# Patient Record
Sex: Female | Born: 1960
Health system: Southern US, Community
[De-identification: ages and names within clinical notes are randomized; demographics above are authoritative.]

## PROBLEM LIST (undated history)

## (undated) DIAGNOSIS — Z4542 Encounter for adjustment and management of neuropacemaker (brain) (peripheral nerve) (spinal cord): Secondary | ICD-10-CM

## (undated) DIAGNOSIS — T7840XA Allergy, unspecified, initial encounter: Secondary | ICD-10-CM

## (undated) DIAGNOSIS — R42 Dizziness and giddiness: Secondary | ICD-10-CM

## (undated) DIAGNOSIS — I10 Essential (primary) hypertension: Secondary | ICD-10-CM

## (undated) DIAGNOSIS — Z972 Presence of dental prosthetic device (complete) (partial): Secondary | ICD-10-CM

## (undated) DIAGNOSIS — K219 Gastro-esophageal reflux disease without esophagitis: Secondary | ICD-10-CM

## (undated) DIAGNOSIS — D649 Anemia, unspecified: Secondary | ICD-10-CM

## (undated) DIAGNOSIS — E119 Type 2 diabetes mellitus without complications: Secondary | ICD-10-CM

## (undated) DIAGNOSIS — F419 Anxiety disorder, unspecified: Secondary | ICD-10-CM

## (undated) DIAGNOSIS — G4730 Sleep apnea, unspecified: Secondary | ICD-10-CM

## (undated) DIAGNOSIS — I2699 Other pulmonary embolism without acute cor pulmonale: Secondary | ICD-10-CM

## (undated) DIAGNOSIS — Z9689 Presence of other specified functional implants: Secondary | ICD-10-CM

## (undated) DIAGNOSIS — F32A Depression, unspecified: Secondary | ICD-10-CM

## (undated) DIAGNOSIS — G4700 Insomnia, unspecified: Secondary | ICD-10-CM

## (undated) DIAGNOSIS — E785 Hyperlipidemia, unspecified: Secondary | ICD-10-CM

## (undated) DIAGNOSIS — I509 Heart failure, unspecified: Secondary | ICD-10-CM

## (undated) DIAGNOSIS — I219 Acute myocardial infarction, unspecified: Secondary | ICD-10-CM

## (undated) DIAGNOSIS — I639 Cerebral infarction, unspecified: Secondary | ICD-10-CM

## (undated) DIAGNOSIS — M1990 Unspecified osteoarthritis, unspecified site: Secondary | ICD-10-CM

## (undated) DIAGNOSIS — K279 Peptic ulcer, site unspecified, unspecified as acute or chronic, without hemorrhage or perforation: Secondary | ICD-10-CM

## (undated) DIAGNOSIS — J449 Chronic obstructive pulmonary disease, unspecified: Secondary | ICD-10-CM

## (undated) HISTORY — PX: REPLACEMENT TOTAL KNEE: SUR1224

## (undated) HISTORY — PX: ABDOMINAL HYSTERECTOMY: SHX81

## (undated) HISTORY — DX: Chronic obstructive pulmonary disease, unspecified: J44.9

## (undated) HISTORY — DX: Gastro-esophageal reflux disease without esophagitis: K21.9

## (undated) HISTORY — DX: Cerebral infarction, unspecified: I63.9

## (undated) HISTORY — DX: Sleep apnea, unspecified: G47.30

## (undated) HISTORY — DX: Acute myocardial infarction, unspecified: I21.9

## (undated) HISTORY — DX: Type 2 diabetes mellitus without complications: E11.9

## (undated) HISTORY — DX: Allergy, unspecified, initial encounter: T78.40XA

## (undated) HISTORY — PX: SPINAL CORD STIMULATOR INSERTION: SHX5378

## (undated) HISTORY — DX: Essential (primary) hypertension: I10

## (undated) HISTORY — PX: HERNIA REPAIR: SHX51

## (undated) HISTORY — PX: GALLBLADDER SURGERY: SHX652

## (undated) HISTORY — DX: Hyperlipidemia, unspecified: E78.5

## (undated) HISTORY — PX: ROTATOR CUFF REPAIR: SHX139

## (undated) HISTORY — PX: GASTRIC BYPASS: SHX52

## (undated) HISTORY — PX: OTHER SURGICAL HISTORY: SHX169

---

## 1993-11-27 HISTORY — PX: JOINT REPLACEMENT: SHX530

## 2000-11-27 DIAGNOSIS — I219 Acute myocardial infarction, unspecified: Secondary | ICD-10-CM

## 2000-11-27 HISTORY — DX: Acute myocardial infarction, unspecified: I21.9

## 2002-11-27 DIAGNOSIS — I639 Cerebral infarction, unspecified: Secondary | ICD-10-CM

## 2002-11-27 HISTORY — DX: Cerebral infarction, unspecified: I63.9

## 2004-11-21 ENCOUNTER — Emergency Department: Payer: Self-pay | Admitting: General Practice

## 2006-05-03 ENCOUNTER — Ambulatory Visit: Payer: Self-pay | Admitting: Pain Medicine

## 2006-05-14 ENCOUNTER — Ambulatory Visit: Payer: Self-pay | Admitting: Pain Medicine

## 2006-05-16 ENCOUNTER — Ambulatory Visit: Payer: Self-pay | Admitting: Pain Medicine

## 2006-06-06 ENCOUNTER — Ambulatory Visit: Payer: Self-pay | Admitting: Pain Medicine

## 2006-06-09 ENCOUNTER — Emergency Department: Payer: Self-pay | Admitting: Internal Medicine

## 2006-06-21 ENCOUNTER — Ambulatory Visit: Payer: Self-pay | Admitting: Pain Medicine

## 2006-06-27 ENCOUNTER — Ambulatory Visit: Payer: Self-pay | Admitting: Pain Medicine

## 2006-07-03 ENCOUNTER — Other Ambulatory Visit: Payer: Self-pay

## 2006-07-10 ENCOUNTER — Inpatient Hospital Stay: Payer: Self-pay | Admitting: Unknown Physician Specialty

## 2006-07-23 ENCOUNTER — Ambulatory Visit: Payer: Self-pay | Admitting: Unknown Physician Specialty

## 2006-07-24 ENCOUNTER — Other Ambulatory Visit: Payer: Self-pay

## 2006-07-25 ENCOUNTER — Inpatient Hospital Stay: Payer: Self-pay | Admitting: Unknown Physician Specialty

## 2006-07-30 ENCOUNTER — Encounter: Payer: Self-pay | Admitting: Unknown Physician Specialty

## 2006-08-07 ENCOUNTER — Inpatient Hospital Stay: Payer: Self-pay | Admitting: Unknown Physician Specialty

## 2006-08-17 ENCOUNTER — Other Ambulatory Visit: Payer: Self-pay

## 2006-08-17 ENCOUNTER — Emergency Department: Payer: Self-pay | Admitting: General Practice

## 2006-08-25 ENCOUNTER — Inpatient Hospital Stay: Payer: Self-pay | Admitting: Unknown Physician Specialty

## 2006-09-12 ENCOUNTER — Encounter: Payer: Self-pay | Admitting: Unknown Physician Specialty

## 2006-09-27 ENCOUNTER — Encounter: Payer: Self-pay | Admitting: Unknown Physician Specialty

## 2006-12-28 ENCOUNTER — Encounter: Payer: Self-pay | Admitting: Unknown Physician Specialty

## 2007-02-16 ENCOUNTER — Emergency Department: Payer: Self-pay | Admitting: Emergency Medicine

## 2007-05-16 ENCOUNTER — Emergency Department: Payer: Self-pay | Admitting: Emergency Medicine

## 2007-06-04 ENCOUNTER — Ambulatory Visit: Payer: Self-pay | Admitting: Unknown Physician Specialty

## 2007-06-04 ENCOUNTER — Other Ambulatory Visit: Payer: Self-pay

## 2007-06-17 ENCOUNTER — Inpatient Hospital Stay: Payer: Self-pay | Admitting: Unknown Physician Specialty

## 2007-08-13 ENCOUNTER — Emergency Department: Payer: Self-pay | Admitting: Emergency Medicine

## 2007-09-03 ENCOUNTER — Emergency Department: Payer: Self-pay | Admitting: Emergency Medicine

## 2007-10-11 ENCOUNTER — Ambulatory Visit: Payer: Self-pay

## 2007-10-26 ENCOUNTER — Emergency Department: Payer: Self-pay | Admitting: Emergency Medicine

## 2007-12-23 ENCOUNTER — Emergency Department: Payer: Self-pay | Admitting: Emergency Medicine

## 2008-03-04 ENCOUNTER — Ambulatory Visit: Payer: Self-pay | Admitting: Urology

## 2008-03-12 ENCOUNTER — Ambulatory Visit: Payer: Self-pay | Admitting: Urology

## 2008-04-06 ENCOUNTER — Other Ambulatory Visit: Payer: Self-pay

## 2008-04-06 ENCOUNTER — Ambulatory Visit: Payer: Self-pay

## 2008-07-08 ENCOUNTER — Other Ambulatory Visit: Payer: Self-pay

## 2008-07-08 ENCOUNTER — Ambulatory Visit: Payer: Self-pay | Admitting: Unknown Physician Specialty

## 2008-07-09 ENCOUNTER — Ambulatory Visit: Payer: Self-pay | Admitting: Unknown Physician Specialty

## 2008-08-16 ENCOUNTER — Emergency Department: Payer: Self-pay

## 2008-08-20 ENCOUNTER — Emergency Department: Payer: Self-pay | Admitting: Emergency Medicine

## 2008-08-21 ENCOUNTER — Emergency Department: Payer: Self-pay | Admitting: Internal Medicine

## 2008-10-09 ENCOUNTER — Ambulatory Visit: Payer: Self-pay | Admitting: Unknown Physician Specialty

## 2008-10-23 ENCOUNTER — Emergency Department: Payer: Self-pay | Admitting: Unknown Physician Specialty

## 2009-01-01 ENCOUNTER — Ambulatory Visit: Payer: Self-pay | Admitting: Unknown Physician Specialty

## 2009-01-15 ENCOUNTER — Ambulatory Visit: Payer: Self-pay | Admitting: Unknown Physician Specialty

## 2009-03-18 ENCOUNTER — Ambulatory Visit: Payer: Self-pay | Admitting: Pain Medicine

## 2009-03-24 ENCOUNTER — Ambulatory Visit: Payer: Self-pay | Admitting: Pain Medicine

## 2009-04-05 ENCOUNTER — Emergency Department: Payer: Self-pay | Admitting: Emergency Medicine

## 2009-04-07 ENCOUNTER — Emergency Department: Payer: Self-pay | Admitting: Emergency Medicine

## 2009-04-22 ENCOUNTER — Ambulatory Visit: Payer: Self-pay | Admitting: Pain Medicine

## 2009-04-27 ENCOUNTER — Emergency Department: Payer: Self-pay | Admitting: Emergency Medicine

## 2009-05-03 ENCOUNTER — Ambulatory Visit: Payer: Self-pay | Admitting: Pain Medicine

## 2009-05-11 ENCOUNTER — Ambulatory Visit: Payer: Self-pay | Admitting: Pain Medicine

## 2009-05-20 ENCOUNTER — Emergency Department: Payer: Self-pay | Admitting: Internal Medicine

## 2009-05-26 ENCOUNTER — Ambulatory Visit: Payer: Self-pay | Admitting: Pain Medicine

## 2009-06-07 ENCOUNTER — Ambulatory Visit: Payer: Self-pay | Admitting: Unknown Physician Specialty

## 2009-06-08 ENCOUNTER — Ambulatory Visit: Payer: Self-pay | Admitting: Pain Medicine

## 2009-06-10 ENCOUNTER — Ambulatory Visit: Payer: Self-pay | Admitting: Unknown Physician Specialty

## 2009-06-19 ENCOUNTER — Emergency Department: Payer: Self-pay | Admitting: Emergency Medicine

## 2009-07-12 ENCOUNTER — Ambulatory Visit: Payer: Self-pay | Admitting: Pain Medicine

## 2009-07-19 ENCOUNTER — Ambulatory Visit: Payer: Self-pay | Admitting: Pain Medicine

## 2009-08-04 ENCOUNTER — Ambulatory Visit: Payer: Self-pay | Admitting: Urology

## 2009-08-05 ENCOUNTER — Emergency Department: Payer: Self-pay | Admitting: Emergency Medicine

## 2009-08-09 ENCOUNTER — Emergency Department: Payer: Self-pay | Admitting: Emergency Medicine

## 2009-08-17 ENCOUNTER — Ambulatory Visit: Payer: Self-pay | Admitting: Pain Medicine

## 2009-08-23 ENCOUNTER — Ambulatory Visit: Payer: Self-pay | Admitting: Pain Medicine

## 2009-09-21 ENCOUNTER — Ambulatory Visit: Payer: Self-pay | Admitting: Pain Medicine

## 2009-09-27 ENCOUNTER — Ambulatory Visit: Payer: Self-pay | Admitting: Pain Medicine

## 2009-09-29 ENCOUNTER — Encounter: Payer: Self-pay | Admitting: Physician Assistant

## 2009-10-27 ENCOUNTER — Encounter: Payer: Self-pay | Admitting: Physician Assistant

## 2009-11-02 ENCOUNTER — Ambulatory Visit: Payer: Self-pay | Admitting: Pain Medicine

## 2009-11-08 ENCOUNTER — Ambulatory Visit: Payer: Self-pay | Admitting: Pain Medicine

## 2009-12-09 ENCOUNTER — Emergency Department: Payer: Self-pay | Admitting: Emergency Medicine

## 2009-12-14 ENCOUNTER — Ambulatory Visit: Payer: Self-pay | Admitting: Pain Medicine

## 2009-12-22 ENCOUNTER — Ambulatory Visit: Payer: Self-pay | Admitting: Geriatric Medicine

## 2009-12-22 ENCOUNTER — Ambulatory Visit: Payer: Self-pay | Admitting: Pain Medicine

## 2010-01-20 ENCOUNTER — Ambulatory Visit: Payer: Self-pay | Admitting: Pain Medicine

## 2010-01-26 ENCOUNTER — Ambulatory Visit: Payer: Self-pay | Admitting: Pain Medicine

## 2010-02-17 ENCOUNTER — Ambulatory Visit: Payer: Self-pay | Admitting: Pain Medicine

## 2010-02-23 ENCOUNTER — Ambulatory Visit: Payer: Self-pay | Admitting: Pain Medicine

## 2010-03-04 ENCOUNTER — Inpatient Hospital Stay: Payer: Self-pay | Admitting: Internal Medicine

## 2010-03-09 ENCOUNTER — Ambulatory Visit: Payer: Self-pay | Admitting: Cardiology

## 2010-03-15 ENCOUNTER — Ambulatory Visit: Payer: Self-pay | Admitting: Pain Medicine

## 2010-03-24 ENCOUNTER — Ambulatory Visit: Payer: Self-pay | Admitting: Gastroenterology

## 2010-04-06 ENCOUNTER — Ambulatory Visit: Payer: Self-pay | Admitting: Unknown Physician Specialty

## 2010-04-14 ENCOUNTER — Ambulatory Visit: Payer: Self-pay | Admitting: Unknown Physician Specialty

## 2010-04-19 ENCOUNTER — Ambulatory Visit: Payer: Self-pay | Admitting: Pain Medicine

## 2010-04-26 ENCOUNTER — Ambulatory Visit: Payer: Self-pay | Admitting: Unknown Physician Specialty

## 2010-05-17 ENCOUNTER — Ambulatory Visit: Payer: Self-pay | Admitting: Pain Medicine

## 2010-05-24 ENCOUNTER — Emergency Department: Payer: Self-pay | Admitting: Emergency Medicine

## 2011-01-06 ENCOUNTER — Ambulatory Visit: Payer: Self-pay | Admitting: Orthopedic Surgery

## 2011-02-27 ENCOUNTER — Emergency Department: Payer: Self-pay | Admitting: Internal Medicine

## 2011-06-11 ENCOUNTER — Emergency Department: Payer: Self-pay | Admitting: Emergency Medicine

## 2011-06-16 ENCOUNTER — Inpatient Hospital Stay: Payer: Self-pay | Admitting: Internal Medicine

## 2011-07-05 ENCOUNTER — Emergency Department: Payer: Self-pay | Admitting: Internal Medicine

## 2011-07-08 ENCOUNTER — Emergency Department: Payer: Self-pay | Admitting: Unknown Physician Specialty

## 2011-07-18 ENCOUNTER — Ambulatory Visit: Payer: Self-pay | Admitting: Unknown Physician Specialty

## 2011-07-25 ENCOUNTER — Inpatient Hospital Stay: Payer: Self-pay | Admitting: Internal Medicine

## 2011-09-16 ENCOUNTER — Emergency Department: Payer: Self-pay | Admitting: *Deleted

## 2011-09-19 ENCOUNTER — Inpatient Hospital Stay: Payer: Self-pay | Admitting: Internal Medicine

## 2011-10-09 ENCOUNTER — Ambulatory Visit: Payer: Self-pay | Admitting: Surgery

## 2011-10-23 ENCOUNTER — Inpatient Hospital Stay: Payer: Self-pay | Admitting: Internal Medicine

## 2011-12-02 ENCOUNTER — Observation Stay: Payer: Self-pay | Admitting: Surgery

## 2011-12-02 LAB — URINALYSIS, COMPLETE
Bilirubin,UR: NEGATIVE
Blood: NEGATIVE
Ketone: NEGATIVE
Leukocyte Esterase: NEGATIVE
Protein: NEGATIVE
RBC,UR: <1 /HPF (ref 0–5)
WBC UR: <1 /HPF (ref 0–5)

## 2011-12-02 LAB — COMPREHENSIVE METABOLIC PANEL
Albumin: 3.3 g/dL — ABNORMAL LOW (ref 3.4–5.0)
Alkaline Phosphatase: 96 U/L (ref 50–136)
Anion Gap: 8 (ref 7–16)
BUN: 15 mg/dL (ref 7–18)
Bilirubin,Total: 0.4 mg/dL (ref 0.2–1.0)
Calcium, Total: 9 mg/dL (ref 8.5–10.1)
Chloride: 109 mmol/L — ABNORMAL HIGH (ref 98–107)
EGFR (African American): >60
Sodium: 144 mmol/L (ref 136–145)
Total Protein: 7 g/dL (ref 6.4–8.2)

## 2011-12-02 LAB — CBC
MCHC: 32.5 g/dL (ref 32.0–36.0)
WBC: 9.2 10*3/uL (ref 3.6–11.0)

## 2011-12-07 LAB — CULTURE, BLOOD (SINGLE)

## 2012-02-16 ENCOUNTER — Emergency Department: Payer: Self-pay | Admitting: Emergency Medicine

## 2012-02-16 LAB — CBC
MCH: 27.9 pg (ref 26.0–34.0)
MCHC: 32.4 g/dL (ref 32.0–36.0)
MCV: 86 fL (ref 80–100)
Platelet: 351 10*3/uL (ref 150–440)
RDW: 16 % — ABNORMAL HIGH (ref 11.5–14.5)
WBC: 7.7 10*3/uL (ref 3.6–11.0)

## 2012-02-16 LAB — RAPID INFLUENZA A&B ANTIGENS

## 2012-02-16 LAB — BASIC METABOLIC PANEL
Calcium, Total: 8.7 mg/dL (ref 8.5–10.1)
Co2: 28 mmol/L (ref 21–32)
EGFR (African American): >60
EGFR (Non-African Amer.): >60
Glucose: 99 mg/dL (ref 65–99)
Potassium: 4.4 mmol/L (ref 3.5–5.1)
Sodium: 140 mmol/L (ref 136–145)

## 2012-02-26 ENCOUNTER — Emergency Department: Payer: Self-pay | Admitting: *Deleted

## 2012-02-26 LAB — CBC
HCT: 33.1 % — ABNORMAL LOW (ref 35.0–47.0)
MCH: 28.3 pg (ref 26.0–34.0)
MCHC: 32.6 g/dL (ref 32.0–36.0)
MCV: 87 fL (ref 80–100)
Platelet: 390 10*3/uL (ref 150–440)
RBC: 3.82 10*6/uL (ref 3.80–5.20)
WBC: 10.2 10*3/uL (ref 3.6–11.0)

## 2012-02-26 LAB — COMPREHENSIVE METABOLIC PANEL
Albumin: 3.1 g/dL — ABNORMAL LOW (ref 3.4–5.0)
Anion Gap: 10 (ref 7–16)
BUN: 15 mg/dL (ref 7–18)
Bilirubin,Total: 0.3 mg/dL (ref 0.2–1.0)
Calcium, Total: 8.6 mg/dL (ref 8.5–10.1)
Co2: 24 mmol/L (ref 21–32)
Creatinine: 0.95 mg/dL (ref 0.60–1.30)
EGFR (African American): >60
EGFR (Non-African Amer.): >60
Glucose: 111 mg/dL — ABNORMAL HIGH (ref 65–99)
Osmolality: 285 (ref 275–301)
SGOT(AST): 19 U/L (ref 15–37)
SGPT (ALT): 15 U/L
Sodium: 142 mmol/L (ref 136–145)
Total Protein: 7.1 g/dL (ref 6.4–8.2)

## 2012-02-26 LAB — URINALYSIS, COMPLETE
Bilirubin,UR: NEGATIVE
Blood: NEGATIVE
Glucose,UR: NEGATIVE mg/dL (ref 0–75)
Leukocyte Esterase: NEGATIVE
Ph: 8 (ref 4.5–8.0)
RBC,UR: 1 /HPF (ref 0–5)
Specific Gravity: 1.019 (ref 1.003–1.030)
Squamous Epithelial: 4
WBC UR: <1 /HPF (ref 0–5)

## 2012-02-26 LAB — CK TOTAL AND CKMB (NOT AT ARMC): CK-MB: <0.5 ng/mL — ABNORMAL LOW (ref 0.5–3.6)

## 2012-05-18 ENCOUNTER — Emergency Department: Payer: Self-pay | Admitting: *Deleted

## 2012-07-13 ENCOUNTER — Emergency Department: Payer: Self-pay | Admitting: Emergency Medicine

## 2012-07-13 LAB — CBC
HCT: 32.3 % — ABNORMAL LOW (ref 35.0–47.0)
HGB: 10.4 g/dL — ABNORMAL LOW (ref 12.0–16.0)
MCH: 27.8 pg (ref 26.0–34.0)
MCV: 87 fL (ref 80–100)
RBC: 3.73 10*6/uL — ABNORMAL LOW (ref 3.80–5.20)
RDW: 15.1 % — ABNORMAL HIGH (ref 11.5–14.5)
WBC: 8.2 10*3/uL (ref 3.6–11.0)

## 2012-07-13 LAB — BASIC METABOLIC PANEL
BUN: 13 mg/dL (ref 7–18)
EGFR (Non-African Amer.): >60
Glucose: 100 mg/dL — ABNORMAL HIGH (ref 65–99)
Osmolality: 287 (ref 275–301)
Potassium: 3.6 mmol/L (ref 3.5–5.1)
Sodium: 144 mmol/L (ref 136–145)

## 2012-07-15 ENCOUNTER — Emergency Department: Payer: Self-pay | Admitting: Emergency Medicine

## 2012-07-15 LAB — COMPREHENSIVE METABOLIC PANEL
Albumin: 3 g/dL — ABNORMAL LOW (ref 3.4–5.0)
Alkaline Phosphatase: 100 U/L (ref 50–136)
Anion Gap: 7 (ref 7–16)
Bilirubin,Total: 0.3 mg/dL (ref 0.2–1.0)
Calcium, Total: 8.8 mg/dL (ref 8.5–10.1)
Chloride: 111 mmol/L — ABNORMAL HIGH (ref 98–107)
Co2: 25 mmol/L (ref 21–32)
Creatinine: 1.06 mg/dL (ref 0.60–1.30)
EGFR (African American): >60
EGFR (Non-African Amer.): 60 — ABNORMAL LOW
Osmolality: 286 (ref 275–301)
SGOT(AST): 28 U/L (ref 15–37)

## 2012-07-15 LAB — LIPASE, BLOOD: Lipase: 88 U/L (ref 73–393)

## 2012-07-15 LAB — CBC
HGB: 10.4 g/dL — ABNORMAL LOW (ref 12.0–16.0)
MCH: 27.3 pg (ref 26.0–34.0)
MCHC: 32.4 g/dL (ref 32.0–36.0)
MCV: 84 fL (ref 80–100)
RBC: 3.8 10*6/uL (ref 3.80–5.20)
RDW: 15.1 % — ABNORMAL HIGH (ref 11.5–14.5)

## 2012-07-15 LAB — URINALYSIS, COMPLETE
Bacteria: NONE SEEN
Bilirubin,UR: NEGATIVE
Blood: NEGATIVE
Nitrite: NEGATIVE
Ph: 5 (ref 4.5–8.0)
RBC,UR: NONE SEEN /HPF (ref 0–5)
Specific Gravity: 1.029 (ref 1.003–1.030)
Squamous Epithelial: 3
WBC UR: 3 /HPF (ref 0–5)

## 2012-07-15 LAB — PREGNANCY, URINE: Pregnancy Test, Urine: NEGATIVE m[IU]/mL

## 2012-08-02 ENCOUNTER — Emergency Department: Payer: Self-pay | Admitting: Emergency Medicine

## 2012-08-02 LAB — DIFFERENTIAL
Basophil #: 0.1 10*3/uL (ref 0.0–0.1)
Basophil %: 1.2 %
Lymphocyte #: 2.7 10*3/uL (ref 1.0–3.6)
Lymphocyte %: 33.1 %
Monocyte %: 8.9 %
Neutrophil #: 4.4 10*3/uL (ref 1.4–6.5)

## 2012-08-02 LAB — URINALYSIS, COMPLETE
Bilirubin,UR: NEGATIVE
Blood: NEGATIVE
Hyaline Cast: 25
Leukocyte Esterase: NEGATIVE
Ph: 5 (ref 4.5–8.0)
Protein: NEGATIVE

## 2012-08-02 LAB — COMPREHENSIVE METABOLIC PANEL
Alkaline Phosphatase: 125 U/L (ref 50–136)
BUN: 19 mg/dL — ABNORMAL HIGH (ref 7–18)
Bilirubin,Total: 0.4 mg/dL (ref 0.2–1.0)
Co2: 28 mmol/L (ref 21–32)
Creatinine: 1.22 mg/dL (ref 0.60–1.30)
Glucose: 111 mg/dL — ABNORMAL HIGH (ref 65–99)
Osmolality: 277 (ref 275–301)
SGOT(AST): 16 U/L (ref 15–37)
SGPT (ALT): 30 U/L (ref 12–78)
Total Protein: 7.3 g/dL (ref 6.4–8.2)

## 2012-08-02 LAB — CBC
HCT: 36.2 % (ref 35.0–47.0)
HGB: 11.7 g/dL — ABNORMAL LOW (ref 12.0–16.0)
MCH: 27.3 pg (ref 26.0–34.0)
MCHC: 32.4 g/dL (ref 32.0–36.0)
RBC: 4.3 10*6/uL (ref 3.80–5.20)

## 2012-08-02 LAB — LIPASE, BLOOD: Lipase: 50 U/L — ABNORMAL LOW (ref 73–393)

## 2012-08-03 LAB — COMPREHENSIVE METABOLIC PANEL
Albumin: 3.3 g/dL — ABNORMAL LOW (ref 3.4–5.0)
Alkaline Phosphatase: 128 U/L (ref 50–136)
Anion Gap: 7 (ref 7–16)
Bilirubin,Total: 0.4 mg/dL (ref 0.2–1.0)
Calcium, Total: 8.9 mg/dL (ref 8.5–10.1)
Glucose: 116 mg/dL — ABNORMAL HIGH (ref 65–99)
SGOT(AST): 19 U/L (ref 15–37)
SGPT (ALT): 23 U/L (ref 12–78)
Sodium: 138 mmol/L (ref 136–145)
Total Protein: 8.1 g/dL (ref 6.4–8.2)

## 2012-08-03 LAB — CBC
HCT: 37.7 % (ref 35.0–47.0)
HGB: 12.3 g/dL (ref 12.0–16.0)
MCHC: 32.7 g/dL (ref 32.0–36.0)
RBC: 4.47 10*6/uL (ref 3.80–5.20)
WBC: 8 10*3/uL (ref 3.6–11.0)

## 2012-08-03 LAB — LIPASE, BLOOD: Lipase: 49 U/L — ABNORMAL LOW (ref 73–393)

## 2012-08-04 ENCOUNTER — Inpatient Hospital Stay: Payer: Self-pay | Admitting: Internal Medicine

## 2012-08-04 LAB — URINALYSIS, COMPLETE
Bilirubin,UR: NEGATIVE
Hyaline Cast: 35
Ketone: NEGATIVE
Leukocyte Esterase: NEGATIVE
Ph: 5 (ref 4.5–8.0)
Protein: NEGATIVE

## 2012-08-04 LAB — BASIC METABOLIC PANEL
Anion Gap: 12 (ref 7–16)
Calcium, Total: 8.1 mg/dL — ABNORMAL LOW (ref 8.5–10.1)
Chloride: 110 mmol/L — ABNORMAL HIGH (ref 98–107)
Co2: 20 mmol/L — ABNORMAL LOW (ref 21–32)
Creatinine: 1.52 mg/dL — ABNORMAL HIGH (ref 0.60–1.30)
Potassium: 4.1 mmol/L (ref 3.5–5.1)
Sodium: 142 mmol/L (ref 136–145)

## 2012-08-04 LAB — MAGNESIUM: Magnesium: 2 mg/dL

## 2012-08-05 LAB — CBC WITH DIFFERENTIAL/PLATELET
Basophil #: 0.1 10*3/uL (ref 0.0–0.1)
Basophil %: 0.8 %
Eosinophil #: 0.3 10*3/uL (ref 0.0–0.7)
Eosinophil %: 4.1 %
HCT: 31.6 % — ABNORMAL LOW (ref 35.0–47.0)
HGB: 10.1 g/dL — ABNORMAL LOW (ref 12.0–16.0)
Lymphocyte #: 2.8 10*3/uL (ref 1.0–3.6)
Lymphocyte %: 39.8 %
MCH: 27.3 pg (ref 26.0–34.0)
MCHC: 31.9 g/dL — ABNORMAL LOW (ref 32.0–36.0)
MCV: 86 fL (ref 80–100)
Monocyte #: 0.6 x10 3/mm (ref 0.2–0.9)
Monocyte %: 8.1 %
Neutrophil #: 3.3 10*3/uL (ref 1.4–6.5)
Neutrophil %: 47.2 %
Platelet: 327 10*3/uL (ref 150–440)
RBC: 3.69 10*6/uL — ABNORMAL LOW (ref 3.80–5.20)
RDW: 15.7 % — ABNORMAL HIGH (ref 11.5–14.5)
WBC: 6.9 10*3/uL (ref 3.6–11.0)

## 2012-08-05 LAB — BASIC METABOLIC PANEL
Anion Gap: 4 — ABNORMAL LOW (ref 7–16)
BUN: 17 mg/dL (ref 7–18)
Calcium, Total: 8.2 mg/dL — ABNORMAL LOW (ref 8.5–10.1)
Chloride: 108 mmol/L — ABNORMAL HIGH (ref 98–107)
Co2: 27 mmol/L (ref 21–32)
Creatinine: 1.17 mg/dL (ref 0.60–1.30)
EGFR (African American): >60
EGFR (Non-African Amer.): 53 — ABNORMAL LOW
Glucose: 104 mg/dL — ABNORMAL HIGH (ref 65–99)
Osmolality: 279 (ref 275–301)
Potassium: 4.1 mmol/L (ref 3.5–5.1)
Sodium: 139 mmol/L (ref 136–145)

## 2012-08-05 LAB — AMYLASE: Amylase: 45 U/L (ref 25–115)

## 2012-08-07 LAB — BASIC METABOLIC PANEL
Anion Gap: 8 (ref 7–16)
BUN: 4 mg/dL — ABNORMAL LOW (ref 7–18)
Calcium, Total: 8.3 mg/dL — ABNORMAL LOW (ref 8.5–10.1)
Chloride: 111 mmol/L — ABNORMAL HIGH (ref 98–107)
Co2: 24 mmol/L (ref 21–32)
Creatinine: 0.75 mg/dL (ref 0.60–1.30)
EGFR (African American): >60
EGFR (Non-African Amer.): >60
Glucose: 95 mg/dL (ref 65–99)
Osmolality: 282 (ref 275–301)
Potassium: 4 mmol/L (ref 3.5–5.1)
Sodium: 143 mmol/L (ref 136–145)

## 2012-08-07 LAB — CBC WITH DIFFERENTIAL/PLATELET
Basophil #: 0.1 10*3/uL (ref 0.0–0.1)
Basophil %: 1.1 %
Eosinophil #: 0.3 10*3/uL (ref 0.0–0.7)
Eosinophil %: 5.3 %
HCT: 29.4 % — ABNORMAL LOW (ref 35.0–47.0)
HGB: 9.6 g/dL — ABNORMAL LOW (ref 12.0–16.0)
Lymphocyte #: 2.7 10*3/uL (ref 1.0–3.6)
Lymphocyte %: 50.9 %
MCH: 27.4 pg (ref 26.0–34.0)
MCHC: 32.7 g/dL (ref 32.0–36.0)
MCV: 84 fL (ref 80–100)
Monocyte #: 0.6 x10 3/mm (ref 0.2–0.9)
Monocyte %: 10.6 %
Neutrophil #: 1.7 10*3/uL (ref 1.4–6.5)
Neutrophil %: 32.1 %
Platelet: 326 10*3/uL (ref 150–440)
RBC: 3.51 10*6/uL — ABNORMAL LOW (ref 3.80–5.20)
RDW: 15.2 % — ABNORMAL HIGH (ref 11.5–14.5)
WBC: 5.3 10*3/uL (ref 3.6–11.0)

## 2012-08-12 LAB — PATHOLOGY REPORT

## 2012-09-23 ENCOUNTER — Emergency Department: Payer: Self-pay | Admitting: Emergency Medicine

## 2012-09-23 LAB — URINALYSIS, COMPLETE
Bilirubin,UR: NEGATIVE
Blood: NEGATIVE
Glucose,UR: NEGATIVE mg/dL (ref 0–75)
Ketone: NEGATIVE
Leukocyte Esterase: NEGATIVE
Nitrite: NEGATIVE
Specific Gravity: 1.021 (ref 1.003–1.030)
Squamous Epithelial: 3
WBC UR: <1 /HPF (ref 0–5)

## 2012-09-23 LAB — COMPREHENSIVE METABOLIC PANEL
Albumin: 3.5 g/dL (ref 3.4–5.0)
Anion Gap: 7 (ref 7–16)
BUN: 20 mg/dL — ABNORMAL HIGH (ref 7–18)
Calcium, Total: 9.1 mg/dL (ref 8.5–10.1)
Chloride: 105 mmol/L (ref 98–107)
Creatinine: 0.98 mg/dL (ref 0.60–1.30)
EGFR (African American): >60
Glucose: 113 mg/dL — ABNORMAL HIGH (ref 65–99)
Osmolality: 281 (ref 275–301)
Potassium: 4.6 mmol/L (ref 3.5–5.1)
SGOT(AST): 27 U/L (ref 15–37)
Sodium: 139 mmol/L (ref 136–145)
Total Protein: 7.8 g/dL (ref 6.4–8.2)

## 2012-09-23 LAB — CBC
MCHC: 32.2 g/dL (ref 32.0–36.0)
MCV: 84 fL (ref 80–100)
Platelet: 412 10*3/uL (ref 150–440)
RDW: 16 % — ABNORMAL HIGH (ref 11.5–14.5)
WBC: 10 10*3/uL (ref 3.6–11.0)

## 2012-09-23 LAB — LIPASE, BLOOD: Lipase: 91 U/L (ref 73–393)

## 2012-12-21 ENCOUNTER — Emergency Department: Payer: Self-pay | Admitting: Emergency Medicine

## 2012-12-21 LAB — COMPREHENSIVE METABOLIC PANEL
Albumin: 3.3 g/dL — ABNORMAL LOW (ref 3.4–5.0)
Anion Gap: 9 (ref 7–16)
BUN: 19 mg/dL — ABNORMAL HIGH (ref 7–18)
Calcium, Total: 8.9 mg/dL (ref 8.5–10.1)
Chloride: 107 mmol/L (ref 98–107)
Co2: 25 mmol/L (ref 21–32)
Creatinine: 1.02 mg/dL (ref 0.60–1.30)
EGFR (Non-African Amer.): >60
Glucose: 106 mg/dL — ABNORMAL HIGH (ref 65–99)
Osmolality: 284 (ref 275–301)
SGOT(AST): 18 U/L (ref 15–37)
Sodium: 141 mmol/L (ref 136–145)

## 2012-12-21 LAB — URINALYSIS, COMPLETE
Bacteria: NONE SEEN
Bilirubin,UR: NEGATIVE
Blood: NEGATIVE
Glucose,UR: NEGATIVE mg/dL (ref 0–75)
Nitrite: NEGATIVE
Ph: 7 (ref 4.5–8.0)
Protein: NEGATIVE
RBC,UR: <1 /HPF (ref 0–5)
WBC UR: 1 /HPF (ref 0–5)

## 2012-12-21 LAB — CK TOTAL AND CKMB (NOT AT ARMC)
CK, Total: 124 U/L (ref 21–215)
CK-MB: 0.7 ng/mL (ref 0.5–3.6)

## 2012-12-21 LAB — CBC
HGB: 11.6 g/dL — ABNORMAL LOW (ref 12.0–16.0)
MCV: 85 fL (ref 80–100)
Platelet: 346 10*3/uL (ref 150–440)
RBC: 4.26 10*6/uL (ref 3.80–5.20)
RDW: 16.1 % — ABNORMAL HIGH (ref 11.5–14.5)

## 2012-12-21 LAB — MAGNESIUM: Magnesium: 1.7 mg/dL — ABNORMAL LOW

## 2012-12-21 LAB — PHOSPHORUS: Phosphorus: 2.7 mg/dL (ref 2.5–4.9)

## 2012-12-21 LAB — TROPONIN I: Troponin-I: <0.02 ng/mL

## 2013-01-15 ENCOUNTER — Ambulatory Visit: Payer: Self-pay | Admitting: Gastroenterology

## 2013-02-18 ENCOUNTER — Emergency Department: Payer: Self-pay | Admitting: Emergency Medicine

## 2013-02-18 LAB — URINALYSIS, COMPLETE
Bilirubin,UR: NEGATIVE
Blood: NEGATIVE
Glucose,UR: NEGATIVE mg/dL (ref 0–75)
Ketone: NEGATIVE
Nitrite: NEGATIVE
Ph: 6 (ref 4.5–8.0)
Protein: NEGATIVE
RBC,UR: 8 /HPF (ref 0–5)
Specific Gravity: 1.018 (ref 1.003–1.030)
Squamous Epithelial: 17
WBC UR: 35 /HPF (ref 0–5)

## 2013-02-18 LAB — COMPREHENSIVE METABOLIC PANEL
Alkaline Phosphatase: 88 U/L (ref 50–136)
Anion Gap: 5 — ABNORMAL LOW (ref 7–16)
BUN: 25 mg/dL — ABNORMAL HIGH (ref 7–18)
Calcium, Total: 8.7 mg/dL (ref 8.5–10.1)
Chloride: 107 mmol/L (ref 98–107)
Creatinine: 1.08 mg/dL (ref 0.60–1.30)
EGFR (African American): >60
EGFR (Non-African Amer.): 59 — ABNORMAL LOW
Potassium: 3.9 mmol/L (ref 3.5–5.1)
SGOT(AST): 15 U/L (ref 15–37)
SGPT (ALT): 14 U/L (ref 12–78)
Sodium: 139 mmol/L (ref 136–145)
Total Protein: 6.5 g/dL (ref 6.4–8.2)

## 2013-02-18 LAB — CBC
MCH: 27.1 pg (ref 26.0–34.0)
MCHC: 32.1 g/dL (ref 32.0–36.0)
MCV: 85 fL (ref 80–100)
Platelet: 505 10*3/uL — ABNORMAL HIGH (ref 150–440)
RDW: 15.3 % — ABNORMAL HIGH (ref 11.5–14.5)
WBC: 13 10*3/uL — ABNORMAL HIGH (ref 3.6–11.0)

## 2013-02-18 LAB — PROTIME-INR: Prothrombin Time: 13.3 secs (ref 11.5–14.7)

## 2013-02-18 LAB — MAGNESIUM: Magnesium: 1.5 mg/dL — ABNORMAL LOW

## 2013-03-18 DIAGNOSIS — R3915 Urgency of urination: Secondary | ICD-10-CM | POA: Insufficient documentation

## 2013-03-18 DIAGNOSIS — N3941 Urge incontinence: Secondary | ICD-10-CM | POA: Insufficient documentation

## 2013-03-18 DIAGNOSIS — R300 Dysuria: Secondary | ICD-10-CM | POA: Insufficient documentation

## 2013-04-01 DIAGNOSIS — N949 Unspecified condition associated with female genital organs and menstrual cycle: Secondary | ICD-10-CM | POA: Insufficient documentation

## 2013-04-18 DIAGNOSIS — T859XXA Unspecified complication of internal prosthetic device, implant and graft, initial encounter: Secondary | ICD-10-CM | POA: Insufficient documentation

## 2013-05-05 ENCOUNTER — Ambulatory Visit: Payer: Self-pay | Admitting: Cardiovascular Disease

## 2013-05-05 LAB — CBC WITH DIFFERENTIAL/PLATELET
Basophil %: 0.9 %
Eosinophil #: 0.3 10*3/uL (ref 0.0–0.7)
Eosinophil %: 4.4 %
HCT: 32.6 % — ABNORMAL LOW (ref 35.0–47.0)
Lymphocyte #: 3.5 10*3/uL (ref 1.0–3.6)
Lymphocyte %: 55.2 %
MCHC: 32.9 g/dL (ref 32.0–36.0)
MCV: 84 fL (ref 80–100)
Monocyte %: 5.8 %
Neutrophil #: 2.1 10*3/uL (ref 1.4–6.5)
Neutrophil %: 33.7 %
RBC: 3.9 10*6/uL (ref 3.80–5.20)
RDW: 16.2 % — ABNORMAL HIGH (ref 11.5–14.5)

## 2013-05-05 LAB — BASIC METABOLIC PANEL
Anion Gap: 6 — ABNORMAL LOW (ref 7–16)
BUN: 25 mg/dL — ABNORMAL HIGH (ref 7–18)
Calcium, Total: 9 mg/dL (ref 8.5–10.1)
Chloride: 109 mmol/L — ABNORMAL HIGH (ref 98–107)
Co2: 25 mmol/L (ref 21–32)
Creatinine: 1.4 mg/dL — ABNORMAL HIGH (ref 0.60–1.30)
Glucose: 98 mg/dL (ref 65–99)
Osmolality: 284 (ref 275–301)

## 2013-07-30 ENCOUNTER — Emergency Department: Payer: Self-pay | Admitting: Emergency Medicine

## 2013-07-30 LAB — DRUG SCREEN, URINE
Amphetamines, Ur Screen: NEGATIVE (ref ?–1000)
Benzodiazepine, Ur Scrn: POSITIVE (ref ?–200)
Cannabinoid 50 Ng, Ur ~~LOC~~: NEGATIVE (ref ?–50)
MDMA (Ecstasy)Ur Screen: NEGATIVE (ref ?–500)
Methadone, Ur Screen: NEGATIVE (ref ?–300)
Opiate, Ur Screen: NEGATIVE (ref ?–300)
Phencyclidine (PCP) Ur S: NEGATIVE (ref ?–25)

## 2013-07-30 LAB — URINALYSIS, COMPLETE
Bilirubin,UR: NEGATIVE
Blood: NEGATIVE
Ketone: NEGATIVE
Protein: NEGATIVE
Specific Gravity: 1.01 (ref 1.003–1.030)
Squamous Epithelial: 2
WBC UR: 5 /HPF (ref 0–5)

## 2013-09-07 ENCOUNTER — Emergency Department: Payer: Self-pay | Admitting: Emergency Medicine

## 2013-10-12 ENCOUNTER — Emergency Department: Payer: Self-pay | Admitting: Emergency Medicine

## 2013-11-28 ENCOUNTER — Emergency Department: Payer: Self-pay | Admitting: Emergency Medicine

## 2013-11-28 LAB — PROTIME-INR
INR: 1
PROTHROMBIN TIME: 13 s (ref 11.5–14.7)

## 2013-11-28 LAB — URINALYSIS, COMPLETE
BACTERIA: NONE SEEN
BLOOD: NEGATIVE
Bilirubin,UR: NEGATIVE
GLUCOSE, UR: NEGATIVE mg/dL (ref 0–75)
Hyaline Cast: 1
Ketone: NEGATIVE
LEUKOCYTE ESTERASE: NEGATIVE
NITRITE: NEGATIVE
PROTEIN: NEGATIVE
Ph: 5 (ref 4.5–8.0)
SPECIFIC GRAVITY: 1.019 (ref 1.003–1.030)
Squamous Epithelial: 1

## 2013-11-28 LAB — CBC WITH DIFFERENTIAL/PLATELET
BASOS ABS: 0.1 10*3/uL (ref 0.0–0.1)
Basophil %: 0.9 %
Eosinophil #: 0.2 10*3/uL (ref 0.0–0.7)
Eosinophil %: 3 %
HCT: 30 % — ABNORMAL LOW (ref 35.0–47.0)
HGB: 9.6 g/dL — ABNORMAL LOW (ref 12.0–16.0)
LYMPHS ABS: 2.4 10*3/uL (ref 1.0–3.6)
Lymphocyte %: 35.2 %
MCH: 26.9 pg (ref 26.0–34.0)
MCHC: 31.9 g/dL — ABNORMAL LOW (ref 32.0–36.0)
MCV: 84 fL (ref 80–100)
MONO ABS: 0.5 x10 3/mm (ref 0.2–0.9)
Monocyte %: 7.1 %
Neutrophil #: 3.6 10*3/uL (ref 1.4–6.5)
Neutrophil %: 53.8 %
Platelet: 414 10*3/uL (ref 150–440)
RBC: 3.55 10*6/uL — ABNORMAL LOW (ref 3.80–5.20)
RDW: 15.9 % — AB (ref 11.5–14.5)
WBC: 6.7 10*3/uL (ref 3.6–11.0)

## 2013-11-28 LAB — COMPREHENSIVE METABOLIC PANEL
ALK PHOS: 91 U/L
ANION GAP: 6 — AB (ref 7–16)
Albumin: 3.1 g/dL — ABNORMAL LOW (ref 3.4–5.0)
BUN: 30 mg/dL — AB (ref 7–18)
Bilirubin,Total: 0.2 mg/dL (ref 0.2–1.0)
CALCIUM: 8.3 mg/dL — AB (ref 8.5–10.1)
CO2: 25 mmol/L (ref 21–32)
Chloride: 110 mmol/L — ABNORMAL HIGH (ref 98–107)
Creatinine: 1.52 mg/dL — ABNORMAL HIGH (ref 0.60–1.30)
EGFR (African American): 45 — ABNORMAL LOW
EGFR (Non-African Amer.): 38 — ABNORMAL LOW
Glucose: 134 mg/dL — ABNORMAL HIGH (ref 65–99)
OSMOLALITY: 289 (ref 275–301)
POTASSIUM: 3.9 mmol/L (ref 3.5–5.1)
SGOT(AST): 15 U/L (ref 15–37)
SGPT (ALT): 25 U/L (ref 12–78)
Sodium: 141 mmol/L (ref 136–145)
Total Protein: 6.4 g/dL (ref 6.4–8.2)

## 2013-11-28 LAB — TROPONIN I: Troponin-I: <0.02 ng/mL

## 2013-11-28 LAB — CK TOTAL AND CKMB (NOT AT ARMC)
CK, Total: 127 U/L (ref 21–215)
CK-MB: 0.9 ng/mL (ref 0.5–3.6)

## 2013-12-18 ENCOUNTER — Ambulatory Visit: Payer: Self-pay | Admitting: Internal Medicine

## 2014-06-15 ENCOUNTER — Ambulatory Visit: Payer: Self-pay | Admitting: Gastroenterology

## 2014-06-17 LAB — PATHOLOGY REPORT

## 2014-06-29 ENCOUNTER — Inpatient Hospital Stay: Payer: Self-pay | Admitting: Internal Medicine

## 2014-06-29 LAB — COMPREHENSIVE METABOLIC PANEL
ALT: 27 U/L
AST: 27 U/L (ref 15–37)
Albumin: 3 g/dL — ABNORMAL LOW (ref 3.4–5.0)
Alkaline Phosphatase: 115 U/L
Anion Gap: 5 — ABNORMAL LOW (ref 7–16)
BILIRUBIN TOTAL: 0.2 mg/dL (ref 0.2–1.0)
BUN: 71 mg/dL — AB (ref 7–18)
CHLORIDE: 115 mmol/L — AB (ref 98–107)
Calcium, Total: 8.5 mg/dL (ref 8.5–10.1)
Co2: 21 mmol/L (ref 21–32)
Creatinine: 5.05 mg/dL — ABNORMAL HIGH (ref 0.60–1.30)
EGFR (African American): 10 — ABNORMAL LOW
EGFR (Non-African Amer.): 9 — ABNORMAL LOW
GLUCOSE: 113 mg/dL — AB (ref 65–99)
Osmolality: 303 (ref 275–301)
POTASSIUM: 4.2 mmol/L (ref 3.5–5.1)
Sodium: 141 mmol/L (ref 136–145)
Total Protein: 7.5 g/dL (ref 6.4–8.2)

## 2014-06-29 LAB — URINALYSIS, COMPLETE
RBC,UR: 1 /HPF (ref 0–5)
Specific Gravity: 1.025 (ref 1.003–1.030)

## 2014-06-29 LAB — CBC
HCT: 28.1 % — AB (ref 35.0–47.0)
HGB: 8.9 g/dL — ABNORMAL LOW (ref 12.0–16.0)
MCH: 26 pg (ref 26.0–34.0)
MCHC: 31.7 g/dL — ABNORMAL LOW (ref 32.0–36.0)
MCV: 82 fL (ref 80–100)
PLATELETS: 369 10*3/uL (ref 150–440)
RBC: 3.42 10*6/uL — ABNORMAL LOW (ref 3.80–5.20)
RDW: 20.1 % — ABNORMAL HIGH (ref 11.5–14.5)
WBC: 9.2 10*3/uL (ref 3.6–11.0)

## 2014-06-29 LAB — CK TOTAL AND CKMB (NOT AT ARMC)
CK, Total: 342 U/L — ABNORMAL HIGH
CK, Total: 296 U/L — ABNORMAL HIGH
CK-MB: 5.7 ng/mL — ABNORMAL HIGH (ref 0.5–3.6)
CK-MB: 4.3 ng/mL — ABNORMAL HIGH (ref 0.5–3.6)

## 2014-06-29 LAB — DRUG SCREEN, URINE

## 2014-06-29 LAB — TROPONIN I: Troponin-I: <0.02 ng/mL

## 2014-06-29 LAB — SODIUM, URINE, RANDOM: Sodium, Urine Random: 86 mmol/L (ref 20–110)

## 2014-06-29 LAB — CREATININE, URINE, RANDOM: Creatinine, Urine Random: 104.1 mg/dL (ref 30.0–125.0)

## 2014-06-30 LAB — CBC WITH DIFFERENTIAL/PLATELET
Basophil #: 0.1 10*3/uL (ref 0.0–0.1)
Basophil %: 0.6 %
Eosinophil #: 0.2 10*3/uL (ref 0.0–0.7)
Eosinophil %: 1.9 %
HCT: 30.4 % — ABNORMAL LOW (ref 35.0–47.0)
HGB: 9.5 g/dL — ABNORMAL LOW (ref 12.0–16.0)
Lymphocyte #: 2.5 10*3/uL (ref 1.0–3.6)
Lymphocyte %: 28.4 %
MCH: 25.7 pg — ABNORMAL LOW (ref 26.0–34.0)
MCHC: 31.3 g/dL — ABNORMAL LOW (ref 32.0–36.0)
MCV: 82 fL (ref 80–100)
Monocyte #: 0.8 x10 3/mm (ref 0.2–0.9)
Monocyte %: 8.9 %
Neutrophil #: 5.3 10*3/uL (ref 1.4–6.5)
Neutrophil %: 60.2 %
Platelet: 412 10*3/uL (ref 150–440)
RBC: 3.7 10*6/uL — ABNORMAL LOW (ref 3.80–5.20)
RDW: 19.8 % — ABNORMAL HIGH (ref 11.5–14.5)
WBC: 8.8 10*3/uL (ref 3.6–11.0)

## 2014-06-30 LAB — COMPREHENSIVE METABOLIC PANEL
Albumin: 2.8 g/dL — ABNORMAL LOW (ref 3.4–5.0)
Alkaline Phosphatase: 114 U/L
Anion Gap: 9 (ref 7–16)
BUN: 36 mg/dL — ABNORMAL HIGH (ref 7–18)
Bilirubin,Total: 0.3 mg/dL (ref 0.2–1.0)
Calcium, Total: 8.8 mg/dL (ref 8.5–10.1)
Chloride: 114 mmol/L — ABNORMAL HIGH (ref 98–107)
Co2: 23 mmol/L (ref 21–32)
Creatinine: 1.6 mg/dL — ABNORMAL HIGH (ref 0.60–1.30)
EGFR (African American): 42 — ABNORMAL LOW
EGFR (Non-African Amer.): 36 — ABNORMAL LOW
Glucose: 111 mg/dL — ABNORMAL HIGH (ref 65–99)
Osmolality: 300 (ref 275–301)
Potassium: 3.8 mmol/L (ref 3.5–5.1)
SGOT(AST): 18 U/L (ref 15–37)
SGPT (ALT): 25 U/L
Sodium: 146 mmol/L — ABNORMAL HIGH (ref 136–145)
Total Protein: 7.2 g/dL (ref 6.4–8.2)

## 2014-07-01 LAB — COMPREHENSIVE METABOLIC PANEL
ALT: 22 U/L
Albumin: 2.5 g/dL — ABNORMAL LOW (ref 3.4–5.0)
Alkaline Phosphatase: 107 U/L
Anion Gap: 10 (ref 7–16)
BILIRUBIN TOTAL: 0.7 mg/dL (ref 0.2–1.0)
BUN: 18 mg/dL (ref 7–18)
CO2: 16 mmol/L — AB (ref 21–32)
Calcium, Total: 8.7 mg/dL (ref 8.5–10.1)
Chloride: 119 mmol/L — ABNORMAL HIGH (ref 98–107)
Creatinine: 1.22 mg/dL (ref 0.60–1.30)
EGFR (African American): 58 — ABNORMAL LOW
EGFR (Non-African Amer.): 50 — ABNORMAL LOW
Glucose: 126 mg/dL — ABNORMAL HIGH (ref 65–99)
OSMOLALITY: 292 (ref 275–301)
Potassium: 5.1 mmol/L (ref 3.5–5.1)
SGOT(AST): 48 U/L — ABNORMAL HIGH (ref 15–37)
SODIUM: 145 mmol/L (ref 136–145)
Total Protein: 7 g/dL (ref 6.4–8.2)

## 2014-07-01 LAB — PROTEIN / CREATININE RATIO, URINE
Creatinine, Urine: 81.2 mg/dL (ref 30.0–125.0)
PROTEIN, RANDOM URINE: 51 mg/dL — AB (ref 0–12)
Protein/Creat. Ratio: 628 mg/gCREAT — ABNORMAL HIGH (ref 0–200)

## 2014-07-01 LAB — PROTEIN ELECTROPHORESIS(ARMC)

## 2014-07-04 LAB — URINE CULTURE

## 2014-07-04 LAB — UR PROT ELECTROPHORESIS, URINE RANDOM

## 2014-08-04 ENCOUNTER — Emergency Department: Payer: Self-pay | Admitting: Emergency Medicine

## 2014-10-13 DIAGNOSIS — E881 Lipodystrophy, not elsewhere classified: Secondary | ICD-10-CM | POA: Insufficient documentation

## 2015-03-16 NOTE — Consult Note (Signed)
Chief Complaint:   Subjective/Chief Complaint EGD showed scattered jejunal ulcers. Biospies obtained.  Recommmendations: IBD panel. Carafate. Will advance diet and follow.   Electronic Signatures: Jill Side (MD)  (Signed 11-Sep-13 11:39)  Authored: Chief Complaint   Last Updated: 11-Sep-13 11:39 by Jill Side (MD)

## 2015-03-16 NOTE — Discharge Summary (Signed)
PATIENT NAME:  Lindsey [name], Lindsey [name] MR#:  [id number] DATE OF BIRTH:  1961-04-05  DATE OF ADMISSION:  08/04/2012 DATE OF DISCHARGE:  08/10/2012  DISCHARGE DIAGNOSES:  1. Gastroenteritis.  2. Jejunal ulcers.   SECONDARY DIAGNOSES: 1. Morbid obesity. 2. Chronic pain.   PROCEDURE PERFORMED: Upper GI endoscopy.   CONSULTATION: Gastroenterology, Dr. Jill Side.   DISCHARGE MEDICATIONS: Patient to resume her medications except for Percocet.   ADDITIONAL MEDICATION PRESCRIBED:  1. Dilaudid 2 mg p.r.n. every four hours. 2. Carafate 1 gram 4 times a day. 3. Bentyl 20 mg t.i.d. p.r.n.   HOSPITAL COURSE: This lady was admitted through the Emergency Room with complaint of nausea, vomiting, diarrhea times several weeks and upper abdominal pain. Please refer to the history and physical for full details. Patient underwent a CT scan in the ED which revealed focal enteritis and was admitted for further management. Patient was unable to tolerate any p.o. intake initially. She was started on Carafate and her analgesics were adjusted to Dilaudid with some symptomatic improvement. She subsequently underwent upper GI endoscopy by Dr. Jill Side, which revealed jejunal ulceration suspicious for possible inflammatory bowel disease. Biopsies were taken and are pending at the time of this dictation. Her diet was gradually advanced which she tolerated quite well. Bentyl was added to her regimen with almost complete resolution of her pain. Patient will be discharged to home in satisfactory condition.   DIET: Low sodium, normal consistency.   ACTIVITY: No restrictions.   FOLLOW UP: Follow up with Dr. Dionne Milo in two weeks, with Dr. Elijio Miles in 1 to 2 weeks.   DISCHARGE PROCESS TIME SPENT: 32 minutes.  ____________________________ Venetia Maxon Elijio Miles, MD sat:cms D: 08/10/2012 12:16:31 ET T: 08/10/2012 14:39:17 ET JOB#: [id number]  cc: Alfredia Ferguson A. Elijio Miles, MD, <Dictator> Veverly Fells  MD ELECTRONICALLY SIGNED 08/16/2012 9:26

## 2015-03-16 NOTE — Consult Note (Signed)
Chief Complaint:   Subjective/Chief Complaint Feels better. Tolerating regular diet. No vomiting. One soft BM today.   VITAL SIGNS/ANCILLARY NOTES: **Vital Signs.:   14-Sep-13 09:15   Pulse Pulse [id number]   Systolic BP Systolic BP [id number]   Diastolic BP (mmHg) Diastolic BP (mmHg) 90   Mean BP 104   Brief Assessment:   Additional Physical Exam Abdomen is soft and benign.   Assessment/Plan:  Assessment/Plan:   Assessment Chronic pain syndrome. Nausea and vomiting. Resolved. No vomiting or diarrhea for 4 days. Tolerating regular diet. Jejunal ulcers. Biopsies and IND panel pending.    Plan May go home when OK with primary team on PPI, Carafate and Bentyl 20 mg TID PRN. Follow up with me in 2 weeks (order written). Will sign off. Please call me if needed.   Electronic Signatures: Jill Side (MD)  (Signed 14-Sep-13 10:26)  Authored: Chief Complaint, VITAL SIGNS/ANCILLARY NOTES, Brief Assessment, Assessment/Plan   Last Updated: 14-Sep-13 10:26 by Jill Side (MD)

## 2015-03-16 NOTE — Consult Note (Signed)
PATIENT NAME:  Lindsey [name], Lindsey [name] MR#:  [id number] DATE OF BIRTH:  June 25, 1961  DATE OF CONSULTATION:  08/07/2012  REFERRING PHYSICIAN:  Cletis Athens, MD  CONSULTING PHYSICIAN:  Jill Side, MD  REASON FOR CONSULTATION: Abdominal pain, nausea, vomiting, and diarrhea.   HISTORY OF PRESENT ILLNESS: The patient is a 54 year old African American female with history of diabetes, hypertension, coronary artery disease, gastric bypass surgery. The patient was admitted three days ago with six days' history of nausea, vomiting, and diarrhea. CT scan of the abdomen and pelvis was quite unremarkable except for questionable enteritis versus collapsed small intestinal loop. White cell count was normal. She has been afebrile. She was evaluated yesterday on the request of Dr. Elijio Miles. On further questioning, it appears that she has not had any bowel movement for the last three days since she was in the hospital. She has not had any vomiting either. The patient is mainly complaining of dyspeptic symptoms with some burning in the retrosternal area as well as some vague upper abdominal discomfort after meals. As mentioned above, there is no further vomiting or diarrhea for the last 72 hours.   PAST MEDICAL HISTORY:  1. History of cerebrovascular accident.  2. Coronary artery disease. 3. Hypertension. 4. Diabetes. 5. Depression. 6. Chronic pain syndrome. 7. Morbid obesity.  8. Obstructive sleep apnea.  9. History of gastric bypass surgery. 10. History of transient ischemic attacks in the past.   PAST SURGICAL HISTORY: As above.  1. History of cholecystectomy.  2. Left knee replacement.  3. Carpal tunnel release. 4. Spinal stimulator placement. 5. Multiple back surgeries.   SOCIAL HISTORY: She does not smoke or drink.   ALLERGIES: Lodine, iodine and contrast dye.   FAMILY HISTORY: Positive for diabetes and cardiovascular disease.   REVIEW OF SYSTEMS: Unremarkable except for what is mentioned in the  History of Present Illness.   HOME MEDICATIONS: Zofran, Zocor, Percocet, Oxycodone, oxybutynin, Nexium, morphine, Lopressor,  hydroxyzine, folic acid, cyclobenzaprine, calcium, Benicar, Xanax and Advair.   PHYSICAL EXAMINATION:  GENERAL: Morbidly obese female.   VITAL SIGNS: Afebrile. Vitals are stable. She does not appear to be in any acute distress.   SKIN: Unremarkable.   HEENT: Examination is unremarkable as well. No jaundice was noted.   NECK: Veins are flat.   LUNGS: Grossly clear to auscultation bilaterally.   CARDIOVASCULAR: Regular rate and rhythm. No gallops or murmur.   ABDOMEN: She significantly obese. Abdominal examination showed mild epigastric tenderness. No other significant abnormalities such as hepatosplenomegaly or ascites.   NEUROLOGIC: Examination appears to be unremarkable.   LABORATORY, DIAGNOSTIC AND RADIOLOGICAL DATA: White cell count has been normal since admission. Hemoglobin was 11.7 on admission and has dropped down to 9.6 as of today. Liver enzymes are normal. Electrolytes, BUN, creatinine, and lipase are all normal. CT scan as above. Urinalysis is unremarkable.   ASSESSMENT AND PLAN: The patient is with history of nausea, vomiting, and diarrhea, although all of that has resolved and most likely is consistent with viral gastroenteritis. CT findings are most likely secondary to collapse of small bowel rather than true small bowel pathology. The patient is asymptomatic except for retrosternal burning, some odynophagia, and mild dyspeptic symptoms which appear to be chronic. The possibility of Candida esophagitis, reflux esophagitis, gastritis or anastomotic ulcers is there. The patient also appears to be anemic. Her hemoglobin is about 9.6. The patient had a colonoscopy about a year ago which was unremarkable. An upper GI endoscopy about a year ago showed prior gastric bypass  surgery but was otherwise unremarkable. There are no signs of active gastrointestinal  blood loss, and her anemia is most likely secondary to poor iron absorption due to gastric bypass surgery.  RECOMMENDATIONS: We will proceed with an upper GI endoscopy today. Further recommendations to follow.   Thank you so much Dr. Lavera Guise and Dr. Elijio Miles for involving me in the care of [name].   ____________________________ Jill Side, MD si:cbb D: 08/07/2012 09:52:33 ET T: 08/07/2012 10:13:55 ET JOB#: [id number]  cc: Jill Side, MD, <Dictator> Cletis Athens, MD Alfredia Ferguson A. Elijio Miles, MD Jill Side MD ELECTRONICALLY SIGNED 08/07/2012 11:03

## 2015-03-16 NOTE — Consult Note (Signed)
Chief Complaint:   Subjective/Chief Complaint Still c/o abdominal pain. Appears comfortable. No vomiting or diarrhea.   VITAL SIGNS/ANCILLARY NOTES: **Vital Signs.:   13-Sep-13 13:26   Vital Signs Type Routine   Temperature Temperature (F) 97.4   Celsius 36.3   Temperature Source Oral   Pulse Pulse 82   Respirations Respirations 18   Systolic BP Systolic BP 99991111   Diastolic BP (mmHg) Diastolic BP (mmHg) 78   Mean BP 90   Pulse Ox % Pulse Ox % 99   Pulse Ox Activity Level  At rest   Oxygen Delivery Room Air/ 21 %   Brief Assessment:   Additional Physical Exam Abdomen without rebound or guarding.   Assessment/Plan:  Assessment/Plan:   Assessment Chronic abdominal pain some of which is probably functional and related to chronic pain syndrome. No alarm symptoms such as fever, diarrhea or vomiting. IBD panel and biopsies pending    Plan Will add Bentyl. Contiue Protonix and Carafate. Will follow.   Electronic Signatures: Jill Side (MD)  (Signed 13-Sep-13 14:45)  Authored: Chief Complaint, VITAL SIGNS/ANCILLARY NOTES, Brief Assessment, Assessment/Plan   Last Updated: 13-Sep-13 14:45 by Jill Side (MD)

## 2015-03-16 NOTE — Consult Note (Signed)
Brief Consult Note: Diagnosis: Nausea, vomiting and diarrhea.   Patient was seen by consultant.   Comments: Patient with one week h/o nausea, vomiting, diarrhea, abdominal pain and odynophagia. Consult was written on 09/08 but apparently on call GI was not called.  C. diff toxin was ordered on 09/07 but could not be done as she has not had any bowel movements since admission. No vomiting in last three days as well.  CT with ? enteritis.   Will proceed with EGD in am. Further recommendations to follow. Thanks.  Electronic Signatures: Jill Side (MD)  (Signed 10-Sep-13 18:51)  Authored: Brief Consult Note   Last Updated: 10-Sep-13 18:51 by Jill Side (MD)

## 2015-03-16 NOTE — H&P (Signed)
PATIENT NAME:  Lindsey [name], Lindsey [name] MR#:  [id number] DATE OF BIRTH:  1961-06-25  DATE OF ADMISSION:  08/03/2012  CHIEF COMPLAINT: Abdominal pain, nausea, vomiting, diarrhea for 6 days.   HISTORY OF PRESENT ILLNESS: A 54 year old African American female with a history hypertension, diabetes, cerebrovascular accident, CAD presented to the ED with nausea, vomiting, diarrhea, and abdominal pain for 6 days. The patient is alert, awake, oriented, in no acute distress. The patient said that she started to have abdominal pain about 6 days ago which is in the epigastric area, crampy, intermittent, 10 out of 10 maximum associated with nausea, vomiting, and diarrhea. She vomits whenever she eat. In addition, patient has multiple times diarrhea for the past 6 days. She also complains of fever, chills. She was sent home from the ED yesterday, but came back again today. CAT scan of the abdomen and pelvis show focal enteritis.   PAST MEDICAL HISTORY: Cerebrovascular accident, coronary artery disease, hypertension, diabetes, depression, chronic pain syndrome, morbid obesity, obstructive sleep apnea, on oxygen at bedtime, asthma, history of transient ischemic attack in the past.   SOCIAL HISTORY: Denies any smoking, drinking, or any illicit drugs   PAST SURGICAL HISTORY: Status post cholecystectomy, history of abdominal hernia repair, status post left total knee replacement, status post carpal tunnel release, gastric bypass, spinal stimulator placement, right wrist surgery, back surgery, bilateral rotator cuff surgery, distal clavicle resection.    ALLERGIES: Lodine, iodine, and contrast dye.   FAMILY HISTORY: Diabetes, heart disease.   REVIEW OF SYSTEMS. CONSTITUTIONAL: The patient has a fever, chills. No headache but has mild dizziness and weakness. EYES: No double vision, blurred vision. ENT: No postnasal drip, epistaxis, slurred speech, or dysphagia. RESPIRATORY: No cough, sputum, shortness of breath, or  hematemesis. CARDIOVASCULAR: No chest pain, palpitation, orthopnea, or nocturnal dyspnea. No leg edema. GASTROINTESTINAL: Positive for abdominal pain, nausea, vomiting, and diarrhea. No melena or bloody stool. GENITOURINARY: No dysuria, hematuria, or incontinence. ENDOCRINE: No polyuria, polydipsia, heat or cold intolerance. HEMATOLOGY: No easy bruising, bleeding. NEUROLOGY: No syncope, loss of consciousness, or seizure. SKIN: No rash or jaundice.   HOME MEDICATIONS: 1. Zolpidem 10 mg p.o. daily. 2. Zofran ODT 4 mg p.o. every 8 hours p.r.n. for nausea, vomiting.  3. Ventolin HFA 90 mcg inhalation 2 puffs every 6 hours.  4. Zocor 40 mg p.o. at bedtime.  5. Percocet 5/325 four times a day.  6. Oxycodone 10 mg p.o. once a day.  7. Oxybutynin 5 mg p.o. b.i.d.  8. Nexium 40 mg p.o. at bedtime.  9. Morphine 30 mg 1 tablet t.i.d.  10. Lopressor 100 mg 1/2 tablet p.o. daily.  11. Hydroxyzine 1 tablet t.i.d.   12. Folic acid 1 mg p.o. once a day.  13. Fluticasone 50 mcg inhalation 2 sprays nasal twice a day.  14. Cyclobenzaprine 10 mg p.o. t.i.d.  15. Calcium lactate 650 mg p.o. b.i.d.  16. Benicar/hydrochlorothiazide 40 mg/25 mg 1 tablet p.o. in the morning.  17. Alprazolam 0.5 mg p.o. 2 tablets once a day.  18. Advair Diskus 25 mcg 1 puff inhalation twice a day.   PHYSICAL EXAMINATION:  VITALS: Temperature 99.5, blood pressure 121/79, pulse 94, respirations 18, O2 saturation 100% in room air.   GENERAL: This patient is alert, awake, oriented, in no acute distress.   HEENT: Pupils round, equal, reactive to light, accommodation. Moist oral mucosa. Clear pharynx.   NECK: Supple. No JVD or carotid bruit. No lymphadenopathy. No thyromegaly.   CARDIOVASCULAR: S1, S2 regular rate and rhythm.  No murmurs or gallops.   PULMONARY: Bilateral air entry. No wheezing or rales. No use of accessory muscles to breathe.   ABDOMEN: Obese. Bowel sounds present. Soft. No distention. There is tenderness in  epigastric area. No rigidity, no rebound, no obvious organomegaly.   EXTREMITIES: No edema, clubbing, or cyanosis. No calf tenderness.   SKIN: No rash or jaundice.   NEUROLOGIC: Alert and oriented x3. No focal deficit. Power 5/5. Sensation intact.   LABORATORY DATA: Glucose 116, BUN 16, creatinine 1.06. Electrolytes normal. WBC 8, hemoglobin 12.3, platelets 413,000. Lipase 49.   CAT scan of the abdomen and pelvis showed suspicious for regional focal enteritis versus  distention of loop of small bowel, postsurgical changes consistent with patient's history of previous gastric bypass.   IMPRESSION:  1. Acute gastroenteritis.  2. Hypertension, controlled.  3. Diabetes.  4. History of coronary artery disease, cerebrovascular accident, asthma, obstructive sleep apnea, chronic pain syndrome, morbid obesity.   PLAN OF TREATMENT:  1. Patient will be placed for observation to medical floor. We will give clear liquid and give IV fluid support, Zofran, and pain control.  2. Continue hypertension medication except hydrochlorothiazide .  3. Continue aspirin, Zocor for coronary artery disease and history of cerebrovascular accident. 4. Continue nebulizer Advair, fluticasone.  5. Gastrointestinal and deep vein thrombosis prophylaxis.  6. I discussed the patient's situation and plan of treatment with the patient.   TIME SPENT: About 65 minutes.    ____________________________ Demetrios Loll, MD qc:vtd D: 08/03/2012 17:36:37 ET  T: 08/04/2012 07:01:02 ET  JOB#: [id number] cc: Demetrios Loll, MD, <Dictator> Demetrios Loll MD ELECTRONICALLY SIGNED 08/07/2012 17:10

## 2015-03-18 ENCOUNTER — Emergency Department
Admit: 2015-03-18 | Disposition: A | Discharge status: Home / Self Care | Payer: Self-pay | Admitting: Emergency Medicine

## 2015-03-20 NOTE — H&P (Signed)
PATIENT NAME:  Lindsey [name], Lindsey [name] MR#:  [id number] DATE OF BIRTH:  [birth year]  DATE OF ADMISSION:  06/29/2014  REFERRING PHYSICIAN: Dr. Dahlia Client.  PRIMARY CARE DOCTOR: Dr. Elijio Miles.  ADMITTING DIAGNOSES: Overdose, acute kidney injury, altered mental status.   HISTORY OF PRESENT ILLNESS: This is a 54 year old African American female who was brought to the Emergency Department for reported overdose. She told her caregiver and EMS that she had taken only 20 mg of her Percocet. It is unclear if she means that is the dose of her medicine or if that was the number of pills. Earlier in the day she complained of some nausea and took her fentanyl patch off due to feeling unwell. It is unknown exactly what else she may have taken to alleviate her symptoms. In the Emergency Department the patient was given some Narcan. She had some incontinence of bowel and some possible seizure-like behavior, which were considered for all symptoms. This prompted the initiation of naloxone drip.   REVIEW OF SYSTEMS: The patient cannot contribute to symptoms as she is in and out of responsiveness.   PAST MEDICAL HISTORY: Gleaned from previous chart significant for chronic pain syndrome. History of cerebrovascular accident, coronary artery disease, hypertension, diabetes type II, depression, jejunal ulcers, obstructive sleep apnea and asthma.   SURGICAL HISTORY: Cholecystectomy, abdominal hernia repair, spinal stimulator placement,  left total knee replacement,  carpal tunnel release, gastric bypass surgery, rotator cuff repair and some form of back surgery.   SOCIAL HISTORY: It is unclear with whom the patient actually lives  but her grandson is reportedly the caregiver, who had been around this afternoon. Her previous encounter, she does not smoke, drink or do any drugs other than those prescribed by her doctors.   PHYSICAL EXAMINATION:  VITAL SIGNS: Temperature 97.5, pulse 51, respirations 13, blood pressure 96/62, pulse  oximetry 100% on room air while sleeping.  GENERAL: The patient is somnolent. It is unclear if she is oriented at all. She is not currently in any distress.  HEENT: Normocephalic and atraumatic. PERRLA, EOMI, moist mucous membranes, total dental extraction.  NECK: Trachea is midline. No adenopathy.  CHEST: Symmetric and atraumatic.  CARDIOVASCULAR: Bradycardic, normal S1, S2 no rubs, clicks, or murmurs.  LUNGS: Clear to auscultation bilaterally normal effort and excursion.  ABDOMEN: Positive bowel sounds, soft, nontender, nondistended. No hepatosplenomegaly. GENITOURINARY: Normal external female genitalia. Foley in place.  MUSCULOSKELETAL: The patient moves all four extremities equally. She will not cooperate with strength  exam.  SKIN: No rashes or lesions I do not see any track marks aside from attempts at peripheral IV placement by ED nursing staff.  EXTREMITIES: No clubbing, cyanosis, or edema.  NEUROLOGIC: Cranial nerves II through XII grossly intact, although noted that patient does not fully cooperate with neurologic exam.  PSYCHIATRIC: Difficult to assess mood and affect due to patient somnolence.   PERTINENT LABORATORY AND RADIOLOGY RESULTS:  BUN 71, creatinine 5.05,  troponin negative. Urine drug screen positive for benzodiazepines. Hematocrit 28.1. A chest x-ray showed low lung volumes, but no acute cardiopulmonary process. Urinalysis is incomplete due to chromogen in the urine (orange in color). CT of head without contrast shows no acute intracranial abnormalities.   ASSESSMENT AND PLAN: This is a 54 year old female with likely a overdose.  1. Overdose. We presume she has a possible ingestion of unknown quantity and dose of Percocet. However, her urine toxicology is positive for benzos. With her degree of renal failure it is surprising that she cleared the quantity of Versed  given by the Emergency Department for seizure like activity. At this time we will continue a naloxone drip until  it is clear that the patient is no longer in acute withdrawal from the Narcan push and is more alert. We will admit the patient to the Critical Care Unit to monitor her heart rate and blood pressure. She is protecting her airway at this time and is hemodynamically stable.  2. Acute kidney injury.  Estimated GFR is 10. We will start the patient on IV fluid and avoid nephrotoxic agents. I have ordered urine sodium and creatinine to obtain fractional excretion of sodium. We will also consult nephrology. She does not have any other significant electrolyte imbalances at this time.  3. Altered mental status. We will  continue to monitor. The patient will occasionally awaken to speak briefly or grunt affirmatively or negatively to answer questions. She is difficult to arouse.  4. Coronary artery disease. First set of troponins are negative. There are no acute EKG abnormalities. Continue to trend cardiac enzymes.  5. Hypertension. Currently controlled, in fact, her blood pressure is slightly on the low side as to be expected following Narcan. Continue to monitor. We will restart her home medications once the medication reconciliation is complete if they are appropriate.  6. Diabetes type II. Sliding scale insulin while in the hospital.  7. Obstructive sleep apnea. Currently, the patient does not show symptoms of airway obstruction. She does use nocturnal oxygen at home. If she desaturates through the night we will obviously supplement oxygen as needed and assess for oxygen as needed. She is not a candidate for CPAP at this time due to her altered mental status.  8. Obesity. BMI is 48.3.  9. Deep vein thrombosis prophylaxis SCDs.  10. Gastrointestinal prophylaxis. Start an IV proton pump inhibitor.   The patient is a full code.   TIME SPENT ON ADMISSION ORDERS AND PATIENT CARE: 35 minutes.   ____________________________ Norva Riffle. Marcille Blanco, MD msd:sg D: 06/29/2014 04:52:34 ET T: 06/29/2014 06:14:13  ET JOB#: [id number]  cc: Norva Riffle. Marcille Blanco, MD, <Dictator> Norva Riffle Timtohy Broski MD ELECTRONICALLY SIGNED 06/29/2014 23:45

## 2015-03-20 NOTE — Discharge Summary (Signed)
PATIENT NAME:  Lindsey [name], Lindsey [name] MR#:  [id number] DATE OF BIRTH:  1961-08-29  DATE OF ADMISSION:  06/29/2014 DATE OF DISCHARGE:  07/01/2014  ADMITTING PHYSICIAN: Rosilyn Mings, MD  DISCHARGE PHYSICIAN: Venetia Maxon. Tejan-Sie, MD  ADMITTING DIAGNOSIS: Opiate overdose with kidney injury, altered mental status.  DISCHARGE DIAGNOSES: Opiate overdose unintentional, acute renal failure, hypertension.   CONSULTATIONS: Nephrology, Tama High, MD.  IMAGING: Ultrasound of the kidneys 06/29/2014 normal. Head CT without contrast 06/29/2014 no acute intracranial abnormalities.   PROCEDURES: None.   HOSPITAL COURSE: This lady presented to the Emergency Room with altered mental status with a question of possible overdose of Percocet. Please refer to history and physical for full details. The patient was admitted to the medical floor. She was administered intravenous fluids to address her renal failure, which improved in about 24 to 48 hours. The patient became fully awake and alert and stated that she has become more drowsy since the dose of her fentanyl patch had been recently increased. The patient'Boy Delamater hospital stay here was otherwise uncomplicated. She was discharged on a low dose fentanyl patch and told to inform me if she had any complications from that dose reduction.   DIET: Low fat, low calorie diet.   ACTIVITY: As tolerated.   FOLLOWUP: Dr. Elijio Miles in 1 to 2 weeks.   DISCHARGE MEDICATIONS: Please refer to medical reconciliation reviewed by me.   DISCHARGE TIME SPENT: Thirty-five minutes.    ____________________________ Venetia Maxon Elijio Miles, MD sat:TT D: 07/21/2014 14:01:23 ET T: 07/21/2014 21:05:23 ET JOB#: [id number]  cc: Sheikh A. Elijio Miles, MD, <Dictator> Veverly Fells MD ELECTRONICALLY SIGNED 07/27/2014 14:10

## 2015-03-21 NOTE — Discharge Summary (Signed)
PATIENT NAME:  Lindsey [name], Lindsey [name] MR#:  [id number] DATE OF BIRTH:  [birth year]  DATE OF ADMISSION:  12/02/2011 DATE OF DISCHARGE:  12/06/2011  ADMITTING PHYSICIAN: Rochel Brome, MD   PRIMARY CARE PHYSICIAN AND CONSULTING PHYSICIAN: Dr. Elijio Miles   ADMISSION DIAGNOSIS: Abdominal pain and odorous drainage from wound.  SECONDARY DIAGNOSES:  1. Dysphagia. 2. Chronic pain. 3. Diabetes mellitus, type II. 4. Recent cerebrovascular accident.  5. Hyperlipidemia.  6. Asthma. 7. History of coronary artery disease.   DISCHARGE DIAGNOSES: 1. Dysphagia. 2. Chronic pain. 3. Diabetes mellitus, type II. 4. Recent cerebrovascular accident.  5. Hyperlipidemia.  6. Asthma. 7. History of coronary artery disease.  HOSPITAL COURSE: This is a 54 year old female who had umbilical hernia repair over a month ago. She presented to the Emergency Room with abdominal pain and concerns over odorous drainage from the umbilicus. She was started on ceftriaxone and metronidazole which she continued through the hospital stay. Medical service was contacted for management of her other medical issues. The patient was afebrile and white blood cell count was normal. There was no further drainage or appearance of cellulitis after a couple of days. She does have a very deep navel and had some difficulty cleaning it. Wound care instructions were given. Additional antibiotics were not felt to be necessary. On day of discharge, the wound was clean. She was having difficulty swallowing but able to take liquids and some soft food. She had required esophageal dilation by Dr. Dionne Milo in the past, and follow up was arranged with him.  Discharge plan was discussed with the patient.   MEDICATIONS:  1. Advair Diskus 250/50 mcg 1 puff 2 times a day. 2. Cyclobenzaprine 10 mg t.i.d.  3. Lorazepam 1 mg t.i.d.  4. Calcium lactate 650 mg tablet b.i.d.  5. Zolpidem 10 mg at bedtime.  6. Oxybutynin 5 mg b.i.d.  7. Nexium 40 mg at bedtime.   8. Folic acid 1 mg daily.  9. Hydroxyzine 25 mg t.i.d.  10. Flonase 0.05 mg two sprays b.i.d.  11. Aspirin 325 mg daily.  12. Simvastatin 40 mg at bedtime.  13. Metoprolol 100 mg ER tablet one-half tab daily. 14. Ventolin HFA 4 times a day as needed. 15. Percocet 1 tab every six hours as needed. 16. Estrace vaginal cream 3 times a week. 17. Morphine ER 30 mg b.i.d.   DISCHARGE INSTRUCTIONS:  1. Use cotton swab daily to clean navel. 2. Mechanical soft diet.  3. No driving while taking morphine, Percocet, or lorazepam. 4. Follow-up with Dr. Tamala Julian in two weeks.  5. Follow-up with Dr. Dionne Milo in two weeks regarding dysphagia.  6. Seek medical treatment if you develop any increasing abdominal pain, fever, or other concerns.  ____________________________ Celene Squibb. Theda Sers, [place] amc:drc D: 12/25/2011 13:11:54 ET T: 12/25/2011 13:50:38 ET JOB#: [id number]  cc: Celene Squibb. Theda Sers, [place], <Dictator> Jamesetta Greenhalgh M Tajae Maiolo PA ELECTRONICALLY SIGNED 12/25/2011 14:21

## 2015-03-21 NOTE — Consult Note (Signed)
PATIENT NAME:  Lindsey [name], Lindsey [name] MR#:  [id number] DATE OF BIRTH:  1961/11/20  DATE OF CONSULTATION:  12/02/2011  REFERRING PHYSICIAN:  Dr. Vella Kohler   CONSULTING PHYSICIAN:  Addaleigh Nicholls H. Posey Pronto, MD  REASON FOR CONSULTATION: Medical opinion regarding patient's hypertension, diabetes, sleep apnea, asthma, recent cerebrovascular accident, history of coronary artery disease.   HISTORY OF PRESENT ILLNESS: Patient is a 54 year old African American female who was hospitalized with a recent cerebrovascular accident from 11/26 to 10/27/2011 which affected her right side. Patient reports that she still has some weakness on the right upper extremity, right lower extremity, when she walks she drags her feet but has done well. Prior to the stroke patient was hospitalized here on 11/12, had a ventral hernia that was repaired. Patient was doing okay until last Wednesday when she noticed that there was some erythema around the incision site and then subsequently started having drainage and foul smell therefore came to the ED. Now she is being admitted for infection of the mesh of the ventral hernia that was repaired. Patient otherwise states that she has been having headaches for the last few days. Has not had any fevers or chills. Denied any chest pain or shortness of breath. She states that she stays cold. She has had diarrhea which she reports has been going on for a few months now. Also, has been nauseated but has not thrown up. She otherwise complains of abdominal pain. No difficulty with urination, burning, hesitancy, or urgency.   PAST MEDICAL HISTORY:  1. Recent cerebrovascular accident.  2. Coronary artery disease, process NSTEMI in the past, according to her no intervention was done.  3. Diabetes, which she reports she was diabetic multiple years ago prior to losing 340 pounds after gastric bypass surgery, currently it is diet controlled.  4. Hypertension.  5. Depression.  6. Chronic pain syndrome.   7. Morbid obesity.  8. Obstructive sleep apnea, uses oxygen 2 liters at bedtime.  9. Asthma.  10. History of transient ischemic attack in the past.    PAST SURGICAL HISTORY:  1. Status post cholecystectomy.  2. History of abdominal hernia repair.  3. Status post left total knee replacement. 4. Status post carpal tunnel release.  5. Gastric bypass.  6. Spinal stimulator placement.  7. Right wrist surgery.  8. Status post back surgery.  9. Status post bilateral rotator cuff surgery.  10. Distal clavicle resection in the past.   ALLERGIES: Lodine, iodine and contrast dye.   CURRENT MEDICATIONS:  1. Advair 250/50, 1 puff b.i.d.  2. Cyclobenzaprine 10 mg 3 times per day.  3. Lorazepam 1 mg t.i.d.  4. Calcium lactate 650, 1 tab p.o. b.i.d.  5. Ambien 10, 1 tab p.o. at bedtime.  6. Oxybutynin 5 mg 1 tab p.o. b.i.d.  7. Nexium 40, 1 tab p.o. daily.  8. Folic acid 1 mg daily.  9. Hydroxyzine 25, 1 tab p.o. t.i.d.  10. OxyContin 20 mg 1 tab p.o. b.i.d.  11. Flonase two sprays to each nostril b.i.d.  12. Aspirin 325 mg daily.  13. Simvastatin 40 daily.  14. Metoprolol succinate 50 daily.  15. Ventolin 4 times per day. 16. Acetaminophen oxycodone 300/10, 1 tab p.o. q.6 p.r.n. pain. 17. Estrace vaginal 0.1 mg vaginal cream 3 times a week.   SOCIAL HISTORY: Patient lives with her 56 year old grandson. Her son lives close by. No history of smoking, alcohol or drug use.   FAMILY HISTORY: Mother had diabetes. Dad had heart disease.   REVIEW OF  SYSTEMS: CONSTITUTIONAL: Denies any fevers. Complains of fatigue, weakness. Has abdominal pain. No weight loss. No weight gain. EYES: No blurred or double vision. No pain. No redness. No inflammation. No glaucoma. No cataracts. ENT: No tinnitus. No ear pain. No hearing loss. Does have seasonal allergies. No epistaxis. No nasal discharge. Complains of difficulty swallowing. RESPIRATORY: No cough. No wheezing. No hemoptysis. No dyspnea. No painful  respirations. Does have asthma. No tuberculosis. No pneumonia. CARDIOVASCULAR: No chest pain. No orthopnea. No edema. No arrhythmia. No palpitations. No syncope. GASTROINTESTINAL: Has nausea. Complains of chronic diarrhea. Also complains of abdominal pain. No hematemesis. No melena. No rectal bleeding. GENITOURINARY: Denies any dysuria, hematuria, renal calculus or frequency. ENDO: Denies any polydipsia, nocturia, or thyroid problems. Denies any increased sweating, heat or cold intolerance. HEME/LYMPH: Denies any anemia, easy bruisability, or bleeding. SKIN: Denies any acne, rash, changes in mole or hair. MUSCULOSKELETAL: Has chronic pain in multiple joints of her body. NEUROLOGIC: No numbness. Has right-sided weakness from her recent cerebrovascular accident. Has a history of transient ischemic attack. No seizures. No memory loss. PSYCHIATRIC: No anxiety. No insomnia. No ADD.   PHYSICAL EXAMINATION:  VITAL SIGNS: Temperature 96.7, pulse 90, respiratory rate 18, blood pressure 108/62.   GENERAL: Patient is an obese 54 year old African American female currently not in any acute distress.   HEENT: Head atraumatic, normocephalic. Pupils equally round, reactive to light and accommodation. Extraocular movements intact. Oropharynx is very dry. She does not have any teeth currently. Nasal exam shows no drainage. No ulceration. Mouth is dry. Ear exam shows no drainage, ulceration.   NECK: No thyromegaly. No carotid bruits.   CARDIOVASCULAR: Regular rate and rhythm. No murmurs, rubs, clicks, or gallops. PMI is not displaced.   LUNGS: Clear to auscultation bilaterally without any rales, rhonchi, wheezing.   ABDOMEN: Soft. She has a dressing in place from Dr. Sherilyn Banker recent evaluation. There are positive bowel sounds. There is no guarding. No rebound.   EXTREMITIES: No clubbing, cyanosis, edema.   NEUROLOGICAL: Awake, alert, oriented x3. Right-sided weakness, 4/5. Reflexes 2+. Otherwise cranial nerves II  through XII grossly intact.   VASCULAR: Good DP, PT pulses.   LYMPHATICS: No lymph nodes palpable.   MUSCULOSKELETAL: There is no swelling or erythema.   PSYCHIATRIC: Not anxious or depressed.   LABORATORY, DIAGNOSTIC, AND RADIOLOGICAL DATA: Glucose 87, BUN 15, creatinine 1.10, sodium 144, potassium 3.9, chloride 109, CO2 27, calcium 9.0, total protein 7.0, albumin 3.3, bilirubin total 0.4, alkaline phosphatase 96, AST 18, ALT 20, WBC 9.2, hemoglobin 11.3, platelet count 336.   ASSESSMENT AND PLAN: Patient is a 54 year old African American female status post abdominal ventral hernia surgery about a month ago, presents with drainage, has abdominal wound infection related to the mesh. Being admitted by surgery for antibiotics, possible removal of the mesh.  1. Diabetes type 2 which is diet controlled. Will place her on sliding scale. Follow her blood sugars.  2. Recent cerebrovascular accident. Continue aspirin if okay with surgery. In light of possible surgery I will hold that for time being.  3. Hyperlipidemia. Continue simvastatin.  4. Asthma. Continue MDI and inhalers.  5. Coronary artery disease. Aspirin if okay with surgery. Will continue metoprolol as taking at home.  6. MISCELLANEOUS: Recommend Lovenox for deep vein thrombosis prophylaxis if okay with surgery. Will place her on IV Protonix for GI prophylaxis.    TIME SPENT: 40 minutes.  ____________________________ Lafonda Mosses Posey Pronto, MD shp:cms D: 12/02/2011 16:55:23 ET T: 12/03/2011 13:08:29 ET  JOB#: [id number] cc:  Clee Pandit H. Posey Pronto, MD, <Dictator> Alric Seton MD ELECTRONICALLY SIGNED 12/12/2011 14:24

## 2015-03-21 NOTE — Consult Note (Signed)
Chief Complaint:   Subjective/Chief Complaint pain is getting better   VITAL SIGNS/ANCILLARY NOTES: **Vital Signs.:   06-Jan-13 01:22   Vital Signs Type Q 4hr   Temperature Temperature (F) 98   Celsius 36.6   Temperature Source oral   Pulse Pulse 94   Pulse source per Dinamap   Respirations Respirations 18   Systolic BP Systolic BP 88   Diastolic BP (mmHg) Diastolic BP (mmHg) 56   Mean BP 66   BP Source Dinamap   Pulse Ox % Pulse Ox % 95   Pulse Ox Activity Level  At rest   Oxygen Delivery Room Air/ 21 %    02:54   Systolic BP Systolic BP 94   Diastolic BP (mmHg) Diastolic BP (mmHg) 58   Mean BP 70   BP Source manual    05:36   Vital Signs Type Q 4hr   Temperature Temperature (F) 98.2   Celsius 36.7   Temperature Source oral   Pulse Pulse 109   Pulse source per Dinamap   Respirations Respirations 18   Systolic BP Systolic BP 270   Diastolic BP (mmHg) Diastolic BP (mmHg) 75   Mean BP 86   BP Source Dinamap   Pulse Ox % Pulse Ox % 97   Pulse Ox Activity Level  At rest   Oxygen Delivery Room Air/ 21 %    07:04   Vital Signs Type POCT   Nurse Fingerstick (mg/dL) FSBS (fasting range 65-99 mg/dL) 143    12:03   Vital Signs Type POCT   Nurse Fingerstick (mg/dL) FSBS (fasting range 65-99 mg/dL) 131   Comments/Interventions  Nurse Notified    14:15   Vital Signs Type Routine   Temperature Temperature (F) 98.4   Celsius 36.8   Temperature Source oral   Pulse Pulse 101   Pulse source per Dinamap   Respirations Respirations 19   Systolic BP Systolic BP 623   Diastolic BP (mmHg) Diastolic BP (mmHg) 78   Mean BP 88   BP Source Dinamap   Pulse Ox % Pulse Ox % 97   Pulse Ox Activity Level  At rest   Oxygen Delivery Room Air/ 21 %  *Intake and Output.:   06-Jan-13 02:15   Grand Totals Intake:   Output:  300    Net:  -300 24 Hr.:  -300   Urine ml     Out:  300   Urinary Method  Up to BR    05:38   Grand Totals Intake:   Output:  300    Net:  -300 24 Hr.:   -600   Urine ml     Out:  300   Urinary Method  Up to BR    05:39   Grand Totals Intake:   Output:  200    Net:  -200 24 Hr.:  -800   Urine ml     Out:  200   Urinary Method  Up to BR    Shift 07:00   Grand Totals Intake:   Output:  800    Net:  -800 24 Hr.:  -800   Urine ml     Out:  800   Length of Stay Totals Intake:   Output:  800    Net:  -800    Daily 07:00   Grand Totals Intake:   Output:  800    Net:  -800 24 Hr.:  -800   Urine ml     Out:  800  Length of Stay Totals Intake:   Output:  800    Net:  -800    08:25   Grand Totals Intake:   Output:      Net:   78 Hr.:     Unmeasured Intake  Sips   Percentage of Meal Eaten  100    12:20   Grand Totals Intake:  240 Output:      Net:  240 24 Hr.:  240   Oral Intake      In:  240   Percentage of Meal Eaten  100    Shift 15:00   Grand Totals Intake:  240 Output:      Net:  240 24 Hr.:  240   Oral Intake      In:  240   Length of Stay Totals Intake:  240 Output:  800    Net:  -560   Brief Assessment:   Respiratory normal resp effort    Gastrointestinal Normal    Gastrointestinal details normal Soft  No masses palpable  Bowel sounds normal  No rebound tenderness  No gaurding    Additional Physical Exam the celulitis is much better than before and smell is getting better also   Routine Chem:  05-Jan-13 11:46    Glucose, Serum 87   BUN 15   Creatinine (comp) 1.10   Sodium, Serum 144   Potassium, Serum 3.9   Chloride, Serum 109   CO2, Serum 27   Calcium (Total), Serum 9.0  Hepatic:  05-Jan-13 11:46    Bilirubin, Total 0.4   Alkaline Phosphatase 96   SGPT (ALT) 20   SGOT (AST) 18   Total Protein, Serum 7.0   Albumin, Serum 3.3  Routine Chem:  05-Jan-13 11:46    Osmolality (calc) 287   eGFR (African American) >60   eGFR (Non-African American) 55   Anion Gap 8  Routine Hem:  05-Jan-13 11:46    WBC (CBC) 9.2   RBC (CBC) 3.94   Hemoglobin (CBC) 11.3   Hematocrit (CBC) 34.6    Platelet Count (CBC) 336   MCV 88   MCH 28.5   MCHC 32.5   RDW 15.8  Routine UA:  05-Jan-13 14:53    Color (UA) Straw   Clarity (UA) Clear   Glucose (UA) Negative   Bilirubin (UA) Negative   Ketones (UA) Negative   Specific Gravity (UA) 1.009   Blood (UA) Negative   pH (UA) 7.0   Protein (UA) Negative   Nitrite (UA) Negative   Leukocyte Esterase (UA) Negative   RBC (UA) <1 /HPF   WBC (UA) <1 /HPF   Mucous (UA) PRESENT  Routine Micro:  05-Jan-13 14:53    Specimen Source CC   Radiology Results: CT:    05-Jan-13 16:45, CT Abdomen and Pelvis Without Contrast   CT Abdomen and Pelvis Without Contrast    REASON FOR EXAM:    (1) infected abdominal wound .history of ventral   hernia with mesh; (2) ventral h  COMMENTS:       PROCEDURE: CT  - CT ABDOMEN AND PELVIS W0  - Dec 02 2011  4:45PM     RESULT: Noncontrast CT of the abdomen and pelvis is reconstructed at 3.0   mm slice thickness in the axial plane and compared to previous exam dated   27 June 2011 and images of 16 June 2011.    Between images 9 he and 106 there is evidence of previous ventral hernia   repair.  There is increased density including a small amount of air in the   subcutaneous fatty tissues. It is difficult to tell if there is any bowel   herniated into this area or this is all postoperative change. There is a   tubular appearance which could represent fistulous communication or loop     of bowel containing air. This extends toward the umbilicus. Followup   study with oral and IV contrast may be beneficial to better evaluate   these regions. No drainable abscess is evident. Slightly distended loops   of small bowel are seenin the left mid abdomen. The kidneys show no   obstruction. Noncontrast images of the liver, spleen, pancreas and   abdominal aorta are unremarkable. The urinary bladder appears within   normal limits. No intraperitoneal abnormal free fluid or fluid collection   is seen. Postoperative  changes from previous lumbar surgery are noted.   The lung bases appear to be within normal limits. The pancreas is   unremarkable. Cholecystectomy clips are present.    IMPRESSION:  Possible postoperative inflammatory change without a defined   abscess. Small loop of bowel herniated through the area of surgery is not   completely excluded. A fistulous communication with the skin could give a   similar appearance. Repeat scan with oral and IV contrast may be     beneficial to better define the process in the subcutaneous tissues.          Verified By: Sundra Aland, M.D., MD   Assessment/Plan:  Assessment/Plan:   Assessment pt has celulitis with infection althought improving .    Plan will notify Dr Tamala Julian tomorrow to take over the case   Electronic Signatures: Vella Kohler (MD)  (Signed 06-Jan-13 16:15)  Authored: Chief Complaint, VITAL SIGNS/ANCILLARY NOTES, Brief Assessment, Lab Results, Radiology Results, Assessment/Plan   Last Updated: 06-Jan-13 16:15 by Vella Kohler (MD)

## 2015-03-21 NOTE — H&P (Signed)
Subjective/Chief Complaint smell from the abdominal wall after surgery one month    History of Present Illness this patient who had surgery done by dr Katrinka Blazing for ventral hernia now coes to emergency room with abdominal pain and foul smelling discharge from the abdoinal wall.pt says she had repair of ventral hernia with mesh and for the last four days the wound is draining puss and fowl smelling discharge.pt is nauseated and have abdominal pain mostly in the incision area    Past History cardiac disease,sleep aponeahiatal hernia,    Past Medical Health Coronary Artery Disease, Diabetes Mellitus, Stroke   Past Med/Surgical Hx:  Diabetes (Diet Controlled):   Sleep Apnea/ O2 @ 2L HS:   Gastric Reflux:   Back Pain, Chronic:   Hiatal Hernia:   Gastroenteritis:   Anemia:   Stroke:   Depression:   MI:   hypertension:   Left total knee replacement:   Spinal Cord Stimulator:   hysterectomy:   Cholecystectomy:   Hernia Repair:   Gastric Bypass:   TLIF L5-S1 360 degrees:   carpal tunnel both wrists:   ALLERGIES:  Lodine: Itching  Contrast - Iodinated Radiocontrast Dye: Rash  Anti-inflammatories: Unknown  HOME MEDICATIONS:  Advair Diskus 250 mcg-50 mcg inhalation powder: 1 puff(s) inhaled 2 times a day, Active  cyclobenzaprine 10 mg oral tablet: 1 tab(s) orally 3 times a day, Active  lorazepam 1 mg oral tablet: 1 tab(s) orally 3 times a day, Active  calcium lactate 650 mg oral tablet: 1 tab(s) orally 2 times a day, Active  zolpidem 10 mg oral tablet: 1 tab(s) orally once a day (at bedtime), Active  oxybutynin 5 mg oral tablet: 1 tab(s) orally 2 times a day, Active  Nexium 40 mg oral delayed release capsule: 1 cap(s) orally once a day (at bedtime), Active  folic acid 1 mg oral tablet: 1 tab(s) orally once a day, Active  hydrOXYzine hydrochloride 25 mg oral tablet: 1 tab(s) orally 3 times a day, Active  OxyContin 20 mg oral tablet, extended release: 1 tab(s) orally 2 times a day,  Active  Flonase 0.05 mg/inh spray: 2 spray(s) in each nostril 2 times a day, Active  one touch test strips:  Check blood sugar 3 times a day, Active  one touch lancets: use as directed., Active  aspirin 325 mg oral tablet: 1 tab(s) orally once a day, Active  simvastatin 40 mg oral tablet: 1 tab(s) orally once a day (at bedtime), Active  metoprolol succinate 100 mg oral tablet, extended release: 1/2 tab(s) orally , Active  Ventolin HFA:  orally 4 times a day, Active  acetaminophen-oxycodone 300 mg-10 mg oral tablet: 1 tab(s) orally every 6 hours, As Needed, Active  Estrace Vaginal 0.1 mg/g vaginal cream:  vaginal 3 times a week, Active  Family and Social History:   Family History Non-Contributory    Social History positive  tobacco    + Tobacco Current (within 1 year)    Place of Living Home   Review of Systems:   Fever/Chills Yes    Sputum No    Abdominal Pain Yes    Diarrhea No    Constipation No    Nausea/Vomiting Yes    SOB/DOE Yes    Chest Pain No    Dysuria No    Medications/Allergies Reviewed Medications/Allergies reviewed   Physical Exam:   GEN WN    HEENT normal    NECK supple  No masses    RESP normal resp effort  no  use of accessory muscles    ABD positive tenderness  denies Flank Tenderness  no liver/spleen enlargement  no hernia  soft  normal BS  pt has fowl smelling drainage from the wound    SKIN cellulitis abdominal wall    NEURO cranial nerves intact    Additional Comments pt seems to have cellulitis of the skin around the umbilical area   Routine Chem:  05-Jan-13 11:46    Glucose, Serum 87   BUN 15   Creatinine (comp) 1.10   Sodium, Serum 144   Potassium, Serum 3.9   Chloride, Serum 109   CO2, Serum 27   Calcium (Total), Serum 9.0  Hepatic:  05-Jan-13 11:46    Bilirubin, Total 0.4   Alkaline Phosphatase 96   SGPT (ALT) 20   SGOT (AST) 18   Total Protein, Serum 7.0   Albumin, Serum 3.3  Routine Chem:  05-Jan-13 11:46     Osmolality (calc) 287   eGFR (African American) >60   eGFR (Non-African American) 55   Anion Gap 8  Routine Hem:  05-Jan-13 11:46    WBC (CBC) 9.2   RBC (CBC) 3.94   Hemoglobin (CBC) 11.3   Hematocrit (CBC) 34.6   Platelet Count (CBC) 336   MCV 88   MCH 28.5   MCHC 32.5   RDW 15.8  Routine UA:  05-Jan-13 14:53    Color (UA) Straw   Clarity (UA) Clear   Glucose (UA) Negative   Bilirubin (UA) Negative   Ketones (UA) Negative   Specific Gravity (UA) 1.009   Blood (UA) Negative   pH (UA) 7.0   Protein (UA) Negative   Nitrite (UA) Negative   Leukocyte Esterase (UA) Negative   RBC (UA) <1 /HPF   WBC (UA) <1 /HPF   Mucous (UA) PRESENT     Assessment/Admission Diagnosis pt had mesh put in so we will treat her with antibiotics and saline soaks to wound    Plan hospital admission ,iv antibiotics   Electronic Signatures: Vella Kohler (MD)  (Signed 05-Jan-13 16:06)  Authored: CHIEF COMPLAINT and HISTORY, PAST MEDICAL/SURGIAL HISTORY, ALLERGIES, HOME MEDICATIONS, FAMILY AND SOCIAL HISTORY, REVIEW OF SYSTEMS, PHYSICAL EXAM, LABS, ASSESSMENT AND PLAN   Last Updated: 05-Jan-13 16:06 by Vella Kohler (MD)

## 2015-04-07 ENCOUNTER — Ambulatory Visit: Payer: Medicare Other | Admitting: Podiatry

## 2015-04-12 ENCOUNTER — Encounter: Payer: Self-pay | Admitting: Podiatry

## 2015-04-12 ENCOUNTER — Encounter: Payer: Self-pay | Admitting: *Deleted

## 2015-04-12 ENCOUNTER — Ambulatory Visit (INDEPENDENT_AMBULATORY_CARE_PROVIDER_SITE_OTHER): Payer: Medicare Other | Admitting: Podiatry

## 2015-04-12 ENCOUNTER — Ambulatory Visit (INDEPENDENT_AMBULATORY_CARE_PROVIDER_SITE_OTHER): Payer: Medicare Other

## 2015-04-12 ENCOUNTER — Other Ambulatory Visit: Payer: Self-pay | Admitting: *Deleted

## 2015-04-12 VITALS — BP 107/64 | HR 60 | Resp 16 | Ht 67.0 in | Wt 274.0 lb

## 2015-04-12 DIAGNOSIS — M7750 Other enthesopathy of unspecified foot: Secondary | ICD-10-CM

## 2015-04-12 DIAGNOSIS — M779 Enthesopathy, unspecified: Secondary | ICD-10-CM

## 2015-04-12 DIAGNOSIS — M778 Other enthesopathies, not elsewhere classified: Secondary | ICD-10-CM

## 2015-04-12 DIAGNOSIS — E0842 Diabetes mellitus due to underlying condition with diabetic polyneuropathy: Secondary | ICD-10-CM | POA: Diagnosis not present

## 2015-04-12 DIAGNOSIS — E119 Type 2 diabetes mellitus without complications: Secondary | ICD-10-CM

## 2015-04-12 NOTE — Progress Notes (Signed)
   Subjective:    Patient ID: Lindsey [name], female    DOB: July 20, 1961, 54 y.o.   MRN: [id number]  HPI  About a month ago had my left great toenail ran into by my grandaughter and the nail lifted and i had it removed it is healing but it is still very tender.  My toes on the left foot is numb and hurts on the bottom. Left #2,3,4 toes. Numb. My blood sugar runs good. Had gastric by pass surgery and lost 420 pounds   Review of Systems     Objective:   Physical Exam: I have reviewed her past medical history medications allergies surgery social history and review of systems. Pulses are strongly palpable. However she does have decreased sensorium to toes #23 and 4 of the left foot. The remainder of each foot demonstrates relatively normal sensation. Deep tendon reflexes are intact bilateral and muscle strength equal bilateral. Orthopedic evaluation does do some straight mild hammertoe deformities bilateral mild hallux valgus deformity is bilateral. She has tenderness on range of motion of the second third and fourth metatarsophalangeal joints bilateral. Radiographs confirmed no major osseous abnormalities. Cutaneous evaluation demonstrates supple well-hydrated cutis no erythema edema saline as drainage or odor.      Assessment & Plan:  Assessment: Past history of diabetes mellitus with diabetic peripheral neuropathy left foot. Capsulitis toes #2 3 #4 of the left foot.  Plan: Injected the metatarsophalangeal joints #2 #3 and #4 of the left foot today. Call interferes regarding the hallux nail plate left. I will follow up with her in approximately 1 month. We discussed appropriate shoe gear stretching exercises ice therapy issue modifications.

## 2015-05-10 ENCOUNTER — Ambulatory Visit: Payer: Medicare Other | Admitting: Podiatry

## 2015-05-17 ENCOUNTER — Ambulatory Visit: Payer: Medicare Other | Admitting: Podiatry

## 2015-08-27 ENCOUNTER — Other Ambulatory Visit: Payer: Self-pay | Admitting: Internal Medicine

## 2015-08-27 DIAGNOSIS — Z1231 Encounter for screening mammogram for malignant neoplasm of breast: Secondary | ICD-10-CM

## 2015-08-31 ENCOUNTER — Observation Stay: Payer: Medicare Other

## 2015-08-31 ENCOUNTER — Observation Stay
Admission: EM | Admit: 2015-08-31 | Discharge: 2015-09-02 | Disposition: A | Discharge status: Home / Self Care | Payer: Medicare Other | Attending: Internal Medicine | Admitting: Internal Medicine

## 2015-08-31 ENCOUNTER — Observation Stay
Admit: 2015-08-31 | Discharge: 2015-08-31 | Disposition: A | Discharge status: Home / Self Care | Payer: Medicare Other | Attending: Internal Medicine | Admitting: Internal Medicine

## 2015-08-31 ENCOUNTER — Emergency Department: Payer: Medicare Other

## 2015-08-31 DIAGNOSIS — I6523 Occlusion and stenosis of bilateral carotid arteries: Secondary | ICD-10-CM | POA: Insufficient documentation

## 2015-08-31 DIAGNOSIS — I2510 Atherosclerotic heart disease of native coronary artery without angina pectoris: Secondary | ICD-10-CM | POA: Diagnosis not present

## 2015-08-31 DIAGNOSIS — R200 Anesthesia of skin: Secondary | ICD-10-CM | POA: Insufficient documentation

## 2015-08-31 DIAGNOSIS — Z886 Allergy status to analgesic agent status: Secondary | ICD-10-CM | POA: Insufficient documentation

## 2015-08-31 DIAGNOSIS — J018 Other acute sinusitis: Secondary | ICD-10-CM | POA: Diagnosis present

## 2015-08-31 DIAGNOSIS — Z885 Allergy status to narcotic agent status: Secondary | ICD-10-CM | POA: Insufficient documentation

## 2015-08-31 DIAGNOSIS — I1310 Hypertensive heart and chronic kidney disease without heart failure, with stage 1 through stage 4 chronic kidney disease, or unspecified chronic kidney disease: Secondary | ICD-10-CM | POA: Insufficient documentation

## 2015-08-31 DIAGNOSIS — N183 Chronic kidney disease, stage 3 (moderate): Secondary | ICD-10-CM | POA: Insufficient documentation

## 2015-08-31 DIAGNOSIS — I639 Cerebral infarction, unspecified: Secondary | ICD-10-CM

## 2015-08-31 DIAGNOSIS — J329 Chronic sinusitis, unspecified: Secondary | ICD-10-CM | POA: Diagnosis not present

## 2015-08-31 DIAGNOSIS — G4730 Sleep apnea, unspecified: Secondary | ICD-10-CM | POA: Insufficient documentation

## 2015-08-31 DIAGNOSIS — K219 Gastro-esophageal reflux disease without esophagitis: Secondary | ICD-10-CM | POA: Insufficient documentation

## 2015-08-31 DIAGNOSIS — R569 Unspecified convulsions: Principal | ICD-10-CM

## 2015-08-31 DIAGNOSIS — R4781 Slurred speech: Secondary | ICD-10-CM | POA: Insufficient documentation

## 2015-08-31 DIAGNOSIS — Z91013 Allergy to seafood: Secondary | ICD-10-CM | POA: Diagnosis not present

## 2015-08-31 DIAGNOSIS — E785 Hyperlipidemia, unspecified: Secondary | ICD-10-CM | POA: Diagnosis not present

## 2015-08-31 DIAGNOSIS — Z9884 Bariatric surgery status: Secondary | ICD-10-CM | POA: Insufficient documentation

## 2015-08-31 DIAGNOSIS — I252 Old myocardial infarction: Secondary | ICD-10-CM | POA: Insufficient documentation

## 2015-08-31 DIAGNOSIS — M542 Cervicalgia: Secondary | ICD-10-CM | POA: Diagnosis not present

## 2015-08-31 DIAGNOSIS — J019 Acute sinusitis, unspecified: Secondary | ICD-10-CM | POA: Diagnosis not present

## 2015-08-31 DIAGNOSIS — Z91041 Radiographic dye allergy status: Secondary | ICD-10-CM | POA: Insufficient documentation

## 2015-08-31 DIAGNOSIS — R471 Dysarthria and anarthria: Secondary | ICD-10-CM | POA: Insufficient documentation

## 2015-08-31 DIAGNOSIS — G44209 Tension-type headache, unspecified, not intractable: Secondary | ICD-10-CM | POA: Diagnosis not present

## 2015-08-31 DIAGNOSIS — Z8673 Personal history of transient ischemic attack (TIA), and cerebral infarction without residual deficits: Secondary | ICD-10-CM | POA: Diagnosis not present

## 2015-08-31 DIAGNOSIS — G459 Transient cerebral ischemic attack, unspecified: Secondary | ICD-10-CM | POA: Diagnosis present

## 2015-08-31 DIAGNOSIS — R531 Weakness: Secondary | ICD-10-CM | POA: Insufficient documentation

## 2015-08-31 DIAGNOSIS — J449 Chronic obstructive pulmonary disease, unspecified: Secondary | ICD-10-CM | POA: Diagnosis not present

## 2015-08-31 DIAGNOSIS — E1122 Type 2 diabetes mellitus with diabetic chronic kidney disease: Secondary | ICD-10-CM | POA: Insufficient documentation

## 2015-08-31 LAB — DIFFERENTIAL
Basophils Absolute: 0.1 10*3/uL (ref 0–0.1)
Basophils Relative: 1 %
Eosinophils Absolute: 0.3 10*3/uL (ref 0–0.7)
Eosinophils Relative: 4 %
Lymphocytes Relative: 36 %
Lymphs Abs: 3 10*3/uL (ref 1.0–3.6)
Monocytes Absolute: 0.6 10*3/uL (ref 0.2–0.9)
Monocytes Relative: 8 %
Neutro Abs: 4.3 10*3/uL (ref 1.4–6.5)
Neutrophils Relative %: 51 %

## 2015-08-31 LAB — COMPREHENSIVE METABOLIC PANEL WITH GFR
ALT: 34 U/L (ref 14–54)
AST: 37 U/L (ref 15–41)
Albumin: 3.7 g/dL (ref 3.5–5.0)
Alkaline Phosphatase: 113 U/L (ref 38–126)
Anion gap: 8 (ref 5–15)
BUN: 32 mg/dL — ABNORMAL HIGH (ref 6–20)
CO2: 24 mmol/L (ref 22–32)
Calcium: 9.2 mg/dL (ref 8.9–10.3)
Chloride: 109 mmol/L (ref 101–111)
Creatinine, Ser: 1.54 mg/dL — ABNORMAL HIGH (ref 0.44–1.00)
GFR calc Af Amer: 42 mL/min — ABNORMAL LOW
GFR calc non Af Amer: 37 mL/min — ABNORMAL LOW
Glucose, Bld: 120 mg/dL — ABNORMAL HIGH (ref 65–99)
Potassium: 4.2 mmol/L (ref 3.5–5.1)
Sodium: 141 mmol/L (ref 135–145)
Total Bilirubin: 0.4 mg/dL (ref 0.3–1.2)
Total Protein: 7.4 g/dL (ref 6.5–8.1)

## 2015-08-31 LAB — CBC
HCT: 30.9 % — ABNORMAL LOW (ref 35.0–47.0)
Hemoglobin: 9.9 g/dL — ABNORMAL LOW (ref 12.0–16.0)
MCH: 24.3 pg — ABNORMAL LOW (ref 26.0–34.0)
MCHC: 31.9 g/dL — ABNORMAL LOW (ref 32.0–36.0)
MCV: 76 fL — ABNORMAL LOW (ref 80.0–100.0)
Platelets: 424 10*3/uL (ref 150–440)
RBC: 4.07 MIL/uL (ref 3.80–5.20)
RDW: 21.5 % — ABNORMAL HIGH (ref 11.5–14.5)
WBC: 8.4 10*3/uL (ref 3.6–11.0)

## 2015-08-31 LAB — PROTIME-INR
INR: 0.92
Prothrombin Time: 12.6 s (ref 11.4–15.0)

## 2015-08-31 LAB — APTT: aPTT: 30 seconds (ref 24–36)

## 2015-08-31 LAB — GLUCOSE, CAPILLARY: GLUCOSE-CAPILLARY: 134 mg/dL — AB (ref 65–99)

## 2015-08-31 LAB — TROPONIN I

## 2015-08-31 MED ORDER — CLOPIDOGREL BISULFATE 75 MG PO TABS
75.0000 mg | ORAL_TABLET | Freq: Every day | ORAL | Status: DC
  Administered 2015-08-31 – 2015-09-02 (×3): 75 mg via ORAL
  Filled 2015-08-31 (×3): qty 1

## 2015-08-31 MED ORDER — POTASSIUM CHLORIDE ER 10 MEQ PO TBCR
10.0000 meq | EXTENDED_RELEASE_TABLET | Freq: Every day | ORAL | Status: DC
  Administered 2015-09-01 – 2015-09-02 (×2): 10 meq via ORAL
  Filled 2015-08-31 (×5): qty 1

## 2015-08-31 MED ORDER — SIMVASTATIN 40 MG PO TABS
40.0000 mg | ORAL_TABLET | Freq: Every day | ORAL | Status: DC
  Administered 2015-08-31: 23:00:00 40 mg via ORAL
  Filled 2015-08-31: qty 1

## 2015-08-31 MED ORDER — INSULIN ASPART 100 UNIT/ML ~~LOC~~ SOLN: 0.0000 [IU] | Freq: Every day | SUBCUTANEOUS | Status: DC

## 2015-08-31 MED ORDER — LORATADINE 10 MG PO TABS
10.0000 mg | ORAL_TABLET | Freq: Every day | ORAL | Status: DC
  Administered 2015-09-01 – 2015-09-02 (×2): 10 mg via ORAL
  Filled 2015-08-31 (×2): qty 1

## 2015-08-31 MED ORDER — L-METHYLFOLATE-B6-B12 3-35-2 MG PO TABS: 1.0000 | ORAL_TABLET | Freq: Two times a day (BID) | ORAL | Status: DC

## 2015-08-31 MED ORDER — DOCUSATE SODIUM 100 MG PO CAPS
100.0000 mg | ORAL_CAPSULE | Freq: Two times a day (BID) | ORAL | Status: DC
  Administered 2015-08-31 – 2015-09-02 (×4): 100 mg via ORAL
  Filled 2015-08-31 (×4): qty 1

## 2015-08-31 MED ORDER — FOLIC ACID 1 MG PO TABS
1.0000 mg | ORAL_TABLET | Freq: Every day | ORAL | Status: DC
  Administered 2015-09-01 – 2015-09-02 (×2): 1 mg via ORAL
  Filled 2015-08-31 (×2): qty 1

## 2015-08-31 MED ORDER — ONDANSETRON HCL 4 MG/2ML IJ SOLN: 4.0000 mg | Freq: Four times a day (QID) | INTRAMUSCULAR | Status: DC | PRN

## 2015-08-31 MED ORDER — ZOLPIDEM TARTRATE 5 MG PO TABS: 10.0000 mg | ORAL_TABLET | Freq: Every evening | ORAL | Status: DC | PRN

## 2015-08-31 MED ORDER — INSULIN ASPART 100 UNIT/ML ~~LOC~~ SOLN
0.0000 [IU] | Freq: Three times a day (TID) | SUBCUTANEOUS | Status: DC
  Administered 2015-09-01 – 2015-09-02 (×4): 1 [IU] via SUBCUTANEOUS
  Filled 2015-08-31 (×4): qty 1

## 2015-08-31 MED ORDER — CLONIDINE HCL 0.1 MG PO TABS
0.1000 mg | ORAL_TABLET | Freq: Every day | ORAL | Status: DC
  Administered 2015-09-01: 21:00:00 0.1 mg via ORAL
  Filled 2015-08-31 (×2): qty 1

## 2015-08-31 MED ORDER — OXYCODONE HCL 5 MG PO TABS
5.0000 mg | ORAL_TABLET | Freq: Four times a day (QID) | ORAL | Status: DC | PRN
  Administered 2015-08-31 – 2015-09-02 (×7): 5 mg via ORAL
  Filled 2015-08-31 (×7): qty 1

## 2015-08-31 MED ORDER — ALPRAZOLAM 0.5 MG PO TABS
1.0000 mg | ORAL_TABLET | Freq: Two times a day (BID) | ORAL | Status: DC
  Administered 2015-08-31: 23:00:00 1.5 mg via ORAL
  Administered 2015-09-01: 1 mg via ORAL
  Administered 2015-09-01 – 2015-09-02 (×2): 1.5 mg via ORAL
  Filled 2015-08-31: qty 2
  Filled 2015-08-31 (×3): qty 3

## 2015-08-31 MED ORDER — METOPROLOL SUCCINATE ER 100 MG PO TB24
100.0000 mg | ORAL_TABLET | Freq: Every day | ORAL | Status: DC
  Administered 2015-09-01 – 2015-09-02 (×2): 100 mg via ORAL
  Filled 2015-08-31 (×2): qty 1

## 2015-08-31 MED ORDER — ALBUTEROL SULFATE (2.5 MG/3ML) 0.083% IN NEBU: 2.5000 mg | INHALATION_SOLUTION | RESPIRATORY_TRACT | Status: DC | PRN

## 2015-08-31 MED ORDER — MIRTAZAPINE 45 MG PO TABS
45.0000 mg | ORAL_TABLET | Freq: Every day | ORAL | Status: DC
  Administered 2015-08-31 – 2015-09-01 (×2): 45 mg via ORAL
  Filled 2015-08-31 (×4): qty 1

## 2015-08-31 MED ORDER — ONDANSETRON HCL 4 MG PO TABS: 4.0000 mg | ORAL_TABLET | Freq: Four times a day (QID) | ORAL | Status: DC | PRN

## 2015-08-31 MED ORDER — VENLAFAXINE HCL ER 37.5 MG PO CP24
37.5000 mg | ORAL_CAPSULE | Freq: Every day | ORAL | Status: DC
  Administered 2015-09-01 – 2015-09-02 (×2): 37.5 mg via ORAL
  Filled 2015-08-31 (×3): qty 1

## 2015-08-31 MED ORDER — HYDROCHLOROTHIAZIDE 12.5 MG PO CAPS
12.5000 mg | ORAL_CAPSULE | Freq: Every day | ORAL | Status: DC
  Administered 2015-09-01 – 2015-09-02 (×2): 12.5 mg via ORAL
  Filled 2015-08-31 (×2): qty 1

## 2015-08-31 MED ORDER — SODIUM CHLORIDE 0.9 % IJ SOLN
3.0000 mL | Freq: Two times a day (BID) | INTRAMUSCULAR | Status: DC
  Administered 2015-08-31 – 2015-09-02 (×4): 3 mL via INTRAVENOUS

## 2015-08-31 MED ORDER — ENOXAPARIN SODIUM 40 MG/0.4ML ~~LOC~~ SOLN
40.0000 mg | Freq: Two times a day (BID) | SUBCUTANEOUS | Status: DC
  Administered 2015-08-31 – 2015-09-02 (×4): 40 mg via SUBCUTANEOUS
  Filled 2015-08-31 (×4): qty 0.4

## 2015-08-31 MED ORDER — HYDROXYZINE HCL 25 MG PO TABS
25.0000 mg | ORAL_TABLET | Freq: Three times a day (TID) | ORAL | Status: DC
  Administered 2015-08-31 – 2015-09-02 (×6): 25 mg via ORAL
  Filled 2015-08-31 (×6): qty 1

## 2015-08-31 MED ORDER — BACLOFEN 10 MG PO TABS
10.0000 mg | ORAL_TABLET | Freq: Three times a day (TID) | ORAL | Status: DC
  Administered 2015-08-31 – 2015-09-01 (×2): 10 mg via ORAL
  Filled 2015-08-31 (×2): qty 1

## 2015-08-31 MED ORDER — IRBESARTAN 75 MG PO TABS
300.0000 mg | ORAL_TABLET | Freq: Every day | ORAL | Status: DC
  Administered 2015-09-01 – 2015-09-02 (×2): 300 mg via ORAL
  Filled 2015-08-31 (×2): qty 4

## 2015-08-31 MED ORDER — PHENDIMETRAZINE TARTRATE 35 MG PO TABS: 35.0000 mg | ORAL_TABLET | Freq: Three times a day (TID) | ORAL | Status: DC

## 2015-08-31 MED ORDER — FLUTICASONE PROPIONATE 50 MCG/ACT NA SUSP
1.0000 | Freq: Two times a day (BID) | NASAL | Status: DC
  Administered 2015-08-31 – 2015-09-01 (×3): 1 via NASAL
  Filled 2015-08-31 (×2): qty 16

## 2015-08-31 MED ORDER — CALCIUM CARBONATE ANTACID 500 MG PO CHEW
500.0000 mg | CHEWABLE_TABLET | Freq: Two times a day (BID) | ORAL | Status: DC
  Administered 2015-08-31 – 2015-09-02 (×4): 500 mg via ORAL
  Filled 2015-08-31 (×4): qty 1

## 2015-08-31 MED ORDER — FENTANYL 50 MCG/HR TD PT72
50.0000 ug | MEDICATED_PATCH | TRANSDERMAL | Status: DC
  Administered 2015-08-31: 21:00:00 50 ug via TRANSDERMAL
  Filled 2015-08-31: qty 1

## 2015-08-31 MED ORDER — ACETAMINOPHEN 325 MG PO TABS
650.0000 mg | ORAL_TABLET | Freq: Four times a day (QID) | ORAL | Status: DC | PRN
  Filled 2015-08-31: qty 2

## 2015-08-31 MED ORDER — BENZONATATE 100 MG PO CAPS: 100.0000 mg | ORAL_CAPSULE | Freq: Three times a day (TID) | ORAL | Status: DC | PRN

## 2015-08-31 MED ORDER — ACETAMINOPHEN 650 MG RE SUPP: 650.0000 mg | Freq: Four times a day (QID) | RECTAL | Status: DC | PRN

## 2015-08-31 MED ORDER — ALBUTEROL SULFATE (2.5 MG/3ML) 0.083% IN NEBU: 2.5000 mg | INHALATION_SOLUTION | Freq: Four times a day (QID) | RESPIRATORY_TRACT | Status: DC | PRN

## 2015-08-31 MED ORDER — OLMESARTAN MEDOXOMIL-HCTZ 40-12.5 MG PO TABS: 1.0000 | ORAL_TABLET | Freq: Every day | ORAL | Status: DC

## 2015-08-31 MED ORDER — OXYBUTYNIN CHLORIDE 5 MG PO TABS
5.0000 mg | ORAL_TABLET | Freq: Two times a day (BID) | ORAL | Status: DC
  Administered 2015-08-31 – 2015-09-02 (×4): 5 mg via ORAL
  Filled 2015-08-31 (×4): qty 1

## 2015-08-31 NOTE — Progress Notes (Signed)
*  PRELIMINARY RESULTS* Echocardiogram 2D Echocardiogram has been performed.  Lindsey [name] 08/31/2015, 7:48 PM

## 2015-08-31 NOTE — ED Notes (Signed)
Patient transported to CT 

## 2015-08-31 NOTE — Progress Notes (Signed)
ANTICOAGULATION CONSULT NOTE - Initial Consult  Pharmacy Consult for Lovenox  Indication: VTE prophylaxis  Allergies  Allergen Reactions  . Morphine Hives  . Iodinated Diagnostic Agents Hives  . Etodolac Hives  . Ibuprofen Hives  . Shellfish Allergy Hives    Patient Measurements: Height: 5\' 7"  (170.2 cm) Weight: 296 lb 9 oz (134.52 kg) IBW/kg (Calculated) : 61.6 Heparin Dosing Weight:   Vital Signs: Temp: 97.6 F (36.4 C) (10/04 1747) Temp Source: Oral (10/04 1747) BP: 121/74 mmHg (10/04 1747) Pulse Rate: 77 (10/04 1747)  Labs:  Recent Labs  08/31/15 1435  HGB 9.9*  HCT 30.9*  PLT 424  APTT 30  LABPROT 12.6  INR 0.92  CREATININE 1.54*  TROPONINI <0.03    Estimated Creatinine Clearance: 58.5 mL/min (by C-G formula based on Cr of 1.54).   Medical History: Past Medical History  Diagnosis Date  . Diabetes mellitus without complication ([place])   . Stroke ([place])   . Hypertension   . Allergy   . COPD (chronic obstructive pulmonary disease) ([place])   . Sleep apnea   . Hyperlipidemia   . Heart attack ([place])   . GERD (gastroesophageal reflux disease)     Medications:  Prescriptions prior to admission  Medication Sig Dispense Refill Last Dose  . albuterol (PROVENTIL HFA;VENTOLIN HFA) 108 (90 BASE) MCG/ACT inhaler Inhale 2 puffs into the lungs every 4 (four) hours as needed for wheezing or shortness of breath.   08/31/2015 at Unknown time  . albuterol (PROVENTIL) (2.5 MG/3ML) 0.083% nebulizer solution Take 2.5 mg by nebulization every 6 (six) hours as needed for wheezing or shortness of breath.   PRN at PRN  . ALPRAZolam (XANAX) 1 MG tablet Take 1-1.5 mg by mouth 2 (two) times daily. Pt takes one tablet in the morning and one and one-half tablet at night.   08/31/2015 at Unknown time  . baclofen (LIORESAL) 10 MG tablet Take 10 mg by mouth 3 (three) times daily.   08/31/2015 at Unknown time  . benzonatate (TESSALON) 100 MG capsule Take 100 mg by mouth 3 (three) times daily  as needed for cough.   08/31/2015 at Unknown time  . Calcium Lactate 648 MG TABS Take 648 mg by mouth 2 (two) times daily.   08/31/2015 at Unknown time  . cloNIDine (CATAPRES) 0.1 MG tablet Take 0.1 mg by mouth at bedtime.   08/30/2015 at Unknown time  . desvenlafaxine (PRISTIQ) 100 MG 24 hr tablet Take 100 mg by mouth daily.   08/31/2015 at Unknown time  . esomeprazole (NEXIUM) 40 MG capsule Take 40 mg by mouth 2 (two) times daily.   08/31/2015 at Unknown time  . fentaNYL (DURAGESIC - DOSED MCG/HR) 50 MCG/HR Place 50 mcg onto the skin every 3 (three) days.   08/28/2015 at unknown  . fexofenadine (ALLEGRA) 60 MG tablet Take 60 mg by mouth 2 (two) times daily.   08/31/2015 at Unknown time  . fluticasone (FLONASE) 50 MCG/ACT nasal spray Place 1 spray into both nostrils 2 (two) times daily.   08/31/2015 at Unknown time  . folic acid (FOLVITE) 1 MG tablet Take 1 mg by mouth daily.   08/31/2015 at Unknown time  . furosemide (LASIX) 20 MG tablet Take 20 mg by mouth daily.    08/31/2015 at Unknown time  . hydrOXYzine (ATARAX/VISTARIL) 25 MG tablet Take 25 mg by mouth 3 (three) times daily.   08/31/2015 at Unknown time  . l-methylfolate-B6-B12 (METANX) 3-35-2 MG TABS tablet Take 1 tablet by mouth  2 (two) times daily.   08/31/2015 at Unknown time  . metoprolol succinate (TOPROL-XL) 100 MG 24 hr tablet Take 100 mg by mouth daily.   08/31/2015 at 0800  . mirtazapine (REMERON) 45 MG tablet Take 45 mg by mouth at bedtime.   08/30/2015 at Unknown time  . olmesartan-hydrochlorothiazide (BENICAR HCT) 40-12.5 MG tablet Take 1 tablet by mouth daily.   08/31/2015 at Unknown time  . oxybutynin (DITROPAN) 5 MG tablet Take 5 mg by mouth 2 (two) times daily.   08/31/2015 at Unknown time  . oxyCODONE (OXY IR/ROXICODONE) 5 MG immediate release tablet Take 5 mg by mouth every 6 (six) hours as needed for severe pain.   08/31/2015 at 1200  . Phendimetrazine Tartrate 35 MG TABS Take 35 mg by mouth 3 (three) times daily.   08/31/2015 at Unknown  time  . potassium chloride (K-DUR) 10 MEQ tablet Take 10 mEq by mouth daily.   08/31/2015 at Unknown time  . simvastatin (ZOCOR) 40 MG tablet Take 40 mg by mouth at bedtime.   08/30/2015 at Unknown time  . zolpidem (AMBIEN) 10 MG tablet Take 10 mg by mouth at bedtime as needed for sleep.   08/30/2015 at Unknown time    Assessment: BMI = 44.9 CrCl = 58.5 ml/min  Goal of Therapy:  DVT prophylaxis   Plan:  Lovenox 40 mg SQ Q24H originally ordered.  Will adjust dose to lovenox 40 mg SQ Q12H based on BMI > 40.   Michiel Sivley D 08/31/2015,6:47 PM

## 2015-08-31 NOTE — H&P (Signed)
[facility] NAME: Lindsey [name]    MR#:  [id number]  DATE OF BIRTH:  1961/05/26  DATE OF ADMISSION:  08/31/2015  PRIMARY CARE PHYSICIAN: Volanda Napoleon, MD   REQUESTING/REFERRING PHYSICIAN: Dr.Malinda  CHIEF COMPLAINT:  Left-sided weakness with dysarthria  HISTORY OF PRESENT ILLNESS:  Lindsey [name]  is a 54 y.o. female with a known history of coronary artery disease, old history of CVA with no residual deficits, chronic COPD, hypertension and diabetes mellitus is presenting to the ED with a chief complaint of a 3 day history of a severe headache and left-sided weakness associated with dysarthria from 6 AM today.. Had difficulty with her speech was temporarily resolved but it recurred back. Patient came into the ED, speech is clearing up, CT head is normal but still feeling weak on the left side. Headache is significantly improved. Denies any dizziness or loss of consciousness. Denies any blurry vision. Denies any difficulty with swallowing.  PAST MEDICAL HISTORY:   Past Medical History  Diagnosis Date  . Diabetes mellitus without complication ([place])   . Stroke ([place])   . Hypertension   . Allergy   . COPD (chronic obstructive pulmonary disease) ([place])   . Sleep apnea   . Hyperlipidemia   . Heart attack ([place])   . GERD (gastroesophageal reflux disease)     PAST SURGICAL HISTOIRY:   Past Surgical History  Procedure Laterality Date  . Gastric bypass    . Replacement total knee    . Rotator cuff repair    . Gallbladder surgery    . Hernia repair    . Back surgery    . Broken wrist    . Abdominal hysterectomy      SOCIAL HISTORY:   Social History  Substance Use Topics  . Smoking status: Never Smoker   . Smokeless tobacco: Never Used  . Alcohol Use: No    FAMILY HISTORY:  No family history on file.  DRUG ALLERGIES:   Allergies  Allergen Reactions  . Morphine Hives  . Iodinated Diagnostic  Agents Hives  . Etodolac Hives  . Ibuprofen Hives  . Shellfish Allergy Hives    REVIEW OF SYSTEMS:  CONSTITUTIONAL: No fever, fatigue or weakness.  EYES: No blurred or double vision. Reporting headache EARS, NOSE, AND THROAT: No tinnitus or ear pain.  RESPIRATORY: No cough, shortness of breath, wheezing or hemoptysis.  CARDIOVASCULAR: No chest pain, orthopnea, edema.  GASTROINTESTINAL: No nausea, vomiting, diarrhea or abdominal pain.  GENITOURINARY: No dysuria, hematuria.  ENDOCRINE: No polyuria, nocturia,  HEMATOLOGY: No anemia, easy bruising or bleeding SKIN: No rash or lesion. MUSCULOSKELETAL: No joint pain or arthritis.   NEUROLOGIC: No tingling, numbness. Reporting left-sided weakness and stuttering speech PSYCHIATRY: No anxiety or depression.   MEDICATIONS AT HOME:   Prior to Admission medications   Medication Sig Start Date End Date Taking? Authorizing Provider  albuterol (PROVENTIL HFA;VENTOLIN HFA) 108 (90 BASE) MCG/ACT inhaler Inhale 2 puffs into the lungs every 4 (four) hours as needed for wheezing or shortness of breath.   Yes Historical Provider, MD  albuterol (PROVENTIL) (2.5 MG/3ML) 0.083% nebulizer solution Take 2.5 mg by nebulization every 6 (six) hours as needed for wheezing or shortness of breath.   Yes Historical Provider, MD  ALPRAZolam Duanne Moron) 1 MG tablet Take 1-1.5 mg by mouth 2 (two) times daily. Pt takes one tablet in the morning and one and one-half tablet at night.   Yes Historical Provider, MD  baclofen (LIORESAL) 10 MG tablet Take 10 mg by mouth 3 (three) times daily.   Yes Historical Provider, MD  benzonatate (TESSALON) 100 MG capsule Take 100 mg by mouth 3 (three) times daily as needed for cough.   Yes Historical Provider, MD  Calcium Lactate 648 MG TABS Take 648 mg by mouth 2 (two) times daily.   Yes Historical Provider, MD  cloNIDine (CATAPRES) 0.1 MG tablet Take 0.1 mg by mouth at bedtime.   Yes Historical Provider, MD  desvenlafaxine (PRISTIQ) 100 MG  24 hr tablet Take 100 mg by mouth daily.   Yes Historical Provider, MD  esomeprazole (NEXIUM) 40 MG capsule Take 40 mg by mouth 2 (two) times daily.   Yes Historical Provider, MD  fentaNYL (DURAGESIC - DOSED MCG/HR) 50 MCG/HR Place 50 mcg onto the skin every 3 (three) days.   Yes Historical Provider, MD  fexofenadine (ALLEGRA) 60 MG tablet Take 60 mg by mouth 2 (two) times daily.   Yes Historical Provider, MD  fluticasone (FLONASE) 50 MCG/ACT nasal spray Place 1 spray into both nostrils 2 (two) times daily.   Yes Historical Provider, MD  folic acid (FOLVITE) 1 MG tablet Take 1 mg by mouth daily.   Yes Historical Provider, MD  furosemide (LASIX) 20 MG tablet Take 20 mg by mouth daily.    Yes Historical Provider, MD  hydrOXYzine (ATARAX/VISTARIL) 25 MG tablet Take 25 mg by mouth 3 (three) times daily.   Yes Historical Provider, MD  l-methylfolate-B6-B12 (METANX) 3-35-2 MG TABS tablet Take 1 tablet by mouth 2 (two) times daily.   Yes Historical Provider, MD  metoprolol succinate (TOPROL-XL) 100 MG 24 hr tablet Take 100 mg by mouth daily.   Yes Historical Provider, MD  mirtazapine (REMERON) 45 MG tablet Take 45 mg by mouth at bedtime.   Yes Historical Provider, MD  olmesartan-hydrochlorothiazide (BENICAR HCT) 40-12.5 MG tablet Take 1 tablet by mouth daily.   Yes Historical Provider, MD  oxybutynin (DITROPAN) 5 MG tablet Take 5 mg by mouth 2 (two) times daily.   Yes Historical Provider, MD  oxyCODONE (OXY IR/ROXICODONE) 5 MG immediate release tablet Take 5 mg by mouth every 6 (six) hours as needed for severe pain.   Yes Historical Provider, MD  Phendimetrazine Tartrate 35 MG TABS Take 35 mg by mouth 3 (three) times daily.   Yes Historical Provider, MD  potassium chloride (K-DUR) 10 MEQ tablet Take 10 mEq by mouth daily.   Yes Historical Provider, MD  simvastatin (ZOCOR) 40 MG tablet Take 40 mg by mouth at bedtime.   Yes Historical Provider, MD  zolpidem (AMBIEN) 10 MG tablet Take 10 mg by mouth at  bedtime as needed for sleep.   Yes Historical Provider, MD      VITAL SIGNS:  Blood pressure 112/67, pulse 61, temperature 98.4 F (36.9 C), temperature source Oral, resp. rate 13, height 5\' 7"  (1.702 m), weight 129.729 kg (286 lb), SpO2 100 %.  PHYSICAL EXAMINATION:  GENERAL:  54 y.o.-year-old patient lying in the bed with no acute distress. Obese EYES: Pupils equal, round, reactive to light and accommodation. No scleral icterus. Extraocular muscles intact.  HEENT: Head atraumatic, normocephalic. Oropharynx and nasopharynx clear.  NECK:  Supple, no jugular venous distention. No thyroid enlargement, no tenderness.  LUNGS: Normal breath sounds bilaterally, no wheezing, rales,rhonchi or crepitation. No use of accessory muscles of respiration.  CARDIOVASCULAR: S1, S2 normal. No murmurs, rubs, or gallops.  ABDOMEN: Soft, nontender, nondistended. Bowel sounds present. No organomegaly or mass.  EXTREMITIES: No pedal edema, cyanosis, or clubbing.  NEUROLOGIC: Cranial nerves II through XII are intact. Muscle strength 5/5 in right extremitie.strength is 4 out of 5 on left upper extent and lower extremity.   Sensation intact. Gait not checked.  no deviation of the angle of the mouth. Reflexes are 2+ PSYCHIATRIC: The patient is alert and oriented x 3.  SKIN: No obvious rash, lesion, or ulcer.   LABORATORY PANEL:   CBC  Recent Labs Lab 08/31/15 1435  WBC 8.4  HGB 9.9*  HCT 30.9*  PLT 424   ------------------------------------------------------------------------------------------------------------------  Chemistries   Recent Labs Lab 08/31/15 1435  NA 141  K 4.2  CL 109  CO2 24  GLUCOSE 120*  BUN 32*  CREATININE 1.54*  CALCIUM 9.2  AST 37  ALT 34  ALKPHOS 113  BILITOT 0.4   ------------------------------------------------------------------------------------------------------------------  Cardiac Enzymes  Recent Labs Lab 08/31/15 1435  TROPONINI <0.03    ------------------------------------------------------------------------------------------------------------------  RADIOLOGY:  Ct Head Wo Contrast  08/31/2015   CLINICAL DATA:  Headache with left-sided numbness and weakness and slurred speech this morning.  EXAM: CT HEAD WITHOUT CONTRAST  TECHNIQUE: Contiguous axial images were obtained from the base of the skull through the vertex without intravenous contrast.  COMPARISON:  CT scan dated 06/29/2014  FINDINGS: No mass lesion. No midline shift. No acute hemorrhage or hematoma. No extra-axial fluid collections. No evidence of acute infarction. Brain parenchyma is normal. Slight mucosal thickening in the ethmoid and right maxillary sinuses. No air-fluid levels.  IMPRESSION: 1. The brain appears normal. 2. Slight mucosal thickening in the paranasal sinuses, most likely not acute.   Electronically Signed   By: Lorriane Shire M.D.   On: 08/31/2015 14:52    EKG:   Orders placed or performed during the hospital encounter of 08/31/15  . ED EKG  . ED EKG  . EKG 12-Lead  . EKG 12-Lead    IMPRESSION AND PLAN:   Lindsey [name]  is a 54 y.o. female with a known history of coronary artery disease, old history of CVA with no residual deficits, chronic COPD, hypertension and diabetes mellitus is presenting to the ED with a chief complaint of a 3 day history of a severe headache and left-sided weakness associated with dysarthria from 6 AM today.. Had difficulty with her speech was temporarily resolved but it recurred back. Patient came into the ED, speech is clearing up, CT head is normal but still feeling weak on the left side.  1. Left-sided weakness with dysarthria secondary to TIA with past medical history of stroke with no residual deficits Admit patient to off unit telemetry under observation status CT head is negative Stroke workup with carotid Dopplers, 2-D echocardiogram and MRI of the brain is ordered Neuro checks every 2 hours for the next 12  hours Patient is allergic to aspirin gives her hives We'll start her on Plavix and continue statin Check fasting lipid panel in a.m. PT consult is placed Will get bedside swallow evaluation   2. Acute kidney injury- Hold Lasix Avoid nephrotoxins and continue close monitoring of the renal function.  3. Chronic history of diabetes mellitus status post gastric bypass and lost 350 pounds Currently not on any diabetic medications We will get Accu-Cheks and place her on sliding scale insulin  4. Chronic history of hypertension Blood pressure is stable. Continue Lopressor and clonidine  5 history of coronary artery disease status post heart attack Patient is Dr. Darrow Bussing as an outpatient, currently asymptomatic Patient is allergic to  aspirin Will continue statin and beta blocker    All the records are reviewed and case discussed with ED provider. Management plans discussed with the patient, and she is  in agreement. More than 50% time was spent on face-to-face counseling and coordination of care  CODE STATUS: Full code, son is the healthcare power of attorney  TOTAL TIME TAKING CARE OF THIS PATIENT: 45 minutes.    Nicholes Mango M.D on 08/31/2015 at 4:11 PM  Between 7am to 6pm - Pager - [phone]  After 6pm go to [email or web address] - password [email or web address] [facility]  Office  [phone]  CC: Primary care physician; Volanda Napoleon, MD

## 2015-08-31 NOTE — ED Notes (Signed)
Pt was sent from PCP for possible stroke.the patient states she was fine other then a HA for the past 2-3 days.Marland Kitchenthen when she woke this morning she c/o HA with left sided numbness and weakness, states she had slurred speech.the patient speech is normal in triage, no facial droop noted.

## 2015-08-31 NOTE — ED Provider Notes (Signed)
[facility] Emergency Department Provider Note  ____________________________________________  Time seen: Approximately 3:08 PM  I have reviewed the triage vital signs and the nursing notes.   HISTORY  Chief Complaint Cerebrovascular Accident    HPI Lindsey [name] is a 54 y.o. female patient reports she's had a bad headache for 3 days, achy throbbing right side. Patient also reports a lot of green nasal discharge. No fever. She reports she woke up this morning and had left-sided weakness and slurred speech. This appeared to have gotten better just prior to going to CT and is getting worse again. She does have some slurry speech and does seem to be somewhat weak in the left arm. Although she does not have any drift on that as tested. Patient reports a history of prior strokes in reviewing the old record these appear to have been TIAs. Patient has diabetes and hypertension   Past Medical History  Diagnosis Date  . Diabetes mellitus without complication ([place])   . Stroke ([place])   . Hypertension   . Allergy   . COPD (chronic obstructive pulmonary disease) ([place])   . Sleep apnea   . Hyperlipidemia   . Heart attack ([place])   . GERD (gastroesophageal reflux disease)     Patient Active Problem List   Diagnosis Date Noted  . 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 Disease of female genital organs 04/01/2013  . Difficult or painful urination 03/18/2013  . Urge incontinence 03/18/2013  . Urgency of micturation 03/18/2013    Past Surgical History  Procedure Laterality Date  . Gastric bypass    . Replacement total knee    . Rotator cuff repair    . Gallbladder surgery    . Hernia repair    . Back surgery    . Broken wrist    . Abdominal hysterectomy      Current Outpatient Rx  Name  Route  Sig  Dispense  Refill  . ALPRAZolam (XANAX) 1 MG tablet   Oral   Take 1-1.5 mg by mouth 2 (two) times daily. Pt takes one tablet  in the morning and one and one-half tablet at night.         . baclofen (LIORESAL) 10 MG tablet   Oral   Take 10 mg by mouth 3 (three) times daily.         . benzonatate (TESSALON) 100 MG capsule   Oral   Take 100 mg by mouth 3 (three) times daily as needed for cough.         . Calcium Lactate 648 MG TABS   Oral   Take 648 mg by mouth 2 (two) times daily.         . cloNIDine (CATAPRES) 0.1 MG tablet   Oral   Take 0.1 mg by mouth at bedtime.         Marland Kitchen desvenlafaxine (PRISTIQ) 100 MG 24 hr tablet   Oral   Take 100 mg by mouth daily.         . fentaNYL (DURAGESIC - DOSED MCG/HR) 50 MCG/HR   Transdermal   Place 50 mcg onto the skin every 3 (three) days.         . fexofenadine (ALLEGRA) 60 MG tablet   Oral   Take 60 mg by mouth 2 (two) times daily.         . fluticasone (FLONASE) 50 MCG/ACT nasal spray   Each Nare   Place 1 spray  into both nostrils 2 (two) times daily.         . furosemide (LASIX) 20 MG tablet   Oral   Take 20 mg by mouth daily.          . mirtazapine (REMERON) 45 MG tablet   Oral   Take 45 mg by mouth at bedtime.         Marland Kitchen oxybutynin (DITROPAN) 5 MG tablet   Oral   Take 5 mg by mouth 2 (two) times daily.         Marland Kitchen oxyCODONE (OXY IR/ROXICODONE) 5 MG immediate release tablet   Oral   Take 5 mg by mouth every 6 (six) hours as needed for severe pain.         Marland Kitchen Phendimetrazine Tartrate 35 MG TABS   Oral   Take 35 mg by mouth 3 (three) times daily.         . simvastatin (ZOCOR) 40 MG tablet   Oral   Take 40 mg by mouth at bedtime.         Marland Kitchen zolpidem (AMBIEN) 10 MG tablet   Oral   Take 10 mg by mouth at bedtime as needed for sleep.         Marland Kitchen Desvenlafaxine ER 100 MG TB24               . esomeprazole (NEXIUM) 40 MG capsule   Oral   Take 40 mg by mouth 2 (two) times daily.      0   . folic acid (FOLVITE) 1 MG tablet            0   . hydrOXYzine (ATARAX/VISTARIL) 25 MG tablet            0   .  l-methylfolate-B6-B12 (METANX) 3-35-2 MG TABS            0   . metoprolol succinate (TOPROL-XL) 100 MG 24 hr tablet            0   . potassium chloride (K-DUR) 10 MEQ tablet   Oral   Take 10 mEq by mouth every morning.      0   . PROAIR HFA 108 (90 BASE) MCG/ACT inhaler            0     Dispense as written.   Marland Kitchen tiZANidine (ZANAFLEX) 2 MG tablet            0     Allergies Morphine; Iodinated diagnostic agents; Etodolac; Ibuprofen; and Shellfish allergy  No family history on file.  Social History Social History  Substance Use Topics  . Smoking status: Never Smoker   . Smokeless tobacco: Never Used  . Alcohol Use: No    Review of Systems Constitutional: No fever/chills Eyes: No visual changes. ENT: No sore throat. Cardiovascular: Denies chest pain. Respiratory: Denies shortness of breath. Gastrointestinal: No abdominal pain.  No nausea, no vomiting.  No diarrhea.  No constipation. Genitourinary: Negative for dysuria. Musculoskeletal: Negative for back pain. Skin: Negative for rash. Neurological: Negative for headaches, focal weakness or numbness.  10-point ROS otherwise negative.  ____________________________________________   PHYSICAL EXAM:  VITAL SIGNS: ED Triage Vitals  Enc Vitals Group     BP 08/31/15 1417 104/58 mmHg     Pulse Rate 08/31/15 1417 72     Resp 08/31/15 1417 18     Temp 08/31/15 1417 97.7 F (36.5 C)     Temp Source 08/31/15 1417 Oral  SpO2 08/31/15 1417 96 %     Weight 08/31/15 1417 286 lb (129.729 kg)     Height 08/31/15 1417 5\' 7"  (1.702 m)     Head Cir --      Peak Flow --      Pain Score 08/31/15 1418 10     Pain Loc --      Pain Edu? --      Excl. in [place]? --     Constitutional: Alert and oriented. Well appearing and in no acute distress. Eyes: Conjunctivae are normal. PERRL. EOMI. Head: Atraumatic. Nose: No congestion/rhinnorhea. Mouth/Throat: Mucous membranes are moist.  Oropharynx  non-erythematous. Neck: No stridor. Cardiovascular: Normal rate, regular rhythm. Grossly normal heart sounds.  Good peripheral circulation. Respiratory: Normal respiratory effort.  No retractions. Lungs CTAB. Gastrointestinal: Soft and nontender. No distention. No abdominal bruits. No CVA tenderness. Musculoskeletal: No lower extremity tenderness nor edema.  No joint effusions. Neurologic: Speech does seem somewhat slurry. There is slight suggestion of a left-sided facial immobility. Left arm also feels somewhat weak. Although there is again no drift. Left leg appears normal in strength is slight slowing of the rapid alternating movements in the left arm as well. Skin:  Skin is warm, dry and intact. No rash noted. Psychiatric: Mood and affect are normal.   ____________________________________________   LABS (all labs ordered are listed, but only abnormal results are displayed)  Labs Reviewed  CBC - Abnormal; Notable for the following:    Hemoglobin 9.9 (*)    HCT 30.9 (*)    MCV 76.0 (*)    MCH 24.3 (*)    MCHC 31.9 (*)    RDW 21.5 (*)    All other components within normal limits  COMPREHENSIVE METABOLIC PANEL - Abnormal; Notable for the following:    Glucose, Bld 120 (*)    BUN 32 (*)    Creatinine, Ser 1.54 (*)    GFR calc non Af Amer 37 (*)    GFR calc Af Amer 42 (*)    All other components within normal limits  PROTIME-INR  APTT  DIFFERENTIAL  TROPONIN I   ____________________________________________  EKG  EKG read and interpreted by me shows normal sinus rhythm at a rate of 72 normal axis appear to be any new changes. ____________________________________________  RADIOLOGY  CT of the head is read only as sinusitis. ____________________________________________   PROCEDURES    ____________________________________________   INITIAL IMPRESSION / ASSESSMENT AND PLAN / ED COURSE  Pertinent labs & imaging results that were available during my care of the  patient were reviewed by me and considered in my medical decision making (see chart for details).   ____________________________________________   FINAL CLINICAL IMPRESSION(S) / ED DIAGNOSES  Final diagnoses:  Cerebral infarction due to unspecified mechanism  Other acute sinusitis      Nena Polio, MD 08/31/15 [phone]

## 2015-09-01 ENCOUNTER — Observation Stay: Payer: Medicare Other

## 2015-09-01 LAB — LIPID PANEL
Cholesterol: 148 mg/dL (ref 0–200)
HDL: 80 mg/dL (ref >40)
LDL CALC: 59 mg/dL (ref 0–99)
Total CHOL/HDL Ratio: 1.9 RATIO
Triglycerides: 47 mg/dL (ref <150)
VLDL: 9 mg/dL (ref 0–40)

## 2015-09-01 LAB — COMPREHENSIVE METABOLIC PANEL
ALT: 29 U/L (ref 14–54)
ANION GAP: 5 (ref 5–15)
AST: 29 U/L (ref 15–41)
Albumin: 3.1 g/dL — ABNORMAL LOW (ref 3.5–5.0)
Alkaline Phosphatase: 101 U/L (ref 38–126)
BUN: 34 mg/dL — ABNORMAL HIGH (ref 6–20)
CHLORIDE: 115 mmol/L — AB (ref 101–111)
CO2: 24 mmol/L (ref 22–32)
Calcium: 8.4 mg/dL — ABNORMAL LOW (ref 8.9–10.3)
Creatinine, Ser: 1.56 mg/dL — ABNORMAL HIGH (ref 0.44–1.00)
GFR calc non Af Amer: 36 mL/min — ABNORMAL LOW (ref >60)
GFR, EST AFRICAN AMERICAN: 42 mL/min — AB (ref >60)
Glucose, Bld: 125 mg/dL — ABNORMAL HIGH (ref 65–99)
Potassium: 4.2 mmol/L (ref 3.5–5.1)
SODIUM: 144 mmol/L (ref 135–145)
Total Bilirubin: <0.1 mg/dL — ABNORMAL LOW (ref 0.3–1.2)
Total Protein: 6.5 g/dL (ref 6.5–8.1)

## 2015-09-01 LAB — VITAMIN B12: Vitamin B-12: 2272 pg/mL — ABNORMAL HIGH (ref 180–914)

## 2015-09-01 LAB — CBC
HCT: 28.8 % — ABNORMAL LOW (ref 35.0–47.0)
HEMOGLOBIN: 8.9 g/dL — AB (ref 12.0–16.0)
MCH: 23.5 pg — AB (ref 26.0–34.0)
MCHC: 30.8 g/dL — ABNORMAL LOW (ref 32.0–36.0)
MCV: 76.4 fL — AB (ref 80.0–100.0)
Platelets: 363 10*3/uL (ref 150–440)
RBC: 3.77 MIL/uL — AB (ref 3.80–5.20)
RDW: 21.3 % — ABNORMAL HIGH (ref 11.5–14.5)
WBC: 7.5 10*3/uL (ref 3.6–11.0)

## 2015-09-01 LAB — TSH
TSH: 1.46 u[IU]/mL (ref 0.350–4.500)
TSH: 1.544 u[IU]/mL (ref 0.350–4.500)

## 2015-09-01 LAB — GLUCOSE, CAPILLARY
GLUCOSE-CAPILLARY: 127 mg/dL — AB (ref 65–99)
Glucose-Capillary: 121 mg/dL — ABNORMAL HIGH (ref 65–99)
Glucose-Capillary: 114 mg/dL — ABNORMAL HIGH (ref 65–99)
Glucose-Capillary: 162 mg/dL — ABNORMAL HIGH (ref 65–99)

## 2015-09-01 LAB — FOLATE

## 2015-09-01 LAB — HEMOGLOBIN A1C: Hgb A1c MFr Bld: 6.7 % — ABNORMAL HIGH (ref 4.0–6.0)

## 2015-09-01 MED ORDER — ATORVASTATIN CALCIUM 20 MG PO TABS
40.0000 mg | ORAL_TABLET | Freq: Every day | ORAL | Status: DC
  Administered 2015-09-01: 40 mg via ORAL
  Filled 2015-09-01: qty 2

## 2015-09-01 MED ORDER — BUTALBITAL-APAP-CAFFEINE 50-325-40 MG PO TABS
1.0000 | ORAL_TABLET | ORAL | Status: DC | PRN
  Administered 2015-09-01 – 2015-09-02 (×3): 1 via ORAL
  Filled 2015-09-01 (×3): qty 1

## 2015-09-01 MED ORDER — ATORVASTATIN CALCIUM 40 MG PO TABS: 40.0000 mg | ORAL_TABLET | Freq: Every day | ORAL | Status: DC

## 2015-09-01 MED ORDER — CLOPIDOGREL BISULFATE 75 MG PO TABS: 75.0000 mg | ORAL_TABLET | Freq: Every day | ORAL | Status: DC

## 2015-09-01 MED ORDER — ORPHENADRINE CITRATE 30 MG/ML IJ SOLN
60.0000 mg | Freq: Two times a day (BID) | INTRAMUSCULAR | Status: DC
  Administered 2015-09-01 – 2015-09-02 (×3): 60 mg via INTRAMUSCULAR
  Filled 2015-09-01 (×4): qty 2

## 2015-09-01 NOTE — Evaluation (Signed)
Physical Therapy Evaluation Patient Details Name: Lindsey [name] MRN: [id number] DOB: 1961-10-15 Today's Date: 09/01/2015   History of Present Illness  Pt is a 54 y.o. female presenting to hospital with L sided weakness with dysarthria and HA.  CT of head negative and unable to get MRI d/t stimulator in back from chronic back pain issues.  PMH includes CVA (without residual deficits), chronic COPD, htn, diabetes.  Clinical Impression  Currently pt demonstrates impairments with strength, pain, and limitations with functional mobility.  Prior to admission, pt was modified independent with functional mobility (using rollator within the home and [facility] in community).  Pt lives with her 2 grandchildren in 1 level home with 2 steps to enter (with R railing); pt reports talking with her church to put in ramp.  Pt also reports her grandson is leaving for the [place] next week and he was helping her with bathing, cleaning, laundry, cooking, etc (pt had aide a couple months ago but does not have one anymore).  Currently pt is SBA to CGA with transfers and ambulation 100 feet with RW.  Pt demonstrating L UE and L LE weakness with testing but no noticeable strength deficits noted with functional mobility.  Pt would benefit from skilled PT to address above noted impairments and functional limitations.  Recommend pt discharge to home with HHPT when medically appropriate.     Follow Up Recommendations Home health PT    Equipment Recommendations   (pt requesting wide BSC)    Recommendations for Other Services       Precautions / Restrictions Precautions Precautions: Fall Restrictions Weight Bearing Restrictions: No      Mobility  Bed Mobility Overal bed mobility: Modified Independent             General bed mobility comments: HOB elevated  Transfers Overall transfer level: Modified independent Equipment used: Rolling walker (2 wheeled)             General transfer comment: Sit to/from  stand from bed and toilet with RW; toilet transfer with RW  Ambulation/Gait Ambulation/Gait assistance: Supervision;Min guard Ambulation Distance (Feet): 100 Feet Assistive device: Rolling walker (2 wheeled)   Gait velocity: decreased   General Gait Details: mild decreased B step length/foot clearance/heelstrike; no loss of balance  Stairs            Wheelchair Mobility    Modified Rankin (Stroke Patients Only)       Balance Overall balance assessment: Needs assistance Sitting-balance support: No upper extremity supported;Feet supported Sitting balance-Leahy Scale: Good     Standing balance support: During functional activity (standing while performing hygiene post toileting) Standing balance-Leahy Scale: Good                               Pertinent Vitals/Pain Pain Assessment: 0-10 Pain Score: 8  Pain Location: HA Pain Descriptors / Indicators: Aching;Headache Pain Intervention(s): Limited activity within patient's tolerance;Monitored during session (pt reporting nursing aware and pt was to call for pain meds at a certain time)  Vitals (HR & O2) stable and WFL throughout treatment session.     Home Living Family/patient expects to be discharged to:: Private residence Living Arrangements: Other (Comment) (2 grandchildren but one is leaving for [place] next week) Available Help at Discharge: Family Type of Home: House Home Access: Stairs to enter Entrance Stairs-Rails: Right Entrance Stairs-Number of Steps: 2 Home Layout: One [place]: [place] - single point;Walker - 4 wheels  Additional Comments: Pt reports needing a side BSC for home.  Pt also reports contacting a church who is working on putting in ramp for home.    Prior Function Level of Independence: Needs assistance;Independent with assistive device(s)   Gait / Transfers Assistance Needed: Ambulated with rollator in home and [facility] in community  ADL's / Homemaking Assistance Needed:  Pt had an aide about 2-3 months ago but no longer has aide; grandson (who is leaving next week for the mar[place]) has been providing assist for meals, cleaning, bathing, laundry, etc.  Comments: Does not drive.  Raising 54 y.o. and 66 y.o. grandchildren.     Hand Dominance   Dominant Hand: Right    Extremity/Trunk Assessment   Upper Extremity Assessment: Defer to OT evaluation       LUE Deficits / Details: LUE able to flexion shoulder to 45 degrees, reports numbness in the left hand, opposition of thumb to index and middle finger only, partial fisting.   Unable to assess elbow due to IV.  RUE WFLs, she is right hand dominant.  Stereognosis 3/3 objects correctly.     Lower Extremity Assessment: RLE deficits/detail;LLE deficits/detail RLE Deficits / Details: R LE WFL ROM, strength, coordination, and proprioception LLE Deficits / Details: L hip flexion 3+/5; L knee flexion/extension at least 3+/5; DF at least 3+/5; mild decreased coordination L LE; proprioception intact; decreased sensation 2,3 & 4th toes; decreased sensation overall L>R LE     Communication   Communication: No difficulties  Cognition Arousal/Alertness: Awake/alert Behavior During Therapy: WFL for tasks assessed/performed Overall Cognitive Status: Within Functional Limits for tasks assessed                      General Comments   Nursing cleared pt for participation in physical therapy.  Pt agreeable to PT session.    Exercises        Assessment/Plan    PT Assessment Patient needs continued PT services  PT Diagnosis Difficulty walking   PT Problem List Decreased strength;Decreased activity tolerance;Decreased balance;Decreased mobility;Pain  PT Treatment Interventions DME instruction;Gait training;Stair training;Functional mobility training;Therapeutic activities;Therapeutic exercise;Balance training;Patient/family education   PT Goals (Current goals can be found in the Care Plan section) Acute Rehab PT  Goals Patient Stated Goal: To go home PT Goal Formulation: With patient Time For Goal Achievement: 09/15/15 Potential to Achieve Goals: Good    Frequency 7X/week   Barriers to discharge        Co-evaluation               End of Session Equipment Utilized During Treatment: Gait belt Activity Tolerance: No increased pain Patient left: in bed;with call bell/phone within reach;with bed alarm set      Functional Assessment Tool Used: AM-PAC without stairs Functional Limitation: Mobility: Walking and moving around Mobility: Walking and Moving Around Current Status [id number]): At least 40 percent but less than 60 percent impaired, limited or restricted Mobility: Walking and Moving Around Goal Status [phone]): At least 1 percent but less than 20 percent impaired, limited or restricted    Time: 1440-1500 PT Time Calculation (min) (ACUTE ONLY): 20 min   Charges:   PT Evaluation $Initial PT Evaluation Tier I: 1 Procedure     PT G Codes:   PT G-Codes **NOT FOR INPATIENT CLASS** Functional Assessment Tool Used: AM-PAC without stairs Functional Limitation: Mobility: Walking and moving around Mobility: Walking and Moving Around Current Status [id number]): At least 40 percent but less than 60 percent impaired,  limited or restricted Mobility: Walking and Moving Around Goal Status [phone]): At least 1 percent but less than 20 percent impaired, limited or restricted    Leitha Bleak 09/01/2015, 3:36 PM Leitha Bleak, [place]

## 2015-09-01 NOTE — Plan of Care (Signed)
Problem: Discharge/Transitional Outcomes Goal: Other Discharge Outcomes/Goals Outcome: Progressing Plan of Care Progress to Goal:   Pt is alert and report pain multiple times during shift. Pt report that she takes oxycodone schedule q6hr. Pt has leg cramps during shift and report that taking salt helps. No other signs of distress noted. Will continue to monitor.

## 2015-09-01 NOTE — Progress Notes (Signed)
[facility] [place] NAME: Lindsey [name]    MR#:  [id number]  DATE OF BIRTH:  [birth year]  SUBJECTIVE:  Patient continues to have left-sided weakness and also states that her speech is slow. She was unable to obtain MRI due to the fact that she has a stimulator for her chronic back pain from her previous back surgery.  REVIEW OF SYSTEMS:    Review of Systems  Constitutional: Negative for fever, chills and malaise/fatigue.  HENT: Negative for sore throat.   Eyes: Negative for blurred vision.  Respiratory: Negative for cough, hemoptysis, shortness of breath and wheezing.   Cardiovascular: Negative for chest pain, palpitations and leg swelling.  Gastrointestinal: Negative for nausea, vomiting, abdominal pain, diarrhea and blood in stool.  Genitourinary: Negative for dysuria.  Musculoskeletal: Negative for back pain.  Neurological: Positive for sensory change, speech change, focal weakness and headaches. Negative for dizziness and tremors.  Endo/Heme/Allergies: Does not bruise/bleed easily.    Tolerating Diet: Yes      DRUG ALLERGIES:   Allergies  Allergen Reactions  . Morphine Hives  . Iodinated Diagnostic Agents Hives  . Etodolac Hives  . Ibuprofen Hives  . Shellfish Allergy Hives    VITALS:  Blood pressure 115/73, pulse 98, temperature 98.6 F (37 C), temperature source Oral, resp. rate 18, height 5\' 7"  (1.702 m), weight 134.52 kg (296 lb 9 oz), SpO2 100 %.  PHYSICAL EXAMINATION:   Physical Exam    LABORATORY PANEL:   CBC  Recent Labs Lab 09/01/15 0422  WBC 7.5  HGB 8.9*  HCT 28.8*  PLT 363   ------------------------------------------------------------------------------------------------------------------  Chemistries   Recent Labs Lab 09/01/15 0422  NA 144  K 4.2  CL 115*  CO2 24  GLUCOSE 125*  BUN 34*  CREATININE 1.56*  CALCIUM 8.4*  AST 29  ALT 29  ALKPHOS 101  BILITOT <0.1*    ------------------------------------------------------------------------------------------------------------------  Cardiac Enzymes  Recent Labs Lab 08/31/15 1435  TROPONINI <0.03   ------------------------------------------------------------------------------------------------------------------  RADIOLOGY:  Ct Head Wo Contrast  08/31/2015   CLINICAL DATA:  Headache with left-sided numbness and weakness and slurred speech this morning.  EXAM: CT HEAD WITHOUT CONTRAST  TECHNIQUE: Contiguous axial images were obtained from the base of the skull through the vertex without intravenous contrast.  COMPARISON:  CT scan dated 06/29/2014  FINDINGS: No mass lesion. No midline shift. No acute hemorrhage or hematoma. No extra-axial fluid collections. No evidence of acute infarction. Brain parenchyma is normal. Slight mucosal thickening in the ethmoid and right maxillary sinuses. No air-fluid levels.  IMPRESSION: 1. The brain appears normal. 2. Slight mucosal thickening in the paranasal sinuses, most likely not acute.   Electronically Signed   By: Lorriane Shire M.D.   On: 08/31/2015 14:52     ASSESSMENT AND PLAN:   54 year old female with a history of CAD, CVA with no residual deficits and COPD who presented with  left-sided weakness and dysarthria.  1. Left-sided weakness: Patient is being evaluated for CVA. Patient was unable to obtain MRI. She has a spinal cord stimulator I ordered another CT scan. Patient has carotid Dopplers and 2-D echocardiogram which are pending. Patient also has a neurology consultation. She is allergic to aspirin and therefore Plavix was started. Continue statin. Physical therapy consultation has been placed. I added occupational therapy and speech consultation as well.   2. Chronic kidney disease stage III: Creatinine remained stable  3. Essential hypertension: Patient is currently on clonidine, metoprolol,  Avapro and HCTZ. 4. Hyperlipidemia: LDL is 59 which is at goal  Continue simvastatin. 5. COPD: Does not appear to be an exacerbation .   Management plans discussed with the patient and she is in agreement.  CODE STATUS: FULL  TOTAL TIME TAKING CARE OF THIS PATIENT: 35 minutes.     POSSIBLE D/C 1-2 days, DEPENDING ON CLINICAL CONDITION.   Jermaine Tholl M.D on 09/01/2015 at 10:51 AM  Between 7am to 6pm - Pager - [phone] After 6pm go to [email or web address] - password [email or web address] [facility]  Office  [phone]  CC: Primary care physician; Volanda Napoleon, MD  Note: This dictation was prepared with Dragon dictation along with smaller phrase technology. Any transcriptional errors that result from this process are unintentional.

## 2015-09-01 NOTE — Consult Note (Signed)
Reason for Consult: stroke Referring Physician: Dr. Lestine Box is an 54 y.o. female.  HPI: seen at request of Dr. Benjie Karvonen for stroke;  54 yo RHD F presents to [facility] secondary to headache and some L sided weakness for the past 3 days.  Pt denies previous history of headaches.  Pt states that head hurts mainly in the back and to the front of head.  There is no photophobia or phonophobia or N/V.  NSAIDs make headache better only but nothing else.  Headache is 6/10 now.  She denies loss of consciousness.  She states that she has some numbness on the L side as well as weakness.  She does report occasional floaters too.  Past Medical History  Diagnosis Date  . Diabetes mellitus without complication ([place])   . Stroke ([place])   . Hypertension   . Allergy   . COPD (chronic obstructive pulmonary disease) ([place])   . Sleep apnea   . Hyperlipidemia   . Heart attack ([place])   . GERD (gastroesophageal reflux disease)     Past Surgical History  Procedure Laterality Date  . Gastric bypass    . Replacement total knee    . Rotator cuff repair    . Gallbladder surgery    . Hernia repair    . Back surgery    . Broken wrist    . Abdominal hysterectomy      No family history on file.  Social History:  reports that she has never smoked. She has never used smokeless tobacco. She reports that she does not drink alcohol or use illicit drugs.  Allergies:  Allergies  Allergen Reactions  . Morphine Hives  . Iodinated Diagnostic Agents Hives  . Etodolac Hives  . Ibuprofen Hives  . Shellfish Allergy Hives    Medications: personally reviewed by me as per chart  Results for orders placed or performed during the hospital encounter of 08/31/15 (from the past 48 hour(s))  Protime-INR     Status: None   Collection Time: 08/31/15  2:35 PM  Result Value Ref Range   Prothrombin Time 12.6 11.4 - 15.0 seconds   INR 0.92   APTT     Status: None   Collection Time: 08/31/15  2:35 PM  Result Value Ref  Range   aPTT 30 24 - 36 seconds  CBC     Status: Abnormal   Collection Time: 08/31/15  2:35 PM  Result Value Ref Range   WBC 8.4 3.6 - 11.0 K/uL   RBC 4.07 3.80 - 5.20 MIL/uL   Hemoglobin 9.9 (L) 12.0 - 16.0 g/dL   HCT 30.9 (L) 35.0 - 47.0 %   MCV 76.0 (L) 80.0 - 100.0 fL   MCH 24.3 (L) 26.0 - 34.0 pg   MCHC 31.9 (L) 32.0 - 36.0 g/dL   RDW 21.5 (H) 11.5 - 14.5 %   Platelets 424 150 - 440 K/uL  Differential     Status: None   Collection Time: 08/31/15  2:35 PM  Result Value Ref Range   Neutrophils Relative % 51 %   Neutro Abs 4.3 1.4 - 6.5 K/uL   Lymphocytes Relative 36 %   Lymphs Abs 3.0 1.0 - 3.6 K/uL   Monocytes Relative 8 %   Monocytes Absolute 0.6 0.2 - 0.9 K/uL   Eosinophils Relative 4 %   Eosinophils Absolute 0.3 0 - 0.7 K/uL   Basophils Relative 1 %   Basophils Absolute 0.1 0 - 0.1 K/uL  Comprehensive metabolic  panel     Status: Abnormal   Collection Time: 08/31/15  2:35 PM  Result Value Ref Range   Sodium 141 135 - 145 mmol/L   Potassium 4.2 3.5 - 5.1 mmol/L   Chloride 109 101 - 111 mmol/L   CO2 24 22 - 32 mmol/L   Glucose, Bld 120 (H) 65 - 99 mg/dL   BUN 32 (H) 6 - 20 mg/dL   Creatinine, Ser 6.46 (H) 0.44 - 1.00 mg/dL   Calcium 9.2 8.9 - 80.3 mg/dL   Total Protein 7.4 6.5 - 8.1 g/dL   Albumin 3.7 3.5 - 5.0 g/dL   AST 37 15 - 41 U/L   ALT 34 14 - 54 U/L   Alkaline Phosphatase 113 38 - 126 U/L   Total Bilirubin 0.4 0.3 - 1.2 mg/dL   GFR calc non Af Amer 37 (L) >60 mL/min   GFR calc Af Amer 42 (L) >60 mL/min    Comment: (NOTE) The eGFR has been calculated using the CKD EPI equation. This calculation has not been validated in all clinical situations. eGFR's persistently <60 mL/min signify possible Chronic Kidney Disease.    Anion gap 8 5 - 15  Troponin I     Status: None   Collection Time: 08/31/15  2:35 PM  Result Value Ref Range   Troponin I <0.03 <0.031 ng/mL    Comment:        NO INDICATION OF MYOCARDIAL INJURY.   Glucose, capillary     Status:  Abnormal   Collection Time: 08/31/15 10:04 PM  Result Value Ref Range   Glucose-Capillary 134 (H) 65 - 99 mg/dL   Comment 1 Notify RN   Comprehensive metabolic panel     Status: Abnormal   Collection Time: 09/01/15  4:22 AM  Result Value Ref Range   Sodium 144 135 - 145 mmol/L   Potassium 4.2 3.5 - 5.1 mmol/L   Chloride 115 (H) 101 - 111 mmol/L   CO2 24 22 - 32 mmol/L   Glucose, Bld 125 (H) 65 - 99 mg/dL   BUN 34 (H) 6 - 20 mg/dL   Creatinine, Ser 2.12 (H) 0.44 - 1.00 mg/dL   Calcium 8.4 (L) 8.9 - 10.3 mg/dL   Total Protein 6.5 6.5 - 8.1 g/dL   Albumin 3.1 (L) 3.5 - 5.0 g/dL   AST 29 15 - 41 U/L   ALT 29 14 - 54 U/L   Alkaline Phosphatase 101 38 - 126 U/L   Total Bilirubin <0.1 (L) 0.3 - 1.2 mg/dL   GFR calc non Af Amer 36 (L) >60 mL/min   GFR calc Af Amer 42 (L) >60 mL/min    Comment: (NOTE) The eGFR has been calculated using the CKD EPI equation. This calculation has not been validated in all clinical situations. eGFR's persistently <60 mL/min signify possible Chronic Kidney Disease.    Anion gap 5 5 - 15  CBC     Status: Abnormal   Collection Time: 09/01/15  4:22 AM  Result Value Ref Range   WBC 7.5 3.6 - 11.0 K/uL   RBC 3.77 (L) 3.80 - 5.20 MIL/uL   Hemoglobin 8.9 (L) 12.0 - 16.0 g/dL   HCT 24.8 (L) 25.0 - 03.7 %   MCV 76.4 (L) 80.0 - 100.0 fL   MCH 23.5 (L) 26.0 - 34.0 pg   MCHC 30.8 (L) 32.0 - 36.0 g/dL   RDW 04.8 (H) 88.9 - 16.9 %   Platelets 363 150 - 440 K/uL  TSH     Status: None   Collection Time: 09/01/15  4:22 AM  Result Value Ref Range   TSH 1.460 0.350 - 4.500 uIU/mL  Glucose, capillary     Status: Abnormal   Collection Time: 09/01/15  7:33 AM  Result Value Ref Range   Glucose-Capillary 121 (H) 65 - 99 mg/dL  Lipid panel     Status: None   Collection Time: 09/01/15  9:32 AM  Result Value Ref Range   Cholesterol 148 0 - 200 mg/dL   Triglycerides 47 <150 mg/dL   HDL 80 >40 mg/dL   Total CHOL/HDL Ratio 1.9 RATIO   VLDL 9 0 - 40 mg/dL   LDL  Cholesterol 59 0 - 99 mg/dL    Comment:        Total Cholesterol/HDL:CHD Risk Coronary Heart Disease Risk Table                     Men   Women  1/2 Average Risk   3.4   3.3  Average Risk       5.0   4.4  2 X Average Risk   9.6   7.1  3 X Average Risk  23.4   11.0        Use the calculated Patient Ratio above and the CHD Risk Table to determine the patient's CHD Risk.        ATP III CLASSIFICATION (LDL):  <100     mg/dL   Optimal  100-129  mg/dL   Near or Above                    Optimal  130-159  mg/dL   Borderline  160-189  mg/dL   High  >190     mg/dL   Very High   Glucose, capillary     Status: Abnormal   Collection Time: 09/01/15 11:27 AM  Result Value Ref Range   Glucose-Capillary 127 (H) 65 - 99 mg/dL    Ct Head Wo Contrast  09/01/2015   CLINICAL DATA:  Continued left-sided weakness and slow speech.  EXAM: CT HEAD WITHOUT CONTRAST  TECHNIQUE: Contiguous axial images were obtained from the base of the skull through the vertex without intravenous contrast.  COMPARISON:  08/31/2015  FINDINGS: No acute intracranial abnormality. Specifically, no hemorrhage, hydrocephalus, mass lesion, acute infarction, or significant intracranial injury. No acute calvarial abnormality.  Mucosal thickening within the paranasal sinuses. No air-fluid levels. Mastoid air cells are clear.  IMPRESSION: No intracranial abnormality.  Chronic sinusitis.   Electronically Signed   By: Rolm Baptise M.D.   On: 09/01/2015 11:16   Ct Head Wo Contrast  08/31/2015   CLINICAL DATA:  Headache with left-sided numbness and weakness and slurred speech this morning.  EXAM: CT HEAD WITHOUT CONTRAST  TECHNIQUE: Contiguous axial images were obtained from the base of the skull through the vertex without intravenous contrast.  COMPARISON:  CT scan dated 06/29/2014  FINDINGS: No mass lesion. No midline shift. No acute hemorrhage or hematoma. No extra-axial fluid collections. No evidence of acute infarction. Brain parenchyma is  normal. Slight mucosal thickening in the ethmoid and right maxillary sinuses. No air-fluid levels.  IMPRESSION: 1. The brain appears normal. 2. Slight mucosal thickening in the paranasal sinuses, most likely not acute.   Electronically Signed   By: Lorriane Shire M.D.   On: 08/31/2015 14:52   US Carotid Bilateral  09/01/2015   CLINICAL DATA:  TIA.  EXAM: BILATERAL  CAROTID DUPLEX ULTRASOUND  TECHNIQUE: Pearline Cables scale imaging, color Doppler and duplex ultrasound were performed of bilateral carotid and vertebral arteries in the neck.  COMPARISON:  10/24/2011.  FINDINGS: Criteria: Quantification of carotid stenosis is based on velocity parameters that correlate the residual internal carotid diameter with NASCET-based stenosis levels, using the diameter of the distal internal carotid lumen as the denominator for stenosis measurement.  The following velocity measurements were obtained:  RIGHT  ICA:  143/48 cm/sec  CCA:  921/19 cm/sec  SYSTOLIC ICA/CCA RATIO:  1.2  DIASTOLIC ICA/CCA RATIO:  2.1  ECA:  139 cm/sec  LEFT  ICA:  138/43 cm/sec  CCA:  417/40 cm/sec  SYSTOLIC ICA/CCA RATIO:  1.1  DIASTOLIC ICA/CCA RATIO:  1.3  ECA:  133 cm/sec  RIGHT CAROTID ARTERY: Mild atherosclerotic vascular plaque right carotid bifurcation. No flow limiting stenosis. Waveforms unremarkable.  RIGHT VERTEBRAL ARTERY:  Patent with antegrade flow.  LEFT CAROTID ARTERY: Mild atherosclerotic vascular plaque left carotid bifurcation. No flow limiting stenosis. Waveforms unremarkable.  LEFT VERTEBRAL ARTERY:  Patent with antegrade flow.  IMPRESSION: 1. Mild atherosclerotic vascular plaque both carotid bifurcations. No flow limiting stenosis. Degree of stenosis less than 50%. No significant change from prior exam. 2. Vertebral arteries are patent with antegrade flow.   Electronically Signed   By: Marcello Moores  Register   On: 09/01/2015 11:35    Review of Systems  Constitutional: Positive for malaise/fatigue. Negative for fever, chills, weight loss and  diaphoresis.  HENT: Negative for congestion, ear discharge, ear pain, hearing loss, nosebleeds, sore throat and tinnitus.   Eyes: Negative.   Respiratory: Negative.  Negative for stridor.   Cardiovascular: Negative.   Gastrointestinal: Negative.   Genitourinary: Negative.   Musculoskeletal: Positive for myalgias and neck pain. Negative for back pain, joint pain and falls.  Skin: Negative.   Neurological: Positive for sensory change, focal weakness, weakness and headaches. Negative for dizziness, tingling, tremors, speech change, seizures and loss of consciousness.  Psychiatric/Behavioral: Positive for depression.   Blood pressure 115/73, pulse 98, temperature 98.6 F (37 C), temperature source Oral, resp. rate 18, height $RemoveBe'5\' 7"'gWtphJMTw$  (1.702 m), weight 134.52 kg (296 lb 9 oz), SpO2 100 %. Physical Exam  Nursing note and vitals reviewed. Constitutional: She appears well-developed and well-nourished. No distress.  HENT:  Head: Normocephalic and atraumatic.  Right Ear: External ear normal.  Left Ear: External ear normal.  Nose: Nose normal.  Mouth/Throat: Oropharynx is clear and moist.  Eyes: Conjunctivae and EOM are normal. Pupils are equal, round, and reactive to light.  Neck: Normal range of motion. Neck supple.  Cardiovascular: Normal rate, regular rhythm, normal heart sounds and intact distal pulses.   Respiratory: Effort normal and breath sounds normal.  GI: Soft. Bowel sounds are normal.  Musculoskeletal: Normal range of motion.  Neurological:  A+Ox3, nl speech and language PERRLA, EOMI, nl VF, face symmetric, tongue midline 5/5 R, 3/5 L with give away weakness;  + hoover on L FTN WNL on R but cant do on L 0+/4 B, mute plantars Splits midline to pinprick on face and sternum;  Vibration changes over forehead changes as well as sternum  Skin: Skin is warm. She is not diaphoretic.  Psychiatric: Her mood appears anxious. She exhibits a depressed mood.   CT of head personally reviewed  by me and normal  Assessment/Plan: 1.  L sided weakness-  Her pattern of weakness and sensory loss does not follow typical neurologic pathways and mechanisms.  Concern for conversion d/o by exam  however will do full workup considering that she does not typical complain of these things per her. 2.  Headache-  Sounds more like a tension headache but could be referred neck pain or even post-epileptic -  CT of neck w/o contrast -  Pt unable to have contrast or MRI which limits our diagnositic capabilities -  EEG -  Check B12/folate, TSH -  Would benefit from low dose NSAIDs such as Naproxen $RemoveBef'250mg'[id number]$  BID -  Start Norflex $RemoveBefor'60mg'[id number]$  IV q12h -  Await PT assessment -  Will follow  Manasseh Pittsley 09/01/2015, 2:04 PM

## 2015-09-01 NOTE — Evaluation (Signed)
Clinical Impression   Patient is a 54 yo female admitted to [facility] after experiencing left sided weakness, numbness and slurred speech.  She lives at home with 2 grandchildren, ages 19 and 88 in a one story home with 2 steps to enter.  She has a walker and cane and does not drive.  She is currently on disability.  She presents with LUE muscle weakness, decreased coordination skills and decreased ability to perform lower body bathing and dressing.  She reports having a low toilet at home and does not currently have a bedside commode.  Recommend wide BSC for patient.  She would benefit from continued OT to maximize her safety and independence in self care tasks to return home.  She will likely require home health services depending on progress.      Follow Up Recommendations  Home health OT    Equipment Recommendations  3 in 1 bedside comode    Recommendations for Other Services       Precautions / Restrictions Precautions Precautions: Fall Restrictions Weight Bearing Restrictions: No      Mobility Bed Mobility Overal bed mobility: Modified Independent                Transfers Overall transfer level: Modified independent Equipment used: None             General transfer comment: did not use equipment for toilet transfer.    Balance                                            ADL Overall ADL's : Needs assistance/impaired Eating/Feeding: Independent   Grooming: Modified independent   Upper Body Bathing: Minimal assitance   Lower Body Bathing: Moderate assistance       Lower Body Dressing: Moderate assistance Lower Body Dressing Details (indicate cue type and reason): uses reacher at home for doffing socks Toilet Transfer: Modified Independent                   Vision     Perception     Praxis      Pertinent Vitals/Pain Pain Assessment: 0-10 Pain Score: 8  Pain Descriptors / Indicators: Aching;Headache     Hand Dominance  Right   Extremity/Trunk Assessment Upper Extremity Assessment Upper Extremity Assessment: Generalized weakness;LUE deficits/detail LUE Deficits / Details: LUE able to flexion shoulder to 45 degrees, reports numbness in the left hand, opposition of thumb to index and middle finger only, partial fisting.   Unable to assess elbow due to IV.  RUE WFLs, she is right hand dominant.  Stereognosis 3/3 objects correctly.   LUE Coordination: decreased fine motor;decreased gross motor   Lower Extremity Assessment Lower Extremity Assessment: Overall WFL for tasks assessed;Defer to PT evaluation       Communication Communication Communication: No difficulties   Cognition Arousal/Alertness: Awake/alert Behavior During Therapy: WFL for tasks assessed/performed Overall Cognitive Status: Within Functional Limits for tasks assessed                     General Comments       Exercises       Shoulder Instructions      Home Living Family/patient expects to be discharged to:: Private residence Living Arrangements: Other (Comment) Available Help at Discharge: Family Type of Home: House Home Access: Stairs to enter [employer] of Steps: 2  Home Layout: One level     Bathroom Shower/Tub: Tub/shower unit Shower/tub characteristics: Architectural technologist: Standard Bathroom Accessibility: Yes   Home Equipment: Environmental consultant - 2 wheels;Cane - single point   Additional Comments: Patient reports she needs a wide BSC for home.      Prior Functioning/Environment Level of Independence: Independent;Needs assistance  Gait / Transfers Assistance Needed: Ambulated with walker previously at home ADL's / Homemaking Assistance Needed: Patient had an aide to come daily for 2-3 hours which would help with her bath and to perform house hold chores and cleaning.   Comments: She does not drive.  Has 2 grandchildren she is raising, age 13 and 21.      OT Diagnosis: Generalized  weakness;Hemiplegia non-dominant side;Other (comment) (decreased coordination of LUE)   OT Problem List: Decreased strength;Pain;Decreased range of motion;Decreased coordination;Decreased knowledge of use of DME or AE;Impaired UE functional use   OT Treatment/Interventions: Self-care/ADL training;Therapeutic exercise;Patient/family education;Neuromuscular education;DME and/or AE instruction    OT Goals(Current goals can be found in the care plan section) Acute Rehab OT Goals Patient Stated Goal: Patient reports she wants to return home and return to her previous level of independence. Time For Goal Achievement: 09/08/15 Potential to Achieve Goals: Good  OT Frequency: Min 1X/week   Barriers to D/C:            Co-evaluation              End of Session Equipment Utilized During Treatment: Gait belt  Activity Tolerance: Patient tolerated treatment well Patient left: in bed;with call bell/phone within reach   Time: 1410-1440 OT Time Calculation (min): 30 min Charges:  OT General Charges $OT Visit: 1 Procedure OT Evaluation $Initial OT Evaluation Tier I: 1 Procedure OT Treatments $Self Care/Home Management : 8-22 mins G-Codes:    Lindsey [name] 29-Sep-2015, 2:52 PM

## 2015-09-01 NOTE — Progress Notes (Signed)
Speech Therapy Note:  Consulted chart and NSG.  Patient reports no significant deficits in speech, language or swallowing at this time.  ST available if concerns arise.  Encouraged f/u with PCP if reoccurs. No significant deficits observed by ST either. Updated NSG.

## 2015-09-01 NOTE — Discharge Summary (Signed)
[facility] [place] NAME: Lindsey [name]    MR#:  [id number]  DATE OF BIRTH:  1961-08-25  DATE OF ADMISSION:  08/31/2015 ADMITTING PHYSICIAN: Nicholes Mango, MD  DATE OF DISCHARGE: 09/02/2015  PRIMARY CARE PHYSICIAN: Volanda Napoleon, MD    ADMISSION DIAGNOSIS:  Other acute sinusitis [J01.80] TIA (transient ischemic attack) [G45.9] Cerebral infarction due to unspecified mechanism [I63.9]  DISCHARGE DIAGNOSIS:  Active Problems:   Seizures ([place])   SECONDARY DIAGNOSIS:   Past Medical History  Diagnosis Date  . Diabetes mellitus without complication ([place])   . Stroke ([place])   . Hypertension   . Allergy   . COPD (chronic obstructive pulmonary disease) ([place])   . Sleep apnea   . Hyperlipidemia   . Heart attack ([place])   . GERD (gastroesophageal reflux disease)     HOSPITAL COURSE:  54 year old female with a history of CAD, CVA with no residual deficits and COPD who presented with  left-sided weakness and dysarthria.  1. Left-sided weakness: Evaluated for CVA with CT, ECHO and carotid dopplers which were all within normal limits.  She was unable to have MRI due to spinal cord stimulator.  She will continue on plavix. EEG shows nonspecific sharp waves possibly representing epileptic focus.  She has been started on Trileptal and will follow up with neurology with in two weeks. She will also have home health nursing and PT.  2. Chronic kidney disease stage III: Creatinine remained stable  3. Essential hypertension: Patient is currently on clonidine, metoprolol, Avapro and HCTZ.  4. Hyperlipidemia: LDL is 59 which is at goal Continue simvastatin.  5. COPD: Does not appear to be an exacerbation  DISCHARGE CONDITIONS AND DIET:  Heart healthy diet and stable condition  CONSULTS OBTAINED:  Treatment Team:  Nicholes Mango, MD Valora Corporal, MD  DRUG ALLERGIES:   Allergies  Allergen Reactions  . Morphine Hives  . Iodinated  Diagnostic Agents Hives  . Etodolac Hives  . Ibuprofen Hives  . Shellfish Allergy Hives    DISCHARGE MEDICATIONS:   Current Discharge Medication List    START taking these medications   Details  atorvastatin (LIPITOR) 40 MG tablet Take 1 tablet (40 mg total) by mouth daily at 6 PM. Qty: 30 tablet, Refills: 0    butalbital-acetaminophen-caffeine (FIORICET, ESGIC) 50-325-40 MG tablet Take 1 tablet by mouth every 4 (four) hours as needed for headache. Qty: 14 tablet, Refills: 0    clopidogrel (PLAVIX) 75 MG tablet Take 1 tablet (75 mg total) by mouth daily. Qty: 30 tablet, Refills: 0    !! OXcarbazepine (TRILEPTAL) 150 MG tablet Take 1 tablet (150 mg total) by mouth 2 (two) times daily. Take twice a day for 3 days and then start higher dose. Qty: 6 tablet, Refills: 0    !! Oxcarbazepine (TRILEPTAL) 300 MG tablet Take 1 tablet (300 mg total) by mouth 2 (two) times daily. Start this prescription after completing 3 days of 150 mg twice a day. Qty: 60 tablet, Refills: 1     !! - Potential duplicate medications found. Please discuss with provider.    CONTINUE these medications which have NOT CHANGED   Details  albuterol (PROVENTIL HFA;VENTOLIN HFA) 108 (90 BASE) MCG/ACT inhaler Inhale 2 puffs into the lungs every 4 (four) hours as needed for wheezing or shortness of breath.    albuterol (PROVENTIL) (2.5 MG/3ML) 0.083% nebulizer solution Take 2.5 mg by nebulization every 6 (six) hours as needed for wheezing or shortness  of breath.    ALPRAZolam (XANAX) 1 MG tablet Take 1-1.5 mg by mouth 2 (two) times daily. Pt takes one tablet in the morning and one and one-half tablet at night.    baclofen (LIORESAL) 10 MG tablet Take 10 mg by mouth 3 (three) times daily.    benzonatate (TESSALON) 100 MG capsule Take 100 mg by mouth 3 (three) times daily as needed for cough.    Calcium Lactate 648 MG TABS Take 648 mg by mouth 2 (two) times daily.    cloNIDine (CATAPRES) 0.1 MG tablet Take 0.1 mg by  mouth at bedtime.    desvenlafaxine (PRISTIQ) 100 MG 24 hr tablet Take 100 mg by mouth daily.    esomeprazole (NEXIUM) 40 MG capsule Take 40 mg by mouth 2 (two) times daily.    fentaNYL (DURAGESIC - DOSED MCG/HR) 50 MCG/HR Place 50 mcg onto the skin every 3 (three) days.    fexofenadine (ALLEGRA) 60 MG tablet Take 60 mg by mouth 2 (two) times daily.    fluticasone (FLONASE) 50 MCG/ACT nasal spray Place 1 spray into both nostrils 2 (two) times daily.    folic acid (FOLVITE) 1 MG tablet Take 1 mg by mouth daily.    furosemide (LASIX) 20 MG tablet Take 20 mg by mouth daily.     hydrOXYzine (ATARAX/VISTARIL) 25 MG tablet Take 25 mg by mouth 3 (three) times daily.    l-methylfolate-B6-B12 (METANX) 3-35-2 MG TABS tablet Take 1 tablet by mouth 2 (two) times daily.    metoprolol succinate (TOPROL-XL) 100 MG 24 hr tablet Take 100 mg by mouth daily.    mirtazapine (REMERON) 45 MG tablet Take 45 mg by mouth at bedtime.    olmesartan-hydrochlorothiazide (BENICAR HCT) 40-12.5 MG tablet Take 1 tablet by mouth daily.    oxybutynin (DITROPAN) 5 MG tablet Take 5 mg by mouth 2 (two) times daily.    oxyCODONE (OXY IR/ROXICODONE) 5 MG immediate release tablet Take 5 mg by mouth every 6 (six) hours as needed for severe pain.    Phendimetrazine Tartrate 35 MG TABS Take 35 mg by mouth 3 (three) times daily.    potassium chloride (K-DUR) 10 MEQ tablet Take 10 mEq by mouth daily.    zolpidem (AMBIEN) 10 MG tablet Take 10 mg by mouth at bedtime as needed for sleep.      STOP taking these medications     simvastatin (ZOCOR) 40 MG tablet         Today   CHIEF COMPLAINT:  Left-sided weakness   VITAL SIGNS:  Blood pressure 108/53, pulse 71, temperature 98.7 F (37.1 C), temperature source Oral, resp. rate 18, height 5\' 7"  (1.702 m), weight 134.52 kg (296 lb 9 oz), SpO2 100 %.   REVIEW OF SYSTEMS:  Review of Systems  Constitutional: Negative for fever, chills and malaise/fatigue.  HENT:  Negative for sore throat.   Eyes: Negative for blurred vision.  Respiratory: Negative for cough, hemoptysis, shortness of breath and wheezing.   Cardiovascular: Negative for chest pain, palpitations and leg swelling.  Gastrointestinal: Negative for nausea, vomiting, abdominal pain, diarrhea and blood in stool.  Genitourinary: Negative for dysuria.  Musculoskeletal: Negative for back pain.  Neurological: Positive for sensory change, speech change and focal weakness. Negative for dizziness, tremors and headaches.  Endo/Heme/Allergies: Does not bruise/bleed easily.     PHYSICAL EXAMINATION:  GENERAL:  54 y.o.-year-old patient lying in the bed with no acute distress. fatigued NECK:  Supple, no jugular venous distention. No thyroid enlargement, no  tenderness.  LUNGS: Normal breath sounds bilaterally, no wheezing, rales,rhonchi  No use of accessory muscles of respiration.  CARDIOVASCULAR: S1, S2 normal. No murmurs, rubs, or gallops.  ABDOMEN: Soft, non-tender, non-distended. Bowel sounds present. No organomegaly or mass.  EXTREMITIES: No pedal edema, cyanosis, or clubbing.  PSYCHIATRIC: The patient is alert and oriented x 3.  SKIN: No obvious rash, lesion, or ulcer.  Neuro: Decreased sensation left upper extremity. Strength 5/5 throughout  CBC  Recent Labs Lab 09/01/15 0422  WBC 7.5  HGB 8.9*  HCT 28.8*  PLT 363    Chemistries   Recent Labs Lab 09/01/15 0422  NA 144  K 4.2  CL 115*  CO2 24  GLUCOSE 125*  BUN 34*  CREATININE 1.56*  CALCIUM 8.4*  AST 29  ALT 29  ALKPHOS 101  BILITOT <0.1*    Cardiac Enzymes  Recent Labs Lab 08/31/15 1435  TROPONINI <0.03    Microbiology Results  none  RADIOLOGY:  Ct Head Wo Contrast  09/01/2015   CLINICAL DATA:  Continued left-sided weakness and slow speech.  EXAM: CT HEAD WITHOUT CONTRAST  TECHNIQUE: Contiguous axial images were obtained from the base of the skull through the vertex without intravenous contrast.  COMPARISON:   08/31/2015  FINDINGS: No acute intracranial abnormality. Specifically, no hemorrhage, hydrocephalus, mass lesion, acute infarction, or significant intracranial injury. No acute calvarial abnormality.  Mucosal thickening within the paranasal sinuses. No air-fluid levels. Mastoid air cells are clear.  IMPRESSION: No intracranial abnormality.  Chronic sinusitis.   Electronically Signed   By: Rolm Baptise M.D.   On: 09/01/2015 11:16   Ct Head Wo Contrast  08/31/2015   CLINICAL DATA:  Headache with left-sided numbness and weakness and slurred speech this morning.  EXAM: CT HEAD WITHOUT CONTRAST  TECHNIQUE: Contiguous axial images were obtained from the base of the skull through the vertex without intravenous contrast.  COMPARISON:  CT scan dated 06/29/2014  FINDINGS: No mass lesion. No midline shift. No acute hemorrhage or hematoma. No extra-axial fluid collections. No evidence of acute infarction. Brain parenchyma is normal. Slight mucosal thickening in the ethmoid and right maxillary sinuses. No air-fluid levels.  IMPRESSION: 1. The brain appears normal. 2. Slight mucosal thickening in the paranasal sinuses, most likely not acute.   Electronically Signed   By: Lorriane Shire M.D.   On: 08/31/2015 14:52   Ct Soft Tissue Neck Wo Contrast  09/01/2015   CLINICAL DATA:  Mass on right side of neck.  Right neck pain.  EXAM: CT NECK WITHOUT CONTRAST  TECHNIQUE: Multidetector CT imaging of the neck was performed following the standard protocol without intravenous contrast.  COMPARISON:  None.  FINDINGS: Pharynx and larynx: Unremarkable.  Salivary glands: Unremarkable.  Thyroid: Unremarkable.  Lymph nodes: No cervical adenopathy.  Vascular: Scattered calcifications in the carotid bulb regions bilaterally.  Limited intracranial: Unremarkable.  Visualized orbits: Unremarkable.  Mastoids and visualized paranasal sinuses: Mucosal thickening in the maxillary sinuses, ethmoid air cells with probable air-fluid levels in the  maxillary sinuses and right sphenoid sinus. Mastoid air cells are clear.  Skeleton: No acute bony abnormality.  Upper chest: Lung apices clear.  Other: The upper esophagus is dilated and air-filled. In addition, there is a soft tissue filling defect in the upper esophagus measuring 18 mm.  No soft tissue abnormality in the right neck, in particular in the area marked with vitamin E capsule.  IMPRESSION: No soft tissue abnormality in the right neck in the area of concern.  Air-filled  dilated upper esophagus with soft tissue filling defect in the upper is cervical esophagus. Cannot exclude exophytic mass. Recommend further evaluation with direct visualization.   Electronically Signed   By: Rolm Baptise M.D.   On: 09/01/2015 15:36   US Carotid Bilateral  09/01/2015   CLINICAL DATA:  TIA.  EXAM: BILATERAL CAROTID DUPLEX ULTRASOUND  TECHNIQUE: Pearline Cables scale imaging, color Doppler and duplex ultrasound were performed of bilateral carotid and vertebral arteries in the neck.  COMPARISON:  10/24/2011.  FINDINGS: Criteria: Quantification of carotid stenosis is based on velocity parameters that correlate the residual internal carotid diameter with NASCET-based stenosis levels, using the diameter of the distal internal carotid lumen as the denominator for stenosis measurement.  The following velocity measurements were obtained:  RIGHT  ICA:  143/48 cm/sec  CCA:  [id number] cm/sec  SYSTOLIC ICA/CCA RATIO:  1.2  DIASTOLIC ICA/CCA RATIO:  2.1  ECA:  139 cm/sec  LEFT  ICA:  138/43 cm/sec  CCA:  [id number] cm/sec  SYSTOLIC ICA/CCA RATIO:  1.1  DIASTOLIC ICA/CCA RATIO:  1.3  ECA:  133 cm/sec  RIGHT CAROTID ARTERY: Mild atherosclerotic vascular plaque right carotid bifurcation. No flow limiting stenosis. Waveforms unremarkable.  RIGHT VERTEBRAL ARTERY:  Patent with antegrade flow.  LEFT CAROTID ARTERY: Mild atherosclerotic vascular plaque left carotid bifurcation. No flow limiting stenosis. Waveforms unremarkable.  LEFT VERTEBRAL ARTERY:   Patent with antegrade flow.  IMPRESSION: 1. Mild atherosclerotic vascular plaque both carotid bifurcations. No flow limiting stenosis. Degree of stenosis less than 50%. No significant change from prior exam. 2. Vertebral arteries are patent with antegrade flow.   Electronically Signed   By: Marcello Moores  Register   On: 09/01/2015 11:35      Management plans discussed with the patient and she is in agreement. Stable for discharge home with [facility]  Patient should follow up with PCP in 1 week and with [facility] neurology in 2 weeks.  CODE STATUS:     Code Status Orders        Start     Ordered   08/31/15 1749  Full code   Continuous     08/31/15 1748    Advance Directive Documentation        Most Recent Value   Type of Advance Directive  Healthcare Power of Attorney   Pre-existing out of facility DNR order (yellow form or pink MOST form)     "MOST" Form in Place?        TOTAL TIME TAKING CARE OF THIS PATIENT: 35 minutes.    Myrtis Ser M.D on 09/02/2015 at 12:31 PM  Between 7am to 6pm - Pager - [phone] After 6pm go to [email or web address] - password [email or web address] [facility]  Office  [phone]  CC: Primary care physician; Volanda Napoleon, MD

## 2015-09-02 ENCOUNTER — Observation Stay: Payer: Medicare Other

## 2015-09-02 DIAGNOSIS — R569 Unspecified convulsions: Secondary | ICD-10-CM

## 2015-09-02 LAB — GLUCOSE, CAPILLARY
Glucose-Capillary: 141 mg/dL — ABNORMAL HIGH (ref 65–99)
Glucose-Capillary: 131 mg/dL — ABNORMAL HIGH (ref 65–99)

## 2015-09-02 MED ORDER — OXCARBAZEPINE 150 MG PO TABS: 150.0000 mg | ORAL_TABLET | Freq: Two times a day (BID) | ORAL | Status: DC

## 2015-09-02 MED ORDER — BUTALBITAL-APAP-CAFFEINE 50-325-40 MG PO TABS: 1.0000 | ORAL_TABLET | ORAL | Status: DC | PRN

## 2015-09-02 MED ORDER — OXCARBAZEPINE 150 MG PO TABS
150.0000 mg | ORAL_TABLET | Freq: Two times a day (BID) | ORAL | Status: DC
  Administered 2015-09-02: 150 mg via ORAL
  Filled 2015-09-02: qty 1

## 2015-09-02 MED ORDER — OXCARBAZEPINE 300 MG PO TABS: 300.0000 mg | ORAL_TABLET | Freq: Two times a day (BID) | ORAL | Status: DC

## 2015-09-02 NOTE — Progress Notes (Addendum)
Pt discharged home today per MD order. Home health set up. IV removed. Discharge papers reviewed with the patient. Activity, diet, follow up care and medicines reviewed with the patient. All questions answered. Pt verbalized understanding. Waiting on patients ride. Pt left via wheelchair with nursing and visitor.

## 2015-09-02 NOTE — Progress Notes (Addendum)
NEUROLOGY NOTE  S: Pt feels like headache is somewhat better, L sided weakness resolved  ROS neg x 8 systems except for neck pain, fatigue  O: 98.7    108/53    71    18 No distress, obese Normocephalic, oropharynx clear Supple, no JVD CTA B, no wheezing RRR, no murmur No C/C/E  A+Ox3, nl speech and language but somewhat sleepy today PERRLA, EOMI, face symmetric 5-/5 B, nl tone  CT of neck personally reviewed by me and shows no neck masses  A/P: 1. L sided weakness-  Resolved, still appears to be functional to me and not neurologic 2.  Headache-  Improved, sounds more like tension -  EEG pending -  PRN Fioricet ok for headache -  Continue all home medications as listed now -  Will sign off but will follow EEG results, please call with questions -  Needs to f/u with [facility] in 3 months   EEG with nonspecific sharp waves which could point to epileptic focus.  Start Trileptal 150mg  BID x 3 days then 300mg  BID.  No driving or operating heavy machinery x 6 months.  Can still be discharged today with [facility] f/u in 3 months.  PRN Fioricet as well

## 2015-09-02 NOTE — Care Management (Addendum)
Admitted to [facility] with the diagnosis of TIA. Grandson lives with her, but he is going into the [facility] on Monday, Son is Lowella Fairy [phone]). [facility] in the past. No skilled facility. Night oxygen at 2 liters thru [facility] x 7-8 years. Bedside Commode and rolling walker in the home. Last seen Dr. Fuller Song on Tuesday. Good appetite. [facility] about one month ago. Personal Care services in the past. Trying to get her personal care services resumed Physical therapy evaluation completed. Recommends home with home health and physical therapy. Trying to get in touch with Marzella Schlein (her past aide) to find out what the name of the company is and will transport her home. Discharge to home today per Dr. Volanda Napoleon. Telephone call to nursing assistant Joslyn Devon. States that Ms. Merlo received personal care services thru [facility] in [place]. Received nursing and therapy thru [facility]. Will arrange follow-up services thru [facility].  Shelbie Ammons RN MSN Care Management [phone]

## 2015-09-02 NOTE — Discharge Instructions (Signed)
DIET:  Cardiac diet  DISCHARGE CONDITION:  Stable  ACTIVITY:  Activity as tolerated NO DRIVING  OXYGEN:  Home Oxygen: No.   Oxygen Delivery: room air  DISCHARGE LOCATION:  Home with home health   If you experience worsening of your admission symptoms, develop shortness of breath, life threatening emergency, suicidal or homicidal thoughts you must seek medical attention immediately by calling 911 or calling your MD immediately  if symptoms less severe.  You Must read complete instructions/literature along with all the possible adverse reactions/side effects for all the Medicines you take and that have been prescribed to you. Take any new Medicines after you have completely understood and accpet all the possible adverse reactions/side effects.   Please note  You were cared for by a hospitalist during your hospital stay. If you have any questions about your discharge medications or the care you received while you were in the hospital after you are discharged, you can call the unit and asked to speak with the hospitalist on call if the hospitalist that took care of you is not available. Once you are discharged, your primary care physician will handle any further medical issues. Please note that NO REFILLS for any discharge medications will be authorized once you are discharged, as it is imperative that you return to your primary care physician (or establish a relationship with a primary care physician if you do not have one) for your aftercare needs so that they can reassess your need for medications and monitor your lab values.   Transient Ischemic Attack A transient ischemic attack (TIA) is a "warning stroke" that causes stroke-like symptoms. A TIA does not cause lasting damage to the brain. The symptoms of a TIA can happen fast and do not last long. It is important to know the symptoms of a TIA and what to do. This can help prevent stroke or death.  HOME CARE   Take medicines only as  told by your doctor. Make sure you understand all of the instructions.  You may need to take aspirin or warfarin medicine. Warfarin needs to be taken exactly as told.  Taking too much or too little warfarin is dangerous. Blood tests must be done as often as told by your doctor. A PT blood test measures how long it takes for blood to clot. Your PT is used to calculate another value called an INR. Your PT and INR help your doctor adjust your warfarin dosage. He or she will make sure you are taking the right amount.  Food can cause problems with warfarin and affect the results of your blood tests. This is true for foods high in vitamin K. Eat the same amount of foods high in vitamin K each day. Foods high in vitamin K include spinach, kale, broccoli, cabbage, collard and turnip greens, Brussels sprouts, peas, cauliflower, seaweed, and parsley. Other foods high in vitamin K include beef and pork liver, green tea, and soybean oil. Eat the same amount of foods high in vitamin K each day. Avoid big changes in your diet. Tell your doctor before changing your diet. Talk to a food specialist (dietitian) if you have questions.  Many medicines can cause problems with warfarin and affect your PT and INR. Tell your doctor about all medicines you take. This includes vitamins and dietary pills (supplements). Do not take or stop taking any prescribed or over-the-counter medicines unless your doctor tells you to.  Warfarin can cause more bruising or bleeding. Hold pressure over any cuts for  longer than normal. Talk to your doctor about other side effects of warfarin.  Avoid sports or activities that may cause injury or bleeding.  Be careful when you shave, floss, or use sharp objects.  Avoid or drink very little alcohol while taking warfarin. Tell your doctor if you change how much alcohol you drink.  Tell your dentist and other doctors that you take warfarin before any procedures.  Follow your diet program as  told, if you are given one.  Keep a healthy weight.  Stay active. Try to get at least 30 minutes of activity on all or most days.  Do not use any tobacco products, including cigarettes, chewing tobacco, or electronic cigarettes. If you need help quitting, ask your doctor.  Limit alcohol intake to no more than 1 drink per day for nonpregnant women and 2 drinks per day for men. One drink equals 12 ounces of beer, 5 ounces of wine, or 1 ounces of hard liquor.  Do not abuse drugs.  Keep your home safe so you do not fall. You can do this by:  Putting grab bars in the bedroom and bathroom.  Raising toilet seats.  Putting a seat in the shower.  Keep all follow-up visits as told by your doctor. This is important. GET HELP IF:  Your personality changes.  You have trouble swallowing.  You have double vision.  You are dizzy.  You have a fever. GET HELP RIGHT AWAY IF:  These symptoms may be an emergency. Do not wait to see if the symptoms will go away. Get medical help right away. Call your local emergency services (911 in the [place] Do not drive yourself to the hospital.  You have sudden weakness or lose feeling (go numb), especially on one side of the body. This can affect your:  Face.  Arm.  Leg.  You have sudden trouble walking.  You have sudden trouble moving your arms or legs.  You have sudden confusion.  You have trouble talking.  You have trouble understanding.  You have sudden trouble seeing in one or both eyes.  You lose your balance.  Your movements are not smooth.  You have a sudden, very bad headache with no known cause.  You have new chest pain.  Your heartbeat is unsteady.  You are partly or totally unaware of what is going on around you. MAKE SURE YOU:   Understand these instructions.  Will watch your condition.  Will get help right away if you are not doing well or get worse.   This information is not intended to replace advice given to  you by your health care provider. Make sure you discuss any questions you have with your health care provider.   Document Released: 08/22/2008 Document Revised: 12/04/2014 Document Reviewed: 02/18/2014 Elsevier Interactive Patient Education [employer].

## 2015-09-02 NOTE — Plan of Care (Signed)
Problem: Discharge/Transitional Outcomes Goal: Other Discharge Outcomes/Goals Outcome: Progressing Plan of Care Progress to Goal:   Pt has been having headache with minimal relief during shift. BP is elevated. No other signs of distress noted. Will continue to monitor.

## 2015-11-08 DIAGNOSIS — Z6841 Body Mass Index (BMI) 40.0 and over, adult: Secondary | ICD-10-CM

## 2015-11-08 DIAGNOSIS — G4440 Drug-induced headache, not elsewhere classified, not intractable: Secondary | ICD-10-CM | POA: Insufficient documentation

## 2015-11-10 ENCOUNTER — Other Ambulatory Visit: Payer: Self-pay | Admitting: Neurology

## 2015-11-10 DIAGNOSIS — G932 Benign intracranial hypertension: Secondary | ICD-10-CM

## 2015-11-17 ENCOUNTER — Ambulatory Visit: Payer: Self-pay

## 2015-11-19 ENCOUNTER — Other Ambulatory Visit: Payer: Self-pay | Admitting: Internal Medicine

## 2015-11-19 ENCOUNTER — Ambulatory Visit
Admission: RE | Admit: 2015-11-19 | Discharge: 2015-11-19 | Disposition: A | Discharge status: Home / Self Care | Payer: Medicare Other | Source: Ambulatory Visit | Attending: Internal Medicine | Admitting: Internal Medicine

## 2015-11-19 DIAGNOSIS — Z1231 Encounter for screening mammogram for malignant neoplasm of breast: Secondary | ICD-10-CM

## 2015-11-26 ENCOUNTER — Ambulatory Visit: Admission: RE | Admit: 2015-11-26 | Payer: Medicare Other | Source: Ambulatory Visit

## 2016-09-26 ENCOUNTER — Emergency Department: Payer: Medicare Other

## 2016-09-26 ENCOUNTER — Encounter: Payer: Self-pay | Admitting: Emergency Medicine

## 2016-09-26 ENCOUNTER — Inpatient Hospital Stay
Admission: EM | Admit: 2016-09-26 | Discharge: 2016-09-29 | DRG: 641 | Disposition: A | Discharge status: Home / Self Care | Payer: Medicare Other | Attending: Internal Medicine | Admitting: Internal Medicine

## 2016-09-26 DIAGNOSIS — Z886 Allergy status to analgesic agent status: Secondary | ICD-10-CM

## 2016-09-26 DIAGNOSIS — G4733 Obstructive sleep apnea (adult) (pediatric): Secondary | ICD-10-CM | POA: Diagnosis present

## 2016-09-26 DIAGNOSIS — Z79899 Other long term (current) drug therapy: Secondary | ICD-10-CM

## 2016-09-26 DIAGNOSIS — D72829 Elevated white blood cell count, unspecified: Secondary | ICD-10-CM

## 2016-09-26 DIAGNOSIS — Z9884 Bariatric surgery status: Secondary | ICD-10-CM

## 2016-09-26 DIAGNOSIS — N289 Disorder of kidney and ureter, unspecified: Secondary | ICD-10-CM

## 2016-09-26 DIAGNOSIS — Z8711 Personal history of peptic ulcer disease: Secondary | ICD-10-CM

## 2016-09-26 DIAGNOSIS — E860 Dehydration: Secondary | ICD-10-CM | POA: Diagnosis not present

## 2016-09-26 DIAGNOSIS — I252 Old myocardial infarction: Secondary | ICD-10-CM

## 2016-09-26 DIAGNOSIS — R0902 Hypoxemia: Secondary | ICD-10-CM | POA: Diagnosis present

## 2016-09-26 DIAGNOSIS — K219 Gastro-esophageal reflux disease without esophagitis: Secondary | ICD-10-CM | POA: Diagnosis present

## 2016-09-26 DIAGNOSIS — Z8673 Personal history of transient ischemic attack (TIA), and cerebral infarction without residual deficits: Secondary | ICD-10-CM

## 2016-09-26 DIAGNOSIS — R7303 Prediabetes: Secondary | ICD-10-CM | POA: Diagnosis present

## 2016-09-26 DIAGNOSIS — K2900 Acute gastritis without bleeding: Secondary | ICD-10-CM | POA: Diagnosis present

## 2016-09-26 DIAGNOSIS — Z888 Allergy status to other drugs, medicaments and biological substances status: Secondary | ICD-10-CM

## 2016-09-26 DIAGNOSIS — D509 Iron deficiency anemia, unspecified: Secondary | ICD-10-CM | POA: Diagnosis present

## 2016-09-26 DIAGNOSIS — D649 Anemia, unspecified: Secondary | ICD-10-CM

## 2016-09-26 DIAGNOSIS — Z8249 Family history of ischemic heart disease and other diseases of the circulatory system: Secondary | ICD-10-CM

## 2016-09-26 DIAGNOSIS — I119 Hypertensive heart disease without heart failure: Secondary | ICD-10-CM | POA: Diagnosis present

## 2016-09-26 DIAGNOSIS — R55 Syncope and collapse: Secondary | ICD-10-CM | POA: Diagnosis not present

## 2016-09-26 DIAGNOSIS — R7989 Other specified abnormal findings of blood chemistry: Secondary | ICD-10-CM | POA: Diagnosis present

## 2016-09-26 DIAGNOSIS — Z885 Allergy status to narcotic agent status: Secondary | ICD-10-CM

## 2016-09-26 DIAGNOSIS — R1013 Epigastric pain: Secondary | ICD-10-CM

## 2016-09-26 DIAGNOSIS — Z6841 Body Mass Index (BMI) 40.0 and over, adult: Secondary | ICD-10-CM

## 2016-09-26 DIAGNOSIS — Z452 Encounter for adjustment and management of vascular access device: Secondary | ICD-10-CM

## 2016-09-26 DIAGNOSIS — E669 Obesity, unspecified: Secondary | ICD-10-CM | POA: Diagnosis present

## 2016-09-26 DIAGNOSIS — R198 Other specified symptoms and signs involving the digestive system and abdomen: Secondary | ICD-10-CM

## 2016-09-26 DIAGNOSIS — E785 Hyperlipidemia, unspecified: Secondary | ICD-10-CM | POA: Diagnosis present

## 2016-09-26 DIAGNOSIS — K529 Noninfective gastroenteritis and colitis, unspecified: Secondary | ICD-10-CM

## 2016-09-26 DIAGNOSIS — Z7902 Long term (current) use of antithrombotics/antiplatelets: Secondary | ICD-10-CM

## 2016-09-26 DIAGNOSIS — E559 Vitamin D deficiency, unspecified: Secondary | ICD-10-CM | POA: Diagnosis present

## 2016-09-26 DIAGNOSIS — I959 Hypotension, unspecified: Secondary | ICD-10-CM | POA: Diagnosis present

## 2016-09-26 DIAGNOSIS — Z825 Family history of asthma and other chronic lower respiratory diseases: Secondary | ICD-10-CM

## 2016-09-26 DIAGNOSIS — Z91013 Allergy to seafood: Secondary | ICD-10-CM

## 2016-09-26 DIAGNOSIS — J449 Chronic obstructive pulmonary disease, unspecified: Secondary | ICD-10-CM | POA: Diagnosis present

## 2016-09-26 DIAGNOSIS — Z91041 Radiographic dye allergy status: Secondary | ICD-10-CM

## 2016-09-26 HISTORY — DX: Peptic ulcer, site unspecified, unspecified as acute or chronic, without hemorrhage or perforation: K27.9

## 2016-09-26 LAB — CBC WITH DIFFERENTIAL/PLATELET
BASOS PCT: 0 %
Basophils Absolute: 0 10*3/uL (ref 0–0.1)
EOS ABS: 0.1 10*3/uL (ref 0–0.7)
EOS PCT: 1 %
HCT: 33.6 % — ABNORMAL LOW (ref 35.0–47.0)
Hemoglobin: 10.3 g/dL — ABNORMAL LOW (ref 12.0–16.0)
Lymphocytes Relative: 20 %
Lymphs Abs: 1.8 10*3/uL (ref 1.0–3.6)
MCH: 23.2 pg — ABNORMAL LOW (ref 26.0–34.0)
MCHC: 30.7 g/dL — AB (ref 32.0–36.0)
MCV: 75.3 fL — ABNORMAL LOW (ref 80.0–100.0)
MONO ABS: 0.7 10*3/uL (ref 0.2–0.9)
MONOS PCT: 8 %
NEUTROS PCT: 71 %
Neutro Abs: 6.4 10*3/uL (ref 1.4–6.5)
PLATELETS: 404 10*3/uL (ref 150–440)
RBC: 4.46 MIL/uL (ref 3.80–5.20)
RDW: 20 % — AB (ref 11.5–14.5)
WBC: 9 10*3/uL (ref 3.6–11.0)

## 2016-09-26 LAB — COMPREHENSIVE METABOLIC PANEL
ALK PHOS: 124 U/L (ref 38–126)
ALT: 24 U/L (ref 14–54)
AST: 23 U/L (ref 15–41)
Albumin: 3.2 g/dL — ABNORMAL LOW (ref 3.5–5.0)
Anion gap: 7 (ref 5–15)
BUN: 30 mg/dL — AB (ref 6–20)
CALCIUM: 8.5 mg/dL — AB (ref 8.9–10.3)
CO2: 23 mmol/L (ref 22–32)
CREATININE: 1.46 mg/dL — AB (ref 0.44–1.00)
Chloride: 107 mmol/L (ref 101–111)
GFR calc non Af Amer: 39 mL/min — ABNORMAL LOW (ref >60)
GFR, EST AFRICAN AMERICAN: 45 mL/min — AB (ref >60)
GLUCOSE: 143 mg/dL — AB (ref 65–99)
Potassium: 3.7 mmol/L (ref 3.5–5.1)
SODIUM: 137 mmol/L (ref 135–145)
Total Bilirubin: <0.1 mg/dL — ABNORMAL LOW (ref 0.3–1.2)
Total Protein: 7.1 g/dL (ref 6.5–8.1)

## 2016-09-26 LAB — TROPONIN I

## 2016-09-26 LAB — LIPASE, BLOOD: Lipase: 33 U/L (ref 11–51)

## 2016-09-26 MED ORDER — GI COCKTAIL ~~LOC~~
30.0000 mL | Freq: Once | ORAL | Status: AC
  Administered 2016-09-26: 30 mL via ORAL

## 2016-09-26 MED ORDER — PANTOPRAZOLE SODIUM 40 MG PO TBEC
DELAYED_RELEASE_TABLET | ORAL | Status: AC
  Administered 2016-09-26: 40 mg via ORAL
  Filled 2016-09-26: qty 1

## 2016-09-26 MED ORDER — ONDANSETRON HCL 4 MG/2ML IJ SOLN
4.0000 mg | Freq: Once | INTRAMUSCULAR | Status: AC
  Administered 2016-09-26: 4 mg via INTRAVENOUS
  Filled 2016-09-26: qty 2

## 2016-09-26 MED ORDER — BARIUM SULFATE 2.1 % PO SUSP
900.0000 mL | Freq: Once | ORAL | Status: AC
  Administered 2016-09-26: 900 mL via ORAL

## 2016-09-26 MED ORDER — PANTOPRAZOLE SODIUM 40 MG PO TBEC
40.0000 mg | DELAYED_RELEASE_TABLET | Freq: Every day | ORAL | Status: DC
  Administered 2016-09-26 – 2016-09-27 (×2): 40 mg via ORAL
  Filled 2016-09-26: qty 1

## 2016-09-26 MED ORDER — SODIUM CHLORIDE 0.9 % IV SOLN
Freq: Once | INTRAVENOUS | Status: AC
  Administered 2016-09-26: 23:00:00 via INTRAVENOUS

## 2016-09-26 MED ORDER — HYDROMORPHONE HCL 1 MG/ML IJ SOLN
0.5000 mg | Freq: Once | INTRAMUSCULAR | Status: AC
  Administered 2016-09-26: 0.5 mg via INTRAVENOUS
  Filled 2016-09-26: qty 1

## 2016-09-26 MED ORDER — GI COCKTAIL ~~LOC~~
ORAL | Status: AC
  Filled 2016-09-26: qty 30

## 2016-09-26 MED ORDER — GI COCKTAIL ~~LOC~~
30.0000 mL | Freq: Once | ORAL | Status: AC
  Administered 2016-09-26: 30 mL via ORAL
  Filled 2016-09-26: qty 30

## 2016-09-26 NOTE — ED Provider Notes (Addendum)
[facility] Emergency Department Provider Note   ____________________________________________   First MD Initiated Contact with Patient 09/26/16 1830     (approximate)  I have reviewed the triage vital signs and the nursing notes.   HISTORY  Chief Complaint Loss of Consciousness    HPI Lindsey [name] is a 55 y.o. female who reports she drank some apple side or vinegar to help her to try to lose weight. She drank it she began to have epigastric pain which was fairly severe she got sweaty and felt woozy watch your vision gray out when she passed out. She felt herself going down. She denies any chest pain shortness of breath or any other problems except the epigastric pain started when she drank vinegar.   Past Medical History:  Diagnosis Date  . Allergy   . COPD (chronic obstructive pulmonary disease) ([place])   . Diabetes mellitus without complication ([place])   . GERD (gastroesophageal reflux disease)   . Heart attack   . Hyperlipidemia   . Hypertension   . Sleep apnea   . Stroke [facility])     Patient Active Problem List   Diagnosis Date Noted  . Seizures ([place]) 09/02/2015  . 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 Disease of female genital organs 04/01/2013  . Difficult or painful urination 03/18/2013  . Urge incontinence 03/18/2013  . Urgency of micturation 03/18/2013    Past Surgical History:  Procedure Laterality Date  . ABDOMINAL HYSTERECTOMY    . back surgery    . broken wrist    . GALLBLADDER SURGERY    . GASTRIC BYPASS    . HERNIA REPAIR    . REPLACEMENT TOTAL KNEE    . ROTATOR CUFF REPAIR      Prior to Admission medications   Medication Sig Start Date End Date Taking? Authorizing Provider  albuterol (PROVENTIL HFA;VENTOLIN HFA) 108 (90 BASE) MCG/ACT inhaler Inhale 2 puffs into the lungs every 4 (four) hours as needed for wheezing or shortness of breath.    Historical Provider, MD  albuterol  (PROVENTIL) (2.5 MG/3ML) 0.083% nebulizer solution Take 2.5 mg by nebulization every 6 (six) hours as needed for wheezing or shortness of breath.    Historical Provider, MD  ALPRAZolam Duanne Moron) 1 MG tablet Take 1-1.5 mg by mouth 2 (two) times daily. Pt takes one tablet in the morning and one and one-half tablet at night.    Historical Provider, MD  atorvastatin (LIPITOR) 40 MG tablet Take 1 tablet (40 mg total) by mouth daily at 6 PM. 09/01/15   Bettey Costa, MD  baclofen (LIORESAL) 10 MG tablet Take 10 mg by mouth 3 (three) times daily.    Historical Provider, MD  benzonatate (TESSALON) 100 MG capsule Take 100 mg by mouth 3 (three) times daily as needed for cough.    Historical Provider, MD  butalbital-acetaminophen-caffeine (FIORICET, ESGIC) 50-325-40 MG tablet Take 1 tablet by mouth every 4 (four) hours as needed for headache. 09/02/15   Aldean Jewett, MD  Calcium Lactate 648 MG TABS Take 648 mg by mouth 2 (two) times daily.    Historical Provider, MD  cloNIDine (CATAPRES) 0.1 MG tablet Take 0.1 mg by mouth at bedtime.    Historical Provider, MD  clopidogrel (PLAVIX) 75 MG tablet Take 1 tablet (75 mg total) by mouth daily. 09/01/15   Bettey Costa, MD  desvenlafaxine (PRISTIQ) 100 MG 24 hr tablet Take 100 mg by mouth daily.    Historical Provider,  MD  esomeprazole (NEXIUM) 40 MG capsule Take 40 mg by mouth 2 (two) times daily.    Historical Provider, MD  fentaNYL (DURAGESIC - DOSED MCG/HR) 50 MCG/HR Place 50 mcg onto the skin every 3 (three) days.    Historical Provider, MD  fexofenadine (ALLEGRA) 60 MG tablet Take 60 mg by mouth 2 (two) times daily.    Historical Provider, MD  fluticasone (FLONASE) 50 MCG/ACT nasal spray Place 1 spray into both nostrils 2 (two) times daily.    Historical Provider, MD  folic acid (FOLVITE) 1 MG tablet Take 1 mg by mouth daily.    Historical Provider, MD  furosemide (LASIX) 20 MG tablet Take 20 mg by mouth daily.     Historical Provider, MD  hydrOXYzine  (ATARAX/VISTARIL) 25 MG tablet Take 25 mg by mouth 3 (three) times daily.    Historical Provider, MD  l-methylfolate-B6-B12 (METANX) 3-35-2 MG TABS tablet Take 1 tablet by mouth 2 (two) times daily.    Historical Provider, MD  metoprolol succinate (TOPROL-XL) 100 MG 24 hr tablet Take 100 mg by mouth daily.    Historical Provider, MD  mirtazapine (REMERON) 45 MG tablet Take 45 mg by mouth at bedtime.    Historical Provider, MD  olmesartan-hydrochlorothiazide (BENICAR HCT) 40-12.5 MG tablet Take 1 tablet by mouth daily.    Historical Provider, MD  OXcarbazepine (TRILEPTAL) 150 MG tablet Take 1 tablet (150 mg total) by mouth 2 (two) times daily. Take twice a day for 3 days and then start higher dose. 09/02/15   Aldean Jewett, MD  Oxcarbazepine (TRILEPTAL) 300 MG tablet Take 1 tablet (300 mg total) by mouth 2 (two) times daily. Start this prescription after completing 3 days of 150 mg twice a day. 09/02/15   Aldean Jewett, MD  oxybutynin (DITROPAN) 5 MG tablet Take 5 mg by mouth 2 (two) times daily.    Historical Provider, MD  oxyCODONE (OXY IR/ROXICODONE) 5 MG immediate release tablet Take 5 mg by mouth every 6 (six) hours as needed for severe pain.    Historical Provider, MD  Phendimetrazine Tartrate 35 MG TABS Take 35 mg by mouth 3 (three) times daily.    Historical Provider, MD  potassium chloride (K-DUR) 10 MEQ tablet Take 10 mEq by mouth daily.    Historical Provider, MD  zolpidem (AMBIEN) 10 MG tablet Take 10 mg by mouth at bedtime as needed for sleep.    Historical Provider, MD    Allergies Morphine; Iodinated diagnostic agents; Aspirin; Etodolac; Ibuprofen; and Shellfish allergy  History reviewed. No pertinent family history.  Social History Social History  Substance Use Topics  . Smoking status: Never Smoker  . Smokeless tobacco: Never Used  . Alcohol use No    Review of Systems Constitutional: No fever/chills Eyes: No visual changes. ENT: No sore throat. Cardiovascular:  Denies chest pain. Respiratory: Denies shortness of breath. Gastrointestinal: See history of present illness Genitourinary: Negative for dysuria. Musculoskeletal: Negative for back pain. Skin: Negative for rash. Neurological: Negative for headaches, focal weakness or numbness.  10-point ROS otherwise negative.  ____________________________________________   PHYSICAL EXAM:  VITAL SIGNS: ED Triage Vitals  Enc Vitals Group     BP 09/26/16 1751 112/71     Pulse Rate 09/26/16 1751 75     Resp 09/26/16 1751 18     Temp 09/26/16 1751 98.3 F (36.8 C)     Temp Source 09/26/16 1751 Oral     SpO2 09/26/16 1751 99 %     Weight  09/26/16 1808 297 lb (134.7 kg)     Height 09/26/16 1808 5\' 7"  (1.702 m)     Head Circumference --      Peak Flow --      Pain Score 09/26/16 1809 7     Pain Loc --      Pain Edu? --      Excl. in [place]? --     Constitutional: Alert and oriented. Well appearing and in no acute distress. Eyes: Conjunctivae are normal. PERRL. EOMI. Head: Atraumatic. Nose: No congestion/rhinnorhea. Mouth/Throat: Mucous membranes are moist.  Oropharynx non-erythematous. Neck: No stridor.  Cardiovascular: Normal rate, regular rhythm. Grossly normal heart sounds.  Good peripheral circulation. Respiratory: Normal respiratory effort.  No retractions. Lungs CTAB. Gastrointestinal: Soft and nontenderExcept for in the epigastric area where it is very tender.. No distention. No abdominal bruits. No CVA tenderness. Musculoskeletal: No lower extremity tenderness nor edema.  No joint effusions. Neurologic:  Normal speech and language. No gross focal neurologic deficits are appreciated. No gait instability. Skin:  Skin is warm, dry and intact. No rash noted. Psychiatric: Mood and affect are normal. Speech and behavior are normal.  ____________________________________________   LABS (all labs ordered are listed, but only abnormal results are displayed)  Labs Reviewed  COMPREHENSIVE  METABOLIC PANEL - Abnormal; Notable for the following:       Result Value   Glucose, Bld 143 (*)    BUN 30 (*)    Creatinine, Ser 1.46 (*)    Calcium 8.5 (*)    Albumin 3.2 (*)    Total Bilirubin <0.1 (*)    GFR calc non Af Amer 39 (*)    GFR calc Af Amer 45 (*)    All other components within normal limits  CBC WITH DIFFERENTIAL/PLATELET - Abnormal; Notable for the following:    Hemoglobin 10.3 (*)    HCT 33.6 (*)    MCV 75.3 (*)    MCH 23.2 (*)    MCHC 30.7 (*)    RDW 20.0 (*)    All other components within normal limits  TROPONIN I  TROPONIN I  URINALYSIS COMPLETEWITH MICROSCOPIC ([facility] ONLY)  LIPASE, BLOOD  CBG MONITORING, ED   ____________________________________________  EKG EKG read and interpreted by me shows normal sinus rhythm rate of 60 patient's stimulator in her back is causing a lot of artifact but there are no acute ST-T wave changes that I can see.  ____________________________________________  RADIOLOGY  Study Result   CLINICAL DATA:  Initial evaluation for acute syncope.  EXAM: CHEST  2 VIEW  COMPARISON:  Prior radiograph from 06/29/2014.  FINDINGS: Mild cardiomegaly is stable. Mediastinal silhouette within normal limits.  Lungs normally inflated. No focal infiltrate, pulmonary edema, or pleural effusion. No pneumothorax.  No acute osseous abnormality.  Spinal stimulator device noted.  IMPRESSION: No active cardiopulmonary disease.   Electronically Signed   By: Jeannine Boga M.D.   On: 09/26/2016 19:18    ____________________________________________   PROCEDURES  Procedure(s) performed:   Procedures  Critical Care performed:   ____________________________________________   INITIAL IMPRESSION / ASSESSMENT AND PLAN / ED COURSE  Pertinent labs & imaging results that were available during my care of the patient were reviewed by me and considered in my medical decision making (see chart for  details).    Clinical Course   Patient was doing well getting up to go the bathroom got worse belly pain gets sweaty lightheaded thought she was given a pass out laid back down again  pain has now moved to the mid abdomen from the epigastric area. I will sign the patient out to Dr. Quentin Cornwall. ____________________________________________   FINAL CLINICAL IMPRESSION(S) / ED DIAGNOSES  Final diagnoses:  Syncope, unspecified syncope type  Epigastric pain      NEW MEDICATIONS STARTED DURING THIS VISIT:  New Prescriptions   No medications on file     Note:  This document was prepared using Dragon voice recognition software and may include unintentional dictation errors.    Nena Polio, MD 09/26/16 [id number]    Nena Polio, MD 09/26/16 [phone]

## 2016-09-26 NOTE — ED Notes (Signed)
Lab here to attempt phlebotomy collect at this time.

## 2016-09-26 NOTE — ED Notes (Signed)
Unable to perform EKG in hallway; 2 machines had too much interference. We have EMS EKG and will do one when pt moved to room 5.

## 2016-09-26 NOTE — ED Triage Notes (Signed)
Pt via ems from home after suffering syncopal episode. She drank some apple cider vinegar (as a diet aid) and began to feel sweaty and weak and passed out. Pt states she was hot and flushed before she passed out and is now cold.  NAD noted.

## 2016-09-27 ENCOUNTER — Emergency Department: Payer: Medicare Other

## 2016-09-27 ENCOUNTER — Inpatient Hospital Stay: Payer: Medicare Other

## 2016-09-27 ENCOUNTER — Inpatient Hospital Stay
Admit: 2016-09-27 | Discharge: 2016-09-27 | Disposition: A | Discharge status: Home / Self Care | Payer: Medicare Other | Attending: Internal Medicine | Admitting: Internal Medicine

## 2016-09-27 ENCOUNTER — Encounter: Payer: Self-pay | Admitting: Internal Medicine

## 2016-09-27 DIAGNOSIS — D509 Iron deficiency anemia, unspecified: Secondary | ICD-10-CM | POA: Diagnosis present

## 2016-09-27 DIAGNOSIS — J449 Chronic obstructive pulmonary disease, unspecified: Secondary | ICD-10-CM | POA: Diagnosis present

## 2016-09-27 DIAGNOSIS — Z6841 Body Mass Index (BMI) 40.0 and over, adult: Secondary | ICD-10-CM | POA: Diagnosis not present

## 2016-09-27 DIAGNOSIS — K2900 Acute gastritis without bleeding: Secondary | ICD-10-CM | POA: Diagnosis present

## 2016-09-27 DIAGNOSIS — R55 Syncope and collapse: Secondary | ICD-10-CM | POA: Diagnosis present

## 2016-09-27 DIAGNOSIS — I959 Hypotension, unspecified: Secondary | ICD-10-CM | POA: Diagnosis present

## 2016-09-27 DIAGNOSIS — E669 Obesity, unspecified: Secondary | ICD-10-CM | POA: Diagnosis present

## 2016-09-27 DIAGNOSIS — Z888 Allergy status to other drugs, medicaments and biological substances status: Secondary | ICD-10-CM | POA: Diagnosis not present

## 2016-09-27 DIAGNOSIS — E559 Vitamin D deficiency, unspecified: Secondary | ICD-10-CM | POA: Diagnosis present

## 2016-09-27 DIAGNOSIS — R7303 Prediabetes: Secondary | ICD-10-CM | POA: Diagnosis present

## 2016-09-27 DIAGNOSIS — E785 Hyperlipidemia, unspecified: Secondary | ICD-10-CM | POA: Diagnosis present

## 2016-09-27 DIAGNOSIS — N289 Disorder of kidney and ureter, unspecified: Secondary | ICD-10-CM | POA: Diagnosis present

## 2016-09-27 DIAGNOSIS — E860 Dehydration: Secondary | ICD-10-CM | POA: Diagnosis present

## 2016-09-27 DIAGNOSIS — I252 Old myocardial infarction: Secondary | ICD-10-CM | POA: Diagnosis not present

## 2016-09-27 DIAGNOSIS — I119 Hypertensive heart disease without heart failure: Secondary | ICD-10-CM | POA: Diagnosis present

## 2016-09-27 DIAGNOSIS — R7989 Other specified abnormal findings of blood chemistry: Secondary | ICD-10-CM | POA: Diagnosis present

## 2016-09-27 DIAGNOSIS — K219 Gastro-esophageal reflux disease without esophagitis: Secondary | ICD-10-CM | POA: Diagnosis present

## 2016-09-27 DIAGNOSIS — G4733 Obstructive sleep apnea (adult) (pediatric): Secondary | ICD-10-CM | POA: Diagnosis present

## 2016-09-27 DIAGNOSIS — R0902 Hypoxemia: Secondary | ICD-10-CM | POA: Diagnosis present

## 2016-09-27 LAB — CBC
HCT: 28.9 % — ABNORMAL LOW (ref 35.0–47.0)
HEMOGLOBIN: 9.2 g/dL — AB (ref 12.0–16.0)
MCH: 23.6 pg — AB (ref 26.0–34.0)
MCHC: 31.8 g/dL — ABNORMAL LOW (ref 32.0–36.0)
MCV: 74.1 fL — ABNORMAL LOW (ref 80.0–100.0)
Platelets: 361 10*3/uL (ref 150–440)
RBC: 3.9 MIL/uL (ref 3.80–5.20)
RDW: 19.8 % — ABNORMAL HIGH (ref 11.5–14.5)
WBC: 13.7 10*3/uL — ABNORMAL HIGH (ref 3.6–11.0)

## 2016-09-27 LAB — BASIC METABOLIC PANEL
ANION GAP: 4 — AB (ref 5–15)
BUN: 26 mg/dL — ABNORMAL HIGH (ref 6–20)
CALCIUM: 8.3 mg/dL — AB (ref 8.9–10.3)
CO2: 24 mmol/L (ref 22–32)
CREATININE: 1.14 mg/dL — AB (ref 0.44–1.00)
Chloride: 110 mmol/L (ref 101–111)
GFR, EST NON AFRICAN AMERICAN: 52 mL/min — AB (ref >60)
Glucose, Bld: 117 mg/dL — ABNORMAL HIGH (ref 65–99)
Potassium: 3.9 mmol/L (ref 3.5–5.1)
SODIUM: 138 mmol/L (ref 135–145)

## 2016-09-27 LAB — URINALYSIS COMPLETE WITH MICROSCOPIC (ARMC ONLY)
Bilirubin Urine: NEGATIVE
GLUCOSE, UA: NEGATIVE mg/dL
Hgb urine dipstick: NEGATIVE
Ketones, ur: NEGATIVE mg/dL
LEUKOCYTES UA: NEGATIVE
NITRITE: NEGATIVE
Protein, ur: NEGATIVE mg/dL
SPECIFIC GRAVITY, URINE: 1.017 (ref 1.005–1.030)
pH: 5 (ref 5.0–8.0)

## 2016-09-27 LAB — TYPE AND SCREEN
ABO/RH(D): A POS
Antibody Screen: NEGATIVE

## 2016-09-27 LAB — IRON AND TIBC
IRON: 39 ug/dL (ref 28–170)
SATURATION RATIOS: 10 % — AB (ref 10.4–31.8)
TIBC: 403 ug/dL (ref 250–450)
UIBC: 365 ug/dL

## 2016-09-27 LAB — HEMOGLOBIN: HEMOGLOBIN: 8.9 g/dL — AB (ref 12.0–16.0)

## 2016-09-27 LAB — GLUCOSE, CAPILLARY: GLUCOSE-CAPILLARY: 114 mg/dL — AB (ref 65–99)

## 2016-09-27 LAB — TROPONIN I

## 2016-09-27 LAB — LACTIC ACID, PLASMA: Lactic Acid, Venous: 1.6 mmol/L (ref 0.5–1.9)

## 2016-09-27 LAB — FERRITIN: Ferritin: 7 ng/mL — ABNORMAL LOW (ref 11–307)

## 2016-09-27 MED ORDER — ENOXAPARIN SODIUM 40 MG/0.4ML ~~LOC~~ SOLN: 40.0000 mg | SUBCUTANEOUS | Status: DC

## 2016-09-27 MED ORDER — SODIUM CHLORIDE 0.9% FLUSH
3.0000 mL | Freq: Two times a day (BID) | INTRAVENOUS | Status: DC
  Administered 2016-09-27 – 2016-09-29 (×2): 3 mL via INTRAVENOUS

## 2016-09-27 MED ORDER — ALPRAZOLAM 1 MG PO TABS
1.5000 mg | ORAL_TABLET | Freq: Every evening | ORAL | Status: DC
  Administered 2016-09-27 – 2016-09-28 (×2): 1.5 mg via ORAL
  Filled 2016-09-27: qty 2
  Filled 2016-09-27: qty 1

## 2016-09-27 MED ORDER — ACETAMINOPHEN 650 MG RE SUPP: 650.0000 mg | Freq: Four times a day (QID) | RECTAL | Status: DC | PRN

## 2016-09-27 MED ORDER — PANTOPRAZOLE SODIUM 40 MG PO TBEC
40.0000 mg | DELAYED_RELEASE_TABLET | Freq: Two times a day (BID) | ORAL | Status: DC
  Administered 2016-09-27 – 2016-09-29 (×4): 40 mg via ORAL
  Filled 2016-09-27 (×4): qty 1

## 2016-09-27 MED ORDER — ALBUTEROL SULFATE HFA 108 (90 BASE) MCG/ACT IN AERS: 2.0000 | INHALATION_SPRAY | RESPIRATORY_TRACT | Status: DC | PRN

## 2016-09-27 MED ORDER — HYDROXYZINE HCL 25 MG PO TABS
25.0000 mg | ORAL_TABLET | Freq: Three times a day (TID) | ORAL | Status: DC
  Administered 2016-09-27 – 2016-09-29 (×7): 25 mg via ORAL
  Filled 2016-09-27 (×7): qty 1

## 2016-09-27 MED ORDER — CALCIUM CARBONATE ANTACID 500 MG PO CHEW
500.0000 mg | CHEWABLE_TABLET | Freq: Two times a day (BID) | ORAL | Status: DC
  Administered 2016-09-27 – 2016-09-29 (×5): 500 mg via ORAL
  Filled 2016-09-27 (×5): qty 1

## 2016-09-27 MED ORDER — ENOXAPARIN SODIUM 40 MG/0.4ML ~~LOC~~ SOLN
40.0000 mg | Freq: Two times a day (BID) | SUBCUTANEOUS | Status: DC
  Administered 2016-09-27: 40 mg via SUBCUTANEOUS
  Filled 2016-09-27: qty 0.4

## 2016-09-27 MED ORDER — ONDANSETRON HCL 4 MG/2ML IJ SOLN: 4.0000 mg | Freq: Four times a day (QID) | INTRAMUSCULAR | Status: DC | PRN

## 2016-09-27 MED ORDER — FOLIC ACID 1 MG PO TABS
1.0000 mg | ORAL_TABLET | Freq: Every day | ORAL | Status: DC
  Administered 2016-09-27 – 2016-09-29 (×3): 1 mg via ORAL
  Filled 2016-09-27 (×3): qty 1

## 2016-09-27 MED ORDER — CLOPIDOGREL BISULFATE 75 MG PO TABS
75.0000 mg | ORAL_TABLET | Freq: Every day | ORAL | Status: DC
  Administered 2016-09-27 – 2016-09-29 (×2): 75 mg via ORAL
  Filled 2016-09-27 (×2): qty 1

## 2016-09-27 MED ORDER — HYDROCODONE-ACETAMINOPHEN 5-325 MG PO TABS
1.0000 | ORAL_TABLET | ORAL | Status: DC | PRN
  Filled 2016-09-27: qty 1

## 2016-09-27 MED ORDER — SUCRALFATE 1 G PO TABS
1.0000 g | ORAL_TABLET | Freq: Three times a day (TID) | ORAL | Status: DC
  Administered 2016-09-27 – 2016-09-29 (×8): 1 g via ORAL
  Filled 2016-09-27 (×7): qty 1

## 2016-09-27 MED ORDER — FENTANYL CITRATE (PF) 100 MCG/2ML IJ SOLN
50.0000 ug | Freq: Once | INTRAMUSCULAR | Status: AC
  Administered 2016-09-27: 50 ug via INTRAVENOUS

## 2016-09-27 MED ORDER — SENNOSIDES-DOCUSATE SODIUM 8.6-50 MG PO TABS: 1.0000 | ORAL_TABLET | Freq: Every evening | ORAL | Status: DC | PRN

## 2016-09-27 MED ORDER — ONDANSETRON HCL 4 MG PO TABS: 4.0000 mg | ORAL_TABLET | Freq: Four times a day (QID) | ORAL | Status: DC | PRN

## 2016-09-27 MED ORDER — ALPRAZOLAM 0.5 MG PO TABS: 0.5000 mg | ORAL_TABLET | Freq: Every evening | ORAL | Status: DC

## 2016-09-27 MED ORDER — BENZONATATE 100 MG PO CAPS: 100.0000 mg | ORAL_CAPSULE | Freq: Three times a day (TID) | ORAL | Status: DC | PRN

## 2016-09-27 MED ORDER — LINAGLIPTIN 5 MG PO TABS
5.0000 mg | ORAL_TABLET | Freq: Every day | ORAL | Status: DC
  Administered 2016-09-27 – 2016-09-29 (×3): 5 mg via ORAL
  Filled 2016-09-27 (×3): qty 1

## 2016-09-27 MED ORDER — VENLAFAXINE HCL ER 75 MG PO CP24
150.0000 mg | ORAL_CAPSULE | Freq: Every day | ORAL | Status: DC
  Administered 2016-09-27 – 2016-09-29 (×3): 150 mg via ORAL
  Filled 2016-09-27 (×3): qty 2

## 2016-09-27 MED ORDER — BACLOFEN 10 MG PO TABS
10.0000 mg | ORAL_TABLET | Freq: Three times a day (TID) | ORAL | Status: DC
  Administered 2016-09-27 – 2016-09-29 (×7): 10 mg via ORAL
  Filled 2016-09-27 (×7): qty 1

## 2016-09-27 MED ORDER — SODIUM CHLORIDE 0.9 % IV SOLN
Freq: Once | INTRAVENOUS | Status: AC
  Administered 2016-09-27: via INTRAVENOUS

## 2016-09-27 MED ORDER — MIRTAZAPINE 15 MG PO TABS
45.0000 mg | ORAL_TABLET | Freq: Every day | ORAL | Status: DC
  Administered 2016-09-27 – 2016-09-28 (×2): 45 mg via ORAL
  Filled 2016-09-27 (×2): qty 3

## 2016-09-27 MED ORDER — PANTOPRAZOLE SODIUM 40 MG PO TBEC: 40.0000 mg | DELAYED_RELEASE_TABLET | Freq: Every day | ORAL | Status: DC

## 2016-09-27 MED ORDER — OXYCODONE HCL 5 MG PO TABS
5.0000 mg | ORAL_TABLET | Freq: Four times a day (QID) | ORAL | Status: DC | PRN
  Administered 2016-09-27 – 2016-09-29 (×8): 5 mg via ORAL
  Filled 2016-09-27 (×9): qty 1

## 2016-09-27 MED ORDER — OXYBUTYNIN CHLORIDE 5 MG PO TABS
5.0000 mg | ORAL_TABLET | Freq: Two times a day (BID) | ORAL | Status: DC
  Administered 2016-09-27 – 2016-09-29 (×5): 5 mg via ORAL
  Filled 2016-09-27 (×6): qty 1

## 2016-09-27 MED ORDER — SODIUM CHLORIDE 0.9 % IV SOLN
INTRAVENOUS | Status: DC
  Administered 2016-09-27 – 2016-09-28 (×5): via INTRAVENOUS

## 2016-09-27 MED ORDER — FLUTICASONE PROPIONATE 50 MCG/ACT NA SUSP
1.0000 | Freq: Two times a day (BID) | NASAL | Status: DC
  Administered 2016-09-27 – 2016-09-28 (×4): 1 via NASAL
  Filled 2016-09-27: qty 16

## 2016-09-27 MED ORDER — ALBUTEROL SULFATE (2.5 MG/3ML) 0.083% IN NEBU: 2.5000 mg | INHALATION_SOLUTION | Freq: Four times a day (QID) | RESPIRATORY_TRACT | Status: DC | PRN

## 2016-09-27 MED ORDER — SODIUM CHLORIDE 0.9 % IV SOLN
Freq: Once | INTRAVENOUS | Status: AC
  Administered 2016-09-27: 04:00:00 via INTRAVENOUS

## 2016-09-27 MED ORDER — ZOLPIDEM TARTRATE 5 MG PO TABS
10.0000 mg | ORAL_TABLET | Freq: Every evening | ORAL | Status: DC | PRN
  Administered 2016-09-27 – 2016-09-28 (×2): 10 mg via ORAL
  Filled 2016-09-27 (×2): qty 2

## 2016-09-27 MED ORDER — ACETAMINOPHEN 325 MG PO TABS: 650.0000 mg | ORAL_TABLET | Freq: Four times a day (QID) | ORAL | Status: DC | PRN

## 2016-09-27 MED ORDER — SODIUM CHLORIDE 0.9% FLUSH
10.0000 mL | Freq: Two times a day (BID) | INTRAVENOUS | Status: DC
  Administered 2016-09-27 – 2016-09-29 (×5): 10 mL via INTRAVENOUS

## 2016-09-27 MED ORDER — ALPRAZOLAM 1 MG PO TABS
1.0000 mg | ORAL_TABLET | Freq: Every morning | ORAL | Status: DC
  Administered 2016-09-27 – 2016-09-29 (×3): 1 mg via ORAL
  Filled 2016-09-27 (×2): qty 2
  Filled 2016-09-27: qty 1

## 2016-09-27 MED ORDER — SIMVASTATIN 40 MG PO TABS
40.0000 mg | ORAL_TABLET | Freq: Every day | ORAL | Status: DC
  Administered 2016-09-27 – 2016-09-28 (×2): 40 mg via ORAL
  Filled 2016-09-27 (×3): qty 1

## 2016-09-27 MED ORDER — FENTANYL CITRATE (PF) 100 MCG/2ML IJ SOLN
INTRAMUSCULAR | Status: AC
  Administered 2016-09-27: 50 ug via INTRAVENOUS
  Filled 2016-09-27: qty 2

## 2016-09-27 NOTE — ED Notes (Signed)
Patient is resting comfortably at this time with no signs of distress present. Will continue to monitor.

## 2016-09-27 NOTE — ED Provider Notes (Signed)
Patient received in signout from Dr. Rip Harbour. Patient complaining of epigastric pain having episodes of lightheadedness and near syncope. Roughly around 11:30 the patient became hypotensive and acutely ill-appearing.  She also became acutely hypoxic. Due to of poor IV access after multiple attempts at peripheral and I ultrasound guided IV access a central line was placed emergently for volume resuscitation.  Verbal consent was obtained.  Patient taken emergently to CT scan for evaluation.   .Central Line Date/Time: 09/27/2016 1:17 AM Performed by: Merlyn Lot Authorized by: Merlyn Lot   Consent:    Consent obtained:  Verbal and emergent situation   Consent given by:  Patient   Alternatives discussed:  No treatment Pre-procedure details:    Hand hygiene: Hand hygiene performed prior to insertion     Skin preparation:  2% chlorhexidine Anesthesia (see MAR for exact dosages):    Anesthesia method:  Local infiltration   Local anesthetic:  Lidocaine 1% WITH epi Procedure details:    Location:  R internal jugular   Patient position:  Flat   Procedural supplies:  Triple lumen   Catheter size:  8 Fr   Landmarks identified: yes     Ultrasound guidance: yes     Sterile ultrasound techniques: Sterile gel and sterile probe covers were used     Number of attempts:  1   Successful placement: yes   Post-procedure details:    Post-procedure:  Dressing applied and line sutured   Assessment:  Blood return through all ports, no pneumothorax on x-ray, placement verified by x-ray and free fluid flow   Patient tolerance of procedure:  Tolerated well, no immediate complications    ----------------------------------------- 2:13 AM on 09/27/2016 -----------------------------------------  Patient with improvement signs. CT imaging unremarkable. Repeat abdominal exam with reassuring exam. Vital signs normalizing after fluid resuscitation. Her guaiac exam is negative. Etiology of hypotensive  episodes is uncertain at this time. 2 EKGs and troponin showed no evidence of acute ischemia. Possible vasovagal versus dehydration.  Critical Care:  Yes CRITICAL CARE Performed by: Merlyn Lot   Total critical care time: 50 minutes  Critical care time was exclusive of separately billable procedures and treating other patients.  Critical care was necessary to treat or prevent imminent or life-threatening deterioration.  Critical care was time spent personally by me on the following activities: development of treatment plan with patient and/or surrogate as well as nursing, discussions with consultants, evaluation of patient's response to treatment, examination of patient, obtaining history from patient or surrogate, ordering and performing treatments and interventions, ordering and review of laboratory studies, ordering and review of radiographic studies, pulse oximetry and re-evaluation of patient's condition.     Merlyn Lot, MD 09/27/16 [phone]

## 2016-09-27 NOTE — H&P (Signed)
[facility] [place] NAME: Lindsey [name]    MR#:  [id number]  DATE OF BIRTH:  1961-04-08  DATE OF ADMISSION:  09/26/2016  PRIMARY CARE PHYSICIAN: Volanda Napoleon, MD   REQUESTING/REFERRING PHYSICIAN:   CHIEF COMPLAINT:   Chief Complaint  Patient presents with  . Loss of Consciousness    HISTORY OF PRESENT ILLNESS: Lindsey [name]  is a 55 y.o. female with a known history of Peptic ulcer disease, COPD, GERD, hyperlipidemia, hypertension, sleep apnea, CVA presented to the emergency room because she passed out yesterday evening at home. No evidence of any seizure. Patient was evaluated in the emergency room her EKG was within normal sinus rhythm with no ST segment changes. She will also complained of abdominal discomfort around the umbilical area and epigastric area for which she was worked up with a CT abdomen which showed no acute intra-abdominal pathology. Patient became hypotensive in the emergency room and she was given IV fluids and was also tachycardic in the emergency room. Her stool guaiac was negative. Patient had poor venous access, central line insertion was done in the emergency room. Hospitalist service was consulted for further care of the patient. 2 sets of troponin are negative. No complaints of any chest pain. No complaints of shortness of breath, orthopnea and proximal nocturnal dyspnea. Has nausea but no evidence of vomiting.  PAST MEDICAL HISTORY:   Past Medical History:  Diagnosis Date  . Allergy   . COPD (chronic obstructive pulmonary disease) ([place])   . Diabetes mellitus without complication ([place])   . GERD (gastroesophageal reflux disease)   . Heart attack   . Hyperlipidemia   . Hypertension   . PUD (peptic ulcer disease)   . Sleep apnea   . Stroke [facility])     PAST SURGICAL HISTORY: Past Surgical History:  Procedure Laterality Date  . ABDOMINAL HYSTERECTOMY    . back surgery    . broken wrist    .  GALLBLADDER SURGERY    . GASTRIC BYPASS    . HERNIA REPAIR    . REPLACEMENT TOTAL KNEE    . ROTATOR CUFF REPAIR      SOCIAL HISTORY:  Social History  Substance Use Topics  . Smoking status: Never Smoker  . Smokeless tobacco: Never Used  . Alcohol use No    FAMILY HISTORY:  Family History  Problem Relation Age of Onset  . Hypertension Father   . COPD Father   . Heart disease Father     DRUG ALLERGIES:  Allergies  Allergen Reactions  . Morphine Hives  . Iodinated Diagnostic Agents Hives  . Aspirin Hives    Noted on MD progress notes and discussed with MD 09/02/15  . Etodolac Hives  . Ibuprofen Hives  . Shellfish Allergy Hives    REVIEW OF SYSTEMS:   CONSTITUTIONAL: No fever, has weakness.  EYES: No blurred or double vision.  EARS, NOSE, AND THROAT: No tinnitus or ear pain.  RESPIRATORY: No cough, shortness of breath, wheezing or hemoptysis.  CARDIOVASCULAR: No chest pain, orthopnea, edema.  GASTROINTESTINAL: Has nausea and abdominal discomfort No vomiting, diarrhea   GENITOURINARY: No dysuria, hematuria.  ENDOCRINE: No polyuria, nocturia,  HEMATOLOGY: No anemia, easy bruising or bleeding SKIN: No rash or lesion. MUSCULOSKELETAL: No joint pain or arthritis.   NEUROLOGIC: No tingling, numbness, weakness.  Passed out at home PSYCHIATRY: No anxiety or depression.   MEDICATIONS AT HOME:  Prior to Admission medications   Medication Sig  Start Date End Date Taking? Authorizing Provider  albuterol (PROVENTIL HFA;VENTOLIN HFA) 108 (90 BASE) MCG/ACT inhaler Inhale 2 puffs into the lungs every 4 (four) hours as needed for wheezing or shortness of breath.   Yes Historical Provider, MD  albuterol (PROVENTIL) (2.5 MG/3ML) 0.083% nebulizer solution Take 2.5 mg by nebulization every 6 (six) hours as needed for wheezing or shortness of breath.   Yes Historical Provider, MD  ALPRAZolam Duanne Moron) 1 MG tablet Take 1-1.5 mg by mouth 2 (two) times daily. Pt takes one tablet in the  morning and one and one-half tablet at night.   Yes Historical Provider, MD  baclofen (LIORESAL) 10 MG tablet Take 10 mg by mouth 3 (three) times daily.   Yes Historical Provider, MD  benzonatate (TESSALON) 100 MG capsule Take 100 mg by mouth 3 (three) times daily as needed for cough.   Yes Historical Provider, MD  Calcium Lactate 648 MG TABS Take 648 mg by mouth 2 (two) times daily.   Yes Historical Provider, MD  cloNIDine (CATAPRES) 0.1 MG tablet Take 0.1 mg by mouth at bedtime.   Yes Historical Provider, MD  clopidogrel (PLAVIX) 75 MG tablet Take 1 tablet (75 mg total) by mouth daily. 09/01/15  Yes Sital Mody, MD  desvenlafaxine (PRISTIQ) 100 MG 24 hr tablet Take 100 mg by mouth daily. Pt takes 100 mg tab and 50 mg tab for total of 150 mg   Yes Historical Provider, MD  desvenlafaxine (PRISTIQ) 50 MG 24 hr tablet Take 50 mg by mouth daily. Pt takes 100 mg tab and 50 mg tab for total of 150 mg   Yes Historical Provider, MD  esomeprazole (NEXIUM) 40 MG capsule Take 40 mg by mouth 2 (two) times daily.   Yes Historical Provider, MD  fentaNYL (DURAGESIC - DOSED MCG/HR) 50 MCG/HR Place 50 mcg onto the skin every 3 (three) days.   Yes Historical Provider, MD  fexofenadine (ALLEGRA) 60 MG tablet Take 60 mg by mouth 2 (two) times daily.   Yes Historical Provider, MD  fluticasone (FLONASE) 50 MCG/ACT nasal spray Place 1 spray into both nostrils 2 (two) times daily.   Yes Historical Provider, MD  folic acid (FOLVITE) 1 MG tablet Take 1 mg by mouth daily.   Yes Historical Provider, MD  hydrOXYzine (ATARAX/VISTARIL) 25 MG tablet Take 25 mg by mouth 3 (three) times daily.   Yes Historical Provider, MD  l-methylfolate-B6-B12 (METANX) 3-35-2 MG TABS tablet Take 1 tablet by mouth 2 (two) times daily.   Yes Historical Provider, MD  metoprolol succinate (TOPROL-XL) 100 MG 24 hr tablet Take 100 mg by mouth daily.   Yes Historical Provider, MD  olmesartan-hydrochlorothiazide (BENICAR HCT) 40-12.5 MG tablet Take 1 tablet  by mouth daily.   Yes Historical Provider, MD  ONGLYZA 5 MG TABS tablet Take 1 tablet by mouth daily. 08/28/16  Yes Historical Provider, MD  oxybutynin (DITROPAN) 5 MG tablet Take 5 mg by mouth 2 (two) times daily.   Yes Historical Provider, MD  oxyCODONE (OXY IR/ROXICODONE) 5 MG immediate release tablet Take 5 mg by mouth every 6 (six) hours as needed for severe pain.   Yes Historical Provider, MD  potassium chloride (K-DUR) 10 MEQ tablet Take 10 mEq by mouth daily.   Yes Historical Provider, MD  simvastatin (ZOCOR) 40 MG tablet Take 1 tablet by mouth daily. 09/24/16  Yes Historical Provider, MD  zolpidem (AMBIEN) 10 MG tablet Take 10 mg by mouth at bedtime as needed for sleep.   Yes  Historical Provider, MD  mirtazapine (REMERON) 45 MG tablet Take 45 mg by mouth at bedtime.    Historical Provider, MD      PHYSICAL EXAMINATION:   VITAL SIGNS: Blood pressure 119/72, pulse 88, temperature 98.3 F (36.8 C), temperature source Oral, resp. rate 19, height 5\' 7"  (1.702 m), weight 134.7 kg (297 lb), SpO2 97 %.  GENERAL:  55 y.o.-year-old obese patient lying in the bed with no acute distress.  EYES: Pupils equal, round, reactive to light and accommodation. No scleral icterus. Extraocular muscles intact.  HEENT: Head atraumatic, normocephalic. Oropharynx dry and nasopharynx clear.  NECK:  Supple, no jugular venous distention. No thyroid enlargement, no tenderness.  LUNGS: Normal breath sounds bilaterally, no wheezing, rales,rhonchi or crepitation. No use of accessory muscles of respiration.  CARDIOVASCULAR: S1, S2 normal. No murmurs, rubs, or gallops.  ABDOMEN: Soft, tenderness epigastrium and around umbilicus, nondistended. Bowel sounds present. No organomegaly or mass.  EXTREMITIES: No pedal edema, cyanosis, or clubbing.  NEUROLOGIC: Cranial nerves II through XII are intact. Muscle strength 5/5 in all extremities. Sensation intact. Gait not checked.  PSYCHIATRIC: The patient is alert and oriented x  3.  SKIN: No obvious rash, lesion, or ulcer.   LABORATORY PANEL:   CBC  Recent Labs Lab 09/26/16 1930  WBC 9.0  HGB 10.3*  HCT 33.6*  PLT 404  MCV 75.3*  MCH 23.2*  MCHC 30.7*  RDW 20.0*  LYMPHSABS 1.8  MONOABS 0.7  EOSABS 0.1  BASOSABS 0.0   ------------------------------------------------------------------------------------------------------------------  Chemistries   Recent Labs Lab 09/26/16 1930  NA 137  K 3.7  CL 107  CO2 23  GLUCOSE 143*  BUN 30*  CREATININE 1.46*  CALCIUM 8.5*  AST 23  ALT 24  ALKPHOS 124  BILITOT <0.1*   ------------------------------------------------------------------------------------------------------------------ estimated creatinine clearance is 60.9 mL/min (by C-G formula based on SCr of 1.46 mg/dL (H)). ------------------------------------------------------------------------------------------------------------------ No results for input(s): TSH, T4TOTAL, T3FREE, THYROIDAB in the last 72 hours.  Invalid input(s): FREET3   Coagulation profile No results for input(s): INR, PROTIME in the last 168 hours. ------------------------------------------------------------------------------------------------------------------- No results for input(s): DDIMER in the last 72 hours. -------------------------------------------------------------------------------------------------------------------  Cardiac Enzymes  Recent Labs Lab 09/26/16 1930 09/26/16 2210  TROPONINI <0.03 <0.03   ------------------------------------------------------------------------------------------------------------------ Invalid input(s): POCBNP  ---------------------------------------------------------------------------------------------------------------  Urinalysis    Component Value Date/Time   COLORURINE YELLOW (A) 09/27/2016 0330   APPEARANCEUR CLEAR (A) 09/27/2016 0330   APPEARANCEUR CLEAR 06/29/2014 0028   LABSPEC 1.017 09/27/2016 0330    LABSPEC 1.025 06/29/2014 0028   PHURINE 5.0 09/27/2016 0330   GLUCOSEU NEGATIVE 09/27/2016 0330   GLUCOSEU see comment 06/29/2014 0028   HGBUR NEGATIVE 09/27/2016 0330   BILIRUBINUR NEGATIVE 09/27/2016 0330   BILIRUBINUR see comment 06/29/2014 0028   KETONESUR NEGATIVE 09/27/2016 0330   PROTEINUR NEGATIVE 09/27/2016 0330   NITRITE NEGATIVE 09/27/2016 0330   LEUKOCYTESUR NEGATIVE 09/27/2016 0330   LEUKOCYTESUR see comment 06/29/2014 0028     RADIOLOGY: Ct Abdomen Pelvis Wo Contrast  Result Date: 09/27/2016 CLINICAL DATA:  Syncope. EXAM: CT ABDOMEN AND PELVIS WITHOUT CONTRAST TECHNIQUE: Multidetector CT imaging of the abdomen and pelvis was performed following the standard protocol without IV contrast. COMPARISON:  09/15/2012 FINDINGS: Lower chest: No acute abnormality. Hepatobiliary: No focal liver abnormality is seen. Status post cholecystectomy. No biliary dilatation. Pancreas: Unremarkable. No pancreatic ductal dilatation or surrounding inflammatory changes. Spleen: Normal in size without focal abnormality. Adrenals/Urinary Tract: Adrenal glands are unremarkable. Kidneys are normal, without renal calculi, focal lesion, or hydronephrosis. Bladder  is unremarkable. Stomach/Bowel: Hiatal hernia. Prior gastric surgery. Small bowel, appendix and colon are unremarkable. No acute inflammatory changes. Vascular/Lymphatic: No significant vascular findings are present. No enlarged abdominal or pelvic lymph nodes. Reproductive: Status post hysterectomy. No adnexal masses. Other: No abdominal wall hernia or abnormality. No abdominopelvic ascites. Musculoskeletal: No acute or significant osseous findings. Prior posterior decompression with instrumented fusion at L5-S1. IMPRESSION: No acute findings.  Hiatal hernia. Electronically Signed   By: Andreas Newport M.D.   On: 09/27/2016 01:12   Dg Chest 2 View  Result Date: 09/26/2016 CLINICAL DATA:  Initial evaluation for acute syncope. EXAM: CHEST  2 VIEW  COMPARISON:  Prior radiograph from 06/29/2014. FINDINGS: Mild cardiomegaly is stable. Mediastinal silhouette within normal limits. Lungs normally inflated. No focal infiltrate, pulmonary edema, or pleural effusion. No pneumothorax. No acute osseous abnormality.  Spinal stimulator device noted. IMPRESSION: No active cardiopulmonary disease. Electronically Signed   By: Jeannine Boga M.D.   On: 09/26/2016 19:18   Dg Chest Port 1 View  Result Date: 09/27/2016 CLINICAL DATA:  Syncope today. EXAM: PORTABLE CHEST 1 VIEW COMPARISON:  09/26/2016 at 18:56 FINDINGS: There is a new right jugular central line extending to the low SVC. There is no pneumothorax. Unchanged mild right hemidiaphragm elevation. Normal pulmonary vasculature. IMPRESSION: New right jugular central line.  No pneumothorax. Electronically Signed   By: Andreas Newport M.D.   On: 09/27/2016 00:36    EKG: Orders placed or performed during the hospital encounter of 09/26/16  . ED EKG  . ED EKG  . Repeat EKG  . Repeat EKG  . EKG 12-Lead  . EKG 12-Lead    IMPRESSION AND PLAN: 55 year old obese female patient with history of peptic ulcer disease, diabetes mellitus hyperlipidemia GERD, hypertension presented to the emergency room after she passed out. Patient has pain in the epigastric region. Admitting diagnosis 1. Syncope 2. Hypotension 3. Acute gastritis 4. Peptic ulcer disease 5. Dehydration Treatment plan Admit patient to medical floor with telemetry IV fluid hydration Hold blood pressure medications Proton pump inhibitor for gastritis Cycle troponin to rule out ischemia Carotid ultrasound to rule out obstruction Supportive care.  All the records are reviewed and case discussed with ED provider. Management plans discussed with the patient, family and they are in agreement.  CODE STATUS:FULL Code Status History    Date Active Date Inactive Code Status Order ID Comments User Context   08/31/2015  5:48 PM 09/02/2015   7:52 PM Full Code [id number]  Nicholes Mango, MD ED       TOTAL TIME TAKING CARE OF THIS PATIENT: 53 minutes.    Saundra Shelling M.D on 09/27/2016 at 3:59 AM  Between 7am to 6pm - Pager - [phone]  After 6pm go to [email or web address] - password [email or web address] [facility]  Office  [phone]  CC: Primary care physician; Volanda Napoleon, MD

## 2016-09-27 NOTE — ED Notes (Signed)
Admitting MD notified at this time that patient is complaining of stomach pain since drinking Apple Cider Vinegar yesterday and reports that she cannot take acetaminophen because it causes an upset stomach.

## 2016-09-27 NOTE — Progress Notes (Signed)
[facility] [place] NAME: Lindsey [name]    MR#:  [id number]  DATE OF BIRTH:  June 13, 1961  SUBJECTIVE:  CHIEF COMPLAINT:   Chief Complaint  Patient presents with  . Loss of Consciousness  Patient is 55 year old African-American female with past medical history significant for history of COPD, gastroesophageal reflux disease, hyperlipidemia, hypertension, obstructive sleep apnea, stroke, Peptic ulcer disease diagnosed about 2012, status post endoscopy by Dr. Vira Agar in the past, who has been having periumbilical abdominal pain for a while , comes with worsening. Umbilical pain after she started using apple cider vinegar as dietary aid. She was noted to be somewhat dehydrated with elevated creatinine, hypotensive, tachycardic, she was given IV fluids and improved. She complains of periumbilical abdominal pain and back pain which is chronic. Rectal exam was done in emergency room, guaiac negative   Review of Systems  Gastrointestinal: Positive for abdominal pain.    VITAL SIGNS: Blood pressure 103/61, pulse 84, temperature 98.7 F (37.1 C), temperature source Oral, resp. rate 14, height 5\' 7"  (1.702 m), weight (!) 147.3 kg (324 lb 12.8 oz), SpO2 97 %.  PHYSICAL EXAMINATION:   GENERAL:  55 y.o.-year-old obese African-American patient lying in the bed with no acute distress.  EYES: Pupils equal, round, reactive to light and accommodation. No scleral icterus. Extraocular muscles intact.  HEENT: Head atraumatic, normocephalic. Oropharynx and nasopharynx clear.  NECK:  Supple, no jugular venous distention. No thyroid enlargement, no tenderness.  LUNGS: Normal breath sounds bilaterally, no wheezing, rales,rhonchi or crepitation. No use of accessory muscles of respiration.  CARDIOVASCULAR: S1, S2 normal. No murmurs, rubs, or gallops.  ABDOMEN: Soft, tender in periumbilical area, some voluntary guarding, no rebound, nondistended. Bowel sounds  present. No organomegaly or mass.  EXTREMITIES: No pedal edema, cyanosis, or clubbing.  NEUROLOGIC: Cranial nerves II through XII are intact. Muscle strength 5/5 in all extremities. Sensation intact. Gait not checked.  PSYCHIATRIC: The patient is alert and oriented x 3.  SKIN: No obvious rash, lesion, or ulcer.   ORDERS/RESULTS REVIEWED:   CBC  Recent Labs Lab 09/26/16 1930 09/27/16 1205  WBC 9.0 13.7*  HGB 10.3* 9.2*  HCT 33.6* 28.9*  PLT 404 361  MCV 75.3* 74.1*  MCH 23.2* 23.6*  MCHC 30.7* 31.8*  RDW 20.0* 19.8*  LYMPHSABS 1.8  --   MONOABS 0.7  --   EOSABS 0.1  --   BASOSABS 0.0  --    ------------------------------------------------------------------------------------------------------------------  Chemistries   Recent Labs Lab 09/26/16 1930 09/27/16 1205  NA 137 138  K 3.7 3.9  CL 107 110  CO2 23 24  GLUCOSE 143* 117*  BUN 30* 26*  CREATININE 1.46* 1.14*  CALCIUM 8.5* 8.3*  AST 23  --   ALT 24  --   ALKPHOS 124  --   BILITOT <0.1*  --    ------------------------------------------------------------------------------------------------------------------ estimated creatinine clearance is 82.4 mL/min (by C-G formula based on SCr of 1.14 mg/dL (H)). ------------------------------------------------------------------------------------------------------------------ No results for input(s): TSH, T4TOTAL, T3FREE, THYROIDAB in the last 72 hours.  Invalid input(s): FREET3  Cardiac Enzymes  Recent Labs Lab 09/26/16 1930 09/26/16 2210 09/27/16 1205  TROPONINI <0.03 <0.03 <0.03   ------------------------------------------------------------------------------------------------------------------ Invalid input(s): POCBNP ---------------------------------------------------------------------------------------------------------------  RADIOLOGY: Ct Abdomen Pelvis Wo Contrast  Result Date: 09/27/2016 CLINICAL DATA:  Syncope. EXAM: CT ABDOMEN AND PELVIS WITHOUT  CONTRAST TECHNIQUE: Multidetector CT imaging of the abdomen and pelvis was performed following the standard protocol without IV contrast. COMPARISON:  09/15/2012 FINDINGS: Lower  chest: No acute abnormality. Hepatobiliary: No focal liver abnormality is seen. Status post cholecystectomy. No biliary dilatation. Pancreas: Unremarkable. No pancreatic ductal dilatation or surrounding inflammatory changes. Spleen: Normal in size without focal abnormality. Adrenals/Urinary Tract: Adrenal glands are unremarkable. Kidneys are normal, without renal calculi, focal lesion, or hydronephrosis. Bladder is unremarkable. Stomach/Bowel: Hiatal hernia. Prior gastric surgery. Small bowel, appendix and colon are unremarkable. No acute inflammatory changes. Vascular/Lymphatic: No significant vascular findings are present. No enlarged abdominal or pelvic lymph nodes. Reproductive: Status post hysterectomy. No adnexal masses. Other: No abdominal wall hernia or abnormality. No abdominopelvic ascites. Musculoskeletal: No acute or significant osseous findings. Prior posterior decompression with instrumented fusion at L5-S1. IMPRESSION: No acute findings.  Hiatal hernia. Electronically Signed   By: Andreas Newport M.D.   On: 09/27/2016 01:12   Dg Chest 2 View  Result Date: 09/26/2016 CLINICAL DATA:  Initial evaluation for acute syncope. EXAM: CHEST  2 VIEW COMPARISON:  Prior radiograph from 06/29/2014. FINDINGS: Mild cardiomegaly is stable. Mediastinal silhouette within normal limits. Lungs normally inflated. No focal infiltrate, pulmonary edema, or pleural effusion. No pneumothorax. No acute osseous abnormality.  Spinal stimulator device noted. IMPRESSION: No active cardiopulmonary disease. Electronically Signed   By: Jeannine Boga M.D.   On: 09/26/2016 19:18   US Carotid Bilateral  Result Date: 09/27/2016 CLINICAL DATA:  Syncope EXAM: BILATERAL CAROTID DUPLEX ULTRASOUND TECHNIQUE: Pearline Cables scale imaging, color Doppler and duplex  ultrasound were performed of bilateral carotid and vertebral arteries in the neck. COMPARISON:  None. FINDINGS: Criteria: Quantification of carotid stenosis is based on velocity parameters that correlate the residual internal carotid diameter with NASCET-based stenosis levels, using the diameter of the distal internal carotid lumen as the denominator for stenosis measurement. The following velocity measurements were obtained: RIGHT ICA: The internal carotid could not be measured because a dressing over the right neck prevented direct imaging of the vessel. CCA:  83 cm/sec SYSTOLIC ICA/CCA RATIO:  Not applicable DIASTOLIC ICA/CCA RATIO:  Not applicable ECA: The external carotid could not be measured because a dressing over the right neck prevented direct imaging of the vessel. LEFT ICA:  64 cm/sec CCA:  96 cm/sec SYSTOLIC ICA/CCA RATIO:  0.7 DIASTOLIC ICA/CCA RATIO:  1.0 ECA:  67 cm/sec RIGHT CAROTID ARTERY: A dressing over the right side of the neck prevented imaging of the right internal and external carotid arteries. Imaging of the bulb was achieved and there is little if any plaque in the bulb. There is little if any plaque along the common carotid. RIGHT VERTEBRAL ARTERY:  Antegrade LEFT CAROTID ARTERY:  Minimal soft plaque in the bulb. LEFT VERTEBRAL ARTERY:  Antegrade IMPRESSION: Limited examination. The right internal carotid artery could not be visualized and cannot be adequately evaluated. The right bulb is grossly clear. Less than 50% stenosis in the left internal carotid artery. Bilateral antegrade vertebral artery flow. Electronically Signed   By: Marybelle Killings M.D.   On: 09/27/2016 11:46   Dg Chest Port 1 View  Result Date: 09/27/2016 CLINICAL DATA:  Syncope today. EXAM: PORTABLE CHEST 1 VIEW COMPARISON:  09/26/2016 at 18:56 FINDINGS: There is a new right jugular central line extending to the low SVC. There is no pneumothorax. Unchanged mild right hemidiaphragm elevation. Normal pulmonary  vasculature. IMPRESSION: New right jugular central line.  No pneumothorax. Electronically Signed   By: Andreas Newport M.D.   On: 09/27/2016 00:36    EKG:  Orders placed or performed during the hospital encounter of 09/26/16  . ED EKG  .  ED EKG  . Repeat EKG  . Repeat EKG  . EKG 12-Lead  . EKG 12-Lead    ASSESSMENT AND PLAN:  Principal Problem:   Syncope #1 syncope, suspected due to dehydration, poor by mouth intake, get orthostatic vital signs, carotid ultrasound is pending as well as echocardiogram #2. Hypotension, improved with IV fluid administration #3. Acute renal insufficiency, improved, urinalysis was unremarkable, get renal ultrasound #4anemia, rectal exam was in the emergency room, guaiac negative, anemia has worsened with dehydration, follow closely #5. Leukocytosis, unclear etiology, could be stress related #6. Hyperglycemia, get hemoglobin A1c to rule out diabetes #7. periumbilical abdominal pain worsened with vinegar use, concerning for peptic ulcer disease exacerbation, continue Protonix, Carafate, get gastroenterologist involved for possible EGD  Management plans discussed with the patient, family and they are in agreement.   DRUG ALLERGIES:  Allergies  Allergen Reactions  . Morphine Hives  . Iodinated Diagnostic Agents Hives  . Aspirin Hives    Noted on MD progress notes and discussed with MD 09/02/15  . Etodolac Hives  . Ibuprofen Hives  . Shellfish Allergy Hives  . Tylenol [Acetaminophen]     CODE STATUS:     Code Status Orders        Start     Ordered   09/27/16 1129  Full code  Continuous     09/27/16 1128    Code Status History    Date Active Date Inactive Code Status Order ID Comments User Context   08/31/2015  5:48 PM 09/02/2015  7:52 PM Full Code [id number]  Nicholes Mango, MD ED      TOTAL TIME TAKING CARE OF THIS PATIENT: 40 minutes.    Theodoro Grist M.D on 09/27/2016 at 12:57 PM  Between 7am to 6pm - Pager - [phone]  After  6pm go to [email or web address] - password [email or web address] [facility]  Office  [phone]  CC: Primary care physician; Volanda Napoleon, MD

## 2016-09-27 NOTE — Progress Notes (Signed)
Anticoagulation monitoring(Lovenox):  55 yo  Female ordered Lovenox 40 mg Q24h  Filed Weights   09/26/16 1808 09/27/16 1123  Weight: 297 lb (134.7 kg) (!) 324 lb 12.8 oz (147.3 kg)   Body mass index is 50.87 kg/m.    Lab Results  Component Value Date   CREATININE 1.46 (H) 09/26/2016   CREATININE 1.56 (H) 09/01/2015   CREATININE 1.54 (H) 08/31/2015   Estimated Creatinine Clearance: 64.4 mL/min (by C-G formula based on SCr of 1.46 mg/dL (H)). Hemoglobin & Hematocrit     Component Value Date/Time   HGB 9.2 (L) 09/27/2016 1205   HGB 9.5 (L) 06/30/2014 0539   HCT 28.9 (L) 09/27/2016 1205   HCT 30.4 (L) 06/30/2014 0539     Per Protocol for Patient with estCrcl >30 ml/min and BMI > 40, will transition to Lovenox 40 mg Q12h.

## 2016-09-27 NOTE — Consult Note (Signed)
GI Inpatient Consult Note  Reason for Consult: Periumbilical pain   Attending Requesting Consult: Dr. Ether Griffins  History of Present Illness: Lindsey [name] is a 55 y.o. female with a known history of CVA, COPD, OSA, MI (on Plavix), HTN, HLD, DM II, seizures, and PUD admitted following an episode of syncope.  Patient reported passing out at home last evening.  Patient states she took a tablespoon of apple cider vinegar last night, as she heard recommended on TV.  About 3-5 minute later, she felt severe abdominal pain, which she localizes to the periumbilical area.  She began feeling diaphoretic and "passed out".  No head injury was sustained, but she notes mild left ankle pain today.  When she awoke, EMS was placing EKG leads on her chest.  She recalls "I was too sweaty for the stickers to stick".  She heard EMS state her BP and pulse were low.  No nausea or vomiting. She was transported to the [facility] ED for further evaluation.  Initial evaluation including ECG and CXR returned WNL.  Labs showed Hgb at baseline (appox 9-10), with Hct 33.6 and MCV 75.3.  CMP was notable for BUN 30, Cr 1.46, and albumin 3.2.  Troponins x2 also negative.  Patient also noted periumbilical pain, so a CT a/p was obtained; a hiatal hernia was incidentally noted, otherwise unremarkable.  Stool returned guaiac negative.  Notably, patient experienced a hypotensive episode lat night, appearing acutely ill and hypoxic.  IV access was difficulty to obtain, so a central line was placed for volume resuscitation.  Patient subsequently improved w/o recurrence of hypotension.  She was admitted for further evaluation of syncope.  Today, US carotid showed less than 50% stenosis in the left internal carotid artery, but the right internal carotid was incompletely evaluated due to poor visualization.  Patient underwent an EGD on 06/15/2014, which was notable for erosive gastropathy.  Notably, she is also s/p gastric bypass, and the pouch  appeared intact w/o ulceration.  She is also s/p CCY and abdominal hysterectomy.  Patient continues to take Nexium 40mg  BID for indigestion, heartburn, acid reflux, and chronic epigastric discomfort.  She cannot tolerate Ibuprofen due to an adverse reaction of rash.  She takes Oxycodone chronically for chronic back pain, and also has a spinal stimulator in place.  No significant NSAID use or EtOH use.  Also no FHx of CRC, colon polyps, or other GI malignancy.  Past Medical History:  Past Medical History:  Diagnosis Date  . Allergy   . COPD (chronic obstructive pulmonary disease) ([place])   . Diabetes mellitus without complication ([place])   . GERD (gastroesophageal reflux disease)   . Heart attack   . Hyperlipidemia   . Hypertension   . PUD (peptic ulcer disease)   . Sleep apnea   . Stroke [facility])     Problem List: Patient Active Problem List   Diagnosis Date Noted  . Syncope 09/27/2016  . Seizures ([place]) 09/02/2015  . 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 Disease of female genital organs 04/01/2013  . Difficult or painful urination 03/18/2013  . Urge incontinence 03/18/2013  . Urgency of micturation 03/18/2013    Past Surgical History: Past Surgical History:  Procedure Laterality Date  . ABDOMINAL HYSTERECTOMY    . back surgery    . broken wrist    . GALLBLADDER SURGERY    . GASTRIC BYPASS    . HERNIA REPAIR    . REPLACEMENT TOTAL KNEE    .  ROTATOR CUFF REPAIR      Allergies: Allergies  Allergen Reactions  . Morphine Hives  . Iodinated Diagnostic Agents Hives  . Aspirin Hives    Noted on MD progress notes and discussed with MD 09/02/15  . Etodolac Hives  . Ibuprofen Hives  . Shellfish Allergy Hives  . Tylenol [Acetaminophen]     Home Medications: Prescriptions Prior to Admission  Medication Sig Dispense Refill Last Dose  . albuterol (PROVENTIL HFA;VENTOLIN HFA) 108 (90 BASE) MCG/ACT inhaler Inhale 2 puffs into the lungs every 4  (four) hours as needed for wheezing or shortness of breath.   prn at prn  . albuterol (PROVENTIL) (2.5 MG/3ML) 0.083% nebulizer solution Take 2.5 mg by nebulization every 6 (six) hours as needed for wheezing or shortness of breath.   prn at prn  . ALPRAZolam (XANAX) 1 MG tablet Take 1-1.5 mg by mouth 2 (two) times daily. Pt takes one tablet in the morning and one and one-half tablet at night.   09/26/2016 at 0900  . baclofen (LIORESAL) 10 MG tablet Take 10 mg by mouth 3 (three) times daily.   09/26/2016 at 1400  . benzonatate (TESSALON) 100 MG capsule Take 100 mg by mouth 3 (three) times daily as needed for cough.   prn at prn  . Calcium Lactate 648 MG TABS Take 648 mg by mouth 2 (two) times daily.   09/26/2016 at 0900  . cloNIDine (CATAPRES) 0.1 MG tablet Take 0.1 mg by mouth at bedtime.   09/25/2016 at 2100  . clopidogrel (PLAVIX) 75 MG tablet Take 1 tablet (75 mg total) by mouth daily. 30 tablet 0 09/26/2016 at 0900  . desvenlafaxine (PRISTIQ) 100 MG 24 hr tablet Take 100 mg by mouth daily. Pt takes 100 mg tab and 50 mg tab for total of 150 mg   09/26/2016 at 0900  . desvenlafaxine (PRISTIQ) 50 MG 24 hr tablet Take 50 mg by mouth daily. Pt takes 100 mg tab and 50 mg tab for total of 150 mg   09/26/2016 at 0900  . esomeprazole (NEXIUM) 40 MG capsule Take 40 mg by mouth 2 (two) times daily.   09/26/2016 at 0900  . fentaNYL (DURAGESIC - DOSED MCG/HR) 50 MCG/HR Place 50 mcg onto the skin every 3 (three) days.   Past Week at Unknown time  . fexofenadine (ALLEGRA) 60 MG tablet Take 60 mg by mouth 2 (two) times daily.   09/26/2016 at 0900  . fluticasone (FLONASE) 50 MCG/ACT nasal spray Place 1 spray into both nostrils 2 (two) times daily.   09/26/2016 at 0900  . folic acid (FOLVITE) 1 MG tablet Take 1 mg by mouth daily.   09/26/2016 at 0900  . hydrOXYzine (ATARAX/VISTARIL) 25 MG tablet Take 25 mg by mouth 3 (three) times daily.   09/26/2016 at 1400  . l-methylfolate-B6-B12 (METANX) 3-35-2 MG TABS tablet  Take 1 tablet by mouth 2 (two) times daily.   09/26/2016 at 0900  . metoprolol succinate (TOPROL-XL) 100 MG 24 hr tablet Take 100 mg by mouth daily.   09/26/2016 at 0900  . olmesartan-hydrochlorothiazide (BENICAR HCT) 40-12.5 MG tablet Take 1 tablet by mouth daily.   09/26/2016 at 0900  . ONGLYZA 5 MG TABS tablet Take 1 tablet by mouth daily.   09/26/2016 at 0900  . oxybutynin (DITROPAN) 5 MG tablet Take 5 mg by mouth 2 (two) times daily.   09/26/2016 at 0900  . oxyCODONE (OXY IR/ROXICODONE) 5 MG immediate release tablet Take 5 mg by mouth every  6 (six) hours as needed for severe pain.   prn at prn  . potassium chloride (K-DUR) 10 MEQ tablet Take 10 mEq by mouth daily.   09/26/2016 at 0900  . simvastatin (ZOCOR) 40 MG tablet Take 1 tablet by mouth daily.   09/25/2016 at 2100  . zolpidem (AMBIEN) 10 MG tablet Take 10 mg by mouth at bedtime as needed for sleep.   09/25/2016 at 2100  . mirtazapine (REMERON) 45 MG tablet Take 45 mg by mouth at bedtime.   09/25/2016 at 2100   Home medication reconciliation was completed with the patient.   Scheduled Inpatient Medications:   . ALPRAZolam  0.5 mg Oral QPM  . ALPRAZolam  1 mg Oral q morning - 10a  . baclofen  10 mg Oral TID  . calcium carbonate  500 mg Oral BID  . clopidogrel  75 mg Oral Daily  . enoxaparin (LOVENOX) injection  40 mg Subcutaneous Q12H  . fluticasone  1 spray Each Nare BID  . folic acid  1 mg Oral Daily  . hydrOXYzine  25 mg Oral TID  . linagliptin  5 mg Oral Daily  . oxybutynin  5 mg Oral BID  . pantoprazole  40 mg Oral BID  . simvastatin  40 mg Oral q1800  . sodium chloride flush  10 mL Intravenous Q12H  . sodium chloride flush  3 mL Intravenous Q12H  . sucralfate  1 g Oral TID WC & HS  . venlafaxine XR  150 mg Oral Q breakfast    Continuous Inpatient Infusions:   . sodium chloride 75 mL/hr at 09/27/16 1215    PRN Inpatient Medications:  acetaminophen **OR** acetaminophen, albuterol, benzonatate, ondansetron **OR**  ondansetron (ZOFRAN) IV, oxyCODONE, senna-docusate, zolpidem  Family History: family history includes COPD in her father; Heart disease in her father; Hypertension in her father.    Social History:   reports that she has never smoked. She has never used smokeless tobacco. She reports that she does not drink alcohol or use drugs.   Review of Systems: Constitutional: Weight is stable.  Eyes: No changes in vision. ENT: No oral lesions, sore throat.  GI: see HPI.  Heme/Lymph: No easy bruising.  CV: No chest pain.  GU: No hematuria.  Integumentary: No rashes.  Neuro: No headaches.  Psych: No depression/anxiety.  Endocrine: No heat/cold intolerance.  Allergic/Immunologic: No urticaria.  Resp: No cough, SOB.  Musculoskeletal: No joint swelling.    Physical Examination: BP 103/61 (BP Location: Left Arm)   Pulse 84   Temp 98.7 F (37.1 C) (Oral)   Resp 14   Ht 5\' 7"  (1.702 m)   Wt (!) 147.3 kg (324 lb 12.8 oz)   SpO2 97%   BMI 50.87 kg/m  Gen: NAD, alert and oriented x 4 HEENT: PEERLA, EOMI, Neck: supple, no JVD or thyromegaly Chest: CTA bilaterally, no wheezes, crackles, or other adventitious sounds CV: RRR, no m/g/c/r Abd: soft, moderate periumbilical tenderness, mild epigastric tenderness, ND, +BS in all four quadrants; no HSM, guarding, ridigity, or rebound tenderness Ext: no edema, well perfused with 2+ pulses, Skin: no rash or lesions noted Lymph: no LAD  Data: Lab Results  Component Value Date   WBC 13.7 (H) 09/27/2016   HGB 9.2 (L) 09/27/2016   HCT 28.9 (L) 09/27/2016   MCV 74.1 (L) 09/27/2016   PLT 361 09/27/2016    Recent Labs Lab 09/26/16 1930 09/27/16 1205  HGB 10.3* 9.2*   Lab Results  Component Value Date  NA 138 09/27/2016   K 3.9 09/27/2016   CL 110 09/27/2016   CO2 24 09/27/2016   BUN 26 (H) 09/27/2016   CREATININE 1.14 (H) 09/27/2016   Lab Results  Component Value Date   ALT 24 09/26/2016   AST 23 09/26/2016   ALKPHOS 124 09/26/2016    BILITOT <0.1 (L) 09/26/2016   No results for input(s): APTT, INR, PTT in the last 168 hours.   Assessment/Plan: Ms. Haltiwanger is a 55 y.o. female with a known history of CVA, COPD, OSA, MI (on Plavix), HTN, HLD, DM II, seizures, and PUD admitted following an episode of syncope.  Evaluation regarding etiology of syncope is thus far unremarkable.  Patient also noted periumbilical pain, with a reported h/o PUD.  She also continues to experience upper GI upset despite Nexium 40mg  BID. Patient's prior EGD in 2015 was notable for erosive gastropathy.  She is also s/p gastric bypass, CCY, and abdominal hysterectomy.  Therefore, the DDx includes ulcers, gastritis, reflux esophagitis, dietary intolerances, mediation adverse effects, and post-surgical adhesions. The immediate pain after ingestion of apple cider vinegar raises suspicion for an exacerbation of gastritis, or possible anastomotic ulcer.  Patient's periumbilical pain may be secondary to adhesions.  Will also work up chronic anemia, which is likely due to malabsorption after gastric bypass.  Recommendations: - Plan for EGD tomorrow afternoon per Dr. Vira Agar - Continue Protonix 40mg  BID - Increase Carafate to 1g q ACHS  - Advise patient to minimize narcotic use - Vitamin D, A, E, iron panel, and ferritin pending - Further recs pending results  Thank you for the consult. We will follow along with you. Please call with questions or concerns.  Lavera Guise, PA-C [facility] Phone: [phone] Pager: [phone]

## 2016-09-27 NOTE — Progress Notes (Signed)
*  PRELIMINARY RESULTS* Echocardiogram 2D Echocardiogram has been performed.  Sherrie Sport 09/27/2016, 2:18 PM

## 2016-09-27 NOTE — Progress Notes (Signed)
Pt. admitted to unit, [id number] from ED, report from Vicente Males, [place]. Oriented to room, call bell, Ascom phones and staff. Bed in low position. Fall safety plan reviewed, yellow non-skid socks in place, bed alarm on. Full assessment to Epic; skin assessed with Georgina Quint, RN. Telemetry box verified with CCMD and Debbe Mounts, NT: [phone] . Will continue to monitor.

## 2016-09-27 NOTE — ED Notes (Signed)
Patient transported to ultrasound at this time.  Will continue to monitor.

## 2016-09-27 NOTE — Consult Note (Signed)
Patient with epigastric and supraumbilical pain after taking apple cider vinegar.  She developed severe abd pain and diaphoresis and then passed out.  CT showed hiatal hernia only.  She has had gastric bypass in the past and last EGD 05/2014 showed erosive gastropathy.  Plan EGD for tomorrow afternoon.

## 2016-09-28 ENCOUNTER — Encounter
Admission: EM | Disposition: A | Discharge status: Home / Self Care | Payer: Self-pay | Source: Home / Self Care | Attending: Internal Medicine

## 2016-09-28 ENCOUNTER — Encounter: Payer: Self-pay | Admitting: *Deleted

## 2016-09-28 ENCOUNTER — Inpatient Hospital Stay: Payer: Medicare Other | Admitting: Certified Registered Nurse Anesthetist

## 2016-09-28 HISTORY — PX: ESOPHAGOGASTRODUODENOSCOPY (EGD) WITH PROPOFOL: SHX5813

## 2016-09-28 LAB — ECHOCARDIOGRAM COMPLETE
HEIGHTINCHES: 67 in
Weight: 5196.8 oz

## 2016-09-28 LAB — BASIC METABOLIC PANEL
Anion gap: 2 — ABNORMAL LOW (ref 5–15)
BUN: 18 mg/dL (ref 6–20)
CALCIUM: 8.2 mg/dL — AB (ref 8.9–10.3)
CHLORIDE: 111 mmol/L (ref 101–111)
CO2: 25 mmol/L (ref 22–32)
CREATININE: 1.16 mg/dL — AB (ref 0.44–1.00)
GFR calc non Af Amer: 51 mL/min — ABNORMAL LOW (ref >60)
GFR, EST AFRICAN AMERICAN: 59 mL/min — AB (ref >60)
Glucose, Bld: 123 mg/dL — ABNORMAL HIGH (ref 65–99)
Potassium: 3.7 mmol/L (ref 3.5–5.1)
SODIUM: 138 mmol/L (ref 135–145)

## 2016-09-28 LAB — TROPONIN I

## 2016-09-28 LAB — CBC
HCT: 27.2 % — ABNORMAL LOW (ref 35.0–47.0)
Hemoglobin: 8.5 g/dL — ABNORMAL LOW (ref 12.0–16.0)
MCH: 23.2 pg — AB (ref 26.0–34.0)
MCHC: 31.2 g/dL — AB (ref 32.0–36.0)
MCV: 74.6 fL — AB (ref 80.0–100.0)
PLATELETS: 318 10*3/uL (ref 150–440)
RBC: 3.65 MIL/uL — ABNORMAL LOW (ref 3.80–5.20)
RDW: 19.8 % — AB (ref 11.5–14.5)
WBC: 10.9 10*3/uL (ref 3.6–11.0)

## 2016-09-28 LAB — VITAMIN D 25 HYDROXY (VIT D DEFICIENCY, FRACTURES): Vit D, 25-Hydroxy: 9 ng/mL — ABNORMAL LOW (ref 30.0–100.0)

## 2016-09-28 SURGERY — EGD (ESOPHAGOGASTRODUODENOSCOPY)
Anesthesia: General

## 2016-09-28 SURGERY — ESOPHAGOGASTRODUODENOSCOPY (EGD) WITH PROPOFOL
Anesthesia: General

## 2016-09-28 MED ORDER — VITAMIN D (ERGOCALCIFEROL) 1.25 MG (50000 UNIT) PO CAPS
50000.0000 [IU] | ORAL_CAPSULE | ORAL | Status: DC
  Administered 2016-09-28: 50000 [IU] via ORAL
  Filled 2016-09-28: qty 1

## 2016-09-28 MED ORDER — FENTANYL CITRATE (PF) 100 MCG/2ML IJ SOLN
INTRAMUSCULAR | Status: DC | PRN
  Administered 2016-09-28: 50 ug via INTRAVENOUS

## 2016-09-28 MED ORDER — PROPOFOL 500 MG/50ML IV EMUL
INTRAVENOUS | Status: DC | PRN
  Administered 2016-09-28: 150 ug/kg/min via INTRAVENOUS

## 2016-09-28 MED ORDER — LIDOCAINE HCL (CARDIAC) 20 MG/ML IV SOLN
INTRAVENOUS | Status: DC | PRN
  Administered 2016-09-28: 100 mg via INTRAVENOUS

## 2016-09-28 MED ORDER — PROPOFOL 10 MG/ML IV BOLUS
INTRAVENOUS | Status: DC | PRN
  Administered 2016-09-28: 40 mg via INTRAVENOUS
  Administered 2016-09-28: 30 mg via INTRAVENOUS

## 2016-09-28 MED ORDER — MIDAZOLAM HCL 2 MG/2ML IJ SOLN
INTRAMUSCULAR | Status: DC | PRN
  Administered 2016-09-28: 1 mg via INTRAVENOUS

## 2016-09-28 MED ORDER — SODIUM CHLORIDE 0.9 % IV SOLN
Freq: Once | INTRAVENOUS | Status: AC
  Administered 2016-09-28: 15:00:00 via INTRAVENOUS

## 2016-09-28 NOTE — Progress Notes (Addendum)
[facility] NAME: Lindsey [name]    MR#:  [id number]  DATE OF BIRTH:  [birth year]  SUBJECTIVE:  CHIEF COMPLAINT:   Chief Complaint  Patient presents with  . Loss of Consciousness  Patient is 55 year old African-American female with past medical history significant for history of COPD, gastroesophageal reflux disease, hyperlipidemia, hypertension, obstructive sleep apnea, stroke, Erosive gastropathy on EGD 2015 by Dr. Vira Agar, who was having periumbilical abdominal pain for a while , came with worsening. Umbilical pain after she started using apple cider vinegar as dietary aid. She was noted to be somewhat dehydrated with elevated creatinine, hypotensive, tachycardic, she was given IV fluids and improved. She was seen by Dr. Vira Agar and recommended EGD, to be performed today. The patient still complains of periumbilical abdominal pain and back pain which is chronic. Rectal exam was done in emergency room, guaiac negative. Syncope workup was initiated, echocardiogram was unremarkable, ultrasound of carotid arteries was also unremarkable, however, right ICA was not visualized due to central line placement. Orthostatic vital signs are unremarkable.    Review of Systems  Gastrointestinal: Positive for abdominal pain.    VITAL SIGNS: Blood pressure (!) 109/59, pulse 90, temperature 98 F (36.7 C), resp. rate 18, height 5\' 7"  (1.702 m), weight (!) 147.3 kg (324 lb 12.8 oz), SpO2 97 %.  PHYSICAL EXAMINATION:   GENERAL:  55 y.o.-year-old obese African-American patient lying in the bed with no acute distress.  EYES: Pupils equal, round, reactive to light and accommodation. No scleral icterus. Extraocular muscles intact.  HEENT: Head atraumatic, normocephalic. Oropharynx and nasopharynx clear.  NECK:  Supple, no jugular venous distention. No thyroid enlargement, no tenderness.  LUNGS: Normal breath sounds bilaterally, no wheezing,  rales,rhonchi or crepitation. No use of accessory muscles of respiration.  CARDIOVASCULAR: S1, S2 normal. No murmurs, rubs, or gallops.  ABDOMEN: Soft, tender in periumbilical area, some voluntary guarding, no rebound, nondistended. Bowel sounds present. No organomegaly or mass.  EXTREMITIES: No pedal edema, cyanosis, or clubbing.  NEUROLOGIC: Cranial nerves II through XII are intact. Muscle strength 5/5 in all extremities. Sensation intact. Gait not checked.  PSYCHIATRIC: The patient is alert and oriented x 3.  SKIN: No obvious rash, lesion, or ulcer.   ORDERS/RESULTS REVIEWED:   CBC  Recent Labs Lab 09/26/16 1930 09/27/16 1205 09/27/16 1622 09/28/16 0450  WBC 9.0 13.7*  --  10.9  HGB 10.3* 9.2* 8.9* 8.5*  HCT 33.6* 28.9*  --  27.2*  PLT 404 361  --  318  MCV 75.3* 74.1*  --  74.6*  MCH 23.2* 23.6*  --  23.2*  MCHC 30.7* 31.8*  --  31.2*  RDW 20.0* 19.8*  --  19.8*  LYMPHSABS 1.8  --   --   --   MONOABS 0.7  --   --   --   EOSABS 0.1  --   --   --   BASOSABS 0.0  --   --   --    ------------------------------------------------------------------------------------------------------------------  Chemistries   Recent Labs Lab 09/26/16 1930 09/27/16 1205 09/28/16 0450  NA 137 138 138  K 3.7 3.9 3.7  CL 107 110 111  CO2 23 24 25   GLUCOSE 143* 117* 123*  BUN 30* 26* 18  CREATININE 1.46* 1.14* 1.16*  CALCIUM 8.5* 8.3* 8.2*  AST 23  --   --   ALT 24  --   --   ALKPHOS 124  --   --  BILITOT <0.1*  --   --    ------------------------------------------------------------------------------------------------------------------ estimated creatinine clearance is 81 mL/min (by C-G formula based on SCr of 1.16 mg/dL (H)). ------------------------------------------------------------------------------------------------------------------ No results for input(s): TSH, T4TOTAL, T3FREE, THYROIDAB in the last 72 hours.  Invalid input(s): FREET3  Cardiac Enzymes  Recent Labs Lab  09/27/16 1205 09/27/16 1622 09/27/16 2329  TROPONINI <0.03 <0.03 <0.03   ------------------------------------------------------------------------------------------------------------------ Invalid input(s): POCBNP ---------------------------------------------------------------------------------------------------------------  RADIOLOGY: Ct Abdomen Pelvis Wo Contrast  Result Date: 09/27/2016 CLINICAL DATA:  Syncope. EXAM: CT ABDOMEN AND PELVIS WITHOUT CONTRAST TECHNIQUE: Multidetector CT imaging of the abdomen and pelvis was performed following the standard protocol without IV contrast. COMPARISON:  09/15/2012 FINDINGS: Lower chest: No acute abnormality. Hepatobiliary: No focal liver abnormality is seen. Status post cholecystectomy. No biliary dilatation. Pancreas: Unremarkable. No pancreatic ductal dilatation or surrounding inflammatory changes. Spleen: Normal in size without focal abnormality. Adrenals/Urinary Tract: Adrenal glands are unremarkable. Kidneys are normal, without renal calculi, focal lesion, or hydronephrosis. Bladder is unremarkable. Stomach/Bowel: Hiatal hernia. Prior gastric surgery. Small bowel, appendix and colon are unremarkable. No acute inflammatory changes. Vascular/Lymphatic: No significant vascular findings are present. No enlarged abdominal or pelvic lymph nodes. Reproductive: Status post hysterectomy. No adnexal masses. Other: No abdominal wall hernia or abnormality. No abdominopelvic ascites. Musculoskeletal: No acute or significant osseous findings. Prior posterior decompression with instrumented fusion at L5-S1. IMPRESSION: No acute findings.  Hiatal hernia. Electronically Signed   By: Andreas Newport M.D.   On: 09/27/2016 01:12   Dg Chest 2 View  Result Date: 09/26/2016 CLINICAL DATA:  Initial evaluation for acute syncope. EXAM: CHEST  2 VIEW COMPARISON:  Prior radiograph from 06/29/2014. FINDINGS: Mild cardiomegaly is stable. Mediastinal silhouette within normal  limits. Lungs normally inflated. No focal infiltrate, pulmonary edema, or pleural effusion. No pneumothorax. No acute osseous abnormality.  Spinal stimulator device noted. IMPRESSION: No active cardiopulmonary disease. Electronically Signed   By: Jeannine Boga M.D.   On: 09/26/2016 19:18   US Renal  Result Date: 09/27/2016 CLINICAL DATA:  Acute renal insufficiency EXAM: RENAL / URINARY TRACT ULTRASOUND COMPLETE COMPARISON:  09/27/2016 FINDINGS: Right Kidney: Length: 10 cm. Echogenicity within normal limits. No mass or hydronephrosis visualized. Left Kidney: Length: 11.7 cm. Echogenicity within normal limits. No mass or hydronephrosis visualized. Bladder: Appears normal for degree of bladder distention. IMPRESSION: Unremarkable kidneys.  No change from the prior CT examination. Electronically Signed   By: Inez Catalina M.D.   On: 09/27/2016 14:26   US Carotid Bilateral  Result Date: 09/27/2016 CLINICAL DATA:  Syncope EXAM: BILATERAL CAROTID DUPLEX ULTRASOUND TECHNIQUE: Pearline Cables scale imaging, color Doppler and duplex ultrasound were performed of bilateral carotid and vertebral arteries in the neck. COMPARISON:  None. FINDINGS: Criteria: Quantification of carotid stenosis is based on velocity parameters that correlate the residual internal carotid diameter with NASCET-based stenosis levels, using the diameter of the distal internal carotid lumen as the denominator for stenosis measurement. The following velocity measurements were obtained: RIGHT ICA: The internal carotid could not be measured because a dressing over the right neck prevented direct imaging of the vessel. CCA:  83 cm/sec SYSTOLIC ICA/CCA RATIO:  Not applicable DIASTOLIC ICA/CCA RATIO:  Not applicable ECA: The external carotid could not be measured because a dressing over the right neck prevented direct imaging of the vessel. LEFT ICA:  64 cm/sec CCA:  96 cm/sec SYSTOLIC ICA/CCA RATIO:  0.7 DIASTOLIC ICA/CCA RATIO:  1.0 ECA:  67 cm/sec RIGHT  CAROTID ARTERY: A dressing over the right side of the neck prevented  imaging of the right internal and external carotid arteries. Imaging of the bulb was achieved and there is little if any plaque in the bulb. There is little if any plaque along the common carotid. RIGHT VERTEBRAL ARTERY:  Antegrade LEFT CAROTID ARTERY:  Minimal soft plaque in the bulb. LEFT VERTEBRAL ARTERY:  Antegrade IMPRESSION: Limited examination. The right internal carotid artery could not be visualized and cannot be adequately evaluated. The right bulb is grossly clear. Less than 50% stenosis in the left internal carotid artery. Bilateral antegrade vertebral artery flow. Electronically Signed   By: Marybelle Killings M.D.   On: 09/27/2016 11:46   Dg Chest Port 1 View  Result Date: 09/27/2016 CLINICAL DATA:  Syncope today. EXAM: PORTABLE CHEST 1 VIEW COMPARISON:  09/26/2016 at 18:56 FINDINGS: There is a new right jugular central line extending to the low SVC. There is no pneumothorax. Unchanged mild right hemidiaphragm elevation. Normal pulmonary vasculature. IMPRESSION: New right jugular central line.  No pneumothorax. Electronically Signed   By: Andreas Newport M.D.   On: 09/27/2016 00:36    EKG:  Orders placed or performed during the hospital encounter of 09/26/16  . ED EKG  . ED EKG  . Repeat EKG  . Repeat EKG  . EKG 12-Lead  . EKG 12-Lead    ASSESSMENT AND PLAN:  Principal Problem:   Syncope #1 syncope, suspected due to dehydration, poor by mouth intake, orthostatic vital signs were unremarkable, carotid ultrasound,  Echocardiogram were normal, however, right ICA was not well-visualized due to central line  #2. Hypotension,resolved with  IV fluid administration #3. Acute renal insufficiency,stable, urinalysis was unremarkable,  unremarkable renal ultrasound #4anemia, rectal exam was in the emergency room, guaiac negative, anemia has worsened with dehydration, follow closely, Transfuse as needed  #5. Leukocytosis,  unclear etiology, could be stress related, Resolved with no antibiotic therapy  #6. Hyperglycemia, hemoglobin A1c is pending to rule out diabetes #7. periumbilical abdominal pain worsened with vinegar use, concerning for peptic ulcer disease exacerbation, continue Protonix, Carafate,  EGD today, appreciate Dr. Percell Boston input, possibly initiated on full liquid diet and discharged home today or tomorrow morning depending on findings #8. Vitamin D deficiency, initiate vitamin D supplementation orally Management plans discussed with the patient, family and they are in agreement.   DRUG ALLERGIES:  Allergies  Allergen Reactions  . Morphine Hives  . Iodinated Diagnostic Agents Hives  . Aspirin Hives    Noted on MD progress notes and discussed with MD 09/02/15  . Etodolac Hives  . Ibuprofen Hives  . Shellfish Allergy Hives  . Tylenol [Acetaminophen]     CODE STATUS:     Code Status Orders        Start     Ordered   09/27/16 1129  Full code  Continuous     09/27/16 1128    Code Status History    Date Active Date Inactive Code Status Order ID Comments User Context   08/31/2015  5:48 PM 09/02/2015  7:52 PM Full Code [id number]  Nicholes Mango, MD ED      TOTAL TIME TAKING CARE OF THIS PATIENT: 35 minutes.    Theodoro Grist M.D on 09/28/2016 at 1:19 PM  Between 7am to 6pm - Pager - [phone]  After 6pm go to [email or web address] - password [email or web address] [facility]  Office  [phone]  CC: Primary care physician; Volanda Napoleon, MD

## 2016-09-28 NOTE — Progress Notes (Signed)
Per Dr. Vira Agar, patient can receive all morning meds except d/c lovenox. Patient will need to stop clears and be NPO except sips with meds around 1000 this morning.

## 2016-09-28 NOTE — Transfer of Care (Signed)
Immediate Anesthesia Transfer of Care Note  Patient: Lindsey [name]  Procedure(s) Performed: Procedure(s): ESOPHAGOGASTRODUODENOSCOPY (EGD) WITH PROPOFOL (N/A)  Patient Location: PACU  Anesthesia Type:General  Level of Consciousness: awake  Airway & Oxygen Therapy: Patient Spontanous Breathing and Patient connected to nasal cannula oxygen  Post-op Assessment: Report given to RN and Post -op Vital signs reviewed and stable  Post vital signs: Reviewed and stable  Last Vitals:  Vitals:   09/28/16 1424 09/28/16 1609  BP: (!) 112/54   Pulse: 98   Resp: 16   Temp: 36.9 C (!) (P) 36.1 C    Last Pain:  Vitals:   09/28/16 1609  TempSrc: (P) Tympanic  PainSc:       Patients Stated Pain Goal: 5 (15/94/70 7615)  Complications: No apparent anesthesia complications

## 2016-09-28 NOTE — Op Note (Signed)
[facility] Patient Name: Lindsey [name] Procedure Date: 09/28/2016 3:42 PM MRN: [id number] Account #: [id number] Date of Birth: [birth year] Admit Type: Inpatient Age: 55 Room: [facility] ENDO ROOM 1 Gender: Female Note Status: Finalized Procedure:            Upper GI endoscopy Indications:          Epigastric abdominal pain, Periumbilical abdominal pain Providers:            Manya Silvas, MD Referring MD:         Venetia Maxon. Elijio Miles, MD (Referring MD) Medicines:            Propofol per Anesthesia Complications:        No immediate complications. Procedure:            Pre-Anesthesia Assessment:                       - After reviewing the risks and benefits, the patient                        was deemed in satisfactory condition to undergo the                        procedure.                       After obtaining informed consent, the endoscope was                        passed under direct vision. Throughout the procedure,                        the patient's blood pressure, pulse, and oxygen                        saturations were monitored continuously. The Endoscope                        was introduced through the mouth, and advanced to the                        mid-jejunum. The upper GI endoscopy was accomplished                        without difficulty. The patient tolerated the procedure                        well. Findings:      The examined esophagus was normal.      Evidence of a gastric bypass was found. A gastric pouch was found. The       duodenum-to-jejunum limb was examined. The scope was passed deep into       the small bowel well into jejunum where scattered inflammation seen       which could well be the source of her pain. Impression:           - Normal esophagus.                       - Gastric bypass.                       - No  specimens collected. Jejunal inflammation Recommendation:       - The findings and  recommendations were discussed with                        the patient. Carafate slurry qid, full liquid diet.                        Avoid all NSAID and avoid vinegar. Manya Silvas, MD 09/28/2016 4:05:32 PM This report has been signed electronically. Number of Addenda: 0 Note Initiated On: 09/28/2016 3:42 PM      [facility]

## 2016-09-28 NOTE — Care Management (Signed)
Patient is scheduled for EGD today

## 2016-09-28 NOTE — Anesthesia Procedure Notes (Signed)
Date/Time: 09/28/2016 3:58 PM Performed by: Allean Found Pre-anesthesia Checklist: Patient identified, Emergency Drugs available, Suction available, Patient being monitored and Timeout performed Patient Re-evaluated:Patient Re-evaluated prior to inductionOxygen Delivery Method: Nasal cannula Preoxygenation: Pre-oxygenation with 100% oxygen Intubation Type: IV induction

## 2016-09-28 NOTE — Consult Note (Signed)
Patient with significant inflammation in jejunum with erythema and grannularity scattered in some areas.  Will start her on carafate if not already on it, Soft diet. Avoid all NSAID medicine.

## 2016-09-28 NOTE — Anesthesia Preprocedure Evaluation (Signed)
Anesthesia Evaluation  Patient identified by MRN, date of birth, ID band Patient awake    Reviewed: Allergy & Precautions, NPO status , Patient's Chart, lab work & pertinent test results  History of Anesthesia Complications (+) PROLONGED EMERGENCE  Airway Mallampati: II  TM Distance: >3 FB Neck ROM: Full    Dental  (+) Edentulous Upper, Edentulous Lower   Pulmonary sleep apnea , COPD,    breath sounds clear to auscultation- rhonchi (-) wheezing      Cardiovascular hypertension, Pt. on medications and Pt. on home beta blockers + CAD and + Past MI  (-) Cardiac Stents and (-) CABG  Rhythm:Regular Rate:Normal - Systolic murmurs and - Diastolic murmurs    Neuro/Psych Seizures -,  CVA negative psych ROS   GI/Hepatic Neg liver ROS, PUD, GERD  ,  Endo/Other  diabetes (diet controlled), Type 2  Renal/GU negative Renal ROS     Musculoskeletal negative musculoskeletal ROS (+)   Abdominal (+) + obese,   Peds  Hematology negative hematology ROS (+)   Anesthesia Other Findings Past Medical History: No date: Allergy No date: COPD (chronic obstructive pulmonary disease) (* No date: Diabetes mellitus without complication (HCC) No date: GERD (gastroesophageal reflux disease) No date: Heart attack No date: Hyperlipidemia No date: Hypertension No date: PUD (peptic ulcer disease) No date: Sleep apnea No date: Stroke [facility])   Reproductive/Obstetrics                             Anesthesia Physical Anesthesia Plan  ASA: III  Anesthesia Plan: General   Post-op Pain Management:    Induction: Intravenous  Airway Management Planned: Natural Airway  Additional Equipment:   Intra-op Plan:   Post-operative Plan:   Informed Consent: I have reviewed the patients History and Physical, chart, labs and discussed the procedure including the risks, benefits and alternatives for the proposed anesthesia with  the patient or authorized representative who has indicated his/her understanding and acceptance.   Dental advisory given  Plan Discussed with: CRNA and Anesthesiologist  Anesthesia Plan Comments:         Anesthesia Quick Evaluation

## 2016-09-28 NOTE — Progress Notes (Signed)
Inpatient Diabetes Program Recommendations  AACE/ADA: New Consensus Statement on Inpatient Glycemic Control (2015)  Target Ranges:  Prepandial:   less than 140 mg/dL      Peak postprandial:   less than 180 mg/dL (1-2 hours)      Critically ill patients:  140 - 180 mg/dL   Lab Results  Component Value Date   GLUCAP 114 (H) 09/27/2016   HGBA1C 6.7 (H) 09/01/2015    Review of Glycemic Control  Results for VALARY, MANAHAN (MRN [id number]) as of 09/28/2016 13:21  Ref. Range 09/27/2016 11:19  Glucose-Capillary Latest Ref Range: 65 - 99 mg/dL 114 (H)    Diabetes history:Type 2, A1C in progress Outpatient Diabetes medications: Tradjenta 5mg /day Current orders for Inpatient glycemic control: Tradjenta 5mg /day  Inpatient Diabetes Program Recommendations:   Agree with current medications for blood sugar management.    Gentry Fitz, RN, BA, MHA, CDE Diabetes Coordinator Inpatient Glycemic Control Team [phone] (Team Pager) [phone] ([place]) 09/28/2016 1:32 PM

## 2016-09-29 ENCOUNTER — Encounter: Payer: Self-pay | Admitting: Unknown Physician Specialty

## 2016-09-29 DIAGNOSIS — D72829 Elevated white blood cell count, unspecified: Secondary | ICD-10-CM

## 2016-09-29 DIAGNOSIS — R198 Other specified symptoms and signs involving the digestive system and abdomen: Secondary | ICD-10-CM

## 2016-09-29 DIAGNOSIS — E559 Vitamin D deficiency, unspecified: Secondary | ICD-10-CM

## 2016-09-29 DIAGNOSIS — R7303 Prediabetes: Secondary | ICD-10-CM

## 2016-09-29 DIAGNOSIS — K529 Noninfective gastroenteritis and colitis, unspecified: Secondary | ICD-10-CM

## 2016-09-29 DIAGNOSIS — I959 Hypotension, unspecified: Secondary | ICD-10-CM

## 2016-09-29 DIAGNOSIS — N289 Disorder of kidney and ureter, unspecified: Secondary | ICD-10-CM

## 2016-09-29 DIAGNOSIS — D649 Anemia, unspecified: Secondary | ICD-10-CM

## 2016-09-29 LAB — VITAMIN E: Alpha-Tocopherol: 6 mg/L (ref 5.3–16.8)

## 2016-09-29 LAB — HEMOGLOBIN A1C
HEMOGLOBIN A1C: 6.7 % — AB (ref 4.8–5.6)
MEAN PLASMA GLUCOSE: 146 mg/dL

## 2016-09-29 LAB — VITAMIN A: Vitamin A (Retinoic Acid): 22 ug/dL (ref 20–65)

## 2016-09-29 LAB — HEMOGLOBIN: HEMOGLOBIN: 8.7 g/dL — AB (ref 12.0–16.0)

## 2016-09-29 MED ORDER — VITAMIN D (ERGOCALCIFEROL) 1.25 MG (50000 UNIT) PO CAPS: 50000.0000 [IU] | ORAL_CAPSULE | ORAL | 5 refills | Status: DC

## 2016-09-29 MED ORDER — SUCRALFATE 1 G PO TABS: 1.0000 g | ORAL_TABLET | Freq: Three times a day (TID) | ORAL | 5 refills | Status: DC

## 2016-09-29 NOTE — Care Management Important Message (Signed)
Important Message  Patient Details  Name: Lindsey [name] MRN: [id number] Date of Birth: 1960-12-10   Medicare Important Message Given:  Yes    Katrina Stack, RN 09/29/2016, 9:42 AM

## 2016-09-29 NOTE — Progress Notes (Signed)
Patient discharged via wheelchair and private vehicle. IJ removed and catheter intact. All discharge instructions given and patient verbalizes understanding. Tele removed and returned. No prescriptions given to patient No distress noted.

## 2016-09-29 NOTE — Discharge Summary (Signed)
[facility] NAME: Lindsey [name]    MR#:  [id number]  DATE OF BIRTH:  [birth year]  DATE OF ADMISSION:  09/26/2016 ADMITTING PHYSICIAN: Saundra Shelling, MD  DATE OF DISCHARGE: 09/29/2016 10:25 AM  PRIMARY CARE PHYSICIAN: Volanda Napoleon, MD     ADMISSION DIAGNOSIS:  Syncope [R55] Epigastric pain [R10.13] Encounter for central line placement [Z45.2] Hypotension, unspecified hypotension type [I95.9] Syncope, unspecified syncope type [R55]  DISCHARGE DIAGNOSIS:  Principal Problem:   Syncope Active Problems:   Hypotension   Jejunal inflammation   Abnormal findings on esophagogastroduodenoscopy (EGD)   Anemia   Leukocytosis   Prediabetes   Vitamin D deficiency   Renal insufficiency   SECONDARY DIAGNOSIS:   Past Medical History:  Diagnosis Date  . Allergy   . COPD (chronic obstructive pulmonary disease) ([place])   . Diabetes mellitus without complication ([place])   . GERD (gastroesophageal reflux disease)   . Heart attack   . Hyperlipidemia   . Hypertension   . PUD (peptic ulcer disease)   . Sleep apnea   . Stroke ([place])     .pro HOSPITAL COURSE:   Patient is 55 year old African-American female with past medical history significant for history of COPD, gastroesophageal reflux disease, hyperlipidemia, hypertension, obstructive sleep apnea, stroke, Erosive gastropathy on EGD 2015 by Dr. Vira Agar, who was having periumbilical abdominal pain for a while , came with worsening. Umbilical pain after she started using apple cider vinegar as dietary aid. She was noted to be somewhat dehydrated with elevated creatinine, hypotensive, tachycardic, she was given IV fluids and improved. She was seen by Dr. Vira Agar and recommended EGD, to be performed today. The patient still complains of periumbilical abdominal pain and back pain which is chronic. Rectal exam was done in emergency room, guaiac negative. Syncope workup was  initiated, echocardiogram was unremarkable, ultrasound of carotid arteries was also unremarkable, however, right ICA was not visualized due to central line placement. Orthostatic vital signs are unremarkable. EGD was performed by Dr. Vira Agar on 09/28/2016, revealing significant inflammation in the jejunum with erythema and granularity, scattered in some areas. Patient was advised to avoid all nonsteroidal anti-inflammatory medications and continue PPI and Carafate. Patient's diet was advanced to full liquid diet and she tolerated this well with no significant discomfort in abdomen. She was felt to be stable to be discharged home today. Discussion by problem: #1 syncope, suspected due to dehydration, poor by mouth intake, orthostatic vital signs were unremarkable, carotid ultrasound,  Echocardiogram were normal, however, right ICA was not well-visualized due to central line placement in neck area #2. Hypotension,resolved with  IV fluid administration, patient was advised to stop clonidine, metoprolol, olmesartan/HCTZ until her blood pressure improves #3. Acute renal insufficiency,stable, urinalysis was unremarkable,  unremarkable renal ultrasound #4anemia, rectal exam was Hemoccult negative in the emergency room,  anemia has worsened with rehydration, follow closely as outpatient, iron studies revealed low ferritin levels of 7, saturation was also low at 10, signifying iron deficiency anemia. Patient needs to be started on iron supplementation as soon as her abdominal pain resolves. #5. Leukocytosis, likely stress related, resolved with no antibiotic therapy  #6. Hyperglycemia, hemoglobin A1c  was 6.7, patient needs to be followed for prediabetes/diabetes #7. periumbilical abdominal pain worsened with vinegar use, status post EGD by Dr. Vira Agar on 09/28/2016, revealing significant inflammation in the jejunum with erythema and granularity, scattered in some areas , continue Nexium twice a day, Carafate,    continue full liquid  diet and advance to soft diet as tolerated. The patient was advised not to use any nonsteroidal anti-inflammatory medications, discussed with her extensively #8. Vitamin D deficiency, initiated vitamin D supplementation orally DISCHARGE CONDITIONS:   Stable  CONSULTS OBTAINED:  Treatment Team:  Manya Silvas, MD  DRUG ALLERGIES:   Allergies  Allergen Reactions  . Morphine Hives  . Iodinated Diagnostic Agents Hives  . Aspirin Hives    Noted on MD progress notes and discussed with MD 09/02/15  . Etodolac Hives  . Ibuprofen Hives  . Shellfish Allergy Hives  . Tylenol [Acetaminophen]     DISCHARGE MEDICATIONS:   Discharge Medication List as of 09/29/2016  9:56 AM    START taking these medications   Details  sucralfate (CARAFATE) 1 g tablet Take 1 tablet (1 g total) by mouth 4 (four) times daily -  with meals and at bedtime., Starting Fri 09/29/2016, Normal      CONTINUE these medications which have NOT CHANGED   Details  albuterol (PROVENTIL HFA;VENTOLIN HFA) 108 (90 BASE) MCG/ACT inhaler Inhale 2 puffs into the lungs every 4 (four) hours as needed for wheezing or shortness of breath., Historical Med    albuterol (PROVENTIL) (2.5 MG/3ML) 0.083% nebulizer solution Take 2.5 mg by nebulization every 6 (six) hours as needed for wheezing or shortness of breath., Historical Med    ALPRAZolam (XANAX) 1 MG tablet Take 1-1.5 mg by mouth 2 (two) times daily. Pt takes one tablet in the morning and one and one-half tablet at night., Historical Med    baclofen (LIORESAL) 10 MG tablet Take 10 mg by mouth 3 (three) times daily., Historical Med    benzonatate (TESSALON) 100 MG capsule Take 100 mg by mouth 3 (three) times daily as needed for cough., Historical Med    Calcium Lactate 648 MG TABS Take 648 mg by mouth 2 (two) times daily., Historical Med    clopidogrel (PLAVIX) 75 MG tablet Take 1 tablet (75 mg total) by mouth daily., Starting Wed 09/01/2015, Normal    !!  desvenlafaxine (PRISTIQ) 100 MG 24 hr tablet Take 100 mg by mouth daily. Pt takes 100 mg tab and 50 mg tab for total of 150 mg, Historical Med    !! desvenlafaxine (PRISTIQ) 50 MG 24 hr tablet Take 50 mg by mouth daily. Pt takes 100 mg tab and 50 mg tab for total of 150 mg, Historical Med    esomeprazole (NEXIUM) 40 MG capsule Take 40 mg by mouth 2 (two) times daily., Historical Med    fentaNYL (DURAGESIC - DOSED MCG/HR) 50 MCG/HR Place 50 mcg onto the skin every 3 (three) days., Historical Med    fexofenadine (ALLEGRA) 60 MG tablet Take 60 mg by mouth 2 (two) times daily., Historical Med    fluticasone (FLONASE) 50 MCG/ACT nasal spray Place 1 spray into both nostrils 2 (two) times daily., Historical Med    folic acid (FOLVITE) 1 MG tablet Take 1 mg by mouth daily., Historical Med    hydrOXYzine (ATARAX/VISTARIL) 25 MG tablet Take 25 mg by mouth 3 (three) times daily., Historical Med    l-methylfolate-B6-B12 (METANX) 3-35-2 MG TABS tablet Take 1 tablet by mouth 2 (two) times daily., Historical Med    ONGLYZA 5 MG TABS tablet Take 1 tablet by mouth daily., Starting Mon 08/28/2016, Historical Med    oxybutynin (DITROPAN) 5 MG tablet Take 5 mg by mouth 2 (two) times daily., Historical Med    oxyCODONE (OXY IR/ROXICODONE) 5 MG immediate release tablet Take  5 mg by mouth every 6 (six) hours as needed for severe pain., Historical Med    potassium chloride (K-DUR) 10 MEQ tablet Take 10 mEq by mouth daily., Historical Med    simvastatin (ZOCOR) 40 MG tablet Take 1 tablet by mouth daily., Starting Sun 09/24/2016, Historical Med    zolpidem (AMBIEN) 10 MG tablet Take 10 mg by mouth at bedtime as needed for sleep., Historical Med    mirtazapine (REMERON) 45 MG tablet Take 45 mg by mouth at bedtime., Historical Med     !! - Potential duplicate medications found. Please discuss with provider.    STOP taking these medications     cloNIDine (CATAPRES) 0.1 MG tablet      metoprolol succinate  (TOPROL-XL) 100 MG 24 hr tablet      olmesartan-hydrochlorothiazide (BENICAR HCT) 40-12.5 MG tablet          DISCHARGE INSTRUCTIONS:    The patient is to follow-up with primary care physician as outpatient  If you experience worsening of your admission symptoms, develop shortness of breath, life threatening emergency, suicidal or homicidal thoughts you must seek medical attention immediately by calling 911 or calling your MD immediately  if symptoms less severe.  You Must read complete instructions/literature along with all the possible adverse reactions/side effects for all the Medicines you take and that have been prescribed to you. Take any new Medicines after you have completely understood and accept all the possible adverse reactions/side effects.   Please note  You were cared for by a hospitalist during your hospital stay. If you have any questions about your discharge medications or the care you received while you were in the hospital after you are discharged, you can call the unit and asked to speak with the hospitalist on call if the hospitalist that took care of you is not available. Once you are discharged, your primary care physician will handle any further medical issues. Please note that NO REFILLS for any discharge medications will be authorized once you are discharged, as it is imperative that you return to your primary care physician (or establish a relationship with a primary care physician if you do not have one) for your aftercare needs so that they can reassess your need for medications and monitor your lab values.    Today   CHIEF COMPLAINT:   Chief Complaint  Patient presents with  . Loss of Consciousness    HISTORY OF PRESENT ILLNESS:  Lindsey [name]  is a 55 y.o. female with a known history of COPD, gastroesophageal reflux disease, hyperlipidemia, hypertension, obstructive sleep apnea, stroke, Erosive gastropathy on EGD 2015 by Dr. Vira Agar, who was having  periumbilical abdominal pain for a while , came with worsening. Umbilical pain after she started using apple cider vinegar as dietary aid. She was noted to be somewhat dehydrated with elevated creatinine, hypotensive, tachycardic, she was given IV fluids and improved. She was seen by Dr. Vira Agar and recommended EGD, to be performed today. The patient still complains of periumbilical abdominal pain and back pain which is chronic. Rectal exam was done in emergency room, guaiac negative. Syncope workup was initiated, echocardiogram was unremarkable, ultrasound of carotid arteries was also unremarkable, however, right ICA was not visualized due to central line placement. Orthostatic vital signs are unremarkable. EGD was performed by Dr. Vira Agar on 09/28/2016, revealing significant inflammation in the jejunum with erythema and granularity, scattered in some areas. Patient was advised to avoid all nonsteroidal anti-inflammatory medications and continue PPI and Carafate. Patient's  diet was advanced to full liquid diet and she tolerated this well with no significant discomfort in abdomen. She was felt to be stable to be discharged home today. Discussion by problem: #1 syncope, suspected due to dehydration, poor by mouth intake, orthostatic vital signs were unremarkable, carotid ultrasound,  Echocardiogram were normal, however, right ICA was not well-visualized due to central line placement in neck area #2. Hypotension,resolved with  IV fluid administration, patient was advised to stop clonidine, metoprolol, olmesartan/HCTZ until her blood pressure improves #3. Acute renal insufficiency,stable, urinalysis was unremarkable,  unremarkable renal ultrasound #4anemia, rectal exam was Hemoccult negative in the emergency room,  anemia has worsened with rehydration, follow closely as outpatient, iron studies revealed low ferritin levels of 7, saturation was also low at 10, signifying iron deficiency anemia. Patient needs to be  started on iron supplementation as soon as her abdominal pain resolves. #5. Leukocytosis, likely stress related, resolved with no antibiotic therapy  #6. Hyperglycemia, hemoglobin A1c  was 6.7, patient needs to be followed for prediabetes/diabetes #7. periumbilical abdominal pain worsened with vinegar use, status post EGD by Dr. Vira Agar on 09/28/2016, revealing significant inflammation in the jejunum with erythema and granularity, scattered in some areas , continue Nexium twice a day, Carafate,   continue full liquid diet and advance to soft diet as tolerated. The patient was advised not to use any nonsteroidal anti-inflammatory medications, discussed with her extensively #8. Vitamin D deficiency, initiated vitamin D supplementation orally    VITAL SIGNS:  Blood pressure 112/70, pulse 89, temperature 98.3 F (36.8 C), temperature source Oral, resp. rate 18, height 5\' 7"  (1.702 m), weight (!) 147 kg (324 lb), SpO2 95 %.  I/O:    Intake/Output Summary (Last 24 hours) at 09/29/16 1157 Last data filed at 09/29/16 0300  Gross per 24 hour  Intake          1291.25 ml  Output              450 ml  Net           841.25 ml    PHYSICAL EXAMINATION:  GENERAL:  55 y.o.-year-old patient lying in the bed with no acute distress.  EYES: Pupils equal, round, reactive to light and accommodation. No scleral icterus. Extraocular muscles intact.  HEENT: Head atraumatic, normocephalic. Oropharynx and nasopharynx clear.  NECK:  Supple, no jugular venous distention. No thyroid enlargement, no tenderness.  LUNGS: Normal breath sounds bilaterally, no wheezing, rales,rhonchi or crepitation. No use of accessory muscles of respiration.  CARDIOVASCULAR: S1, S2 normal. No murmurs, rubs, or gallops.  ABDOMEN: Soft, non-tender, non-distended. Bowel sounds present. No organomegaly or mass.  EXTREMITIES: No pedal edema, cyanosis, or clubbing.  NEUROLOGIC: Cranial nerves II through XII are intact. Muscle strength 5/5 in  all extremities. Sensation intact. Gait not checked.  PSYCHIATRIC: The patient is alert and oriented x 3.  SKIN: No obvious rash, lesion, or ulcer.   DATA REVIEW:   CBC  Recent Labs Lab 09/28/16 0450 09/29/16 0536  WBC 10.9  --   HGB 8.5* 8.7*  HCT 27.2*  --   PLT 318  --     Chemistries   Recent Labs Lab 09/26/16 1930  09/28/16 0450  NA 137  < > 138  K 3.7  < > 3.7  CL 107  < > 111  CO2 23  < > 25  GLUCOSE 143*  < > 123*  BUN 30*  < > 18  CREATININE 1.46*  < > 1.16*  CALCIUM 8.5*  < > 8.2*  AST 23  --   --   ALT 24  --   --   ALKPHOS 124  --   --   BILITOT <0.1*  --   --   < > = values in this interval not displayed.  Cardiac Enzymes  Recent Labs Lab 09/27/16 2329  TROPONINI <0.03    Microbiology Results  Results for orders placed or performed in visit on 06/29/14  Urine culture     Status: None   Collection Time: 06/30/14 11:55 PM  Result Value Ref Range Status   Micro Text Report   Final       SOURCE: CLEAN CATCH    ORGANISM 1                >100,000 CFU/ML ESCHERICHIA COLI   ORGANISM 2                20,000 CFU/ML ESCHERICHIA COLI   COMMENT                   TWO DIFFERENT COLONY TYPES TWO DIFFERENT   COMMENT                   SENSITIVITY PATTERNS   ANTIBIOTIC                    ORG#1    ORG#2     AMPICILLIN                    S        R         CEFAZOLIN                     S        S         CEFOXITIN                     S        I         CEFTRIAXONE                   S        S         CIPROFLOXACIN                 S        S         GENTAMICIN                    S        S         IMIPENEM                      S        S         LEVOFLOXACIN                  S        S         NITROFURANTOIN                I        I         TRIMETHOPRIM/SULFAMETHOXAZOLE S        S             RADIOLOGY:  US Renal  Result Date: 09/27/2016 CLINICAL DATA:  Acute renal insufficiency EXAM: RENAL / URINARY TRACT ULTRASOUND COMPLETE COMPARISON:   09/27/2016 FINDINGS: Right Kidney: Length: 10 cm. Echogenicity within normal limits. No mass or hydronephrosis visualized. Left Kidney: Length: 11.7 cm. Echogenicity within normal limits. No mass or hydronephrosis visualized. Bladder: Appears normal for degree of bladder distention. IMPRESSION: Unremarkable kidneys.  No change from the prior CT examination. Electronically Signed   By: Inez Catalina M.D.   On: 09/27/2016 14:26    EKG:   Orders placed or performed during the hospital encounter of 09/26/16  . ED EKG  . ED EKG  . Repeat EKG  . Repeat EKG  . EKG 12-Lead  . EKG 12-Lead      Management plans discussed with the patient, family and they are in agreement.  CODE STATUS:     Code Status Orders        Start     Ordered   09/27/16 1129  Full code  Continuous     09/27/16 1128    Code Status History    Date Active Date Inactive Code Status Order ID Comments User Context   08/31/2015  5:48 PM 09/02/2015  7:52 PM Full Code [id number]  Nicholes Mango, MD ED      TOTAL TIME TAKING CARE OF THIS PATIENT: 40 minutes.    Theodoro Grist M.D on 09/29/2016 at 11:57 AM  Between 7am to 6pm - Pager - [phone]  After 6pm go to [email or web address] - password [email or web address] [facility]  Office  [phone]  CC: Primary care physician; Volanda Napoleon, MD

## 2016-10-03 NOTE — Anesthesia Postprocedure Evaluation (Signed)
Anesthesia Post Note  Patient: Lindsey [name]  Procedure(s) Performed: Procedure(s) (LRB): ESOPHAGOGASTRODUODENOSCOPY (EGD) WITH PROPOFOL (N/A)  Patient location during evaluation: Endoscopy Anesthesia Type: General Level of consciousness: awake and alert Pain management: pain level controlled Vital Signs Assessment: post-procedure vital signs reviewed and stable Respiratory status: spontaneous breathing, nonlabored ventilation and respiratory function stable Cardiovascular status: blood pressure returned to baseline and stable Postop Assessment: no signs of nausea or vomiting Anesthetic complications: no    Last Vitals:  Vitals:   09/28/16 1931 09/29/16 0449  BP: 97/60 112/70  Pulse: 93 89  Resp:  18  Temp:  36.8 C    Last Pain:  Vitals:   09/29/16 0749  TempSrc:   PainSc: 0-No pain                 Eldean Klatt

## 2016-10-10 ENCOUNTER — Other Ambulatory Visit: Payer: Self-pay | Admitting: Internal Medicine

## 2016-10-10 DIAGNOSIS — Z1231 Encounter for screening mammogram for malignant neoplasm of breast: Secondary | ICD-10-CM

## 2016-11-21 ENCOUNTER — Ambulatory Visit
Admission: RE | Admit: 2016-11-21 | Discharge: 2016-11-21 | Disposition: A | Discharge status: Home / Self Care | Payer: Medicare Other | Source: Ambulatory Visit | Attending: Internal Medicine | Admitting: Internal Medicine

## 2016-11-21 DIAGNOSIS — Z1231 Encounter for screening mammogram for malignant neoplasm of breast: Secondary | ICD-10-CM | POA: Insufficient documentation

## 2016-12-23 ENCOUNTER — Emergency Department: Payer: Medicare Other

## 2016-12-23 ENCOUNTER — Observation Stay: Payer: Medicare Other

## 2016-12-23 ENCOUNTER — Inpatient Hospital Stay
Admission: EM | Admit: 2016-12-23 | Discharge: 2016-12-26 | DRG: 812 | Disposition: A | Discharge status: Home / Self Care | Payer: Medicare Other | Attending: Internal Medicine | Admitting: Internal Medicine

## 2016-12-23 ENCOUNTER — Encounter: Payer: Self-pay | Admitting: Emergency Medicine

## 2016-12-23 DIAGNOSIS — R000 Tachycardia, unspecified: Secondary | ICD-10-CM | POA: Diagnosis not present

## 2016-12-23 DIAGNOSIS — E119 Type 2 diabetes mellitus without complications: Secondary | ICD-10-CM | POA: Diagnosis present

## 2016-12-23 DIAGNOSIS — Z9884 Bariatric surgery status: Secondary | ICD-10-CM

## 2016-12-23 DIAGNOSIS — K648 Other hemorrhoids: Secondary | ICD-10-CM | POA: Diagnosis not present

## 2016-12-23 DIAGNOSIS — Z6841 Body Mass Index (BMI) 40.0 and over, adult: Secondary | ICD-10-CM

## 2016-12-23 DIAGNOSIS — K921 Melena: Secondary | ICD-10-CM | POA: Diagnosis present

## 2016-12-23 DIAGNOSIS — Z8673 Personal history of transient ischemic attack (TIA), and cerebral infarction without residual deficits: Secondary | ICD-10-CM

## 2016-12-23 DIAGNOSIS — N393 Stress incontinence (female) (male): Secondary | ICD-10-CM | POA: Diagnosis present

## 2016-12-23 DIAGNOSIS — Z8711 Personal history of peptic ulcer disease: Secondary | ICD-10-CM

## 2016-12-23 DIAGNOSIS — Z7902 Long term (current) use of antithrombotics/antiplatelets: Secondary | ICD-10-CM

## 2016-12-23 DIAGNOSIS — I959 Hypotension, unspecified: Secondary | ICD-10-CM | POA: Diagnosis not present

## 2016-12-23 DIAGNOSIS — I10 Essential (primary) hypertension: Secondary | ICD-10-CM | POA: Diagnosis not present

## 2016-12-23 DIAGNOSIS — J449 Chronic obstructive pulmonary disease, unspecified: Secondary | ICD-10-CM | POA: Diagnosis present

## 2016-12-23 DIAGNOSIS — I252 Old myocardial infarction: Secondary | ICD-10-CM | POA: Diagnosis not present

## 2016-12-23 DIAGNOSIS — Z7951 Long term (current) use of inhaled steroids: Secondary | ICD-10-CM

## 2016-12-23 DIAGNOSIS — E785 Hyperlipidemia, unspecified: Secondary | ICD-10-CM | POA: Diagnosis not present

## 2016-12-23 DIAGNOSIS — E860 Dehydration: Secondary | ICD-10-CM | POA: Diagnosis present

## 2016-12-23 DIAGNOSIS — K219 Gastro-esophageal reflux disease without esophagitis: Secondary | ICD-10-CM | POA: Diagnosis present

## 2016-12-23 DIAGNOSIS — R0600 Dyspnea, unspecified: Secondary | ICD-10-CM

## 2016-12-23 DIAGNOSIS — R079 Chest pain, unspecified: Secondary | ICD-10-CM

## 2016-12-23 DIAGNOSIS — Z90710 Acquired absence of both cervix and uterus: Secondary | ICD-10-CM

## 2016-12-23 DIAGNOSIS — G4730 Sleep apnea, unspecified: Secondary | ICD-10-CM | POA: Diagnosis present

## 2016-12-23 DIAGNOSIS — R0602 Shortness of breath: Secondary | ICD-10-CM | POA: Diagnosis present

## 2016-12-23 DIAGNOSIS — F329 Major depressive disorder, single episode, unspecified: Secondary | ICD-10-CM | POA: Diagnosis present

## 2016-12-23 DIAGNOSIS — Z91041 Radiographic dye allergy status: Secondary | ICD-10-CM

## 2016-12-23 DIAGNOSIS — Z8249 Family history of ischemic heart disease and other diseases of the circulatory system: Secondary | ICD-10-CM

## 2016-12-23 DIAGNOSIS — I4581 Long QT syndrome: Secondary | ICD-10-CM | POA: Diagnosis not present

## 2016-12-23 DIAGNOSIS — Z91012 Allergy to eggs: Secondary | ICD-10-CM

## 2016-12-23 DIAGNOSIS — D62 Acute posthemorrhagic anemia: Secondary | ICD-10-CM | POA: Diagnosis not present

## 2016-12-23 DIAGNOSIS — I2510 Atherosclerotic heart disease of native coronary artery without angina pectoris: Secondary | ICD-10-CM | POA: Diagnosis present

## 2016-12-23 DIAGNOSIS — Z886 Allergy status to analgesic agent status: Secondary | ICD-10-CM

## 2016-12-23 DIAGNOSIS — Z91013 Allergy to seafood: Secondary | ICD-10-CM

## 2016-12-23 DIAGNOSIS — R002 Palpitations: Secondary | ICD-10-CM

## 2016-12-23 DIAGNOSIS — Z888 Allergy status to other drugs, medicaments and biological substances status: Secondary | ICD-10-CM

## 2016-12-23 DIAGNOSIS — Z825 Family history of asthma and other chronic lower respiratory diseases: Secondary | ICD-10-CM

## 2016-12-23 DIAGNOSIS — Z91011 Allergy to milk products: Secondary | ICD-10-CM

## 2016-12-23 DIAGNOSIS — R634 Abnormal weight loss: Secondary | ICD-10-CM | POA: Diagnosis present

## 2016-12-23 DIAGNOSIS — D509 Iron deficiency anemia, unspecified: Secondary | ICD-10-CM | POA: Diagnosis present

## 2016-12-23 DIAGNOSIS — D649 Anemia, unspecified: Secondary | ICD-10-CM | POA: Diagnosis present

## 2016-12-23 DIAGNOSIS — D500 Iron deficiency anemia secondary to blood loss (chronic): Secondary | ICD-10-CM

## 2016-12-23 LAB — CBC WITH DIFFERENTIAL/PLATELET
BASOS ABS: 0 10*3/uL (ref 0–0.1)
BASOS PCT: 1 %
EOS PCT: 3 %
Eosinophils Absolute: 0.2 10*3/uL (ref 0–0.7)
HCT: 29.3 % — ABNORMAL LOW (ref 35.0–47.0)
Hemoglobin: 9 g/dL — ABNORMAL LOW (ref 12.0–16.0)
Lymphocytes Relative: 44 %
Lymphs Abs: 2.9 10*3/uL (ref 1.0–3.6)
MCH: 21.7 pg — ABNORMAL LOW (ref 26.0–34.0)
MCHC: 30.6 g/dL — ABNORMAL LOW (ref 32.0–36.0)
MCV: 71 fL — ABNORMAL LOW (ref 80.0–100.0)
MONO ABS: 0.5 10*3/uL (ref 0.2–0.9)
Monocytes Relative: 8 %
Neutro Abs: 2.8 10*3/uL (ref 1.4–6.5)
Neutrophils Relative %: 44 %
PLATELETS: 415 10*3/uL (ref 150–440)
RBC: 4.13 MIL/uL (ref 3.80–5.20)
RDW: 20.7 % — AB (ref 11.5–14.5)
WBC: 6.4 10*3/uL (ref 3.6–11.0)

## 2016-12-23 LAB — COMPREHENSIVE METABOLIC PANEL
ALBUMIN: 3.2 g/dL — AB (ref 3.5–5.0)
ALT: 16 U/L (ref 14–54)
AST: 30 U/L (ref 15–41)
Alkaline Phosphatase: 98 U/L (ref 38–126)
Anion gap: 6 (ref 5–15)
BUN: 35 mg/dL — AB (ref 6–20)
CHLORIDE: 107 mmol/L (ref 101–111)
CO2: 25 mmol/L (ref 22–32)
Calcium: 8.5 mg/dL — ABNORMAL LOW (ref 8.9–10.3)
Creatinine, Ser: 1.37 mg/dL — ABNORMAL HIGH (ref 0.44–1.00)
GFR calc Af Amer: 49 mL/min — ABNORMAL LOW (ref >60)
GFR calc non Af Amer: 42 mL/min — ABNORMAL LOW (ref >60)
GLUCOSE: 145 mg/dL — AB (ref 65–99)
POTASSIUM: 4.4 mmol/L (ref 3.5–5.1)
SODIUM: 138 mmol/L (ref 135–145)
Total Bilirubin: 0.6 mg/dL (ref 0.3–1.2)
Total Protein: 6.9 g/dL (ref 6.5–8.1)

## 2016-12-23 LAB — URINALYSIS, COMPLETE (UACMP) WITH MICROSCOPIC
Bilirubin Urine: NEGATIVE
GLUCOSE, UA: NEGATIVE mg/dL
HGB URINE DIPSTICK: NEGATIVE
Ketones, ur: NEGATIVE mg/dL
Leukocytes, UA: NEGATIVE
Nitrite: NEGATIVE
Protein, ur: NEGATIVE mg/dL
SPECIFIC GRAVITY, URINE: 1.018 (ref 1.005–1.030)
pH: 7 (ref 5.0–8.0)

## 2016-12-23 LAB — LACTIC ACID, PLASMA: Lactic Acid, Venous: 1.3 mmol/L (ref 0.5–1.9)

## 2016-12-23 LAB — TROPONIN I: Troponin I: <0.03 ng/mL (ref <0.03)

## 2016-12-23 LAB — TSH: TSH: 1.133 u[IU]/mL (ref 0.350–4.500)

## 2016-12-23 MED ORDER — SUCRALFATE 1 G PO TABS
1.0000 g | ORAL_TABLET | Freq: Three times a day (TID) | ORAL | Status: DC
  Administered 2016-12-23 – 2016-12-26 (×10): 1 g via ORAL
  Filled 2016-12-23 (×10): qty 1

## 2016-12-23 MED ORDER — SODIUM CHLORIDE 0.9% FLUSH
3.0000 mL | Freq: Two times a day (BID) | INTRAVENOUS | Status: DC
  Administered 2016-12-24 – 2016-12-26 (×4): 3 mL via INTRAVENOUS

## 2016-12-23 MED ORDER — LORATADINE 10 MG PO TABS
10.0000 mg | ORAL_TABLET | Freq: Every day | ORAL | Status: DC
  Administered 2016-12-24 – 2016-12-26 (×3): 10 mg via ORAL
  Filled 2016-12-23 (×3): qty 1

## 2016-12-23 MED ORDER — TECHNETIUM TC 99M DIETHYLENETRIAME-PENTAACETIC ACID
30.0000 | Freq: Once | INTRAVENOUS | Status: AC | PRN
  Administered 2016-12-23: 31.808 via INTRAVENOUS

## 2016-12-23 MED ORDER — FLUTICASONE PROPIONATE 50 MCG/ACT NA SUSP
1.0000 | Freq: Two times a day (BID) | NASAL | Status: DC
  Administered 2016-12-23 – 2016-12-26 (×6): 1 via NASAL
  Filled 2016-12-23 (×3): qty 16

## 2016-12-23 MED ORDER — FOLIC ACID 1 MG PO TABS
1.0000 mg | ORAL_TABLET | Freq: Every day | ORAL | Status: DC
  Administered 2016-12-24 – 2016-12-26 (×3): 1 mg via ORAL
  Filled 2016-12-23 (×3): qty 1

## 2016-12-23 MED ORDER — SODIUM CHLORIDE 0.9 % IV BOLUS (SEPSIS)
500.0000 mL | Freq: Once | INTRAVENOUS | Status: AC
Start: 2016-12-23 — End: 2016-12-23
  Administered 2016-12-23: 13:00:00 via INTRAVENOUS

## 2016-12-23 MED ORDER — SODIUM CHLORIDE 0.9 % IV BOLUS (SEPSIS)
1000.0000 mL | Freq: Once | INTRAVENOUS | Status: AC
Start: 2016-12-23 — End: 2016-12-23
  Administered 2016-12-23: 1000 mL via INTRAVENOUS

## 2016-12-23 MED ORDER — CLOPIDOGREL BISULFATE 75 MG PO TABS
75.0000 mg | ORAL_TABLET | Freq: Every day | ORAL | Status: DC
  Administered 2016-12-24: 75 mg via ORAL
  Filled 2016-12-23: qty 1

## 2016-12-23 MED ORDER — FENTANYL 50 MCG/HR TD PT72
50.0000 ug | MEDICATED_PATCH | TRANSDERMAL | Status: DC
  Administered 2016-12-24: 50 ug via TRANSDERMAL
  Filled 2016-12-23: qty 1

## 2016-12-23 MED ORDER — ZOLPIDEM TARTRATE 5 MG PO TABS
5.0000 mg | ORAL_TABLET | Freq: Every evening | ORAL | Status: DC | PRN
  Administered 2016-12-24 – 2016-12-25 (×2): 5 mg via ORAL
  Filled 2016-12-23 (×2): qty 1

## 2016-12-23 MED ORDER — TECHNETIUM TO 99M ALBUMIN AGGREGATED
4.0000 | Freq: Once | INTRAVENOUS | Status: AC | PRN
  Administered 2016-12-23: 3.657 via INTRAVENOUS

## 2016-12-23 MED ORDER — METOPROLOL SUCCINATE ER 100 MG PO TB24: 100.0000 mg | ORAL_TABLET | Freq: Every day | ORAL | Status: DC

## 2016-12-23 MED ORDER — SODIUM CHLORIDE 0.9 % IV BOLUS (SEPSIS)
1000.0000 mL | Freq: Once | INTRAVENOUS | Status: AC
  Administered 2016-12-23: 1000 mL via INTRAVENOUS

## 2016-12-23 MED ORDER — CLONIDINE HCL 0.1 MG PO TABS: 0.1000 mg | ORAL_TABLET | Freq: Every day | ORAL | Status: DC

## 2016-12-23 MED ORDER — SODIUM CHLORIDE 0.9 % IV SOLN
INTRAVENOUS | Status: DC
  Administered 2016-12-23 – 2016-12-24 (×2): via INTRAVENOUS

## 2016-12-23 MED ORDER — ONDANSETRON HCL 4 MG/2ML IJ SOLN: 4.0000 mg | Freq: Four times a day (QID) | INTRAMUSCULAR | Status: DC | PRN

## 2016-12-23 MED ORDER — MIRTAZAPINE 15 MG PO TABS
45.0000 mg | ORAL_TABLET | Freq: Every day | ORAL | Status: DC
  Administered 2016-12-23 – 2016-12-25 (×3): 45 mg via ORAL
  Filled 2016-12-23 (×3): qty 3

## 2016-12-23 MED ORDER — LORAZEPAM 2 MG/ML IJ SOLN
0.5000 mg | Freq: Once | INTRAMUSCULAR | Status: AC
  Administered 2016-12-23: 0.5 mg via INTRAVENOUS

## 2016-12-23 MED ORDER — IPRATROPIUM-ALBUTEROL 0.5-2.5 (3) MG/3ML IN SOLN
3.0000 mL | Freq: Once | RESPIRATORY_TRACT | Status: AC
  Administered 2016-12-23: 3 mL via RESPIRATORY_TRACT
  Filled 2016-12-23: qty 3

## 2016-12-23 MED ORDER — ALPRAZOLAM 0.5 MG PO TABS: 0.5000 mg | ORAL_TABLET | Freq: Once | ORAL | Status: DC

## 2016-12-23 MED ORDER — OXYBUTYNIN CHLORIDE 5 MG PO TABS
5.0000 mg | ORAL_TABLET | Freq: Two times a day (BID) | ORAL | Status: DC
  Administered 2016-12-23 – 2016-12-26 (×6): 5 mg via ORAL
  Filled 2016-12-23 (×6): qty 1

## 2016-12-23 MED ORDER — VENLAFAXINE HCL ER 150 MG PO CP24
150.0000 mg | ORAL_CAPSULE | Freq: Every day | ORAL | Status: DC
  Administered 2016-12-24 – 2016-12-26 (×3): 150 mg via ORAL
  Filled 2016-12-23 (×2): qty 1
  Filled 2016-12-23: qty 2

## 2016-12-23 MED ORDER — POTASSIUM CHLORIDE CRYS ER 10 MEQ PO TBCR
10.0000 meq | EXTENDED_RELEASE_TABLET | Freq: Every day | ORAL | Status: DC
  Administered 2016-12-24 – 2016-12-26 (×3): 10 meq via ORAL
  Filled 2016-12-23 (×4): qty 1

## 2016-12-23 MED ORDER — ACETAMINOPHEN 325 MG PO TABS: 650.0000 mg | ORAL_TABLET | Freq: Four times a day (QID) | ORAL | Status: DC | PRN

## 2016-12-23 MED ORDER — ALBUTEROL SULFATE (2.5 MG/3ML) 0.083% IN NEBU: 2.5000 mg | INHALATION_SOLUTION | Freq: Four times a day (QID) | RESPIRATORY_TRACT | Status: DC | PRN

## 2016-12-23 MED ORDER — MORPHINE SULFATE (PF) 2 MG/ML IV SOLN: 2.0000 mg | Freq: Once | INTRAVENOUS | Status: AC

## 2016-12-23 MED ORDER — PANTOPRAZOLE SODIUM 40 MG PO TBEC
40.0000 mg | DELAYED_RELEASE_TABLET | Freq: Every day | ORAL | Status: DC
  Administered 2016-12-24: 40 mg via ORAL
  Filled 2016-12-23: qty 1

## 2016-12-23 MED ORDER — ONDANSETRON HCL 4 MG PO TABS: 4.0000 mg | ORAL_TABLET | Freq: Four times a day (QID) | ORAL | Status: DC | PRN

## 2016-12-23 MED ORDER — OXYCODONE HCL 5 MG PO TABS
5.0000 mg | ORAL_TABLET | Freq: Four times a day (QID) | ORAL | Status: DC | PRN
  Administered 2016-12-23 – 2016-12-26 (×9): 5 mg via ORAL
  Filled 2016-12-23 (×9): qty 1

## 2016-12-23 MED ORDER — ALPRAZOLAM 0.5 MG PO TABS
1.5000 mg | ORAL_TABLET | Freq: Every day | ORAL | Status: DC
  Administered 2016-12-23 – 2016-12-25 (×3): 1.5 mg via ORAL
  Filled 2016-12-23: qty 3
  Filled 2016-12-23 (×2): qty 1

## 2016-12-23 MED ORDER — HYDROMORPHONE BOLUS VIA INFUSION
1.0000 mg | Freq: Once | INTRAVENOUS | Status: DC
Start: 2016-12-23 — End: 2016-12-23

## 2016-12-23 MED ORDER — ACETAMINOPHEN 650 MG RE SUPP: 650.0000 mg | Freq: Four times a day (QID) | RECTAL | Status: DC | PRN

## 2016-12-23 MED ORDER — ALBUTEROL SULFATE (2.5 MG/3ML) 0.083% IN NEBU
5.0000 mg | INHALATION_SOLUTION | Freq: Once | RESPIRATORY_TRACT | Status: AC
  Administered 2016-12-23: 5 mg via RESPIRATORY_TRACT
  Filled 2016-12-23: qty 6

## 2016-12-23 MED ORDER — NITROGLYCERIN 2 % TD OINT
0.5000 [in_us] | TOPICAL_OINTMENT | Freq: Three times a day (TID) | TRANSDERMAL | Status: DC
  Administered 2016-12-23 – 2016-12-24 (×2): 0.5 [in_us] via TOPICAL
  Filled 2016-12-23 (×2): qty 1

## 2016-12-23 MED ORDER — HYDROMORPHONE HCL 1 MG/ML IJ SOLN
1.0000 mg | Freq: Once | INTRAMUSCULAR | Status: AC
  Administered 2016-12-23: 1 mg via INTRAVENOUS
  Filled 2016-12-23: qty 1

## 2016-12-23 MED ORDER — ALPRAZOLAM 0.5 MG PO TABS
1.0000 mg | ORAL_TABLET | Freq: Every morning | ORAL | Status: DC
  Administered 2016-12-24 – 2016-12-26 (×3): 1 mg via ORAL
  Filled 2016-12-23: qty 1
  Filled 2016-12-23 (×2): qty 2

## 2016-12-23 MED ORDER — ALPRAZOLAM 1 MG PO TABS: 1.0000 mg | ORAL_TABLET | Freq: Two times a day (BID) | ORAL | Status: DC

## 2016-12-23 MED ORDER — CALCIUM LACTATE 648 MG PO TABS: 648.0000 mg | ORAL_TABLET | Freq: Two times a day (BID) | ORAL | Status: DC

## 2016-12-23 MED ORDER — OXYCODONE-ACETAMINOPHEN 5-325 MG PO TABS
1.0000 | ORAL_TABLET | Freq: Once | ORAL | Status: AC
  Administered 2016-12-23: 1 via ORAL
  Filled 2016-12-23: qty 1

## 2016-12-23 MED ORDER — MORPHINE SULFATE (PF) 2 MG/ML IV SOLN
INTRAVENOUS | Status: AC
  Filled 2016-12-23: qty 1

## 2016-12-23 MED ORDER — LORAZEPAM 2 MG/ML IJ SOLN
INTRAMUSCULAR | Status: AC
  Administered 2016-12-23: 0.5 mg via INTRAVENOUS
  Filled 2016-12-23: qty 1

## 2016-12-23 MED ORDER — BACLOFEN 10 MG PO TABS
10.0000 mg | ORAL_TABLET | Freq: Three times a day (TID) | ORAL | Status: DC
  Administered 2016-12-23 – 2016-12-26 (×8): 10 mg via ORAL
  Filled 2016-12-23 (×10): qty 1

## 2016-12-23 MED ORDER — HYDROXYZINE HCL 25 MG PO TABS
25.0000 mg | ORAL_TABLET | Freq: Three times a day (TID) | ORAL | Status: DC
  Administered 2016-12-23 – 2016-12-26 (×8): 25 mg via ORAL
  Filled 2016-12-23 (×10): qty 1

## 2016-12-23 MED ORDER — ENOXAPARIN SODIUM 40 MG/0.4ML ~~LOC~~ SOLN
40.0000 mg | Freq: Two times a day (BID) | SUBCUTANEOUS | Status: DC
  Administered 2016-12-23 – 2016-12-24 (×3): 40 mg via SUBCUTANEOUS
  Filled 2016-12-23 (×4): qty 0.4

## 2016-12-23 NOTE — ED Notes (Signed)
RN Lovena Le notified about VQ scan and to call them for follow up.

## 2016-12-23 NOTE — ED Notes (Signed)
Ambulated pt in room, hr up to 130's, Sinus tachy,  RR 30's and sats down to 91%.  Pt states she became weak and tired after walking for several minutes.  Once pt sat back into bed, pt sats up to 97% on RA.  HR down to 80 and RR 20.  Pt denied pain during ambulation.  SR on monitor at this time.

## 2016-12-23 NOTE — ED Triage Notes (Signed)
Pt presents to ED via [facility]. Per [facility] patient "had sudden onset of not feeling good, complaining of feeling faint and all over pain". Pt states chronic generalized body aches. Per EMS upon arrival to patient's house, HR was 140, prior to arrival to ER pt's HR was in the low "100's". Per EMS and patient, patient was admitted approx 1 month ago for the same thing. Pt is alert and oriented, c/o being cold.

## 2016-12-23 NOTE — ED Provider Notes (Signed)
[facility] Emergency Department Provider Note    First MD Initiated Contact with Patient 12/23/16 1035     (approximate)  I have reviewed the triage vital signs and the nursing notes.   HISTORY  Chief Complaint Weakness and Tachycardia    HPI Lindsey [name] is a 56 y.o. female multiple comorbidities presents with sudden onset of July's malaise lightheadedness nausea and body aches while the patient was watching TV this morning. States that she also is having worsening shortness of breath. The onset of symptoms was right around 9 AM. She told her grandson to call EMS because she was worried she is given a faint. Upon EMS arrival patient's heart rate was in the 140s but was sinus. She was otherwise feeling better by time EMS arrived. She is brought to the ER. At this point she states that she does have a generalized mild discomfort but no significant chest pain. Denies any nausea. States that her appetite has been poor. No recent changes to her medications. Denies any melena or hematochezia. Denies any dysuria.  No recent fevers.  Note the patient was admitted for a syncopal event and epigastric pain last month. I'm overseeing this patient at that time and at that time she appeared much worse than she does now.  She is observed overnight with IV fluids. Echo was normal. No dysrhythmias noted.   Past Medical History:  Diagnosis Date  . Allergy   . COPD (chronic obstructive pulmonary disease) ([place])   . Diabetes mellitus without complication ([place])   . GERD (gastroesophageal reflux disease)   . Heart attack   . Hyperlipidemia   . Hypertension   . PUD (peptic ulcer disease)   . Sleep apnea   . Stroke [facility])    Family History  Problem Relation Age of Onset  . Hypertension Father   . COPD Father   . Heart disease Father   . Breast cancer Neg Hx    Past Surgical History:  Procedure Laterality Date  . ABDOMINAL HYSTERECTOMY    . back surgery    . broken  wrist    . ESOPHAGOGASTRODUODENOSCOPY (EGD) WITH PROPOFOL N/A 09/28/2016   Procedure: ESOPHAGOGASTRODUODENOSCOPY (EGD) WITH PROPOFOL;  Surgeon: Manya Silvas, MD;  Location: [facility] ENDOSCOPY;  Service: Endoscopy;  Laterality: N/A;  . GALLBLADDER SURGERY    . GASTRIC BYPASS    . HERNIA REPAIR    . REPLACEMENT TOTAL KNEE    . ROTATOR CUFF REPAIR     Patient Active Problem List   Diagnosis Date Noted  . Hypotension 09/29/2016  . Renal insufficiency 09/29/2016  . Anemia 09/29/2016  . Leukocytosis 09/29/2016  . Prediabetes 09/29/2016  . Jejunal inflammation 09/29/2016  . Abnormal findings on esophagogastroduodenoscopy (EGD) 09/29/2016  . Vitamin D deficiency 09/29/2016  . Syncope 09/27/2016  . Seizures ([place]) 09/02/2015  . 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 Disease of female genital organs 04/01/2013  . Difficult or painful urination 03/18/2013  . Urge incontinence 03/18/2013  . Urgency of micturation 03/18/2013      Prior to Admission medications   Medication Sig Start Date End Date Taking? Authorizing Provider  albuterol (PROVENTIL HFA;VENTOLIN HFA) 108 (90 BASE) MCG/ACT inhaler Inhale 2 puffs into the lungs every 4 (four) hours as needed for wheezing or shortness of breath.    Historical Provider, MD  albuterol (PROVENTIL) (2.5 MG/3ML) 0.083% nebulizer solution Take 2.5 mg by nebulization every 6 (six) hours as needed for wheezing or  shortness of breath.    Historical Provider, MD  ALPRAZolam Duanne Moron) 1 MG tablet Take 1-1.5 mg by mouth 2 (two) times daily. Pt takes one tablet in the morning and one and one-half tablet at night.    Historical Provider, MD  baclofen (LIORESAL) 10 MG tablet Take 10 mg by mouth 3 (three) times daily.    Historical Provider, MD  benzonatate (TESSALON) 100 MG capsule Take 100 mg by mouth 3 (three) times daily as needed for cough.    Historical Provider, MD  Calcium Lactate 648 MG TABS Take 648 mg by mouth 2 (two)  times daily.    Historical Provider, MD  clopidogrel (PLAVIX) 75 MG tablet Take 1 tablet (75 mg total) by mouth daily. 09/01/15   Bettey Costa, MD  desvenlafaxine (PRISTIQ) 100 MG 24 hr tablet Take 100 mg by mouth daily. Pt takes 100 mg tab and 50 mg tab for total of 150 mg    Historical Provider, MD  desvenlafaxine (PRISTIQ) 50 MG 24 hr tablet Take 50 mg by mouth daily. Pt takes 100 mg tab and 50 mg tab for total of 150 mg    Historical Provider, MD  esomeprazole (NEXIUM) 40 MG capsule Take 40 mg by mouth 2 (two) times daily.    Historical Provider, MD  fentaNYL (DURAGESIC - DOSED MCG/HR) 50 MCG/HR Place 50 mcg onto the skin every 3 (three) days.    Historical Provider, MD  fexofenadine (ALLEGRA) 60 MG tablet Take 60 mg by mouth 2 (two) times daily.    Historical Provider, MD  fluticasone (FLONASE) 50 MCG/ACT nasal spray Place 1 spray into both nostrils 2 (two) times daily.    Historical Provider, MD  folic acid (FOLVITE) 1 MG tablet Take 1 mg by mouth daily.    Historical Provider, MD  hydrOXYzine (ATARAX/VISTARIL) 25 MG tablet Take 25 mg by mouth 3 (three) times daily.    Historical Provider, MD  l-methylfolate-B6-B12 (METANX) 3-35-2 MG TABS tablet Take 1 tablet by mouth 2 (two) times daily.    Historical Provider, MD  mirtazapine (REMERON) 45 MG tablet Take 45 mg by mouth at bedtime.    Historical Provider, MD  ONGLYZA 5 MG TABS tablet Take 1 tablet by mouth daily. 08/28/16   Historical Provider, MD  oxybutynin (DITROPAN) 5 MG tablet Take 5 mg by mouth 2 (two) times daily.    Historical Provider, MD  oxyCODONE (OXY IR/ROXICODONE) 5 MG immediate release tablet Take 5 mg by mouth every 6 (six) hours as needed for severe pain.    Historical Provider, MD  potassium chloride (K-DUR) 10 MEQ tablet Take 10 mEq by mouth daily.    Historical Provider, MD  simvastatin (ZOCOR) 40 MG tablet Take 1 tablet by mouth daily. 09/24/16   Historical Provider, MD  sucralfate (CARAFATE) 1 g tablet Take 1 tablet (1 g  total) by mouth 4 (four) times daily -  with meals and at bedtime. 09/29/16   Theodoro Grist, MD  Vitamin D, Ergocalciferol, (DRISDOL) 50000 units CAPS capsule Take 1 capsule (50,000 Units total) by mouth every 7 (seven) days. 10/05/16   Theodoro Grist, MD  zolpidem (AMBIEN) 10 MG tablet Take 10 mg by mouth at bedtime as needed for sleep.    Historical Provider, MD    Allergies Morphine; Iodinated diagnostic agents; Aspirin; Eggs or egg-derived products; Etodolac; Ibuprofen; Milk-related compounds; Shellfish allergy; and Tylenol [acetaminophen]    Social History Social History  Substance Use Topics  . Smoking status: Never Smoker  . Smokeless  tobacco: Never Used  . Alcohol use No    Review of Systems Patient denies headaches, rhinorrhea, blurry vision, numbness, shortness of breath, chest pain, edema, cough, abdominal pain, nausea, vomiting, diarrhea, dysuria, fevers, rashes or hallucinations unless otherwise stated above in HPI. ____________________________________________   PHYSICAL EXAM:  VITAL SIGNS: Vitals:   12/23/16 1044  BP: 107/83  Pulse: 96  Resp: 20  Temp: 98.2 F (36.8 C)    Constitutional: Alert and oriented. Obese but in no acute distress. Eyes: Conjunctivae are normal. PERRL. EOMI. Head: Atraumatic. Nose: No congestion/rhinnorhea. Mouth/Throat: Mucous membranes are dry, edentulous.  Oropharynx non-erythematous. Neck: No stridor. Painless ROM. No cervical spine tenderness to palpation Hematological/Lymphatic/Immunilogical: No cervical lymphadenopathy. Cardiovascular: Normal rate, regular rhythm. Grossly normal heart sounds.  Good peripheral circulation. Respiratory: Normal respiratory effort.  No retractions. Lungs CTAB. Gastrointestinal: Soft and nontender. No distention. No abdominal bruits. No CVA tenderness. Musculoskeletal: No lower extremity tenderness nor edema.  No joint effusions. Neurologic:  Normal speech and language. No gross focal neurologic  deficits are appreciated. No gait instability. Skin:  Skin is warm, dry and intact. No rash noted. Psychiatric: Mood and affect are normal. Speech and behavior are normal.  ____________________________________________   LABS (all labs ordered are listed, but only abnormal results are displayed)  Results for orders placed or performed during the hospital encounter of 12/23/16 (from the past 24 hour(s))  CBC with Differential/Platelet     Status: Abnormal   Collection Time: 12/23/16 10:43 AM  Result Value Ref Range   WBC 6.4 3.6 - 11.0 K/uL   RBC 4.13 3.80 - 5.20 MIL/uL   Hemoglobin 9.0 (L) 12.0 - 16.0 g/dL   HCT 29.3 (L) 35.0 - 47.0 %   MCV 71.0 (L) 80.0 - 100.0 fL   MCH 21.7 (L) 26.0 - 34.0 pg   MCHC 30.6 (L) 32.0 - 36.0 g/dL   RDW 20.7 (H) 11.5 - 14.5 %   Platelets 415 150 - 440 K/uL   Neutrophils Relative % 44 %   Neutro Abs 2.8 1.4 - 6.5 K/uL   Lymphocytes Relative 44 %   Lymphs Abs 2.9 1.0 - 3.6 K/uL   Monocytes Relative 8 %   Monocytes Absolute 0.5 0.2 - 0.9 K/uL   Eosinophils Relative 3 %   Eosinophils Absolute 0.2 0 - 0.7 K/uL   Basophils Relative 1 %   Basophils Absolute 0.0 0 - 0.1 K/uL   ____________________________________________  EKG My review and personal interpretation at Time: 10:34   Indication: weakness  Rate: 100  Rhythm: sinus Axis: normal Other: non specific st changes, borderline prolonged QT otherwise normal intervals ____________________________________________  RADIOLOGY  I personally reviewed all radiographic images ordered to evaluate for the above acute complaints and reviewed radiology reports and findings.  These findings were personally discussed with the patient.  Please see medical record for radiology report.  ____________________________________________   PROCEDURES  Procedure(s) performed:  Procedures    Critical Care performed: no ____________________________________________   INITIAL IMPRESSION / ASSESSMENT AND PLAN / ED  COURSE  Pertinent labs & imaging results that were available during my care of the patient were reviewed by me and considered in my medical decision making (see chart for details).  DDX: dehydration, acs, dysrhtymia, electrolye abn, anemia, sepsis  Lindsey [name] is a 56 y.o. who presents to the ED with  the above complaints.  Patient is AFVSS in ED. Exam as above. Given current presentation have considered the above differential.  On arrival the patient is  otherwise hemodynamically stable and nontoxic appearing. Patient does however have multiple comorbidities therefore do feel it prudent to further evaluate with laboratory evaluation EKG and radiographs. She does have a dry oropharynx and therefore I do suspect some component of dehydration to explain her tachycardia. Less likely any sort of withdrawal. Her abdominal exam is soft and benign. Lower suspicion for any septic process. She has normal breath sounds and no hypoxia.  The patient will be placed on continuous pulse oximetry and telemetry for monitoring.  Laboratory evaluation will be sent to evaluate for the above complaints.     Clinical Course as of Dec 23 1502  Sat Dec 23, 2016  1138 Trop negative.  Will repeat 3 hrs trop to further evaluate.  [PR]  1215 Cauda bedside and patient with heart rate to 150s. Does appear to be a sinus tachycardia. Patient states feels similar to previous discomfort. Will repeat troponin and provide IV fluids.  [PR]  1304 Patient with tachycardia resolved. Blood gas is reassuring. Patient without any instability at this point. Possible component of underlying anxiety attack. I also considered sick sinus syndrome though the patient's not have any significant bradycardic episodes and has a normal and consistent P waves. Possibly secondary to dehydration therefore will continue IV fluids.  [PR]  5520 Went to recheck the patient. Patient with mild tachycardia but otherwise well-perfused. She denies any shortness  of breath or chest pain right now. States that she is having her chronic back pain and she has not taken any pain medications for this. Based on her presentation I did recommend admission the hospital for IV fluids and further hemodynamic monitoring.  Patient states that she wants to go home. I have encouraged the patient to stay for an additional period of monitoring and IV fluids.  [PR]  1413 Patient persistently elevated heart rate and hypotension despite IV fluids. Patient remains nontoxic appearing but based on her comorbidities and complaint of chest pain and do feel that she would benefit from further evaluation and monitoring in the hospital.  Possible sick sinus syndrome or medication effect.  Have discussed with the patient and available family all diagnostics and treatments performed thus far and all questions were answered to the best of my ability. The patient demonstrates understanding and agreement with plan. .  [PR]    Clinical Course User Index [PR] Merlyn Lot, MD     ____________________________________________   FINAL CLINICAL IMPRESSION(S) / ED DIAGNOSES  Final diagnoses:  Hypotension, unspecified hypotension type  Tachycardia  Chest pain, unspecified type      NEW MEDICATIONS STARTED DURING THIS VISIT:  New Prescriptions   No medications on file     Note:  This document was prepared using Dragon voice recognition software and may include unintentional dictation errors.    Merlyn Lot, MD 12/23/16 [phone]

## 2016-12-23 NOTE — ED Notes (Signed)
NM called this nurse and asked if this pt would be able to lie flat for an hour for scan. Pt was asked and stated she "has chronic back pain and not able to do that." MD notified to see if pain medicine could help pt get the test completed.

## 2016-12-23 NOTE — ED Notes (Signed)
MD notified that Lindsey Pina, RN went to adm the pain medication morphine however pt is allergic. MD notified about this and that pt is going to floor. NM notified as well.

## 2016-12-23 NOTE — H&P (Addendum)
Lindsey [name] at [place] NAME: Lindsey [name]    MR#:  [id number]  DATE OF BIRTH:  [birth year]  DATE OF ADMISSION:  12/23/2016  PRIMARY CARE PHYSICIAN: Volanda Napoleon, MD   REQUESTING/REFERRING PHYSICIAN:   CHIEF COMPLAINT:   Chief Complaint  Patient presents with  . Weakness  . Tachycardia    HISTORY OF PRESENT ILLNESS: Lindsey [name]  is a 56 y.o. female with a known history of Admission in November 2017 for syncope, which was felt to be due to dehydration, poor oral intake, at that time. 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 I pointed the patient, she was sitting and watching TV today in the morning when suddenly she became somewhat hot in the chest, noted that her heart was palpitating, she was also very short of breath. She also noted chest pain in the left side of the chest which was described as chest pressure 910 by intensity, intermittent discomfort radiating to the left neck area. There was no significant improvement with any movements or position, patient did however noted that this pain increased whenever she took a deep breath whenever she walked around . She felt presyncopal. She called her grandson and asked him to call 911 because she was worried that she may pass out. On EMS arrival, her heart rate was 150. She was noted to be somewhat hypotensive and was brought to emergency room for further evaluation and treatment, where she received a few liters of IV fluids with some improvement of her heart rate to 101. She denies any significant shortness of breath is present and her O2 sats are 95%. Hospitalist services were contacted for admission  PAST MEDICAL HISTORY:   Past Medical History:  Diagnosis Date   . Allergy   . COPD (chronic obstructive pulmonary disease) ([place])   . Diabetes mellitus without complication ([place])   . GERD (gastroesophageal reflux disease)   . Heart attack   . Hyperlipidemia   . Hypertension   . PUD (peptic ulcer disease)   . Sleep apnea   . Stroke [facility])     PAST SURGICAL HISTORY: Past Surgical History:  Procedure Laterality Date  . ABDOMINAL HYSTERECTOMY    . back surgery    . broken wrist    . ESOPHAGOGASTRODUODENOSCOPY (EGD) WITH PROPOFOL N/A 09/28/2016   Procedure: ESOPHAGOGASTRODUODENOSCOPY (EGD) WITH PROPOFOL;  Surgeon: Manya Silvas, MD;  Location: [facility] ENDOSCOPY;  Service: Endoscopy;  Laterality: N/A;  . GALLBLADDER SURGERY    . GASTRIC BYPASS    . HERNIA REPAIR    . REPLACEMENT TOTAL KNEE    . ROTATOR CUFF REPAIR      SOCIAL HISTORY:  Social History  Substance Use Topics  . Smoking status: Never Smoker  . Smokeless tobacco: Never Used  . Alcohol use No    FAMILY HISTORY:  Family History  Problem Relation Age of Onset  . Hypertension Father   . COPD Father   . Heart disease Father   . Breast cancer Neg Hx     DRUG ALLERGIES:  Allergies  Allergen Reactions  . Morphine Hives  . Iodinated Diagnostic Agents Hives  . Aspirin Hives    Noted on MD progress notes and discussed with MD 09/02/15  . Eggs Or Egg-Derived Products Itching  .  Etodolac Hives  . Ibuprofen Hives  . Milk-Related Compounds Itching  . Shellfish Allergy Hives  . Tylenol [Acetaminophen]     Review of Systems  Constitutional: Positive for weight loss. Negative for chills and fever.  HENT: Negative for congestion.   Eyes: Negative for blurred vision and double vision.  Respiratory: Positive for shortness of breath. Negative for cough, sputum production and wheezing.   Cardiovascular: Positive for chest pain, palpitations and leg swelling. Negative for orthopnea and PND.  Gastrointestinal: Negative for abdominal pain, blood in stool, constipation, diarrhea, nausea  and vomiting.  Genitourinary: Negative for dysuria, frequency, hematuria and urgency.  Musculoskeletal: Negative for falls.  Neurological: Negative for dizziness, tremors, focal weakness and headaches.  Endo/Heme/Allergies: Does not bruise/bleed easily.  Psychiatric/Behavioral: Negative for depression. The patient does not have insomnia.     MEDICATIONS AT HOME:  Prior to Admission medications   Medication Sig Start Date End Date Taking? Authorizing Provider  albuterol (PROVENTIL HFA;VENTOLIN HFA) 108 (90 BASE) MCG/ACT inhaler Inhale 2 puffs into the lungs every 4 (four) hours as needed for wheezing or shortness of breath.   Yes Historical Provider, MD  albuterol (PROVENTIL) (2.5 MG/3ML) 0.083% nebulizer solution Take 2.5 mg by nebulization every 6 (six) hours as needed for wheezing or shortness of breath.   Yes Historical Provider, MD  ALPRAZolam Duanne Moron) 1 MG tablet Take 1-1.5 mg by mouth 2 (two) times daily. Pt takes one tablet in the morning and one and one-half tablet at night.   Yes Historical Provider, MD  baclofen (LIORESAL) 10 MG tablet Take 10 mg by mouth 3 (three) times daily.   Yes Historical Provider, MD  Calcium Lactate 648 MG TABS Take 648 mg by mouth 2 (two) times daily.   Yes Historical Provider, MD  cloNIDine (CATAPRES) 0.1 MG tablet Take 0.1 mg by mouth at bedtime.    Yes Historical Provider, MD  clopidogrel (PLAVIX) 75 MG tablet Take 1 tablet (75 mg total) by mouth daily. 09/01/15  Yes Sital Mody, MD  desvenlafaxine (PRISTIQ) 100 MG 24 hr tablet Take 100 mg by mouth daily. Pt takes 100 mg tab and 50 mg tab for total of 150 mg   Yes Historical Provider, MD  desvenlafaxine (PRISTIQ) 50 MG 24 hr tablet Take 50 mg by mouth daily. Pt takes 100 mg tab and 50 mg tab for total of 150 mg   Yes Historical Provider, MD  esomeprazole (NEXIUM) 40 MG capsule Take 40 mg by mouth 2 (two) times daily.   Yes Historical Provider, MD  fentaNYL (DURAGESIC - DOSED MCG/HR) 50 MCG/HR Place 50 mcg onto  the skin every 3 (three) days.   Yes Historical Provider, MD  fexofenadine (ALLEGRA) 60 MG tablet Take 60 mg by mouth 2 (two) times daily.   Yes Historical Provider, MD  fluticasone (FLONASE) 50 MCG/ACT nasal spray Place 1 spray into both nostrils 2 (two) times daily.   Yes Historical Provider, MD  folic acid (FOLVITE) 1 MG tablet Take 1 mg by mouth daily.   Yes Historical Provider, MD  hydrOXYzine (ATARAX/VISTARIL) 25 MG tablet Take 25 mg by mouth 3 (three) times daily.   Yes Historical Provider, MD  l-methylfolate-B6-B12 (METANX) 3-35-2 MG TABS tablet Take 1 tablet by mouth 2 (two) times daily.   Yes Historical Provider, MD  metoprolol succinate (TOPROL-XL) 100 MG 24 hr tablet Take 100 mg by mouth daily. Take with or immediately following a meal.   Yes Historical Provider, MD  mirtazapine (REMERON) 45 MG tablet Take  45 mg by mouth at bedtime.   Yes Historical Provider, MD  olmesartan-hydrochlorothiazide (BENICAR HCT) 40-12.5 MG tablet Take 1 tablet by mouth daily.   Yes Historical Provider, MD  oxybutynin (DITROPAN) 5 MG tablet Take 5 mg by mouth 2 (two) times daily.   Yes Historical Provider, MD  oxyCODONE (OXY IR/ROXICODONE) 5 MG immediate release tablet Take 5 mg by mouth every 6 (six) hours as needed for severe pain.   Yes Historical Provider, MD  potassium chloride (K-DUR) 10 MEQ tablet Take 10 mEq by mouth daily.   Yes Historical Provider, MD  simvastatin (ZOCOR) 40 MG tablet Take 1 tablet by mouth daily. 09/24/16  Yes Historical Provider, MD  sucralfate (CARAFATE) 1 g tablet Take 1 tablet (1 g total) by mouth 4 (four) times daily -  with meals and at bedtime. 09/29/16  Yes Theodoro Grist, MD  zolpidem (AMBIEN) 10 MG tablet Take 10 mg by mouth at bedtime as needed for sleep.   Yes Historical Provider, MD  ONGLYZA 5 MG TABS tablet Take 1 tablet by mouth daily. 08/28/16   Historical Provider, MD  Vitamin D, Ergocalciferol, (DRISDOL) 50000 units CAPS capsule Take 1 capsule (50,000 Units total) by  mouth every 7 (seven) days. Patient not taking: Reported on 12/23/2016 10/05/16   Theodoro Grist, MD      PHYSICAL EXAMINATION:   VITAL SIGNS: Blood pressure 136/69, pulse 99, temperature 98.2 F (36.8 C), temperature source Oral, resp. rate (!) 22, height 5\' 7"  (1.702 m), weight 131.5 kg (290 lb), SpO2 100 %.  GENERAL:  56 y.o.-year-old patient lying in the bed with no acute distress. Edentulous, comfortable on the stretcher, no shortness of breath on exertion while walking to the bathroom from the bed EYES: Pupils equal, round, reactive to light and accommodation. No scleral icterus. Extraocular muscles intact.  HEENT: Head atraumatic, normocephalic. Oropharynx and nasopharynx clear.  NECK:  Supple, no jugular venous distention. No thyroid enlargement, no tenderness.  LUNGS: Normal breath sounds bilaterally, no wheezing, rales,rhonchi or crepitation. No use of accessory muscles of respiration. Painful palpation of the chest on the left side, somewhat painful with deep inspirations as well CARDIOVASCULAR: S1, S2 normal. No murmurs, rubs, or gallops.  ABDOMEN: Soft, nontender, nondistended. Bowel sounds present. No organomegaly or mass.  EXTREMITIES: 1+ lower extremity and pedal edema bilaterally, no cyanosis, or clubbing. Peripheral pulses are 2+ NEUROLOGIC: Cranial nerves II through XII are intact. Muscle strength 5/5 in all extremities. Sensation intact. Gait not checked.  PSYCHIATRIC: The patient is alert and oriented x 3.  SKIN: No obvious rash, lesion, or ulcer.   LABORATORY PANEL:   CBC  Recent Labs Lab 12/23/16 1043  WBC 6.4  HGB 9.0*  HCT 29.3*  PLT 415  MCV 71.0*  MCH 21.7*  MCHC 30.6*  RDW 20.7*  LYMPHSABS 2.9  MONOABS 0.5  EOSABS 0.2  BASOSABS 0.0   ------------------------------------------------------------------------------------------------------------------  Chemistries   Recent Labs Lab 12/23/16 1043  NA 138  K 4.4  CL 107  CO2 25  GLUCOSE 145*   BUN 35*  CREATININE 1.37*  CALCIUM 8.5*  AST 30  ALT 16  ALKPHOS 98  BILITOT 0.6   ------------------------------------------------------------------------------------------------------------------  Cardiac Enzymes  Recent Labs Lab 12/23/16 1043 12/23/16 1220  TROPONINI <0.03 <0.03   ------------------------------------------------------------------------------------------------------------------  RADIOLOGY: Dg Chest 2 View  Result Date: 12/23/2016 CLINICAL DATA:  Generalized body aches and pain. Tachycardia and weakness. EXAM: CHEST  2 VIEW COMPARISON:  09/27/2016 and prior exams FINDINGS: Upper limits normal heart  size and mild peribronchial thickening again noted. There is no evidence of focal airspace disease, pulmonary edema, suspicious pulmonary nodule/mass, pleural effusion, or pneumothorax. No acute bony abnormalities are identified. A thoracic stimulator is again noted. IMPRESSION: No active cardiopulmonary disease. Electronically Signed   By: Margarette Canada M.D.   On: 12/23/2016 11:09    EKG: Orders placed or performed during the hospital encounter of 12/23/16  . EKG 12-Lead  . EKG 12-Lead  . EKG 12-Lead  . EKG 12-Lead   Age. In emergency room reveals sinus tachycardia at rate of 147 bpm, normal axis, nonspecific ST-T changes. EKG prior to this showed prolonged QTc interval to 518 milliseconds   IMPRESSION AND PLAN:  Active Problems:   Chest pain   Dyspnea   Palpitations  #1. Chest pain, dentition medical floor, continue metoprolol, Plavix, nitroglycerin, Lovenox, get cardiologist involved for further recommendations #2. Palpitations of unclear etiology, get TSH, VQ scan, may need to be worked up for endocrinologic pathology such as pheochromocytoma, although patient's blood pressure was low #3. Dyspnea, get V/Q, patient is allergic to the dye, unable to get CT angiogram of the chest #4. Hypotension, continue IV fluids, hold blood pressure medications  All the  records are reviewed and case discussed with ED provider. Management plans discussed with the patient, family and they are in agreement.  CODE STATUS: Code Status History    Date Active Date Inactive Code Status Order ID Comments User Context   09/27/2016 11:28 AM 09/29/2016  1:30 PM Full Code [id number]  Saundra Shelling, MD ED   08/31/2015  5:48 PM 09/02/2015  7:52 PM Full Code [id number]  Nicholes Mango, MD ED    Advance Directive Documentation   Flowsheet Row Most Recent Value  Type of Advance Directive  Healthcare Power of Attorney  Pre-existing out of facility DNR order (yellow form or pink MOST form)  No data  "MOST" Form in Place?  No data       TOTAL TIME TAKING CARE OF THIS PATIENT: 60 minutes.    Theodoro Grist M.D on 12/23/2016 at 4:30 PM  Between 7am to 6pm - Pager - [phone] After 6pm go to [email or web address] - password [email or web address]  Office  [phone]  CC: Primary care physician; Volanda Napoleon, MD

## 2016-12-23 NOTE — Progress Notes (Signed)
Patient admitted to unit. Oriented to room, call bell, and staff. Bed in lowest position. Fall safety plan reviewed. Full assessment to Epic. Skin assessment verified with Charlean Sanfilippo. Telemetry box verification with tele clerk and Jamie NT- Box#: [id number]. Will continue to monitor.  Spoke to [employer]. They have to order meds for VQ scan and it will take about 3 hours; we should wait to give one time dilaudid order until right before she goes down for test. Patient updated and agreeable.

## 2016-12-23 NOTE — ED Notes (Signed)
Pt to ed with c/o weakness.   States she has had episode of fall about 1 month ago.  Pt skin warm and dry, pt alert and oriented.  Pt states her heart rate went up at home, pt hr now wnl.  VSS.  Pt on cardiac monitor, IV started and labs sent, pt tolerated well.

## 2016-12-23 NOTE — ED Notes (Signed)
Pt HR 150 on monitor, patient calling out states not feeling well.  MD made aware.  Medication ordered.

## 2016-12-23 NOTE — ED Notes (Signed)
This RN to bedside after patient repeatedly hitting call bell. Pt's HR noted to be 151 on the monitor, Annie Main, RN notified MD. Dr. Quentin Cornwall to bedside at this time. 2nd EKG obtained. VORB for 0.5 mg of Ativan obtained and medication given per MD order. Pt's HR noted to be decreasing at this time. Will continue to monitor for further patient needs.

## 2016-12-24 DIAGNOSIS — R002 Palpitations: Secondary | ICD-10-CM | POA: Diagnosis not present

## 2016-12-24 DIAGNOSIS — G4730 Sleep apnea, unspecified: Secondary | ICD-10-CM | POA: Diagnosis not present

## 2016-12-24 DIAGNOSIS — E785 Hyperlipidemia, unspecified: Secondary | ICD-10-CM | POA: Diagnosis not present

## 2016-12-24 DIAGNOSIS — Z6841 Body Mass Index (BMI) 40.0 and over, adult: Secondary | ICD-10-CM | POA: Diagnosis not present

## 2016-12-24 DIAGNOSIS — R0602 Shortness of breath: Secondary | ICD-10-CM | POA: Diagnosis present

## 2016-12-24 DIAGNOSIS — K648 Other hemorrhoids: Secondary | ICD-10-CM | POA: Diagnosis not present

## 2016-12-24 DIAGNOSIS — K921 Melena: Secondary | ICD-10-CM | POA: Diagnosis not present

## 2016-12-24 DIAGNOSIS — R000 Tachycardia, unspecified: Secondary | ICD-10-CM | POA: Diagnosis not present

## 2016-12-24 DIAGNOSIS — K219 Gastro-esophageal reflux disease without esophagitis: Secondary | ICD-10-CM | POA: Diagnosis not present

## 2016-12-24 DIAGNOSIS — Z7902 Long term (current) use of antithrombotics/antiplatelets: Secondary | ICD-10-CM | POA: Diagnosis not present

## 2016-12-24 DIAGNOSIS — I4581 Long QT syndrome: Secondary | ICD-10-CM | POA: Diagnosis not present

## 2016-12-24 DIAGNOSIS — J449 Chronic obstructive pulmonary disease, unspecified: Secondary | ICD-10-CM | POA: Diagnosis not present

## 2016-12-24 DIAGNOSIS — E860 Dehydration: Secondary | ICD-10-CM | POA: Diagnosis not present

## 2016-12-24 DIAGNOSIS — E119 Type 2 diabetes mellitus without complications: Secondary | ICD-10-CM | POA: Diagnosis not present

## 2016-12-24 DIAGNOSIS — D649 Anemia, unspecified: Secondary | ICD-10-CM | POA: Diagnosis present

## 2016-12-24 DIAGNOSIS — I959 Hypotension, unspecified: Secondary | ICD-10-CM | POA: Diagnosis not present

## 2016-12-24 DIAGNOSIS — I252 Old myocardial infarction: Secondary | ICD-10-CM | POA: Diagnosis not present

## 2016-12-24 DIAGNOSIS — I10 Essential (primary) hypertension: Secondary | ICD-10-CM | POA: Diagnosis not present

## 2016-12-24 DIAGNOSIS — Z8673 Personal history of transient ischemic attack (TIA), and cerebral infarction without residual deficits: Secondary | ICD-10-CM | POA: Diagnosis not present

## 2016-12-24 DIAGNOSIS — D62 Acute posthemorrhagic anemia: Secondary | ICD-10-CM | POA: Diagnosis not present

## 2016-12-24 LAB — BASIC METABOLIC PANEL
ANION GAP: 4 — AB (ref 5–15)
BUN: 25 mg/dL — ABNORMAL HIGH (ref 6–20)
CHLORIDE: 108 mmol/L (ref 101–111)
CO2: 25 mmol/L (ref 22–32)
Calcium: 8.1 mg/dL — ABNORMAL LOW (ref 8.9–10.3)
Creatinine, Ser: 1.11 mg/dL — ABNORMAL HIGH (ref 0.44–1.00)
GFR calc non Af Amer: 54 mL/min — ABNORMAL LOW (ref >60)
Glucose, Bld: 112 mg/dL — ABNORMAL HIGH (ref 65–99)
POTASSIUM: 3.5 mmol/L (ref 3.5–5.1)
Sodium: 137 mmol/L (ref 135–145)

## 2016-12-24 LAB — IRON AND TIBC
Iron: 26 ug/dL — ABNORMAL LOW (ref 28–170)
SATURATION RATIOS: 6 % — AB (ref 10.4–31.8)
TIBC: 444 ug/dL (ref 250–450)
UIBC: 418 ug/dL

## 2016-12-24 LAB — CBC
HCT: 25 % — ABNORMAL LOW (ref 35.0–47.0)
Hemoglobin: 8 g/dL — ABNORMAL LOW (ref 12.0–16.0)
MCH: 22.3 pg — ABNORMAL LOW (ref 26.0–34.0)
MCHC: 32 g/dL (ref 32.0–36.0)
MCV: 69.8 fL — ABNORMAL LOW (ref 80.0–100.0)
Platelets: 382 10*3/uL (ref 150–440)
RBC: 3.58 MIL/uL — ABNORMAL LOW (ref 3.80–5.20)
RDW: 20 % — ABNORMAL HIGH (ref 11.5–14.5)
WBC: 7 10*3/uL (ref 3.6–11.0)

## 2016-12-24 LAB — BLOOD GAS, VENOUS
Acid-Base Excess: 1 mmol/L (ref 0.0–2.0)
BICARBONATE: 26 mmol/L (ref 20.0–28.0)
O2 Saturation: 86.4 %
Patient temperature: 37
pCO2, Ven: 42 mmHg — ABNORMAL LOW (ref 44.0–60.0)
pH, Ven: 7.4 (ref 7.250–7.430)
pO2, Ven: 52 mmHg — ABNORMAL HIGH (ref 32.0–45.0)

## 2016-12-24 LAB — RETICULOCYTES
RBC.: 3.57 MIL/uL — ABNORMAL LOW (ref 3.80–5.20)
Retic Count, Absolute: 42.8 10*3/uL (ref 19.0–183.0)
Retic Ct Pct: 1.2 % (ref 0.4–3.1)

## 2016-12-24 LAB — FERRITIN: FERRITIN: 4 ng/mL — AB (ref 11–307)

## 2016-12-24 LAB — GLUCOSE, CAPILLARY: Glucose-Capillary: 115 mg/dL — ABNORMAL HIGH (ref 65–99)

## 2016-12-24 LAB — PREPARE RBC (CROSSMATCH)

## 2016-12-24 LAB — FOLATE: Folate: 36 ng/mL (ref >5.9)

## 2016-12-24 MED ORDER — SODIUM CHLORIDE 0.9 % IV SOLN: Freq: Once | INTRAVENOUS | Status: DC

## 2016-12-24 MED ORDER — FERROUS SULFATE 325 (65 FE) MG PO TABS
325.0000 mg | ORAL_TABLET | Freq: Two times a day (BID) | ORAL | Status: DC
  Administered 2016-12-24 – 2016-12-26 (×4): 325 mg via ORAL
  Filled 2016-12-24 (×4): qty 1

## 2016-12-24 MED ORDER — VENLAFAXINE HCL ER 75 MG PO CP24: 75.0000 mg | ORAL_CAPSULE | Freq: Every day | ORAL | Status: DC

## 2016-12-24 MED ORDER — METOPROLOL TARTRATE 25 MG PO TABS
25.0000 mg | ORAL_TABLET | Freq: Two times a day (BID) | ORAL | Status: DC
  Administered 2016-12-24: 25 mg via ORAL
  Filled 2016-12-24: qty 1

## 2016-12-24 MED ORDER — METOPROLOL TARTRATE 25 MG PO TABS
12.5000 mg | ORAL_TABLET | Freq: Two times a day (BID) | ORAL | Status: DC
  Administered 2016-12-24 – 2016-12-25 (×3): 12.5 mg via ORAL
  Administered 2016-12-26: 25 mg via ORAL
  Filled 2016-12-24 (×4): qty 1

## 2016-12-24 MED ORDER — PANTOPRAZOLE SODIUM 40 MG IV SOLR
40.0000 mg | Freq: Two times a day (BID) | INTRAVENOUS | Status: DC
  Administered 2016-12-24 – 2016-12-26 (×4): 40 mg via INTRAVENOUS
  Filled 2016-12-24 (×4): qty 40

## 2016-12-24 MED ORDER — SIMVASTATIN 40 MG PO TABS
40.0000 mg | ORAL_TABLET | Freq: Every day | ORAL | Status: DC
  Administered 2016-12-24 – 2016-12-25 (×2): 40 mg via ORAL
  Filled 2016-12-24 (×2): qty 1

## 2016-12-24 NOTE — Progress Notes (Signed)
Lindsey [name] is a 56 y.o. female  [id number]  Primary Cardiologist: Neoma Laming Reason for Consultation: Chest pain  HPI: This is a 56 year old African-American female with a past medical history of COPD hypertension hyperlipidemia resented to the hospital with chest pain and palpitation and was found to have sinus tachycardia. She denies any chest pain right now and MI has been ruled out.   Review of Systems: No orthopnea PND or leg swelling   Past Medical History:  Diagnosis Date  . Allergy   . COPD (chronic obstructive pulmonary disease) ([place])   . Diabetes mellitus without complication ([place])   . GERD (gastroesophageal reflux disease)   . Heart attack   . Hyperlipidemia   . Hypertension   . PUD (peptic ulcer disease)   . Sleep apnea   . Stroke [facility])     Medications Prior to Admission  Medication Sig Dispense Refill  . albuterol (PROVENTIL HFA;VENTOLIN HFA) 108 (90 BASE) MCG/ACT inhaler Inhale 2 puffs into the lungs every 4 (four) hours as needed for wheezing or shortness of breath.    Marland Kitchen albuterol (PROVENTIL) (2.5 MG/3ML) 0.083% nebulizer solution Take 2.5 mg by nebulization every 6 (six) hours as needed for wheezing or shortness of breath.    . ALPRAZolam (XANAX) 1 MG tablet Take 1-1.5 mg by mouth 2 (two) times daily. Pt takes one tablet in the morning and one and one-half tablet at night.    . baclofen (LIORESAL) 10 MG tablet Take 10 mg by mouth 3 (three) times daily.    . Calcium Lactate 648 MG TABS Take 648 mg by mouth 2 (two) times daily.    . cloNIDine (CATAPRES) 0.1 MG tablet Take 0.1 mg by mouth at bedtime.     . clopidogrel (PLAVIX) 75 MG tablet Take 1 tablet (75 mg total) by mouth daily. 30 tablet 0  . desvenlafaxine (PRISTIQ) 100 MG 24 hr tablet Take 100 mg by mouth daily. Pt takes 100 mg tab and 50 mg tab for total of 150 mg    . desvenlafaxine (PRISTIQ) 50 MG 24 hr tablet Take 50 mg by mouth daily. Pt takes 100 mg tab and 50 mg tab for total of 150 mg    .  esomeprazole (NEXIUM) 40 MG capsule Take 40 mg by mouth 2 (two) times daily.    . fentaNYL (DURAGESIC - DOSED MCG/HR) 50 MCG/HR Place 50 mcg onto the skin every 3 (three) days.    . fexofenadine (ALLEGRA) 60 MG tablet Take 60 mg by mouth 2 (two) times daily.    . fluticasone (FLONASE) 50 MCG/ACT nasal spray Place 1 spray into both nostrils 2 (two) times daily.    . folic acid (FOLVITE) 1 MG tablet Take 1 mg by mouth daily.    . hydrOXYzine (ATARAX/VISTARIL) 25 MG tablet Take 25 mg by mouth 3 (three) times daily.    Marland Kitchen l-methylfolate-B6-B12 (METANX) 3-35-2 MG TABS tablet Take 1 tablet by mouth 2 (two) times daily.    . metoprolol succinate (TOPROL-XL) 100 MG 24 hr tablet Take 100 mg by mouth daily. Take with or immediately following a meal.    . mirtazapine (REMERON) 45 MG tablet Take 45 mg by mouth at bedtime.    Marland Kitchen olmesartan-hydrochlorothiazide (BENICAR HCT) 40-12.5 MG tablet Take 1 tablet by mouth daily.    Marland Kitchen oxybutynin (DITROPAN) 5 MG tablet Take 5 mg by mouth 2 (two) times daily.    Marland Kitchen oxyCODONE (OXY IR/ROXICODONE) 5 MG immediate release tablet Take 5 mg  by mouth every 6 (six) hours as needed for severe pain.    . potassium chloride (K-DUR) 10 MEQ tablet Take 10 mEq by mouth daily.    . simvastatin (ZOCOR) 40 MG tablet Take 1 tablet by mouth daily.    . sucralfate (CARAFATE) 1 g tablet Take 1 tablet (1 g total) by mouth 4 (four) times daily -  with meals and at bedtime. 120 tablet 5  . zolpidem (AMBIEN) 10 MG tablet Take 10 mg by mouth at bedtime as needed for sleep.    . ONGLYZA 5 MG TABS tablet Take 1 tablet by mouth daily.    . Vitamin D, Ergocalciferol, (DRISDOL) 50000 units CAPS capsule Take 1 capsule (50,000 Units total) by mouth every 7 (seven) days. (Patient not taking: Reported on 12/23/2016) 30 capsule 5     . ALPRAZolam  1 mg Oral q morning - 10a   And  . ALPRAZolam  1.5 mg Oral QHS  . baclofen  10 mg Oral TID  . clopidogrel  75 mg Oral Daily  . enoxaparin (LOVENOX) injection   40 mg Subcutaneous Q12H  . fentaNYL  50 mcg Transdermal Q72H  . fluticasone  1 spray Each Nare BID  . folic acid  1 mg Oral Daily  . hydrOXYzine  25 mg Oral TID  . loratadine  10 mg Oral Daily  . metoprolol tartrate  25 mg Oral BID  . mirtazapine  45 mg Oral QHS  . oxybutynin  5 mg Oral BID  . pantoprazole  40 mg Oral Daily  . potassium chloride  10 mEq Oral Daily  . sodium chloride flush  3 mL Intravenous Q12H  . sucralfate  1 g Oral TID WC & HS  . venlafaxine XR  150 mg Oral Q breakfast    Infusions: . sodium chloride 100 mL/hr at 12/24/16 0030    Allergies  Allergen Reactions  . Morphine Hives  . Iodinated Diagnostic Agents Hives  . Aspirin Hives    Noted on MD progress notes and discussed with MD 09/02/15  . Etodolac Hives  . Ibuprofen Hives  . Shellfish Allergy Hives  . Tylenol [Acetaminophen] Hives    Upset stomach     Social History   Social History  . Marital status: Widowed    Spouse name: N/A  . Number of children: N/A  . Years of education: N/A   Occupational History  . disabled    Social History Main Topics  . Smoking status: Never Smoker  . Smokeless tobacco: Never Used  . Alcohol use No  . Drug use: No  . Sexual activity: Yes   Other Topics Concern  . Not on file   Social History Narrative  . No narrative on file    Family History  Problem Relation Age of Onset  . Hypertension Father   . COPD Father   . Heart disease Father   . Breast cancer Neg Hx     PHYSICAL EXAM: Vitals:   12/24/16 0337 12/24/16 0755  BP: (!) 97/51 (!) 98/51  Pulse: 85 96  Resp: 18 18  Temp: 98.3 F (36.8 C)      Intake/Output Summary (Last 24 hours) at 12/24/16 1153 Last data filed at 12/24/16 0900  Gross per 24 hour  Intake             3889 ml  Output             1000 ml  Net  2889 ml    General:  Well appearing. No respiratory difficulty HEENT: normal Neck: supple. no JVD. Carotids 2+ bilat; no bruits. No lymphadenopathy or  thryomegaly appreciated. Cor: PMI nondisplaced. Regular rate & rhythm. No rubs, gallops or murmurs. Lungs: clear Abdomen: soft, nontender, nondistended. No hepatosplenomegaly. No bruits or masses. Good bowel sounds. Extremities: no cyanosis, clubbing, rash, edema Neuro: alert & oriented x 3, cranial nerves grossly intact. moves all 4 extremities w/o difficulty. Affect pleasant.  ECG: Sinus tachycardia with nonspecific ST-T changes  Results for orders placed or performed during the hospital encounter of 12/23/16 (from the past 24 hour(s))  Troponin I     Status: None   Collection Time: 12/23/16 12:20 PM  Result Value Ref Range   Troponin I <0.03 <0.03 ng/mL  TSH     Status: None   Collection Time: 12/23/16 12:55 PM  Result Value Ref Range   TSH 1.133 0.350 - 4.500 uIU/mL  Basic metabolic panel     Status: Abnormal   Collection Time: 12/24/16  5:46 AM  Result Value Ref Range   Sodium 137 135 - 145 mmol/L   Potassium 3.5 3.5 - 5.1 mmol/L   Chloride 108 101 - 111 mmol/L   CO2 25 22 - 32 mmol/L   Glucose, Bld 112 (H) 65 - 99 mg/dL   BUN 25 (H) 6 - 20 mg/dL   Creatinine, Ser 1.11 (H) 0.44 - 1.00 mg/dL   Calcium 8.1 (L) 8.9 - 10.3 mg/dL   GFR calc non Af Amer 54 (L) >60 mL/min   GFR calc Af Amer >60 >60 mL/min   Anion gap 4 (L) 5 - 15  CBC     Status: Abnormal   Collection Time: 12/24/16  5:46 AM  Result Value Ref Range   WBC 7.0 3.6 - 11.0 K/uL   RBC 3.58 (L) 3.80 - 5.20 MIL/uL   Hemoglobin 8.0 (L) 12.0 - 16.0 g/dL   HCT 25.0 (L) 35.0 - 47.0 %   MCV 69.8 (L) 80.0 - 100.0 fL   MCH 22.3 (L) 26.0 - 34.0 pg   MCHC 32.0 32.0 - 36.0 g/dL   RDW 20.0 (H) 11.5 - 14.5 %   Platelets 382 150 - 440 K/uL  Folate     Status: None   Collection Time: 12/24/16  5:46 AM  Result Value Ref Range   Folate 36.0 >5.9 ng/mL  Iron and TIBC     Status: Abnormal   Collection Time: 12/24/16  5:46 AM  Result Value Ref Range   Iron 26 (L) 28 - 170 ug/dL   TIBC 444 250 - 450 ug/dL   Saturation Ratios  6 (L) 10.4 - 31.8 %   UIBC 418 ug/dL  Ferritin     Status: Abnormal   Collection Time: 12/24/16  5:46 AM  Result Value Ref Range   Ferritin 4 (L) 11 - 307 ng/mL  Reticulocytes     Status: Abnormal   Collection Time: 12/24/16  5:46 AM  Result Value Ref Range   Retic Ct Pct 1.2 0.4 - 3.1 %   RBC. 3.57 (L) 3.80 - 5.20 MIL/uL   Retic Count, Manual 42.8 19.0 - 183.0 K/uL  Glucose, capillary     Status: Abnormal   Collection Time: 12/24/16  7:59 AM  Result Value Ref Range   Glucose-Capillary 115 (H) 65 - 99 mg/dL   Dg Chest 2 View  Result Date: 12/23/2016 CLINICAL DATA:  Generalized body aches and pain. Tachycardia and weakness. EXAM: CHEST  2 VIEW COMPARISON:  09/27/2016 and prior exams FINDINGS: Upper limits normal heart size and mild peribronchial thickening again noted. There is no evidence of focal airspace disease, pulmonary edema, suspicious pulmonary nodule/mass, pleural effusion, or pneumothorax. No acute bony abnormalities are identified. A thoracic stimulator is again noted. IMPRESSION: No active cardiopulmonary disease. Electronically Signed   By: Margarette Canada M.D.   On: 12/23/2016 11:09   Nm Pulmonary Perf And Vent  Result Date: 12/23/2016 CLINICAL DATA:  56 year old female with dyspnea. EXAM: NUCLEAR MEDICINE VENTILATION - PERFUSION LUNG SCAN TECHNIQUE: Ventilation images were obtained in multiple projections using inhaled aerosol Tc-12m DTPA. Perfusion images were obtained in multiple projections after intravenous injection of Tc-17m MAA. RADIOPHARMACEUTICALS:  32 mCi Technetium-30m DTPA aerosol inhalation and 4 mCi Technetium-9m MAA IV COMPARISON:  Chest radiograph dated 12/23/2016 FINDINGS: Ventilation: No large segmental ventilation defect. Area of decreased uptake noted in the left lower lobe which may be related to cardiac attenuation. Perfusion: No wedge shaped mismatch peripheral perfusion defects to suggest acute pulmonary embolism. There is overall better perfusion than  ventilation. An area of decreased perfusion noted in the left lower lobe which is matched or smaller than corresponding ventilation defect and likely related to cardiac attenuation. IMPRESSION: Low probability for pulmonary embolism. Electronically Signed   By: Anner Crete M.D.   On: 12/23/2016 23:40     ASSESSMENT AND PLAN: Atypical chest pain with sinus tachycardia and COPD with dehydration. Added metoprolol 25 mg twice a day even though patient has COPD a small dose of metoprolol should be okay. She is chest pain-free and can be discharged with follow-up Tuesday at 9 AM and will do outpatient stress tests and further workup.  Lindsey [name] A

## 2016-12-24 NOTE — Consult Note (Signed)
Consultation  Referring Provider:     No ref. provider found Primary Care Physician:  Volanda Napoleon, MD Primary Gastroenterologist:  Dr. Tiffany Kocher         Reason for Consultation: Anemia with BRBPR  Date of Admission:  12/23/2016 Date of Consultation:  12/24/2016         HPI:   Deneisha Dade is a 56 y.o. female with a history notable for DM, CAD, COPD, GERD, olmesartan induced enteritis and gastric bypass who presents for evaluation of tachycardia. Upon presentation, MI was ruled out. The patient was admitted for monitoring of hypotension.   The patient has a long standing history of anemia that has not been well elucidated. She had an EGD in  September 2013 which revealed superficial ulcers, a normal EGD in February 2014, an EGD in July 2015 which was notable for gastric erosions and pathology being negative for H.pylori and most recently an EGD in November 2017, when she was hospitalized for syncope and epigastric pain, which was grossly normal. She had a colonoscopy in 2012 which was normal with a fair prep.   On further questioning, she reported noting red blood in the toilet bowl for the past few days, which is a new symptom for her. She has also noted blood on the toilet paper after wiping in the past, but not recently. She generally has 2 BMs daily, which is what she's been having lately. There is also no change in the caliber, consistency or color of her stool.  She denies nausea, emesis, abdominal pain, diarrhea, constipation, coffee ground emesis, hematemesis or melena. She reports not taking clopidogrel for many weeks now for unclear reasons, she thinks her doctors discontinued this medication. She also reports not using aspirin, NSAIDs or any other blood thinning medication.  The patient denies a family history of anemia, but reports a family history of CKD, including her mother and one brother.    Past Medical History:  Diagnosis Date  . Allergy   . COPD (chronic  obstructive pulmonary disease) ([place])   . Diabetes mellitus without complication ([place])   . GERD (gastroesophageal reflux disease)   . Heart attack   . Hyperlipidemia   . Hypertension   . PUD (peptic ulcer disease)   . Sleep apnea   . Stroke [facility])     Past Surgical History:  Procedure Laterality Date  . ABDOMINAL HYSTERECTOMY    . back surgery    . broken wrist    . ESOPHAGOGASTRODUODENOSCOPY (EGD) WITH PROPOFOL N/A 09/28/2016   Procedure: ESOPHAGOGASTRODUODENOSCOPY (EGD) WITH PROPOFOL;  Surgeon: Manya Silvas, MD;  Location: [facility] ENDOSCOPY;  Service: Endoscopy;  Laterality: N/A;  . GALLBLADDER SURGERY    . GASTRIC BYPASS    . HERNIA REPAIR    . REPLACEMENT TOTAL KNEE    . ROTATOR CUFF REPAIR      Prior to Admission medications   Medication Sig Start Date End Date Taking? Authorizing Provider  albuterol (PROVENTIL HFA;VENTOLIN HFA) 108 (90 BASE) MCG/ACT inhaler Inhale 2 puffs into the lungs every 4 (four) hours as needed for wheezing or shortness of breath.   Yes Historical Provider, MD  albuterol (PROVENTIL) (2.5 MG/3ML) 0.083% nebulizer solution Take 2.5 mg by nebulization every 6 (six) hours as needed for wheezing or shortness of breath.   Yes Historical Provider, MD  ALPRAZolam Duanne Moron) 1 MG tablet Take 1-1.5 mg by mouth 2 (two) times daily. Pt takes one tablet in the morning and one and one-half tablet at night.  Yes Historical Provider, MD  baclofen (LIORESAL) 10 MG tablet Take 10 mg by mouth 3 (three) times daily.   Yes Historical Provider, MD  Calcium Lactate 648 MG TABS Take 648 mg by mouth 2 (two) times daily.   Yes Historical Provider, MD  cloNIDine (CATAPRES) 0.1 MG tablet Take 0.1 mg by mouth at bedtime.    Yes Historical Provider, MD  clopidogrel (PLAVIX) 75 MG tablet Take 1 tablet (75 mg total) by mouth daily. 09/01/15  Yes Sital Mody, MD  desvenlafaxine (PRISTIQ) 100 MG 24 hr tablet Take 100 mg by mouth daily. Pt takes 100 mg tab and 50 mg tab for total of 150 mg    Yes Historical Provider, MD  desvenlafaxine (PRISTIQ) 50 MG 24 hr tablet Take 50 mg by mouth daily. Pt takes 100 mg tab and 50 mg tab for total of 150 mg   Yes Historical Provider, MD  esomeprazole (NEXIUM) 40 MG capsule Take 40 mg by mouth 2 (two) times daily.   Yes Historical Provider, MD  fentaNYL (DURAGESIC - DOSED MCG/HR) 50 MCG/HR Place 50 mcg onto the skin every 3 (three) days.   Yes Historical Provider, MD  fexofenadine (ALLEGRA) 60 MG tablet Take 60 mg by mouth 2 (two) times daily.   Yes Historical Provider, MD  fluticasone (FLONASE) 50 MCG/ACT nasal spray Place 1 spray into both nostrils 2 (two) times daily.   Yes Historical Provider, MD  folic acid (FOLVITE) 1 MG tablet Take 1 mg by mouth daily.   Yes Historical Provider, MD  hydrOXYzine (ATARAX/VISTARIL) 25 MG tablet Take 25 mg by mouth 3 (three) times daily.   Yes Historical Provider, MD  l-methylfolate-B6-B12 (METANX) 3-35-2 MG TABS tablet Take 1 tablet by mouth 2 (two) times daily.   Yes Historical Provider, MD  metoprolol succinate (TOPROL-XL) 100 MG 24 hr tablet Take 100 mg by mouth daily. Take with or immediately following a meal.   Yes Historical Provider, MD  mirtazapine (REMERON) 45 MG tablet Take 45 mg by mouth at bedtime.   Yes Historical Provider, MD  olmesartan-hydrochlorothiazide (BENICAR HCT) 40-12.5 MG tablet Take 1 tablet by mouth daily.   Yes Historical Provider, MD  oxybutynin (DITROPAN) 5 MG tablet Take 5 mg by mouth 2 (two) times daily.   Yes Historical Provider, MD  oxyCODONE (OXY IR/ROXICODONE) 5 MG immediate release tablet Take 5 mg by mouth every 6 (six) hours as needed for severe pain.   Yes Historical Provider, MD  potassium chloride (K-DUR) 10 MEQ tablet Take 10 mEq by mouth daily.   Yes Historical Provider, MD  simvastatin (ZOCOR) 40 MG tablet Take 1 tablet by mouth daily. 09/24/16  Yes Historical Provider, MD  sucralfate (CARAFATE) 1 g tablet Take 1 tablet (1 g total) by mouth 4 (four) times daily -  with  meals and at bedtime. 09/29/16  Yes Theodoro Grist, MD  zolpidem (AMBIEN) 10 MG tablet Take 10 mg by mouth at bedtime as needed for sleep.   Yes Historical Provider, MD  ONGLYZA 5 MG TABS tablet Take 1 tablet by mouth daily. 08/28/16   Historical Provider, MD  Vitamin D, Ergocalciferol, (DRISDOL) 50000 units CAPS capsule Take 1 capsule (50,000 Units total) by mouth every 7 (seven) days. Patient not taking: Reported on 12/23/2016 10/05/16   Theodoro Grist, MD    Family History  Problem Relation Age of Onset  . Hypertension Father   . COPD Father   . Heart disease Father   . Breast cancer Neg Hx  Social History  Substance Use Topics  . Smoking status: Never Smoker  . Smokeless tobacco: Never Used  . Alcohol use No    Allergies as of 12/23/2016 - Review Complete 12/23/2016  Allergen Reaction Noted  . Morphine Hives 04/12/2015  . Iodinated diagnostic agents Hives 04/12/2015  . Aspirin Hives 09/02/2015  . Eggs or egg-derived products Itching 12/23/2016  . Etodolac Hives 04/12/2015  . Ibuprofen Hives 04/12/2015  . Milk-related compounds Itching 12/23/2016  . Shellfish allergy Hives 04/12/2015  . Tylenol [acetaminophen] Hives 09/27/2016    Review of Systems:    All systems reviewed and negative except where noted in HPI.   Physical Exam:  Vital signs in last 24 hours: Temp:  [98 F (36.7 C)-99.4 F (37.4 C)] 98 F (36.7 C) (01/28 1218) Pulse Rate:  [85-109] 98 (01/28 1218) Resp:  [12-22] 12 (01/28 1218) BP: (97-136)/(50-73) 118/70 (01/28 1218) SpO2:  [94 %-100 %] 95 % (01/28 1218) Weight:  [143.2 kg (315 lb 9.6 oz)] 143.2 kg (315 lb 9.6 oz) (01/27 1836) Last BM Date: 12/16/16   General: Obese black female resting in bed in NAD watching TV Head:  Normocephalic Eyes:   No icterus. Ears:  Normal auditory acuity. Neck:  Supple; no masses or thyroidomegaly Lungs: Respirations even and unlabored. Lungs clear to auscultation bilaterally.   No wheezes, crackles, or rhonchi.    Heart: Regular rate and rhythm, normal S1, S2, no murmurs Abdomen: +BS, obese, soft, nondistended, nontender Rectal: normal perianal exam, although that is limited by the patient's body habitus, good rectal tone, no blood on glove on stool in vault Msk:  Symmetrical without gross deformities. Extremities:  Without edema, cyanosis or clubbing. Neurologic:  Grossly normal Skin:  Intact without significant lesions or rashes. Psych:  Alert, answering questions appropriately  LAB RESULTS:  Recent Labs  12/23/16 1043 12/24/16 0546  WBC 6.4 7.0  HGB 9.0* 8.0*  HCT 29.3* 25.0*  PLT 415 382   BMET  Recent Labs  12/23/16 1043 12/24/16 0546  NA 138 137  K 4.4 3.5  CL 107 108  CO2 25 25  GLUCOSE 145* 112*  BUN 35* 25*  CREATININE 1.37* 1.11*  CALCIUM 8.5* 8.1*   LFT  Recent Labs  12/23/16 1043  PROT 6.9  ALBUMIN 3.2*  AST 30  ALT 16  ALKPHOS 98  BILITOT 0.6   PT/INR No results for input(s): LABPROT, INR in the last 72 hours.  STUDIES: Dg Chest 2 View  Result Date: 12/23/2016 CLINICAL DATA:  Generalized body aches and pain. Tachycardia and weakness. EXAM: CHEST  2 VIEW COMPARISON:  09/27/2016 and prior exams FINDINGS: Upper limits normal heart size and mild peribronchial thickening again noted. There is no evidence of focal airspace disease, pulmonary edema, suspicious pulmonary nodule/mass, pleural effusion, or pneumothorax. No acute bony abnormalities are identified. A thoracic stimulator is again noted. IMPRESSION: No active cardiopulmonary disease. Electronically Signed   By: Margarette Canada M.D.   On: 12/23/2016 11:09   Nm Pulmonary Perf And Vent  Result Date: 12/23/2016 CLINICAL DATA:  56 year old female with dyspnea. EXAM: NUCLEAR MEDICINE VENTILATION - PERFUSION LUNG SCAN TECHNIQUE: Ventilation images were obtained in multiple projections using inhaled aerosol Tc-20m DTPA. Perfusion images were obtained in multiple projections after intravenous injection of Tc-54m  MAA. RADIOPHARMACEUTICALS:  32 mCi Technetium-85m DTPA aerosol inhalation and 4 mCi Technetium-49m MAA IV COMPARISON:  Chest radiograph dated 12/23/2016 FINDINGS: Ventilation: No large segmental ventilation defect. Area of decreased uptake noted in the left lower lobe  which may be related to cardiac attenuation. Perfusion: No wedge shaped mismatch peripheral perfusion defects to suggest acute pulmonary embolism. There is overall better perfusion than ventilation. An area of decreased perfusion noted in the left lower lobe which is matched or smaller than corresponding ventilation defect and likely related to cardiac attenuation. IMPRESSION: Low probability for pulmonary embolism. Electronically Signed   By: Anner Crete M.D.   On: 12/23/2016 23:40     Impression / Plan:   Tawney Vanorman is a 56 y.o. y/o female with a history notable for DM, CAD, COPD, GERD, olmesartan induced enteritis and gastric bypass who presents for evaluation of tachycardia. There is concern that she has GI losses of blood that are resulting in her symptoms. She is certainly anemic and iron deficient. It is notable that her reticulocyte count is so low, however, as one would expect that to be elevated in the setting of iron deficiency due to GI blood loss. It is also notable that the patient's hemoglobin is not even a gram lower on repeated checks than her baseline for the past 4 months, which would make it less likely that her tachycardia and hypotension were completely due to GI blood loss. Furthermore, blood is a strong cathartic and should she have significant bleeding, she would be having more bowel movements than her normal. I suspect her lower GI bleeding could be due to internal hemorrhoids. Given her recent normal upper endoscopy, I believe the yield of repeating that testing currently is low. I would recommend a thorough hematologic evaluation for iron deficiency anemia, including for partial thalassemias and chronic  kidney disease, both of which could be compounding her anemia. She should undergo an outpatient colonoscopy given concern for lower GI tract bleeding in the setting of iron deficiency anemia and a colonoscopy over 5 years ago with poor prep, at which time an EGD could be considered as well.  - ensure good IV access - monitor Hg qd - hematology consult for thorough serologic work up for iron deficiency anemia - iron infusion while hospitalized - outpatient colonoscopy +/- EGD - hold DVT ppx with enoxaparin while concern for GI bleeding - continue to avoid all NSAIDs - continue PPI bid  Thank you for involving me in the care of this patient.      LOS: 0 days   Lisbeth Renshaw, MD  12/24/2016, 2:38 PM

## 2016-12-24 NOTE — Progress Notes (Signed)
[facility] NAME: Lindsey [name]    MR#:  [id number]  DATE OF BIRTH:  [birth year]  SUBJECTIVE:   patient here with weakness dizziness, bloody stools this am and chest pain  REVIEW OF SYSTEMS:    Review of Systems  Constitutional: Positive for malaise/fatigue. Negative for chills and fever.  HENT: Negative.  Negative for ear discharge, ear pain, hearing loss, nosebleeds and sore throat.   Eyes: Negative.  Negative for blurred vision and pain.  Respiratory: Positive for shortness of breath. Negative for cough, hemoptysis and wheezing.   Cardiovascular: Negative.  Negative for chest pain, palpitations and leg swelling.  Gastrointestinal: Negative.  Negative for abdominal pain, blood in stool, diarrhea, nausea and vomiting.  Genitourinary: Negative.  Negative for dysuria.  Musculoskeletal: Negative.  Negative for back pain.  Skin: Negative.   Neurological: Positive for dizziness. Negative for tremors, speech change, focal weakness, seizures and headaches.  Endo/Heme/Allergies: Negative.  Does not bruise/bleed easily.  Psychiatric/Behavioral: Negative.  Negative for depression, hallucinations and suicidal ideas.    Tolerating Diet:yes      DRUG ALLERGIES:   Allergies  Allergen Reactions  . Morphine Hives  . Iodinated Diagnostic Agents Hives  . Aspirin Hives    Noted on MD progress notes and discussed with MD 09/02/15  . Etodolac Hives  . Ibuprofen Hives  . Shellfish Allergy Hives  . Tylenol [Acetaminophen] Hives    Upset stomach     VITALS:  Blood pressure 118/70, pulse 98, temperature 98 F (36.7 C), temperature source Oral, resp. rate 12, height 5\' 7"  (1.702 m), weight (!) 143.2 kg (315 lb 9.6 oz), SpO2 95 %.  PHYSICAL EXAMINATION:   Physical Exam    LABORATORY PANEL:   CBC  Recent Labs Lab 12/24/16 0546  WBC 7.0  HGB 8.0*  HCT 25.0*  PLT 382    ------------------------------------------------------------------------------------------------------------------  Chemistries   Recent Labs Lab 12/23/16 1043 12/24/16 0546  NA 138 137  K 4.4 3.5  CL 107 108  CO2 25 25  GLUCOSE 145* 112*  BUN 35* 25*  CREATININE 1.37* 1.11*  CALCIUM 8.5* 8.1*  AST 30  --   ALT 16  --   ALKPHOS 98  --   BILITOT 0.6  --    ------------------------------------------------------------------------------------------------------------------  Cardiac Enzymes  Recent Labs Lab 12/23/16 1043 12/23/16 1220  TROPONINI <0.03 <0.03   ------------------------------------------------------------------------------------------------------------------  RADIOLOGY:  Dg Chest 2 View  Result Date: 12/23/2016 CLINICAL DATA:  Generalized body aches and pain. Tachycardia and weakness. EXAM: CHEST  2 VIEW COMPARISON:  09/27/2016 and prior exams FINDINGS: Upper limits normal heart size and mild peribronchial thickening again noted. There is no evidence of focal airspace disease, pulmonary edema, suspicious pulmonary nodule/mass, pleural effusion, or pneumothorax. No acute bony abnormalities are identified. A thoracic stimulator is again noted. IMPRESSION: No active cardiopulmonary disease. Electronically Signed   By: Margarette Canada M.D.   On: 12/23/2016 11:09   Nm Pulmonary Perf And Vent  Result Date: 12/23/2016 CLINICAL DATA:  56 year old female with dyspnea. EXAM: NUCLEAR MEDICINE VENTILATION - PERFUSION LUNG SCAN TECHNIQUE: Ventilation images were obtained in multiple projections using inhaled aerosol Tc-43m DTPA. Perfusion images were obtained in multiple projections after intravenous injection of Tc-32m MAA. RADIOPHARMACEUTICALS:  32 mCi Technetium-56m DTPA aerosol inhalation and 4 mCi Technetium-29m MAA IV COMPARISON:  Chest radiograph dated 12/23/2016 FINDINGS: Ventilation: No large segmental ventilation defect. Area of decreased uptake noted in the left lower  lobe which may be  related to cardiac attenuation. Perfusion: No wedge shaped mismatch peripheral perfusion defects to suggest acute pulmonary embolism. There is overall better perfusion than ventilation. An area of decreased perfusion noted in the left lower lobe which is matched or smaller than corresponding ventilation defect and likely related to cardiac attenuation. IMPRESSION: Low probability for pulmonary embolism. Electronically Signed   By: Anner Crete M.D.   On: 12/23/2016 23:40     ASSESSMENT AND PLAN:    56 year old female with a history of diabetes and COPD status post gastric bypass several years ago and iron deficiency anemia who presents with chest pain, palpitations, dizziness and shortness of breath.  1. Hypotension most likely due to to weight loss and overmedication. I will discontinue hypertensive medications with exception of low-dose metoprolol Check cortisol level Low blood pressure may also be from anemia Patient has consented for blood transfusion  2. Acute blood loss anemia, significant iron deficiency, symptomatic: Patient will receive 1 unit PRBC Oncology evaluation for iron transfusion GI evaluation as patient is reporting bright red blood per rectum Last colonoscopy at age 1 which she reports was normal Start ferrous sulfate Hold Plavix  3. Chest pain I am suspecting this is due to anemia and not ACS. Troponins were negative. VQ scan was negative for pulmonary emboli.  4. COPD without exacerbation  5. Stress incontinence on discharge plan 6. Depression: Continue Effexor, Remeron,  7. History of PUD on Carafate and PPI  obtain GI consultation  Management plans discussed with the patient and she is in agreement.  CODE STATUS: full  TOTAL TIME TAKING CARE OF THIS PATIENT: 30 minutes.     POSSIBLE D/C 1-2 days2, DEPENDING ON CLINICAL CONDITION.   Natausha Jungwirth M.D on 12/24/2016 at 12:51 PM  Between 7am to 6pm - Pager - [phone] After 6pm  go to [email or web address] - password [email or web address] [facility]  Office  [phone]  CC: Primary care physician; Volanda Napoleon, MD  Note: This dictation was prepared with Dragon dictation along with smaller phrase technology. Any transcriptional errors that result from this process are unintentional.

## 2016-12-24 NOTE — Care Management Obs Status (Signed)
[facility] NOTIFICATION   Patient Details  Name: Lindsey [name] MRN: [id number] Date of Birth: 1961-08-18   Medicare Observation Status Notification Given:  Yes    Sequoyah Counterman A, RN 12/24/2016, 4:02 PM

## 2016-12-25 DIAGNOSIS — I252 Old myocardial infarction: Secondary | ICD-10-CM

## 2016-12-25 DIAGNOSIS — K219 Gastro-esophageal reflux disease without esophagitis: Secondary | ICD-10-CM | POA: Diagnosis not present

## 2016-12-25 DIAGNOSIS — D509 Iron deficiency anemia, unspecified: Secondary | ICD-10-CM

## 2016-12-25 DIAGNOSIS — R42 Dizziness and giddiness: Secondary | ICD-10-CM

## 2016-12-25 DIAGNOSIS — K921 Melena: Secondary | ICD-10-CM | POA: Diagnosis not present

## 2016-12-25 DIAGNOSIS — Z87820 Personal history of traumatic brain injury: Secondary | ICD-10-CM

## 2016-12-25 DIAGNOSIS — K279 Peptic ulcer, site unspecified, unspecified as acute or chronic, without hemorrhage or perforation: Secondary | ICD-10-CM

## 2016-12-25 DIAGNOSIS — I10 Essential (primary) hypertension: Secondary | ICD-10-CM | POA: Diagnosis not present

## 2016-12-25 DIAGNOSIS — G4730 Sleep apnea, unspecified: Secondary | ICD-10-CM | POA: Diagnosis not present

## 2016-12-25 DIAGNOSIS — E119 Type 2 diabetes mellitus without complications: Secondary | ICD-10-CM | POA: Diagnosis not present

## 2016-12-25 DIAGNOSIS — E785 Hyperlipidemia, unspecified: Secondary | ICD-10-CM

## 2016-12-25 DIAGNOSIS — J449 Chronic obstructive pulmonary disease, unspecified: Secondary | ICD-10-CM | POA: Diagnosis not present

## 2016-12-25 DIAGNOSIS — Z9884 Bariatric surgery status: Secondary | ICD-10-CM | POA: Diagnosis not present

## 2016-12-25 DIAGNOSIS — R0602 Shortness of breath: Secondary | ICD-10-CM | POA: Diagnosis not present

## 2016-12-25 DIAGNOSIS — R079 Chest pain, unspecified: Secondary | ICD-10-CM | POA: Diagnosis not present

## 2016-12-25 DIAGNOSIS — D62 Acute posthemorrhagic anemia: Secondary | ICD-10-CM | POA: Diagnosis not present

## 2016-12-25 LAB — LACTATE DEHYDROGENASE: LDH: 136 U/L (ref 98–192)

## 2016-12-25 LAB — TYPE AND SCREEN
Blood Product Expiration Date: 201802192359
ISSUE DATE / TIME: 201801281751
Unit Type and Rh: 6200

## 2016-12-25 LAB — CORTISOL: CORTISOL PLASMA: 8.6 ug/dL

## 2016-12-25 LAB — HEMOGLOBIN: HEMOGLOBIN: 9.1 g/dL — AB (ref 12.0–16.0)

## 2016-12-25 LAB — VITAMIN B12
VITAMIN B 12: 483 pg/mL (ref 180–914)
VITAMIN B 12: 521 pg/mL (ref 180–914)

## 2016-12-25 LAB — FOLATE: Folate: 43 ng/mL

## 2016-12-25 LAB — GLUCOSE, CAPILLARY: Glucose-Capillary: 101 mg/dL — ABNORMAL HIGH (ref 65–99)

## 2016-12-25 LAB — DAT, POLYSPECIFIC AHG (ARMC ONLY): Polyspecific AHG test: NEGATIVE

## 2016-12-25 MED ORDER — SODIUM CHLORIDE 0.9 % IV SOLN
510.0000 mg | Freq: Once | INTRAVENOUS | Status: AC
  Administered 2016-12-25: 18:00:00 510 mg via INTRAVENOUS
  Filled 2016-12-25: qty 17

## 2016-12-25 NOTE — Progress Notes (Signed)
SUBJECTIVE: No further cp   Vitals:   12/25/16 0340 12/25/16 0849 12/25/16 0853 12/25/16 0855  BP: 107/61 102/61 118/68 139/79  Pulse: 83 80 68 93  Resp: 18 18 18 18   Temp: 97.8 F (36.6 C)     TempSrc: Oral     SpO2: 94% 98% 99% 96%  Weight:      Height:        Intake/Output Summary (Last 24 hours) at 12/25/16 1024 Last data filed at 12/24/16 2103  Gross per 24 hour  Intake              866 ml  Output                0 ml  Net              866 ml    LABS: Basic Metabolic Panel:  Recent Labs  12/23/16 1043 12/24/16 0546  NA 138 137  K 4.4 3.5  CL 107 108  CO2 25 25  GLUCOSE 145* 112*  BUN 35* 25*  CREATININE 1.37* 1.11*  CALCIUM 8.5* 8.1*   Liver Function Tests:  Recent Labs  12/23/16 1043  AST 30  ALT 16  ALKPHOS 98  BILITOT 0.6  PROT 6.9  ALBUMIN 3.2*   No results for input(s): LIPASE, AMYLASE in the last 72 hours. CBC:  Recent Labs  12/23/16 1043 12/24/16 0546 12/25/16 0827  WBC 6.4 7.0  --   NEUTROABS 2.8  --   --   HGB 9.0* 8.0* 9.1*  HCT 29.3* 25.0*  --   MCV 71.0* 69.8*  --   PLT 415 382  --    Cardiac Enzymes:  Recent Labs  12/23/16 1043 12/23/16 1220  TROPONINI <0.03 <0.03   BNP: Invalid input(s): POCBNP D-Dimer: No results for input(s): DDIMER in the last 72 hours. Hemoglobin A1C: No results for input(s): HGBA1C in the last 72 hours. Fasting Lipid Panel: No results for input(s): CHOL, HDL, LDLCALC, TRIG, CHOLHDL, LDLDIRECT in the last 72 hours. Thyroid Function Tests:  Recent Labs  12/23/16 1255  TSH 1.133   Anemia Panel:  Recent Labs  12/24/16 0546  VITAMINB12 483  FOLATE 36.0  FERRITIN 4*  TIBC 444  IRON 26*  RETICCTPCT 1.2     PHYSICAL EXAM General: Well developed, well nourished, in no acute distress HEENT:  Normocephalic and atramatic Neck:  No JVD.  Lungs: Clear bilaterally to auscultation and percussion. Heart: HRRR . Normal S1 and S2 without gallops or murmurs.  Abdomen: Bowel sounds are  positive, abdomen soft and non-tender  Msk:  Back normal, normal gait. Normal strength and tone for age. Extremities: No clubbing, cyanosis or edema.   Neuro: Alert and oriented X 3. Psych:  Good affect, responds appropriately  TELEMETRY: NSR  ASSESSMENT AND PLAN: Atypical chest pain with sinus tachycardia and COPD with dehydration. Added metoprolol 25 mg twice a day even though patient has COPD a small dose of metoprolol should be okay. She is chest pain-free and can be discharged with follow-up thursday at 12 M and will do outpatient stress tests and further workup.   Active Problems:   Chest pain   Dyspnea   Palpitations   Symptomatic anemia    KHAN,SHAUKAT A, MD, [facility] 12/25/2016 10:24 AM

## 2016-12-25 NOTE — Consult Note (Signed)
[place]  [phone] [phone]  ID: Lindsey [name] OB: 09-12-1961  MR#: [id number]  NTZ#:[id number]  Patient Care Team: Jodi Marble, MD as PCP - General (Internal Medicine)  CHIEF COMPLAINT: Iron deficiency anemia, hematochezia, weakness, chest pain, dizziness.  INTERVAL HISTORY: Patient is a 56 year old female who initially presented to the emergency room with weakness and dizziness and a heart rate greater than 150. She was found to have a significant iron deficiency anemia and has admitted to bloody stools over the past several days. She has a history of gastric bypass surgery and also reports she was told at one point she has "Crohn's disease". Patient received a blood transfusion yesterday and feels significantly improved. She has no neurologic complaints. She denies any further chest pain or shortness of breath. She denies any fevers or illnesses. She has no nausea, vomiting, constipation, or diarrhea. She denies melena, but has hematochezia. She has no urinary complaints. Patient otherwise feels well and offers no further specific complaints.  REVIEW OF SYSTEMS:   Review of Systems  Constitutional: Positive for malaise/fatigue. Negative for fever and weight loss.  Respiratory: Positive for shortness of breath. Negative for cough and hemoptysis.   Cardiovascular: Negative.  Negative for chest pain and leg swelling.  Gastrointestinal: Positive for blood in stool. Negative for abdominal pain and melena.  Genitourinary: Negative.   Musculoskeletal: Negative.   Neurological: Positive for weakness.  Psychiatric/Behavioral: Negative.  The patient is not nervous/anxious.     As per HPI. Otherwise, a complete review of systems is negative.  PAST MEDICAL HISTORY: Past Medical History:  Diagnosis Date  . Allergy   . COPD (chronic obstructive pulmonary disease) ([place])   . Diabetes mellitus without complication ([place])   . GERD  (gastroesophageal reflux disease)   . Heart attack   . Hyperlipidemia   . Hypertension   . PUD (peptic ulcer disease)   . Sleep apnea   . Stroke [facility])     PAST SURGICAL HISTORY: Past Surgical History:  Procedure Laterality Date  . ABDOMINAL HYSTERECTOMY    . back surgery    . broken wrist    . ESOPHAGOGASTRODUODENOSCOPY (EGD) WITH PROPOFOL N/A 09/28/2016   Procedure: ESOPHAGOGASTRODUODENOSCOPY (EGD) WITH PROPOFOL;  Surgeon: Manya Silvas, MD;  Location: [facility] ENDOSCOPY;  Service: Endoscopy;  Laterality: N/A;  . GALLBLADDER SURGERY    . GASTRIC BYPASS    . HERNIA REPAIR    . REPLACEMENT TOTAL KNEE    . ROTATOR CUFF REPAIR      FAMILY HISTORY: Family History  Problem Relation Age of Onset  . Hypertension Father   . COPD Father   . Heart disease Father   . Breast cancer Neg Hx     ADVANCED DIRECTIVES (Y/N):  @ADVDIR @  HEALTH MAINTENANCE: Social History  Substance Use Topics  . Smoking status: Never Smoker  . Smokeless tobacco: Never Used  . Alcohol use No     Colonoscopy:  PAP:  Bone density:  Lipid panel:  Allergies  Allergen Reactions  . Morphine Hives  . Iodinated Diagnostic Agents Hives  . Aspirin Hives    Noted on MD progress notes and discussed with MD 09/02/15  . Etodolac Hives  . Ibuprofen Hives  . Shellfish Allergy Hives  . Tylenol [Acetaminophen] Hives    Upset stomach     Current Facility-Administered Medications  Medication Dose Route Frequency Provider Last Rate Last Dose  . 0.9 %  sodium chloride infusion   Intravenous Once Bettey Costa, MD  Stopped at 12/25/16 0629  . acetaminophen (TYLENOL) tablet 650 mg  650 mg Oral Q6H PRN Theodoro Grist, MD       Or  . acetaminophen (TYLENOL) suppository 650 mg  650 mg Rectal Q6H PRN Theodoro Grist, MD      . albuterol (PROVENTIL) (2.5 MG/3ML) 0.083% nebulizer solution 2.5 mg  2.5 mg Nebulization Q6H PRN Theodoro Grist, MD      . ALPRAZolam Duanne Moron) tablet 1 mg  1 mg Oral q morning - 10a Theodoro Grist, MD    1 mg at 12/25/16 1033   And  . ALPRAZolam Duanne Moron) tablet 1.5 mg  1.5 mg Oral QHS Theodoro Grist, MD   1.5 mg at 12/24/16 2136  . baclofen (LIORESAL) tablet 10 mg  10 mg Oral TID Theodoro Grist, MD   10 mg at 12/25/16 1034  . fentaNYL (DURAGESIC - dosed mcg/hr) 50 mcg  50 mcg Transdermal Q72H Theodoro Grist, MD   50 mcg at 12/24/16 0086  . ferrous sulfate tablet 325 mg  325 mg Oral BID WC Bettey Costa, MD   325 mg at 12/25/16 1034  . fluticasone (FLONASE) 50 MCG/ACT nasal spray 1 spray  1 spray Each Nare BID Theodoro Grist, MD   1 spray at 12/24/16 2146  . folic acid (FOLVITE) tablet 1 mg  1 mg Oral Daily Theodoro Grist, MD   1 mg at 12/25/16 1036  . hydrOXYzine (ATARAX/VISTARIL) tablet 25 mg  25 mg Oral TID Theodoro Grist, MD   25 mg at 12/25/16 1038  . loratadine (CLARITIN) tablet 10 mg  10 mg Oral Daily Theodoro Grist, MD   10 mg at 12/25/16 1035  . metoprolol tartrate (LOPRESSOR) tablet 12.5 mg  12.5 mg Oral BID Bettey Costa, MD   12.5 mg at 12/25/16 1038  . mirtazapine (REMERON) tablet 45 mg  45 mg Oral QHS Theodoro Grist, MD   45 mg at 12/24/16 2136  . ondansetron (ZOFRAN) tablet 4 mg  4 mg Oral Q6H PRN Theodoro Grist, MD       Or  . ondansetron (ZOFRAN) injection 4 mg  4 mg Intravenous Q6H PRN Theodoro Grist, MD      . oxybutynin (DITROPAN) tablet 5 mg  5 mg Oral BID Theodoro Grist, MD   5 mg at 12/25/16 1034  . oxyCODONE (Oxy IR/ROXICODONE) immediate release tablet 5 mg  5 mg Oral Q6H PRN Henreitta Leber, MD   5 mg at 12/25/16 1243  . pantoprazole (PROTONIX) injection 40 mg  40 mg Intravenous Q12H Sital Mody, MD   40 mg at 12/25/16 1038  . potassium chloride (K-DUR,KLOR-CON) CR tablet 10 mEq  10 mEq Oral Daily Theodoro Grist, MD   10 mEq at 12/25/16 1035  . simvastatin (ZOCOR) tablet 40 mg  40 mg Oral q1800 Bettey Costa, MD   40 mg at 12/24/16 1851  . sodium chloride flush (NS) 0.9 % injection 3 mL  3 mL Intravenous Q12H Theodoro Grist, MD   3 mL at 12/25/16 1038  . sucralfate (CARAFATE) tablet 1 g  1 g  Oral TID WC & HS Theodoro Grist, MD   1 g at 12/25/16 1234  . venlafaxine XR (EFFEXOR-XR) 24 hr capsule 150 mg  150 mg Oral Q breakfast Theodoro Grist, MD   150 mg at 12/25/16 1036  . zolpidem (AMBIEN) tablet 5 mg  5 mg Oral QHS PRN Theodoro Grist, MD   5 mg at 12/24/16 2136    OBJECTIVE: Vitals:   12/25/16 0855 12/25/16  1316  BP: 139/79 101/65  Pulse: 93 81  Resp: 18 15  Temp:  98.2 F (36.8 C)     Body mass index is 49.43 kg/m.    ECOG FS:0 - Asymptomatic  General: Well-developed, well-nourished, no acute distress. Eyes: Pink conjunctiva, anicteric sclera. HEENT: Normocephalic, moist mucous membranes, clear oropharnyx. Lungs: Clear to auscultation bilaterally. Heart: Regular rate and rhythm. No rubs, murmurs, or gallops. Abdomen: Soft, nontender, nondistended. No organomegaly noted, normoactive bowel sounds. Musculoskeletal: No edema, cyanosis, or clubbing. Neuro: Alert, answering all questions appropriately. Cranial nerves grossly intact. Skin: No rashes or petechiae noted. Psych: Normal affect. Lymphatics: No cervical, calvicular, axillary or inguinal LAD.   LAB RESULTS:  Lab Results  Component Value Date   NA 137 12/24/2016   K 3.5 12/24/2016   CL 108 12/24/2016   CO2 25 12/24/2016   GLUCOSE 112 (H) 12/24/2016   BUN 25 (H) 12/24/2016   CREATININE 1.11 (H) 12/24/2016   CALCIUM 8.1 (L) 12/24/2016   PROT 6.9 12/23/2016   ALBUMIN 3.2 (L) 12/23/2016   AST 30 12/23/2016   ALT 16 12/23/2016   ALKPHOS 98 12/23/2016   BILITOT 0.6 12/23/2016   GFRNONAA 54 (L) 12/24/2016   GFRAA >60 12/24/2016    Lab Results  Component Value Date   WBC 7.0 12/24/2016   NEUTROABS 2.8 12/23/2016   HGB 9.1 (L) 12/25/2016   HCT 25.0 (L) 12/24/2016   MCV 69.8 (L) 12/24/2016   PLT 382 12/24/2016   Lab Results  Component Value Date   IRON 26 (L) 12/24/2016   TIBC 444 12/24/2016   IRONPCTSAT 6 (L) 12/24/2016   Lab Results  Component Value Date   FERRITIN 4 (L) 12/24/2016      STUDIES: Dg Chest 2 View  Result Date: 12/23/2016 CLINICAL DATA:  Generalized body aches and pain. Tachycardia and weakness. EXAM: CHEST  2 VIEW COMPARISON:  09/27/2016 and prior exams FINDINGS: Upper limits normal heart size and mild peribronchial thickening again noted. There is no evidence of focal airspace disease, pulmonary edema, suspicious pulmonary nodule/mass, pleural effusion, or pneumothorax. No acute bony abnormalities are identified. A thoracic stimulator is again noted. IMPRESSION: No active cardiopulmonary disease. Electronically Signed   By: Margarette Canada M.D.   On: 12/23/2016 11:09   Nm Pulmonary Perf And Vent  Result Date: 12/23/2016 CLINICAL DATA:  56 year old female with dyspnea. EXAM: NUCLEAR MEDICINE VENTILATION - PERFUSION LUNG SCAN TECHNIQUE: Ventilation images were obtained in multiple projections using inhaled aerosol Tc-76m DTPA. Perfusion images were obtained in multiple projections after intravenous injection of Tc-40m MAA. RADIOPHARMACEUTICALS:  32 mCi Technetium-42m DTPA aerosol inhalation and 4 mCi Technetium-31m MAA IV COMPARISON:  Chest radiograph dated 12/23/2016 FINDINGS: Ventilation: No large segmental ventilation defect. Area of decreased uptake noted in the left lower lobe which may be related to cardiac attenuation. Perfusion: No wedge shaped mismatch peripheral perfusion defects to suggest acute pulmonary embolism. There is overall better perfusion than ventilation. An area of decreased perfusion noted in the left lower lobe which is matched or smaller than corresponding ventilation defect and likely related to cardiac attenuation. IMPRESSION: Low probability for pulmonary embolism. Electronically Signed   By: Anner Crete M.D.   On: 12/23/2016 23:40    ASSESSMENT: Iron deficiency anemia, hematochezia, weakness, chest pain, dizziness.  PLAN:    1. Iron deficiency anemia: Likely multifactorial given patient's history of gastric bypass surgery with  possibly poor absorption as well as recent history of bloody stools. Appreciate GI input. Agree with colonoscopy in  the near future. Patient's symptoms have improved with 1 unit of packed red blood cells. Will also give 510 mg IV Feraheme today. We will do additional laboratory testing to complete the workup. No further intervention is needed at this time. Please ensure patient has follow-up in the [place] 2-3 weeks after discharge for repeat laboratory work and further evaluation. 2. Chest pain: Appreciate cardiology input. Outpatient stress test recommended.  Appreciate consult, call with questions.   Lloyd Huger, MD   12/25/2016 1:25 PM

## 2016-12-25 NOTE — Progress Notes (Signed)
Pt transferred to room 129. Report called to RN on floor.

## 2016-12-25 NOTE — Progress Notes (Signed)
[place] [facility] [place] NAME: Lindsey [name]    MR#:  [id number]  DATE OF BIRTH:  08/04/1961  SUBJECTIVE:   Feels better after transfusion. Had bright red blood in stool yesterday. Has been going on for 4 days. Does not take aspirin or Plavix at home.  REVIEW OF SYSTEMS:    Review of Systems  Constitutional: Positive for malaise/fatigue. Negative for chills and fever.  HENT: Negative.  Negative for ear discharge, ear pain, hearing loss, nosebleeds and sore throat.   Eyes: Negative.  Negative for blurred vision and pain.  Respiratory: Positive for shortness of breath. Negative for cough, hemoptysis and wheezing.   Cardiovascular: Negative.  Negative for chest pain, palpitations and leg swelling.  Gastrointestinal: Negative.  Negative for abdominal pain, blood in stool, diarrhea, nausea and vomiting.  Genitourinary: Negative.  Negative for dysuria.  Musculoskeletal: Negative.  Negative for back pain.  Skin: Negative.   Neurological: Positive for dizziness. Negative for tremors, speech change, focal weakness, seizures and headaches.  Endo/Heme/Allergies: Negative.  Does not bruise/bleed easily.  Psychiatric/Behavioral: Negative.  Negative for depression, hallucinations and suicidal ideas.   DRUG ALLERGIES:   Allergies  Allergen Reactions  . Morphine Hives  . Iodinated Diagnostic Agents Hives  . Aspirin Hives    Noted on MD progress notes and discussed with MD 09/02/15  . Etodolac Hives  . Ibuprofen Hives  . Shellfish Allergy Hives  . Tylenol [Acetaminophen] Hives    Upset stomach     VITALS:  Blood pressure 101/65, pulse 81, temperature 98.2 F (36.8 C), temperature source Oral, resp. rate 15, height 5\' 7"  (1.702 m), weight (!) 143.2 kg (315 lb 9.6 oz), SpO2 98 %.  PHYSICAL EXAMINATION:   Physical Exam    LABORATORY PANEL:   CBC  Recent Labs Lab 12/24/16 0546 12/25/16 0827  WBC 7.0  --   HGB 8.0* 9.1*  HCT 25.0*  --    PLT 382  --    ------------------------------------------------------------------------------------------------------------------  Chemistries   Recent Labs Lab 12/23/16 1043 12/24/16 0546  NA 138 137  K 4.4 3.5  CL 107 108  CO2 25 25  GLUCOSE 145* 112*  BUN 35* 25*  CREATININE 1.37* 1.11*  CALCIUM 8.5* 8.1*  AST 30  --   ALT 16  --   ALKPHOS 98  --   BILITOT 0.6  --    ------------------------------------------------------------------------------------------------------------------  Cardiac Enzymes  Recent Labs Lab 12/23/16 1043 12/23/16 1220  TROPONINI <0.03 <0.03   ------------------------------------------------------------------------------------------------------------------  RADIOLOGY:  Nm Pulmonary Perf And Vent  Result Date: 12/23/2016 CLINICAL DATA:  56 year old female with dyspnea. EXAM: NUCLEAR MEDICINE VENTILATION - PERFUSION LUNG SCAN TECHNIQUE: Ventilation images were obtained in multiple projections using inhaled aerosol Tc-78m DTPA. Perfusion images were obtained in multiple projections after intravenous injection of Tc-33m MAA. RADIOPHARMACEUTICALS:  32 mCi Technetium-33m DTPA aerosol inhalation and 4 mCi Technetium-70m MAA IV COMPARISON:  Chest radiograph dated 12/23/2016 FINDINGS: Ventilation: No large segmental ventilation defect. Area of decreased uptake noted in the left lower lobe which may be related to cardiac attenuation. Perfusion: No wedge shaped mismatch peripheral perfusion defects to suggest acute pulmonary embolism. There is overall better perfusion than ventilation. An area of decreased perfusion noted in the left lower lobe which is matched or smaller than corresponding ventilation defect and likely related to cardiac attenuation. IMPRESSION: Low probability for pulmonary embolism. Electronically Signed   By: Anner Crete M.D.   On: 12/23/2016 23:40     ASSESSMENT  AND PLAN:    56 year old female with a history of diabetes and COPD  status post gastric bypass several years ago and iron deficiency anemia who presents with chest pain, palpitations, dizziness and shortness of breath.  1. Hypotension most likely due to to weight loss and overmedication. Discontinued hypertensive medications with exception of low-dose metoprolol cortisol level - normal Low blood pressure may also be from anemia  2. Acute blood loss anemia, significant iron deficiency, symptomatic Received 1 unit PRBC Oncology evaluation for iron transfusion Last colonoscopy at age 36 which she reports was normal Start ferrous sulfate at discharge  Discussed Dr. Vicente Males of GI.We will repeat hemoglobin tomorrow. If hemoglobin continues to drop will need inpatient colonoscopy or outpatient procedure.  3. Chest pain This is resolved. Has follow-up with cardiology on Tuesday for outpatient workup in the office.  4. COPD without exacerbation  5. Stress incontinence on discharge plan 6. Depression: Continue Effexor, Remeron,  7. History of PUD on Carafate and PPI    Management plans discussed with the patient and she is in agreement.  CODE STATUS: full  TOTAL TIME TAKING CARE OF THIS PATIENT: 30 minutes.   POSSIBLE D/C 1-2 days, DEPENDING ON CLINICAL CONDITION.  Hillary Bow R M.D on 12/25/2016 at 2:42 PM  Between 7am to 6pm - Pager - [phone]  After 6pm go to [email or web address] - password [email or web address] [facility]  Office  [phone]  CC: Primary care physician; Volanda Napoleon, MD  Note: This dictation was prepared with Dragon dictation along with smaller phrase technology. Any transcriptional errors that result from this process are unintentional.

## 2016-12-26 DIAGNOSIS — D62 Acute posthemorrhagic anemia: Secondary | ICD-10-CM | POA: Diagnosis not present

## 2016-12-26 DIAGNOSIS — R0602 Shortness of breath: Secondary | ICD-10-CM | POA: Diagnosis not present

## 2016-12-26 LAB — HEMOGLOBINOPATHY EVALUATION
HGB C: 0 %
HGB S QUANTITAION: 0 %
Hgb A2 Quant: 1.8 % (ref 1.8–3.2)
Hgb A: 98.2 % (ref 96.4–98.8)
Hgb F Quant: 0 % (ref 0.0–2.0)

## 2016-12-26 LAB — HAPTOGLOBIN: Haptoglobin: 123 mg/dL (ref 34–200)

## 2016-12-26 LAB — GLUCOSE, CAPILLARY: Glucose-Capillary: 96 mg/dL (ref 65–99)

## 2016-12-26 LAB — HEMOGLOBIN: HEMOGLOBIN: 9.2 g/dL — AB (ref 12.0–16.0)

## 2016-12-26 MED ORDER — FERROUS SULFATE 325 (65 FE) MG PO TABS: 325.0000 mg | ORAL_TABLET | Freq: Two times a day (BID) | ORAL | 0 refills | Status: DC

## 2016-12-26 NOTE — Care Management Important Message (Signed)
Important Message  Patient Details  Name: Lindsey [name] MRN: [id number] Date of Birth: [birth year]   Medicare Important Message Given:  Yes    Shelbie Ammons, RN 12/26/2016, 12:08 PM

## 2016-12-26 NOTE — Progress Notes (Signed)
SUBJECTIVE: Patient reports she is feeling better with no chest pain or shortness of breath.   Vitals:   12/25/16 1316 12/25/16 2012 12/26/16 0437 12/26/16 0915  BP: 101/65 110/60 117/62 (!) 105/52  Pulse: 81 90 69 (!) 58  Resp: 15 20 20 18   Temp: 98.2 F (36.8 C) 97.5 F (36.4 C) 97.9 F (36.6 C) 98 F (36.7 C)  TempSrc: Oral Oral Oral Oral  SpO2: 98% 97% 98% 96%  Weight:      Height:        Intake/Output Summary (Last 24 hours) at 12/26/16 0947 Last data filed at 12/25/16 1900  Gross per 24 hour  Intake              357 ml  Output                0 ml  Net              357 ml    LABS: Basic Metabolic Panel:  Recent Labs  12/23/16 1043 12/24/16 0546  NA 138 137  K 4.4 3.5  CL 107 108  CO2 25 25  GLUCOSE 145* 112*  BUN 35* 25*  CREATININE 1.37* 1.11*  CALCIUM 8.5* 8.1*   Liver Function Tests:  Recent Labs  12/23/16 1043  AST 30  ALT 16  ALKPHOS 98  BILITOT 0.6  PROT 6.9  ALBUMIN 3.2*   No results for input(s): LIPASE, AMYLASE in the last 72 hours. CBC:  Recent Labs  12/23/16 1043 12/24/16 0546 12/25/16 0827 12/26/16 0424  WBC 6.4 7.0  --   --   NEUTROABS 2.8  --   --   --   HGB 9.0* 8.0* 9.1* 9.2*  HCT 29.3* 25.0*  --   --   MCV 71.0* 69.8*  --   --   PLT 415 382  --   --    Cardiac Enzymes:  Recent Labs  12/23/16 1043 12/23/16 1220  TROPONINI <0.03 <0.03   BNP: Invalid input(s): POCBNP D-Dimer: No results for input(s): DDIMER in the last 72 hours. Hemoglobin A1C: No results for input(s): HGBA1C in the last 72 hours. Fasting Lipid Panel: No results for input(s): CHOL, HDL, LDLCALC, TRIG, CHOLHDL, LDLDIRECT in the last 72 hours. Thyroid Function Tests:  Recent Labs  12/23/16 1255  TSH 1.133   Anemia Panel:  Recent Labs  12/24/16 0546 12/25/16 1426  VITAMINB12 483 521  FOLATE 36.0 43.0  FERRITIN 4*  --   TIBC 444  --   IRON 26*  --   RETICCTPCT 1.2  --      PHYSICAL EXAM General: Well developed, well nourished,  in no acute distress HEENT:  Normocephalic and atramatic Neck:  No JVD.  Lungs: Clear bilaterally to auscultation and percussion. Heart: HRRR . Normal S1 and S2 without gallops or murmurs.  Abdomen: Bowel sounds are positive, abdomen soft and non-tender  Msk:  Back normal, normal gait. Normal strength and tone for age. Extremities: No clubbing, cyanosis or edema.   Neuro: Alert and oriented X 3. Psych:  Good affect, responds appropriately  TELEMETRY: Not available.  ASSESSMENT AND PLAN: Chest pain has resolved. Continue low-dose metoprolol. Clear for discharge from cardiology perspective, follow-up outpatient with cardiology on Thursday 12/28/16  at 10am after discharge.  Active Problems:   Chest pain   Dyspnea   Palpitations   Symptomatic anemia    Jake Bathe, NP-C 12/26/2016 9:47 AM

## 2016-12-26 NOTE — Plan of Care (Signed)
MD making rounds. Received order to discharge home. IV removed. Prescription E-Scribed to pharmacy. Provided with education handout. Discharge paperwork provided, explained, signed and witnessed. No unanswered questions. Discharged via wheelchair by auxiliary staff. Belongings sent with patient and family.

## 2016-12-26 NOTE — Discharge Instructions (Addendum)
° °  Chest Wall Pain Introduction Chest wall pain is pain in or around the bones and muscles of your chest. Sometimes, an injury causes this pain. Sometimes, the cause may not be known. This pain may take several weeks or longer to get better. Follow these instructions at home: Pay attention to any changes in your symptoms. Take these actions to help with your pain:  Rest as told by your doctor.  Avoid activities that cause pain. Try not to use your chest, belly (abdominal), or side muscles to lift heavy things.  If directed, apply ice to the painful area:  Put ice in a plastic bag.  Place a towel between your skin and the bag.  Leave the ice on for 20 minutes, 2-3 times per day.  Take over-the-counter and prescription medicines only as told by your doctor.  Do not use tobacco products, including cigarettes, chewing tobacco, and e-cigarettes. If you need help quitting, ask your doctor.  Keep all follow-up visits as told by your doctor. This is important. Contact a doctor if:  You have a fever.  Your chest pain gets worse.  You have new symptoms. Get help right away if:  You feel sick to your stomach (nauseous) or you throw up (vomit).  You feel sweaty or light-headed.  You have a cough with phlegm (sputum) or you cough up blood.  You are short of breath. This information is not intended to replace advice given to you by your health care provider. Make sure you discuss any questions you have with your health care provider. Document Released: 05/01/2008 Document Revised: 04/20/2016 Document Reviewed: 02/08/2015  2017 Elsevier Resume diet and activity as before

## 2016-12-27 NOTE — Discharge Summary (Signed)
[place] at [place] NAME: Lindsey [name]    MR#:  [id number]  DATE OF BIRTH:  [birth year]  DATE OF ADMISSION:  12/23/2016 ADMITTING PHYSICIAN: Theodoro Grist, MD  DATE OF DISCHARGE: 12/26/2016  2:30 PM  PRIMARY CARE PHYSICIAN: Volanda Napoleon, MD   ADMISSION DIAGNOSIS:  Dyspnea [R06.00] Tachycardia [R00.0] Hypotension, unspecified hypotension type [I95.9] Chest pain, unspecified type [R07.9]  DISCHARGE DIAGNOSIS:  Active Problems:   Chest pain   Dyspnea   Palpitations   Symptomatic anemia   SECONDARY DIAGNOSIS:   Past Medical History:  Diagnosis Date  . Allergy   . COPD (chronic obstructive pulmonary disease) ([place])   . Diabetes mellitus without complication ([place])   . GERD (gastroesophageal reflux disease)   . Heart attack   . Hyperlipidemia   . Hypertension   . PUD (peptic ulcer disease)   . Sleep apnea   . Stroke [facility])      ADMITTING HISTORY  HISTORY OF PRESENT ILLNESS: Lindsey [name]  is a 56 y.o. female with a known history of Admission in November 2017 for syncope, which was felt to be due to dehydration, poor oral intake, at that time. 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 I pointed the patient, she was sitting and watching TV today in the morning when suddenly she became somewhat hot in the chest, noted that her heart was palpitating, she was also very short of breath. She also noted chest pain in the left side of the chest which was described as chest pressure 910 by intensity, intermittent discomfort radiating to the left neck area. There was no significant improvement with any movements or position, patient did however noted that this pain increased whenever she took a deep breath whenever she  walked around . She felt presyncopal. She called her grandson and asked him to call 911 because she was worried that she may pass out. On EMS arrival, her heart rate was 150. She was noted to be somewhat hypotensive and was brought to emergency room for further evaluation and treatment, where she received a few liters of IV fluids with some improvement of her heart rate to 101. She denies any significant shortness of breath is present and her O2 sats are 95%. Hospitalist services were contacted for admission   HOSPITAL COURSE:   56 year old female with a history of diabetes and COPD status post gastric bypass several years ago and iron deficiency anemia who presents with chest pain, palpitations, dizziness and shortness of breath.  1. Hypotension most likely due to to weight loss Discontinued hypertensive medications with exception of low-dose metoprolol cortisol level - normal  2. Acute blood loss anemia, significant iron deficiency, symptomatic Received 1 unit PRBC Oncology evaluation for iron transfusion. Patient received Feraheme 1 dose Last colonoscopy at age 77 which she reports was normal Started ferrous sulfate at discharge  Discussed Dr. Vicente Males of GI. With stable hemoglobin patient was set up for outpatient follow-up colonoscopy.  3. Chest pain This is resolved. Has follow-up with cardiology on Tuesday for outpatient workup in the office.  4. COPD without exacerbation  5. Stress incontinence  On Ditropan  6. Depression: Continue Effexor, Remeron   7. History of PUD on Carafate and PPI   Patient stable for discharge home to follow-up with cardiology  for outpatient stress test. And later with GI for an outpatient colonoscopy.  CONSULTS OBTAINED:  Treatment Team:  Dionisio David, MD Lloyd Huger, MD  DRUG ALLERGIES:   Allergies  Allergen Reactions  . Morphine Hives  . Iodinated Diagnostic Agents Hives  . Aspirin Hives    Noted on MD progress notes and  discussed with MD 09/02/15  . Etodolac Hives  . Ibuprofen Hives  . Shellfish Allergy Hives  . Tylenol [Acetaminophen] Hives    Upset stomach     DISCHARGE MEDICATIONS:   Discharge Medication List as of 12/26/2016  1:55 PM    START taking these medications   Details  ferrous sulfate 325 (65 FE) MG tablet Take 1 tablet (325 mg total) by mouth 2 (two) times daily with a meal., Starting Tue 12/26/2016, Normal      CONTINUE these medications which have NOT CHANGED   Details  albuterol (PROVENTIL HFA;VENTOLIN HFA) 108 (90 BASE) MCG/ACT inhaler Inhale 2 puffs into the lungs every 4 (four) hours as needed for wheezing or shortness of breath., Historical Med    albuterol (PROVENTIL) (2.5 MG/3ML) 0.083% nebulizer solution Take 2.5 mg by nebulization every 6 (six) hours as needed for wheezing or shortness of breath., Historical Med    ALPRAZolam (XANAX) 1 MG tablet Take 1-1.5 mg by mouth 2 (two) times daily. Pt takes one tablet in the morning and one and one-half tablet at night., Historical Med    baclofen (LIORESAL) 10 MG tablet Take 10 mg by mouth 3 (three) times daily., Historical Med    Calcium Lactate 648 MG TABS Take 648 mg by mouth 2 (two) times daily., Historical Med    cloNIDine (CATAPRES) 0.1 MG tablet Take 0.1 mg by mouth at bedtime. , Historical Med    !! desvenlafaxine (PRISTIQ) 100 MG 24 hr tablet Take 100 mg by mouth daily. Pt takes 100 mg tab and 50 mg tab for total of 150 mg, Historical Med    !! desvenlafaxine (PRISTIQ) 50 MG 24 hr tablet Take 50 mg by mouth daily. Pt takes 100 mg tab and 50 mg tab for total of 150 mg, Historical Med    esomeprazole (NEXIUM) 40 MG capsule Take 40 mg by mouth 2 (two) times daily., Historical Med    fentaNYL (DURAGESIC - DOSED MCG/HR) 50 MCG/HR Place 50 mcg onto the skin every 3 (three) days., Historical Med    fexofenadine (ALLEGRA) 60 MG tablet Take 60 mg by mouth 2 (two) times daily., Historical Med    fluticasone (FLONASE) 50 MCG/ACT  nasal spray Place 1 spray into both nostrils 2 (two) times daily., Historical Med    folic acid (FOLVITE) 1 MG tablet Take 1 mg by mouth daily., Historical Med    hydrOXYzine (ATARAX/VISTARIL) 25 MG tablet Take 25 mg by mouth 3 (three) times daily., Historical Med    l-methylfolate-B6-B12 (METANX) 3-35-2 MG TABS tablet Take 1 tablet by mouth 2 (two) times daily., Historical Med    metoprolol succinate (TOPROL-XL) 100 MG 24 hr tablet Take 100 mg by mouth daily. Take with or immediately following a meal., Historical Med    mirtazapine (REMERON) 45 MG tablet Take 45 mg by mouth at bedtime., Historical Med    olmesartan-hydrochlorothiazide (BENICAR HCT) 40-12.5 MG tablet Take 1 tablet by mouth daily., Historical Med    oxybutynin (DITROPAN) 5 MG tablet Take 5 mg by mouth 2 (two) times daily., Historical Med    oxyCODONE (OXY IR/ROXICODONE) 5 MG immediate release tablet Take 5  mg by mouth every 6 (six) hours as needed for severe pain., Historical Med    potassium chloride (K-DUR) 10 MEQ tablet Take 10 mEq by mouth daily., Historical Med    simvastatin (ZOCOR) 40 MG tablet Take 1 tablet by mouth daily., Starting Sun 09/24/2016, Historical Med    sucralfate (CARAFATE) 1 g tablet Take 1 tablet (1 g total) by mouth 4 (four) times daily -  with meals and at bedtime., Starting Fri 09/29/2016, Normal    zolpidem (AMBIEN) 10 MG tablet Take 10 mg by mouth at bedtime as needed for sleep., Historical Med    ONGLYZA 5 MG TABS tablet Take 1 tablet by mouth daily., Starting Mon 08/28/2016, Historical Med    Vitamin D, Ergocalciferol, (DRISDOL) 50000 units CAPS capsule Take 1 capsule (50,000 Units total) by mouth every 7 (seven) days., Starting Thu 10/05/2016, Normal     !! - Potential duplicate medications found. Please discuss with provider.    STOP taking these medications     clopidogrel (PLAVIX) 75 MG tablet         Today   VITAL SIGNS:  Blood pressure (!) 105/52, pulse (!) 58, temperature 98  F (36.7 C), temperature source Oral, resp. rate 18, height 5\' 7"  (1.702 m), weight (!) 143.2 kg (315 lb 9.6 oz), SpO2 96 %.  I/O:  No intake or output data in the 24 hours ending 12/27/16 1533  PHYSICAL EXAMINATION:  Physical Exam  GENERAL:  56 y.o.-year-old patient lying in the bed with no acute distress. obese LUNGS: Normal breath sounds bilaterally, no wheezing, rales,rhonchi or crepitation. No use of accessory muscles of respiration.  CARDIOVASCULAR: S1, S2 normal. No murmurs, rubs, or gallops.  ABDOMEN: Soft, non-tender, non-distended. Bowel sounds present. No organomegaly or mass.  NEUROLOGIC: Moves all 4 extremities. PSYCHIATRIC: The patient is alert and oriented x 3.  SKIN: No obvious rash, lesion, or ulcer.   DATA REVIEW:   CBC  Recent Labs Lab 12/24/16 0546  12/26/16 0424  WBC 7.0  --   --   HGB 8.0*  < > 9.2*  HCT 25.0*  --   --   PLT 382  --   --   < > = values in this interval not displayed.  Chemistries   Recent Labs Lab 12/23/16 1043 12/24/16 0546  NA 138 137  K 4.4 3.5  CL 107 108  CO2 25 25  GLUCOSE 145* 112*  BUN 35* 25*  CREATININE 1.37* 1.11*  CALCIUM 8.5* 8.1*  AST 30  --   ALT 16  --   ALKPHOS 98  --   BILITOT 0.6  --     Cardiac Enzymes  Recent Labs Lab 12/23/16 1220  TROPONINI <0.03    Microbiology Results  Results for orders placed or performed in visit on 06/29/14  Urine culture     Status: None   Collection Time: 06/30/14 11:55 PM  Result Value Ref Range Status   Micro Text Report   Final       SOURCE: CLEAN CATCH    ORGANISM 1                >100,000 CFU/ML ESCHERICHIA COLI   ORGANISM 2                20,000 CFU/ML ESCHERICHIA COLI   COMMENT                   TWO DIFFERENT COLONY TYPES TWO DIFFERENT   COMMENT  SENSITIVITY PATTERNS   ANTIBIOTIC                    ORG#1    ORG#2     AMPICILLIN                    S        R         CEFAZOLIN                     S        S         CEFOXITIN                      S        I         CEFTRIAXONE                   S        S         CIPROFLOXACIN                 S        S         GENTAMICIN                    S        S         IMIPENEM                      S        S         LEVOFLOXACIN                  S        S         NITROFURANTOIN                I        I         TRIMETHOPRIM/SULFAMETHOXAZOLE S        S             RADIOLOGY:  No results found.  Follow up with PCP in 1 week.  Management plans discussed with the patient, family and they are in agreement.  CODE STATUS:  Code Status History    Date Active Date Inactive Code Status Order ID Comments User Context   12/23/2016  6:18 PM 12/26/2016  6:44 PM Full Code [id number]  Theodoro Grist, MD Inpatient   09/27/2016 11:28 AM 09/29/2016  1:30 PM Full Code [id number]  Saundra Shelling, MD ED   08/31/2015  5:48 PM 09/02/2015  7:52 PM Full Code [id number]  Nicholes Mango, MD ED    Advance Directive Documentation   Flowsheet Row Most Recent Value  Type of Advance Directive  Healthcare Power of Attorney  Pre-existing out of facility DNR order (yellow form or pink MOST form)  No data  "MOST" Form in Place?  No data      TOTAL TIME TAKING CARE OF THIS PATIENT ON DAY OF DISCHARGE: more than 30 minutes.   Hillary Bow R M.D on 12/27/2016 at 3:33 PM  Between 7am to 6pm - Pager - [phone]  After 6pm go to [email or web address] - password [email or web address] [facility]  Office  [phone]  CC: Primary care physician; Volanda Napoleon, MD  Note: This  dictation was prepared with Dragon dictation along with smaller phrase technology. Any transcriptional errors that result from this process are unintentional.

## 2017-01-02 ENCOUNTER — Ambulatory Visit: Payer: Self-pay | Admitting: Gastroenterology

## 2017-01-23 ENCOUNTER — Emergency Department: Payer: Medicare Other

## 2017-01-23 ENCOUNTER — Emergency Department
Admission: EM | Admit: 2017-01-23 | Discharge: 2017-01-23 | Disposition: A | Discharge status: Home / Self Care | Payer: Medicare Other | Attending: Emergency Medicine | Admitting: Emergency Medicine

## 2017-01-23 ENCOUNTER — Other Ambulatory Visit: Payer: Self-pay | Admitting: *Deleted

## 2017-01-23 DIAGNOSIS — E119 Type 2 diabetes mellitus without complications: Secondary | ICD-10-CM | POA: Diagnosis not present

## 2017-01-23 DIAGNOSIS — R42 Dizziness and giddiness: Secondary | ICD-10-CM | POA: Diagnosis present

## 2017-01-23 DIAGNOSIS — J449 Chronic obstructive pulmonary disease, unspecified: Secondary | ICD-10-CM | POA: Diagnosis not present

## 2017-01-23 DIAGNOSIS — Z79899 Other long term (current) drug therapy: Secondary | ICD-10-CM | POA: Insufficient documentation

## 2017-01-23 DIAGNOSIS — J322 Chronic ethmoidal sinusitis: Secondary | ICD-10-CM | POA: Insufficient documentation

## 2017-01-23 DIAGNOSIS — I10 Essential (primary) hypertension: Secondary | ICD-10-CM | POA: Insufficient documentation

## 2017-01-23 LAB — COMPREHENSIVE METABOLIC PANEL
ALT: 25 U/L (ref 14–54)
AST: 35 U/L (ref 15–41)
Albumin: 3.5 g/dL (ref 3.5–5.0)
Alkaline Phosphatase: 110 U/L (ref 38–126)
Anion gap: 11 (ref 5–15)
BUN: 24 mg/dL — AB (ref 6–20)
CHLORIDE: 107 mmol/L (ref 101–111)
CO2: 22 mmol/L (ref 22–32)
CREATININE: 1.38 mg/dL — AB (ref 0.44–1.00)
Calcium: 8.6 mg/dL — ABNORMAL LOW (ref 8.9–10.3)
GFR calc non Af Amer: 42 mL/min — ABNORMAL LOW (ref >60)
GFR, EST AFRICAN AMERICAN: 48 mL/min — AB (ref >60)
Glucose, Bld: 128 mg/dL — ABNORMAL HIGH (ref 65–99)
Potassium: 4.1 mmol/L (ref 3.5–5.1)
SODIUM: 140 mmol/L (ref 135–145)
Total Bilirubin: 0.2 mg/dL — ABNORMAL LOW (ref 0.3–1.2)
Total Protein: 7.1 g/dL (ref 6.5–8.1)

## 2017-01-23 LAB — CBC WITH DIFFERENTIAL/PLATELET
BASOS PCT: 1 %
Basophils Absolute: 0.1 10*3/uL (ref 0–0.1)
Eosinophils Absolute: 0.2 10*3/uL (ref 0–0.7)
Eosinophils Relative: 2 %
HCT: 39.2 % (ref 35.0–47.0)
HEMOGLOBIN: 12.6 g/dL (ref 12.0–16.0)
LYMPHS ABS: 3.9 10*3/uL — AB (ref 1.0–3.6)
LYMPHS PCT: 44 %
MCH: 25 pg — ABNORMAL LOW (ref 26.0–34.0)
MCHC: 32.1 g/dL (ref 32.0–36.0)
MCV: 78 fL — AB (ref 80.0–100.0)
MONO ABS: 0.6 10*3/uL (ref 0.2–0.9)
MONOS PCT: 7 %
Neutro Abs: 3.9 10*3/uL (ref 1.4–6.5)
Neutrophils Relative %: 46 %
Platelets: 275 10*3/uL (ref 150–440)
RBC: 5.02 MIL/uL (ref 3.80–5.20)
RDW: 27.3 % — AB (ref 11.5–14.5)
WBC: 8.7 10*3/uL (ref 3.6–11.0)

## 2017-01-23 LAB — TROPONIN I: Troponin I: <0.03 ng/mL (ref <0.03)

## 2017-01-23 MED ORDER — MECLIZINE HCL 25 MG PO TABS: 25.0000 mg | ORAL_TABLET | Freq: Three times a day (TID) | ORAL | 1 refills | Status: DC | PRN

## 2017-01-23 MED ORDER — MECLIZINE HCL 25 MG PO TABS
50.0000 mg | ORAL_TABLET | Freq: Once | ORAL | Status: AC
  Administered 2017-01-23: 50 mg via ORAL
  Filled 2017-01-23: qty 2

## 2017-01-23 MED ORDER — DIAZEPAM 5 MG PO TABS
5.0000 mg | ORAL_TABLET | Freq: Once | ORAL | Status: AC
  Administered 2017-01-23: 5 mg via ORAL
  Filled 2017-01-23: qty 1

## 2017-01-23 MED ORDER — AMOXICILLIN 500 MG PO CAPS: 500.0000 mg | ORAL_CAPSULE | Freq: Three times a day (TID) | ORAL | 0 refills | Status: DC

## 2017-01-23 MED ORDER — SODIUM CHLORIDE 0.9 % IV SOLN
Freq: Once | INTRAVENOUS | Status: AC
Start: 2017-01-23 — End: 2017-01-23
  Administered 2017-01-23: 09:00:00 via INTRAVENOUS

## 2017-01-23 MED ORDER — DIAZEPAM 5 MG PO TABS: ORAL_TABLET | ORAL | 0 refills | Status: DC

## 2017-01-23 NOTE — ED Provider Notes (Signed)
[facility], [place] Emergency Department Provider Note        Time seen: ----------------------------------------- 7:48 AM on 01/23/2017 -----------------------------------------    I have reviewed Lindsey triage vital signs and [place] nursing notes.   HISTORY  Chief Complaint Chest Pain; GI Bleeding; and Dizziness    HPI Lindsey [name] is a 56 y.o. female who presents to ER if she woke up at 4 AM this morning with dizziness. Patient describes room spinning [name]. Patient worse when back to bed and fell asleep but she woke up at 6:15 with some chest discomfort, dizziness and a burning [name]. She was also diaphoretic she states. Patient states her eyes were red at that time. Patient has had room spinning [name] before. Patient reports recent GI bleeding.   Past Medical History:  Diagnosis Date  . Allergy   . COPD (chronic obstructive pulmonary disease) ([place])   . Diabetes mellitus without complication ([place])   . GERD (gastroesophageal reflux disease)   . Heart attack   . Hyperlipidemia   . Hypertension   . PUD (peptic ulcer disease)   . Sleep apnea   . Stroke [facility])     Patient Active Problem List   Diagnosis Date Noted  . Symptomatic anemia 12/24/2016  . Chest pain 12/23/2016  . Dyspnea 12/23/2016  . Palpitations 12/23/2016  . Hypotension 09/29/2016  . Renal insufficiency 09/29/2016  . Anemia 09/29/2016  . Leukocytosis 09/29/2016  . Prediabetes 09/29/2016  . Jejunal inflammation 09/29/2016  . Abnormal findings on esophagogastroduodenoscopy (EGD) 09/29/2016  . Vitamin D deficiency 09/29/2016  . Syncope 09/27/2016  . Seizures ([place]) 09/02/2015  . 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 Disease of female genital organs 04/01/2013  . Difficult or painful urination 03/18/2013  . Urge incontinence 03/18/2013  . Urgency of micturation 03/18/2013    Past Surgical History:  Procedure Laterality Date  .  ABDOMINAL HYSTERECTOMY    . back surgery    . broken wrist    . ESOPHAGOGASTRODUODENOSCOPY (EGD) WITH PROPOFOL N/A 09/28/2016   Procedure: ESOPHAGOGASTRODUODENOSCOPY (EGD) WITH PROPOFOL;  Surgeon: Manya Silvas, MD;  Location: [facility] ENDOSCOPY;  Service: Endoscopy;  Laterality: N/A;  . GALLBLADDER SURGERY    . GASTRIC BYPASS    . HERNIA REPAIR    . REPLACEMENT TOTAL KNEE    . ROTATOR CUFF REPAIR      Allergies Morphine; Iodinated diagnostic agents; Aspirin; Etodolac; Ibuprofen; Shellfish allergy; and Tylenol [acetaminophen]  Social History Social History  Substance Use Topics  . Smoking status: Never Smoker  . Smokeless tobacco: Never Used  . Alcohol use No    Review of Systems Constitutional: Negative for fever. Eyes: Positive for red eyes Cardiovascular: Positive for chest pain Respiratory: Negative for shortness of breath. Gastrointestinal: Negative for abdominal pain, vomiting and diarrhea. Genitourinary: Negative for dysuria. Musculoskeletal: Negative for back pain. Skin: Positive for diaphoresis Neurological: Negative for headaches, focal weakness or numbness.Positive for dizziness  10-point ROS otherwise negative.  ____________________________________________   PHYSICAL EXAM:  VITAL SIGNS: ED Triage Vitals [01/23/17 0707]  Enc Vitals Group     BP 123/78     Pulse Rate 82     Resp 20     Temp 98.9 F (37.2 C)     Temp Source Oral     SpO2 100 %     Weight 298 lb (135.2 kg)     Height 5\' 7"  (1.702 m)     Head Circumference      Peak  Flow      Pain Score 5     Pain Loc      Pain Edu?      Excl. in [place]?     Constitutional: Alert and oriented. Anxious, no distress Eyes: Conjunctivae are normal. PERRL. Normal extraocular movements. ENT   Head: Normocephalic and atraumatic.   Nose: No congestion/rhinnorhea.   Mouth/Throat: Mucous membranes are moist.   Neck: No stridor. Cardiovascular: Normal rate, regular rhythm. No murmurs, rubs, or  gallops. Respiratory: Normal respiratory effort without tachypnea nor retractions. Breath sounds are clear and equal bilaterally. No wheezes/rales/rhonchi. Gastrointestinal: Soft and nontender. Normal bowel sounds Musculoskeletal: Nontender with normal range of motion in all extremities. No lower extremity tenderness nor edema. Neurologic:  Normal speech and language. No gross focal neurologic deficits are appreciated.  Skin:  Skin is warm, dry and intact. No rash noted. Psychiatric: Mood and affect are normal. Speech and behavior are normal.  ____________________________________________  EKG: Interpreted by me. Sinus rhythm rate of 70 bpm, normal. We'll, normal QRS, normal QT.  ____________________________________________  ED COURSE:  Pertinent labs & imaging results that were available during my care of [place] patient were reviewed by me and considered in my medical decision making (see chart for details). Patient presents to ER with symptoms of vertigo. We will assess with labs and possibly imaging. She received meclizine, Valium and fluids.   Procedures ____________________________________________   LABS (pertinent positives/negatives)  Labs Reviewed  CBC WITH DIFFERENTIAL/PLATELET - Abnormal; Notable for [place] following:       Result Value   MCV 78.0 (*)    MCH 25.0 (*)    RDW 27.3 (*)    Lymphs Abs 3.9 (*)    All other components within normal limits  COMPREHENSIVE METABOLIC PANEL - Abnormal; Notable for [place] following:    Glucose, Bld 128 (*)    BUN 24 (*)    Creatinine, Ser 1.38 (*)    Calcium 8.6 (*)    Total Bilirubin 0.2 (*)    GFR calc non Af Amer 42 (*)    GFR calc Af Amer 48 (*)    All other components within normal limits  TROPONIN I    RADIOLOGY Images were viewed by me  CT head IMPRESSION: Paranasal sinus disease, most notably in Lindsey right ethmoid region. No intracranial mass hemorrhage, or extra-axial fluid collection. Gray-white compartments are normal.  There is mild invagination of CSF into Lindsey sella. ____________________________________________  FINAL ASSESSMENT AND PLAN  Vertigo, anxiety, Sinusitis  Plan: Patient with labs and imaging as dictated above. Patient is, feeling better, she has had vertigo likely secondary to her sinus issues. She does not note any recent illness, this is possibly secondary to allergies. She was discharged with meclizine, Valium and Flonase. She is stable for outpatient follow-up with her doctor.   Earleen Newport, MD   Note: This note was generated in part or whole with voice recognition software. Voice recognition is usually quite accurate but there are transcription errors that can and very often do occur. I apologize for any typographical errors that were not detected and corrected.     Earleen Newport, MD 01/23/17 [phone]

## 2017-01-23 NOTE — ED Notes (Signed)
Pt discharged home after verbalizing understanding of discharge instructions; nad noted. 

## 2017-01-23 NOTE — ED Triage Notes (Signed)
Pt states she woke this morning at 0400 w/ dizziness. Pt went to bathrrom, went back to bed and fell asleep. Pt states she woke at 0615 w/ chest pain, dizziness, and all over burning sensation under skin. Pt presently sates that burning sensation is starting again and she is visibly anxious. Pt is fixated on the fact that when she woke at 0615, her sclera were red and she states they are never red.

## 2017-01-24 ENCOUNTER — Ambulatory Visit (INDEPENDENT_AMBULATORY_CARE_PROVIDER_SITE_OTHER): Payer: Medicare Other | Admitting: Gastroenterology

## 2017-01-24 ENCOUNTER — Other Ambulatory Visit: Payer: Self-pay

## 2017-01-24 ENCOUNTER — Encounter: Payer: Self-pay | Admitting: Gastroenterology

## 2017-01-24 VITALS — BP 116/76 | HR 56 | Ht 67.0 in | Wt 305.0 lb

## 2017-01-24 DIAGNOSIS — D509 Iron deficiency anemia, unspecified: Secondary | ICD-10-CM | POA: Diagnosis not present

## 2017-01-24 DIAGNOSIS — K625 Hemorrhage of anus and rectum: Secondary | ICD-10-CM

## 2017-01-24 NOTE — Progress Notes (Signed)
Primary Care Physician: Volanda Napoleon, MD  Primary Gastroenterologist:  Dr. Jonathon Bellows   Chief Complaint  Patient presents with  . Hospitalization Follow-up    rectal bleeding    HPI: [place] [name] is a 56 y.o. female.  She was admitted to the hospital on 12/23/16 . She has ahistory of gastric bypass, GERD,CAD,DM. She has a history of anemia partially worked up in 2013-2014 .   During her admission for tachycardia ,hypotension which improved with hydration. She was noted to have bright red blood in her toilet bowel after wiping.  At that time her Hb was 9 grams, MCV 78, ferritin of 4 , normal B12,folate ,urine analysis   . No overt blood loss was noted. Plan was for outpatient endoscopy .  She was given a dose of IV iron during her hospitalization.   EGD 09/2016- normal   Still having rectal bleeding   Rectal bleeding :  Onset and where was blood seen  :few weeks back , blood mixed in the stool  Frequency of bowel movements :twice a day  Consistency : normal  Change in shape of stool:no -green in color  Pain associated with bowel movements:no  Blood thinner usage:none  NSAID's: none  Prior colonoscopy : cant recall ?> 10 years  Family history of colon cancer or polyps:none  Weight loss:losing weight - 20 lbs in 2 weeks.   Current Outpatient Prescriptions  Medication Sig Dispense Refill  . albuterol (PROVENTIL HFA;VENTOLIN HFA) 108 (90 BASE) MCG/ACT inhaler Inhale 2 puffs into the lungs every 4 (four) hours as needed for wheezing or shortness of breath.    Marland Kitchen albuterol (PROVENTIL) (2.5 MG/3ML) 0.083% nebulizer solution Take 2.5 mg by nebulization every 6 (six) hours as needed for wheezing or shortness of breath.    . ALPRAZolam (XANAX) 1 MG tablet Take 1-1.5 mg by mouth 2 (two) times daily. Pt takes one tablet in the morning and one and one-half tablet at night.    Marland Kitchen amoxicillin (AMOXIL) 500 MG capsule Take 1 capsule (500 mg total) by mouth 3 (three) times  daily. 30 capsule 0  . baclofen (LIORESAL) 10 MG tablet Take 10 mg by mouth 3 (three) times daily.    . Calcium Carbonate-Vitamin D (OYSTER SHELL CALCIUM 500 + D PO)   0  . Calcium Lactate 648 MG TABS Take 648 mg by mouth 2 (two) times daily.    . cloNIDine (CATAPRES) 0.1 MG tablet Take 0.1 mg by mouth at bedtime.     Marland Kitchen desvenlafaxine (PRISTIQ) 100 MG 24 hr tablet Take 100 mg by mouth daily. Pt takes 100 mg tab and 50 mg tab for total of 150 mg    . desvenlafaxine (PRISTIQ) 50 MG 24 hr tablet Take 50 mg by mouth daily. Pt takes 100 mg tab and 50 mg tab for total of 150 mg    . diazepam (VALIUM) 5 MG tablet To be taken 3 times daily with Antivert for vertigo as needed 20 tablet 0  . esomeprazole (NEXIUM) 40 MG capsule Take 40 mg by mouth 2 (two) times daily.    . fentaNYL (DURAGESIC - DOSED MCG/HR) 50 MCG/HR Place 50 mcg onto the skin every 3 (three) days.    . ferrous sulfate 325 (65 FE) MG tablet Take 1 tablet (325 mg total) by mouth 2 (two) times daily with a meal. 60 tablet 0  . fexofenadine (ALLEGRA) 60 MG tablet Take 60 mg by mouth 2 (two) times daily.    Marland Kitchen  fluticasone (FLONASE) 50 MCG/ACT nasal spray Place 1 spray into both nostrils 2 (two) times daily.    . folic acid (FOLVITE) 1 MG tablet Take 1 mg by mouth daily.    . hydrOXYzine (ATARAX/VISTARIL) 25 MG tablet Take 25 mg by mouth 3 (three) times daily.    Marland Kitchen l-methylfolate-B6-B12 (METANX) 3-35-2 MG TABS tablet Take 1 tablet by mouth 2 (two) times daily.    . meclizine (ANTIVERT) 25 MG tablet Take 1 tablet (25 mg total) by mouth 3 (three) times daily as needed for dizziness or nausea. 30 tablet 1  . metoprolol succinate (TOPROL-XL) 100 MG 24 hr tablet Take 100 mg by mouth daily. Take with or immediately following a meal.    . mirtazapine (REMERON) 45 MG tablet Take 45 mg by mouth at bedtime.    . nitroGLYCERIN (NITROSTAT) 0.3 MG SL tablet Place under the tongue.    Marland Kitchen olmesartan-hydrochlorothiazide (BENICAR HCT) 40-12.5 MG tablet Take 1  tablet by mouth daily.    . ONGLYZA 5 MG TABS tablet Take 1 tablet by mouth daily.    Marland Kitchen oxybutynin (DITROPAN) 5 MG tablet Take 5 mg by mouth 2 (two) times daily.    Marland Kitchen oxyCODONE (OXY IR/ROXICODONE) 5 MG immediate release tablet Take 5 mg by mouth every 6 (six) hours as needed for severe pain.    . potassium chloride (K-DUR) 10 MEQ tablet Take 10 mEq by mouth daily.    . simvastatin (ZOCOR) 40 MG tablet Take 1 tablet by mouth daily.    . sucralfate (CARAFATE) 1 g tablet Take 1 tablet (1 g total) by mouth 4 (four) times daily -  with meals and at bedtime. 120 tablet 5  . Vitamin D, Ergocalciferol, (DRISDOL) 50000 units CAPS capsule Take 1 capsule (50,000 Units total) by mouth every 7 (seven) days. 30 capsule 5  . zolpidem (AMBIEN) 10 MG tablet Take 10 mg by mouth at bedtime as needed for sleep.     No current facility-administered medications for this visit.     Allergies as of 01/24/2017 - Review Complete 01/24/2017  Allergen Reaction Noted  . Morphine Hives 04/12/2015  . Iodinated diagnostic agents Hives 04/12/2015  . Aspirin Hives 09/02/2015  . Etodolac Hives 04/12/2015  . Ibuprofen Hives 04/12/2015  . Shellfish allergy Hives 04/12/2015  . Tylenol [acetaminophen] Hives 09/27/2016    ROS:  General: Negative for anorexia, weight loss, fever, chills, fatigue, weakness. ENT: Negative for hoarseness, difficulty swallowing , nasal congestion. CV: Negative for chest pain, angina, palpitations, dyspnea on exertion, peripheral edema.  Respiratory: Negative for dyspnea at rest, dyspnea on exertion, cough, sputum, wheezing.  GI: See history of present illness. GU:  Negative for dysuria, hematuria, urinary incontinence, urinary frequency, nocturnal urination.  Endo: Negative for unusual weight change.    Physical Examination:   BP 116/76   Pulse (!) 56   Ht 5\' 7"  (1.702 m)   Wt (!) 305 lb (138.3 kg)   BMI 47.77 kg/m   General: Well-nourished, well-developed in no acute distress. Obese    Eyes: No icterus. Conjunctivae pink. Mouth: Oropharyngeal mucosa moist and pink , no lesions erythema or exudate. Lungs: Clear to auscultation bilaterally. Non-labored. Heart: Regular rate and rhythm, no murmurs rubs or gallops.  Abdomen: Bowel sounds are normal, nontender, nondistended, no hepatosplenomegaly or masses, no abdominal bruits or hernia , no rebound or guarding.   Extremities: No lower extremity edema. No clubbing or deformities. Neuro: Alert and oriented x 3.  Grossly intact. Skin: Warm and dry,  no jaundice.   Psych: Alert and cooperative, normal mood and affect.   Imaging Studies: Ct Head Wo Contrast  Result Date: 01/23/2017 CLINICAL DATA:  Headache and dizziness for 1 day EXAM: CT HEAD WITHOUT CONTRAST TECHNIQUE: Contiguous axial images were obtained from the base of the skull through the vertex without intravenous contrast. COMPARISON:  September 01, 2015 FINDINGS: Brain: The ventricles are normal in size and configuration. There is mild invagination of CSF into the sella. There is no appreciable intracranial mass, hemorrhage, extra-axial fluid collection, or midline shift. Gray-white compartments appear normal. No acute infarct evident. Vascular: No hyperdense vessel. There is no appreciable vascular calcification. Skull: The bony calvarium appears intact. Sinuses/Orbits: There is opacification of a right midportion ethmoid air cell. There is mucosal thickening in several ethmoid air cells bilaterally, more notable on the right than on the left. Visualized paranasal sinuses elsewhere are clear. Visualized orbits appear symmetric bilaterally. Other: Visualized mastoid air cells are clear. IMPRESSION: Paranasal sinus disease, most notably in the right ethmoid region. No intracranial mass hemorrhage, or extra-axial fluid collection. Gray-white compartments are normal. There is mild invagination of CSF into the sella. Electronically Signed   By: Lowella Grip III M.D.   On: 01/23/2017  08:18    Assessment and Plan:   [place] [name] is a 56 y.o. y/o female here for a hospital follow up . She had her gastric bypass in 2007 . Her anemia is very likely due to the inability of oral iron to be absorbed due to lack of stomach acid to convert oral iron in its ferrous state to the ferric state to aid absorption. She has no acid in the stomach due to the bypass. She will need long term IV iron. She has some rectal bleeding which is likely hemorrhoidal but I will proceed with a colonoscopy. IF negative I will performed a capsule study of the small Bowel to r/o small bowel lesions causing anemia.   Plan  1. Celiac serology  2. Colonoscopy and if negative capsule study with prior SBFT   I have discussed alternative options, risks & benefits,  which include, but are not limited to, bleeding, infection, perforation,respiratory complication & drug reaction.  The patient agrees with this plan & written consent will be obtained.     Dr Jonathon Bellows  MD F/u in 6 weeks

## 2017-01-30 ENCOUNTER — Ambulatory Visit: Payer: Medicare Other | Admitting: Anesthesiology

## 2017-01-30 ENCOUNTER — Ambulatory Visit
Admission: RE | Admit: 2017-01-30 | Discharge: 2017-01-30 | Disposition: A | Discharge status: Home / Self Care | Payer: Medicare Other | Source: Ambulatory Visit | Attending: Gastroenterology | Admitting: Gastroenterology

## 2017-01-30 ENCOUNTER — Encounter
Admission: RE | Disposition: A | Discharge status: Home / Self Care | Payer: Self-pay | Source: Ambulatory Visit | Attending: Gastroenterology

## 2017-01-30 DIAGNOSIS — G4730 Sleep apnea, unspecified: Secondary | ICD-10-CM | POA: Diagnosis not present

## 2017-01-30 DIAGNOSIS — K219 Gastro-esophageal reflux disease without esophagitis: Secondary | ICD-10-CM | POA: Diagnosis not present

## 2017-01-30 DIAGNOSIS — Z79899 Other long term (current) drug therapy: Secondary | ICD-10-CM | POA: Diagnosis not present

## 2017-01-30 DIAGNOSIS — Z96659 Presence of unspecified artificial knee joint: Secondary | ICD-10-CM | POA: Insufficient documentation

## 2017-01-30 DIAGNOSIS — D509 Iron deficiency anemia, unspecified: Secondary | ICD-10-CM | POA: Diagnosis present

## 2017-01-30 DIAGNOSIS — I10 Essential (primary) hypertension: Secondary | ICD-10-CM | POA: Diagnosis not present

## 2017-01-30 DIAGNOSIS — J449 Chronic obstructive pulmonary disease, unspecified: Secondary | ICD-10-CM | POA: Insufficient documentation

## 2017-01-30 DIAGNOSIS — K625 Hemorrhage of anus and rectum: Secondary | ICD-10-CM

## 2017-01-30 DIAGNOSIS — E785 Hyperlipidemia, unspecified: Secondary | ICD-10-CM | POA: Diagnosis not present

## 2017-01-30 DIAGNOSIS — Z6841 Body Mass Index (BMI) 40.0 and over, adult: Secondary | ICD-10-CM | POA: Insufficient documentation

## 2017-01-30 DIAGNOSIS — Z9884 Bariatric surgery status: Secondary | ICD-10-CM | POA: Diagnosis not present

## 2017-01-30 DIAGNOSIS — Z7984 Long term (current) use of oral hypoglycemic drugs: Secondary | ICD-10-CM | POA: Diagnosis not present

## 2017-01-30 DIAGNOSIS — I252 Old myocardial infarction: Secondary | ICD-10-CM | POA: Insufficient documentation

## 2017-01-30 DIAGNOSIS — Z8673 Personal history of transient ischemic attack (TIA), and cerebral infarction without residual deficits: Secondary | ICD-10-CM | POA: Diagnosis not present

## 2017-01-30 DIAGNOSIS — E119 Type 2 diabetes mellitus without complications: Secondary | ICD-10-CM | POA: Insufficient documentation

## 2017-01-30 HISTORY — PX: COLONOSCOPY WITH PROPOFOL: SHX5780

## 2017-01-30 LAB — GLUCOSE, CAPILLARY: GLUCOSE-CAPILLARY: 126 mg/dL — AB (ref 65–99)

## 2017-01-30 SURGERY — COLONOSCOPY WITH PROPOFOL
Anesthesia: General

## 2017-01-30 MED ORDER — PROPOFOL 500 MG/50ML IV EMUL
INTRAVENOUS | Status: AC
  Filled 2017-01-30: qty 50

## 2017-01-30 MED ORDER — PROPOFOL 10 MG/ML IV BOLUS
INTRAVENOUS | Status: DC | PRN
  Administered 2017-01-30: 100 mg via INTRAVENOUS

## 2017-01-30 MED ORDER — PHENYLEPHRINE HCL 10 MG/ML IJ SOLN
INTRAMUSCULAR | Status: DC | PRN
  Administered 2017-01-30 (×2): 100 ug via INTRAVENOUS

## 2017-01-30 MED ORDER — SODIUM CHLORIDE 0.9 % IV SOLN
INTRAVENOUS | Status: DC
  Administered 2017-01-30 (×2): via INTRAVENOUS

## 2017-01-30 MED ORDER — PROPOFOL 500 MG/50ML IV EMUL
INTRAVENOUS | Status: DC | PRN
  Administered 2017-01-30: 150 ug/kg/min via INTRAVENOUS

## 2017-01-30 NOTE — Anesthesia Postprocedure Evaluation (Signed)
Anesthesia Post Note  Patient: Vevelyn Pat  Procedure(s) Performed: Procedure(s) (LRB): COLONOSCOPY WITH PROPOFOL (N/A)  Patient location during evaluation: Endoscopy Anesthesia Type: General Level of consciousness: awake and alert and oriented Pain management: pain level controlled Vital Signs Assessment: post-procedure vital signs reviewed and stable Respiratory status: spontaneous breathing, nonlabored ventilation and respiratory function stable Cardiovascular status: blood pressure returned to baseline and stable Postop Assessment: no signs of nausea or vomiting Anesthetic complications: no     Last Vitals:  Vitals:   01/30/17 0909 01/30/17 0919  BP: (!) 104/58 107/65  Pulse: (!) 54   Resp: 18   Temp:      Last Pain:  Vitals:   01/30/17 0801  TempSrc: Tympanic                 Chakira Jachim

## 2017-01-30 NOTE — Op Note (Signed)
[facility] Gastroenterology Patient Name: Lindsey [name] Procedure Date: 01/30/2017 8:33 AM MRN: [id number] Account #: [id number] Date of Birth: 20-May-1961 Admit Type: Outpatient Age: 56 Room: [facility] ENDO ROOM 3 Gender: Female Note Status: Finalized Procedure:            Colonoscopy Indications:          Iron deficiency anemia Providers:            Jonathon Bellows MD, MD Referring MD:         Westley Gambles Medicines:            Monitored Anesthesia Care Complications:        No immediate complications. Procedure:            Pre-Anesthesia Assessment:                       - ASA Grade Assessment: III - A patient with severe                        systemic disease.                       - Prior to the procedure, a History and Physical was                        performed, and patient medications, allergies and                        sensitivities were reviewed. The patient's tolerance of                        previous anesthesia was reviewed.                       - The risks and benefits of the procedure and the                        sedation options and risks were discussed with the                        patient. All questions were answered and informed                        consent was obtained.                       After obtaining informed consent, the colonoscope was                        passed under direct vision. Throughout the procedure,                        the patient's blood pressure, pulse, and oxygen                        saturations were monitored continuously. The was                        introduced through the anus with the intention of                        advancing to the  cecum. The scope was advanced to the                        transverse colon before the procedure was aborted.                        Medications were given. The Olympus CF-HQ190L                        Colonoscope (S#. [id number]) was introduced through the      with the intention of advancing to the cecum. The scope                        was advanced to the transverse colon before the                        procedure was aborted. Medications were given. The                        colonoscopy was performed with ease. The patient                        tolerated the procedure well. The quality of the bowel                        preparation was poor. Findings:      The perianal and digital rectal examinations were normal.      A large amount of semi-solid stool was found in the rectum, in the       sigmoid colon, in the descending colon and in the transverse colon,       precluding visualization. attempted to wash , very large qty and       visualization was still very poor Impression:           - Preparation of the colon was poor.                       - Stool in the rectum, in the sigmoid colon, in the                        descending colon and in the transverse colon.                       - No specimens collected. Recommendation:       - Discharge patient to home (with escort).                       - Resume previous diet.                       - Continue present medications.                       - Repeat colonoscopy in 2 weeks because the bowel                        preparation was poor.                       - Suggest a two day prep Procedure Code(s):    ---  Professional ---                       [phone], 53, Colonoscopy, flexible; diagnostic, including                        collection of specimen(s) by brushing or washing, when                        performed (separate procedure) Diagnosis Code(s):    --- Professional ---                       D50.9, Iron deficiency anemia, unspecified CPT copyright 2016 [employer]. All rights reserved. The codes documented in this report are preliminary and upon coder review may  be revised to meet current compliance requirements. Jonathon Bellows, MD Jonathon Bellows MD, MD 01/30/2017  8:50:18 AM This report has been signed electronically. Number of Addenda: 0 Note Initiated On: 01/30/2017 8:33 AM Total Procedure Duration: 0 hours 7 minutes 22 seconds       [facility]

## 2017-01-30 NOTE — H&P (Signed)
Jonathon Bellows MD [address], [place],  [address] Phone: [phone] Fax : [phone]  Primary Care Physician:  Volanda Napoleon, MD Primary Gastroenterologist:  Dr. Jonathon Bellows   Pre-Procedure History & Physical: HPI:  Lindsey [name] is a 56 y.o. female is here for an colonoscopy.   Past Medical History:  Diagnosis Date  . Allergy   . COPD (chronic obstructive pulmonary disease) ([place])   . Diabetes mellitus without complication ([place])   . GERD (gastroesophageal reflux disease)   . Heart attack   . Hyperlipidemia   . Hypertension   . PUD (peptic ulcer disease)   . Sleep apnea   . Stroke [facility])     Past Surgical History:  Procedure Laterality Date  . ABDOMINAL HYSTERECTOMY    . back surgery    . broken wrist    . ESOPHAGOGASTRODUODENOSCOPY (EGD) WITH PROPOFOL N/A 09/28/2016   Procedure: ESOPHAGOGASTRODUODENOSCOPY (EGD) WITH PROPOFOL;  Surgeon: Manya Silvas, MD;  Location: [facility] ENDOSCOPY;  Service: Endoscopy;  Laterality: N/A;  . GALLBLADDER SURGERY    . GASTRIC BYPASS    . HERNIA REPAIR    . REPLACEMENT TOTAL KNEE    . ROTATOR CUFF REPAIR      Prior to Admission medications   Medication Sig Start Date End Date Taking? Authorizing Provider  albuterol (PROVENTIL HFA;VENTOLIN HFA) 108 (90 BASE) MCG/ACT inhaler Inhale 2 puffs into the lungs every 4 (four) hours as needed for wheezing or shortness of breath.    Historical Provider, MD  albuterol (PROVENTIL) (2.5 MG/3ML) 0.083% nebulizer solution Take 2.5 mg by nebulization every 6 (six) hours as needed for wheezing or shortness of breath.    Historical Provider, MD  ALPRAZolam Duanne Moron) 1 MG tablet Take 1-1.5 mg by mouth 2 (two) times daily. Pt takes one tablet in the morning and one and one-half tablet at night.    Historical Provider, MD  amoxicillin (AMOXIL) 500 MG capsule Take 1 capsule (500 mg total) by mouth 3 (three) times daily. 01/23/17   Earleen Newport, MD  baclofen (LIORESAL) 10 MG tablet  Take 10 mg by mouth 3 (three) times daily.    Historical Provider, MD  Calcium Carbonate-Vitamin D (OYSTER SHELL CALCIUM 500 + D PO)  12/26/16   Historical Provider, MD  Calcium Lactate 648 MG TABS Take 648 mg by mouth 2 (two) times daily.    Historical Provider, MD  cloNIDine (CATAPRES) 0.1 MG tablet Take 0.1 mg by mouth at bedtime.     Historical Provider, MD  desvenlafaxine (PRISTIQ) 100 MG 24 hr tablet Take 100 mg by mouth daily. Pt takes 100 mg tab and 50 mg tab for total of 150 mg    Historical Provider, MD  desvenlafaxine (PRISTIQ) 50 MG 24 hr tablet Take 50 mg by mouth daily. Pt takes 100 mg tab and 50 mg tab for total of 150 mg    Historical Provider, MD  diazepam (VALIUM) 5 MG tablet To be taken 3 times daily with Antivert for vertigo as needed 01/23/17   Earleen Newport, MD  esomeprazole (NEXIUM) 40 MG capsule Take 40 mg by mouth 2 (two) times daily.    Historical Provider, MD  fentaNYL (DURAGESIC - DOSED MCG/HR) 50 MCG/HR Place 50 mcg onto the skin every 3 (three) days.    Historical Provider, MD  ferrous sulfate 325 (65 FE) MG tablet Take 1 tablet (325 mg total) by mouth 2 (two) times daily with a meal. 12/26/16   Hillary Bow, MD  fexofenadine (ALLEGRA) 60 MG tablet Take 60 mg by mouth 2 (two) times daily.    Historical Provider, MD  fluticasone (FLONASE) 50 MCG/ACT nasal spray Place 1 spray into both nostrils 2 (two) times daily.    Historical Provider, MD  folic acid (FOLVITE) 1 MG tablet Take 1 mg by mouth daily.    Historical Provider, MD  hydrOXYzine (ATARAX/VISTARIL) 25 MG tablet Take 25 mg by mouth 3 (three) times daily.    Historical Provider, MD  l-methylfolate-B6-B12 (METANX) 3-35-2 MG TABS tablet Take 1 tablet by mouth 2 (two) times daily.    Historical Provider, MD  meclizine (ANTIVERT) 25 MG tablet Take 1 tablet (25 mg total) by mouth 3 (three) times daily as needed for dizziness or nausea. 01/23/17   Earleen Newport, MD  metoprolol succinate (TOPROL-XL) 100 MG 24 hr  tablet Take 100 mg by mouth daily. Take with or immediately following a meal.    Historical Provider, MD  mirtazapine (REMERON) 45 MG tablet Take 45 mg by mouth at bedtime.    Historical Provider, MD  nitroGLYCERIN (NITROSTAT) 0.3 MG SL tablet Place under the tongue.    Historical Provider, MD  olmesartan-hydrochlorothiazide (BENICAR HCT) 40-12.5 MG tablet Take 1 tablet by mouth daily.    Historical Provider, MD  ONGLYZA 5 MG TABS tablet Take 1 tablet by mouth daily. 08/28/16   Historical Provider, MD  oxybutynin (DITROPAN) 5 MG tablet Take 5 mg by mouth 2 (two) times daily.    Historical Provider, MD  oxyCODONE (OXY IR/ROXICODONE) 5 MG immediate release tablet Take 5 mg by mouth every 6 (six) hours as needed for severe pain.    Historical Provider, MD  potassium chloride (K-DUR) 10 MEQ tablet Take 10 mEq by mouth daily.    Historical Provider, MD  simvastatin (ZOCOR) 40 MG tablet Take 1 tablet by mouth daily. 09/24/16   Historical Provider, MD  sucralfate (CARAFATE) 1 g tablet Take 1 tablet (1 g total) by mouth 4 (four) times daily -  with meals and at bedtime. 09/29/16   Theodoro Grist, MD  Vitamin D, Ergocalciferol, (DRISDOL) 50000 units CAPS capsule Take 1 capsule (50,000 Units total) by mouth every 7 (seven) days. 10/05/16   Theodoro Grist, MD  zolpidem (AMBIEN) 10 MG tablet Take 10 mg by mouth at bedtime as needed for sleep.    Historical Provider, MD    Allergies as of 01/24/2017 - Review Complete 01/24/2017  Allergen Reaction Noted  . Morphine Hives 04/12/2015  . Iodinated diagnostic agents Hives 04/12/2015  . Aspirin Hives 09/02/2015  . Etodolac Hives 04/12/2015  . Ibuprofen Hives 04/12/2015  . Shellfish allergy Hives 04/12/2015  . Tylenol [acetaminophen] Hives 09/27/2016    Family History  Problem Relation Age of Onset  . Hypertension Father   . COPD Father   . Heart disease Father   . Breast cancer Neg Hx     Social History   Social History  . Marital status: Widowed     Spouse name: N/A  . Number of children: N/A  . Years of education: N/A   Occupational History  . disabled    Social History Main Topics  . Smoking status: Never Smoker  . Smokeless tobacco: Never Used  . Alcohol use No  . Drug use: No  . Sexual activity: Yes   Other Topics Concern  . Not on file   Social History Narrative  . No narrative on file    Review of Systems: See HPI, otherwise negative ROS  Physical Exam: BP (!) 131/93   Pulse 89   Temp (!) 96.9 F (36.1 C) (Tympanic)   Resp 20   Ht 5\' 7"  (1.702 m)   Wt 299 lb (135.6 kg)   SpO2 99%   BMI 46.83 kg/m  General:   Alert,  pleasant and cooperative in NAD Head:  Normocephalic and atraumatic. Neck:  Supple; no masses or thyromegaly. Lungs:  Clear throughout to auscultation.    Heart:  Regular rate and rhythm. Abdomen:  Soft, nontender and nondistended. Normal bowel sounds, without guarding, and without rebound.   Neurologic:  Alert and  oriented x4;  grossly normal neurologically.  Impression/Plan: Lindsey [name] is here for an colonoscopy to be performed for iron deficiency anemia   Risks, benefits, limitations, and alternatives regarding  colonoscopy have been reviewed with the patient.  Questions have been answered.  All parties agreeable.   Jonathon Bellows, MD  01/30/2017, 8:04 AM

## 2017-01-30 NOTE — Transfer of Care (Signed)
Immediate Anesthesia Transfer of Care Note  Patient: Lindsey [name]  Procedure(s) Performed: Procedure(s): COLONOSCOPY WITH PROPOFOL (N/A)  Patient Location: PACU  Anesthesia Type:General  Level of Consciousness: sedated  Airway & Oxygen Therapy: Patient Spontanous Breathing and Patient connected to nasal cannula oxygen  Post-op Assessment: Report given to RN and Post -op Vital signs reviewed and stable  Post vital signs: Reviewed and stable  Last Vitals:  Vitals:   01/30/17 0801 01/30/17 0849  BP: (!) 131/93 (!) 81/52  Pulse: 89 71  Resp: 20 17  Temp: (!) 36.1 C     Last Pain:  Vitals:   01/30/17 0801  TempSrc: Tympanic         Complications: No apparent anesthesia complications

## 2017-01-30 NOTE — Anesthesia Preprocedure Evaluation (Signed)
Anesthesia Evaluation  Patient identified by MRN, date of birth, ID band Patient awake    Reviewed: Allergy & Precautions, NPO status , Patient's Chart, lab work & pertinent test results  History of Anesthesia Complications Negative for: history of anesthetic complications  Airway Mallampati: III  TM Distance: >3 FB Neck ROM: Full    Dental no notable dental hx.    Pulmonary sleep apnea , COPD,  COPD inhaler,    breath sounds clear to auscultation- rhonchi (-) wheezing      Cardiovascular hypertension, + Past MI  (-) Cardiac Stents and (-) CABG  Rhythm:Regular Rate:Normal - Systolic murmurs and - Diastolic murmurs    Neuro/Psych  Headaches, Seizures -,  CVA    GI/Hepatic PUD, GERD  ,  Endo/Other  diabetes (diet controlled)Morbid obesity  Renal/GU Renal InsufficiencyRenal disease     Musculoskeletal negative musculoskeletal ROS (+)   Abdominal (+) + obese,   Peds  Hematology  (+) anemia ,   Anesthesia Other Findings Past Medical History: No date: Allergy No date: COPD (chronic obstructive pulmonary disease) (* No date: Diabetes mellitus without complication (HCC) No date: GERD (gastroesophageal reflux disease) No date: Heart attack No date: Hyperlipidemia No date: Hypertension No date: PUD (peptic ulcer disease) No date: Sleep apnea No date: Stroke [facility])   Reproductive/Obstetrics                             Anesthesia Physical Anesthesia Plan  ASA: III  Anesthesia Plan: General   Post-op Pain Management:    Induction: Intravenous  Airway Management Planned: Natural Airway  Additional Equipment:   Intra-op Plan:   Post-operative Plan:   Informed Consent: I have reviewed the patients History and Physical, chart, labs and discussed the procedure including the risks, benefits and alternatives for the proposed anesthesia with the patient or authorized representative who has  indicated his/her understanding and acceptance.   Dental advisory given  Plan Discussed with: CRNA and Anesthesiologist  Anesthesia Plan Comments:         Anesthesia Quick Evaluation

## 2017-01-30 NOTE — Anesthesia Post-op Follow-up Note (Deleted)
Anesthesia QCDR form completed.        

## 2017-01-30 NOTE — Anesthesia Post-op Follow-up Note (Cosign Needed)
Anesthesia QCDR form completed.        

## 2017-01-31 ENCOUNTER — Encounter: Payer: Self-pay | Admitting: Gastroenterology

## 2017-02-06 ENCOUNTER — Other Ambulatory Visit: Payer: Self-pay

## 2017-02-06 DIAGNOSIS — Z1211 Encounter for screening for malignant neoplasm of colon: Secondary | ICD-10-CM

## 2017-02-06 MED ORDER — SUPREP BOWEL PREP KIT 17.5-3.13-1.6 GM/177ML PO SOLN: 1.0000 | Freq: Once | ORAL | 0 refills | Status: AC

## 2017-02-06 NOTE — Telephone Encounter (Signed)
Rescheduled colonoscopy  

## 2017-02-20 ENCOUNTER — Encounter
Admission: RE | Disposition: A | Discharge status: Home / Self Care | Payer: Self-pay | Source: Ambulatory Visit | Attending: Gastroenterology

## 2017-02-20 ENCOUNTER — Ambulatory Visit: Payer: Medicare Other | Admitting: Anesthesiology

## 2017-02-20 ENCOUNTER — Encounter: Payer: Self-pay | Admitting: *Deleted

## 2017-02-20 ENCOUNTER — Ambulatory Visit
Admission: RE | Admit: 2017-02-20 | Discharge: 2017-02-20 | Disposition: A | Discharge status: Home / Self Care | Payer: Medicare Other | Source: Ambulatory Visit | Attending: Gastroenterology | Admitting: Gastroenterology

## 2017-02-20 DIAGNOSIS — R51 Headache: Secondary | ICD-10-CM | POA: Insufficient documentation

## 2017-02-20 DIAGNOSIS — E119 Type 2 diabetes mellitus without complications: Secondary | ICD-10-CM | POA: Insufficient documentation

## 2017-02-20 DIAGNOSIS — I252 Old myocardial infarction: Secondary | ICD-10-CM | POA: Insufficient documentation

## 2017-02-20 DIAGNOSIS — Z79899 Other long term (current) drug therapy: Secondary | ICD-10-CM | POA: Diagnosis not present

## 2017-02-20 DIAGNOSIS — Z8711 Personal history of peptic ulcer disease: Secondary | ICD-10-CM | POA: Diagnosis not present

## 2017-02-20 DIAGNOSIS — G4730 Sleep apnea, unspecified: Secondary | ICD-10-CM | POA: Insufficient documentation

## 2017-02-20 DIAGNOSIS — Z8673 Personal history of transient ischemic attack (TIA), and cerebral infarction without residual deficits: Secondary | ICD-10-CM | POA: Diagnosis not present

## 2017-02-20 DIAGNOSIS — K219 Gastro-esophageal reflux disease without esophagitis: Secondary | ICD-10-CM | POA: Diagnosis not present

## 2017-02-20 DIAGNOSIS — D509 Iron deficiency anemia, unspecified: Secondary | ICD-10-CM

## 2017-02-20 DIAGNOSIS — E669 Obesity, unspecified: Secondary | ICD-10-CM | POA: Insufficient documentation

## 2017-02-20 DIAGNOSIS — Z1211 Encounter for screening for malignant neoplasm of colon: Secondary | ICD-10-CM

## 2017-02-20 DIAGNOSIS — I10 Essential (primary) hypertension: Secondary | ICD-10-CM | POA: Diagnosis not present

## 2017-02-20 DIAGNOSIS — J449 Chronic obstructive pulmonary disease, unspecified: Secondary | ICD-10-CM | POA: Diagnosis not present

## 2017-02-20 DIAGNOSIS — Z6841 Body Mass Index (BMI) 40.0 and over, adult: Secondary | ICD-10-CM | POA: Insufficient documentation

## 2017-02-20 DIAGNOSIS — E785 Hyperlipidemia, unspecified: Secondary | ICD-10-CM | POA: Insufficient documentation

## 2017-02-20 DIAGNOSIS — Z955 Presence of coronary angioplasty implant and graft: Secondary | ICD-10-CM | POA: Insufficient documentation

## 2017-02-20 HISTORY — PX: COLONOSCOPY WITH PROPOFOL: SHX5780

## 2017-02-20 LAB — GLUCOSE, CAPILLARY: Glucose-Capillary: 119 mg/dL — ABNORMAL HIGH (ref 65–99)

## 2017-02-20 SURGERY — COLONOSCOPY WITH PROPOFOL
Anesthesia: General

## 2017-02-20 MED ORDER — LIDOCAINE HCL (PF) 2 % IJ SOLN
INTRAMUSCULAR | Status: AC
  Filled 2017-02-20: qty 2

## 2017-02-20 MED ORDER — PROPOFOL 500 MG/50ML IV EMUL
INTRAVENOUS | Status: DC | PRN
Start: 2017-02-20 — End: 2017-02-20
  Administered 2017-02-20: 200 ug/kg/min via INTRAVENOUS

## 2017-02-20 MED ORDER — PROPOFOL 10 MG/ML IV BOLUS
INTRAVENOUS | Status: DC | PRN
  Administered 2017-02-20: 50 mg via INTRAVENOUS

## 2017-02-20 MED ORDER — PROPOFOL 500 MG/50ML IV EMUL
INTRAVENOUS | Status: AC
  Filled 2017-02-20: qty 50

## 2017-02-20 MED ORDER — SODIUM CHLORIDE 0.9 % IV SOLN
INTRAVENOUS | Status: DC
  Administered 2017-02-20: 10:00:00 via INTRAVENOUS

## 2017-02-20 MED ORDER — LIDOCAINE 2% (20 MG/ML) 5 ML SYRINGE
INTRAMUSCULAR | Status: DC | PRN
  Administered 2017-02-20: 50 mg via INTRAVENOUS

## 2017-02-20 MED ORDER — GLYCOPYRROLATE 0.2 MG/ML IJ SOLN
INTRAMUSCULAR | Status: DC | PRN
  Administered 2017-02-20: 0.2 mg via INTRAVENOUS

## 2017-02-20 NOTE — Anesthesia Postprocedure Evaluation (Signed)
Anesthesia Post Note  Patient: Lindsey [name]  Procedure(s) Performed: Procedure(s) (LRB): Colonoscopy with propofol  (N/A)  Patient location during evaluation: Endoscopy Anesthesia Type: General Level of consciousness: awake and alert Pain management: pain level controlled Vital Signs Assessment: post-procedure vital signs reviewed and stable Respiratory status: spontaneous breathing, nonlabored ventilation, respiratory function stable and patient connected to nasal cannula oxygen Cardiovascular status: blood pressure returned to baseline and stable Postop Assessment: no signs of nausea or vomiting Anesthetic complications: no     Last Vitals:  Vitals:   02/20/17 1119 02/20/17 1129  BP: 102/73 114/67  Pulse:    Resp:    Temp:      Last Pain:  Vitals:   02/20/17 1114  TempSrc: Tympanic  PainSc:                  Martha Clan

## 2017-02-20 NOTE — H&P (Signed)
Jonathon Bellows MD [address] [place], [place] [address] Phone: [phone] Fax : [phone]  Primary Care Physician:  Volanda Napoleon, MD Primary Gastroenterologist:  Dr. Jonathon Bellows   Pre-Procedure History & Physical: HPI:  Lindsey [name] is a 56 y.o. female is here for an colonoscopy.   Past Medical History:  Diagnosis Date  . Allergy   . COPD (chronic obstructive pulmonary disease) ([place])   . Diabetes mellitus without complication ([place])   . GERD (gastroesophageal reflux disease)   . Heart attack   . Hyperlipidemia   . Hypertension   . PUD (peptic ulcer disease)   . Sleep apnea   . Stroke [facility])     Past Surgical History:  Procedure Laterality Date  . ABDOMINAL HYSTERECTOMY    . back surgery    . broken wrist    . COLONOSCOPY WITH PROPOFOL N/A 01/30/2017   Procedure: COLONOSCOPY WITH PROPOFOL;  Surgeon: Jonathon Bellows, MD;  Location: [facility] ENDOSCOPY;  Service: Endoscopy;  Laterality: N/A;  . ESOPHAGOGASTRODUODENOSCOPY (EGD) WITH PROPOFOL N/A 09/28/2016   Procedure: ESOPHAGOGASTRODUODENOSCOPY (EGD) WITH PROPOFOL;  Surgeon: Manya Silvas, MD;  Location: [facility] ENDOSCOPY;  Service: Endoscopy;  Laterality: N/A;  . GALLBLADDER SURGERY    . GASTRIC BYPASS    . HERNIA REPAIR    . REPLACEMENT TOTAL KNEE    . ROTATOR CUFF REPAIR      Prior to Admission medications   Medication Sig Start Date End Date Taking? Authorizing Provider  albuterol (PROVENTIL HFA;VENTOLIN HFA) 108 (90 BASE) MCG/ACT inhaler Inhale 2 puffs into the lungs every 4 (four) hours as needed for wheezing or shortness of breath.   Yes Historical Provider, MD  ALPRAZolam Duanne Moron) 1 MG tablet Take 1-1.5 mg by mouth 2 (two) times daily. Pt takes one tablet in the morning and one and one-half tablet at night.   Yes Historical Provider, MD  baclofen (LIORESAL) 10 MG tablet Take 10 mg by mouth 3 (three) times daily.   Yes Historical Provider, MD  Calcium Carbonate-Vitamin D (OYSTER SHELL CALCIUM 500 + D PO)   12/26/16  Yes Historical Provider, MD  Calcium Lactate 648 MG TABS Take 648 mg by mouth 2 (two) times daily.   Yes Historical Provider, MD  cloNIDine (CATAPRES) 0.1 MG tablet Take 0.1 mg by mouth at bedtime.    Yes Historical Provider, MD  desvenlafaxine (PRISTIQ) 100 MG 24 hr tablet Take 100 mg by mouth daily. Pt takes 100 mg tab and 50 mg tab for total of 150 mg   Yes Historical Provider, MD  Desvenlafaxine ER (PRISTIQ) 50 MG TB24  01/23/17  Yes Historical Provider, MD  esomeprazole (NEXIUM) 40 MG capsule Take 40 mg by mouth 2 (two) times daily.   Yes Historical Provider, MD  fentaNYL (DURAGESIC - DOSED MCG/HR) 50 MCG/HR Place 50 mcg onto the skin every 3 (three) days.   Yes Historical Provider, MD  ferrous sulfate 325 (65 FE) MG tablet Take 1 tablet (325 mg total) by mouth 2 (two) times daily with a meal. 12/26/16  Yes Srikar Sudini, MD  fexofenadine (ALLEGRA) 180 MG tablet  01/25/17  Yes Historical Provider, MD  fluticasone (FLONASE) 50 MCG/ACT nasal spray Place 1 spray into both nostrils 2 (two) times daily.   Yes Historical Provider, MD  folic acid (FOLVITE) 1 MG tablet Take 1 mg by mouth daily.   Yes Historical Provider, MD  hydrOXYzine (ATARAX/VISTARIL) 25 MG tablet Take 25 mg by mouth 3 (three) times daily.   Yes Historical  Provider, MD  meclizine (ANTIVERT) 25 MG tablet Take 1 tablet (25 mg total) by mouth 3 (three) times daily as needed for dizziness or nausea. 01/23/17  Yes Earleen Newport, MD  metoprolol succinate (TOPROL-XL) 100 MG 24 hr tablet Take 100 mg by mouth daily. Take with or immediately following a meal.   Yes Historical Provider, MD  mirtazapine (REMERON) 45 MG tablet Take 45 mg by mouth at bedtime.   Yes Historical Provider, MD  olmesartan-hydrochlorothiazide (BENICAR HCT) 40-12.5 MG tablet Take 1 tablet by mouth daily.   Yes Historical Provider, MD  ONGLYZA 5 MG TABS tablet Take 1 tablet by mouth daily. 08/28/16  Yes Historical Provider, MD  oxybutynin (DITROPAN) 5 MG tablet  Take 5 mg by mouth 2 (two) times daily.   Yes Historical Provider, MD  oxyCODONE (OXY IR/ROXICODONE) 5 MG immediate release tablet Take 5 mg by mouth every 6 (six) hours as needed for severe pain.   Yes Historical Provider, MD  potassium chloride (K-DUR) 10 MEQ tablet Take 10 mEq by mouth daily.   Yes Historical Provider, MD  simvastatin (ZOCOR) 40 MG tablet Take 1 tablet by mouth daily. 09/24/16  Yes Historical Provider, MD  sucralfate (CARAFATE) 1 g tablet Take 1 tablet (1 g total) by mouth 4 (four) times daily -  with meals and at bedtime. 09/29/16  Yes Theodoro Grist, MD  Vitamin D, Ergocalciferol, (DRISDOL) 50000 units CAPS capsule Take 1 capsule (50,000 Units total) by mouth every 7 (seven) days. 10/05/16  Yes Theodoro Grist, MD  zolpidem (AMBIEN) 10 MG tablet Take 10 mg by mouth at bedtime as needed for sleep.   Yes Historical Provider, MD  albuterol (PROVENTIL) (2.5 MG/3ML) 0.083% nebulizer solution Take 2.5 mg by nebulization every 6 (six) hours as needed for wheezing or shortness of breath.    Historical Provider, MD  amoxicillin (AMOXIL) 500 MG capsule Take 1 capsule (500 mg total) by mouth 3 (three) times daily. Patient not taking: Reported on 02/20/2017 01/23/17   Earleen Newport, MD  amoxicillin-clavulanate (AUGMENTIN) 875-125 MG tablet take 1 tablet by mouth twice a day for 7 days 11/29/16   Historical Provider, MD  desvenlafaxine (PRISTIQ) 50 MG 24 hr tablet Take 50 mg by mouth daily. Pt takes 100 mg tab and 50 mg tab for total of 150 mg    Historical Provider, MD  Desvenlafaxine ER 100 MG TB24  01/23/17   Historical Provider, MD  diazepam (VALIUM) 5 MG tablet To be taken 3 times daily with Antivert for vertigo as needed Patient not taking: Reported on 02/20/2017 01/23/17   Earleen Newport, MD  fexofenadine (ALLEGRA) 60 MG tablet Take 60 mg by mouth 2 (two) times daily.    Historical Provider, MD  l-methylfolate-B6-B12 (METANX) 3-35-2 MG TABS tablet Take 1 tablet by mouth 2 (two) times  daily.    Historical Provider, MD  nitroGLYCERIN (NITROSTAT) 0.3 MG SL tablet Place under the tongue.    Historical Provider, MD    Allergies as of 02/06/2017 - Review Complete 01/30/2017  Allergen Reaction Noted  . Morphine Hives 04/12/2015  . Iodinated diagnostic agents Hives 04/12/2015  . Aspirin Hives 09/02/2015  . Etodolac Hives 04/12/2015  . Ibuprofen Hives 04/12/2015  . Shellfish allergy Hives 04/12/2015  . Tylenol [acetaminophen] Hives 09/27/2016    Family History  Problem Relation Age of Onset  . Hypertension Father   . COPD Father   . Heart disease Father   . Breast cancer Neg Hx  Social History   Social History  . Marital status: Widowed    Spouse name: N/A  . Number of children: N/A  . Years of education: N/A   Occupational History  . disabled    Social History Main Topics  . Smoking status: Never Smoker  . Smokeless tobacco: Never Used  . Alcohol use No  . Drug use: No  . Sexual activity: Yes   Other Topics Concern  . Not on file   Social History Narrative  . No narrative on file    Review of Systems: See HPI, otherwise negative ROS  Physical Exam: BP 120/80   Pulse 72   Temp (!) 96 F (35.6 C) (Tympanic)   Resp 18   Wt 298 lb (135.2 kg)   SpO2 100%   BMI 46.67 kg/m  General:   Alert,  pleasant and cooperative in NAD Head:  Normocephalic and atraumatic. Neck:  Supple; no masses or thyromegaly. Lungs:  Clear throughout to auscultation.    Heart:  Regular rate and rhythm. Abdomen:  Soft, nontender and nondistended. Normal bowel sounds, without guarding, and without rebound.   Neurologic:  Alert and  oriented x4;  grossly normal neurologically.  Impression/Plan: Lindsey [name] is here for an colonoscopy to be performed for iron deficiency anemia   Risks, benefits, limitations, and alternatives regarding  colonoscopy have been reviewed with the patient.  Questions have been answered.  All parties agreeable.   Jonathon Bellows, MD   02/20/2017, 9:48 AM

## 2017-02-20 NOTE — Anesthesia Post-op Follow-up Note (Cosign Needed)
Anesthesia QCDR form completed.        

## 2017-02-20 NOTE — Transfer of Care (Signed)
Immediate Anesthesia Transfer of Care Note  Patient: Lindsey [name]  Procedure(s) Performed: Procedure(s): Colonoscopy with propofol  (N/A)  Patient Location: Endoscopy Unit  Anesthesia Type:General  Level of Consciousness: sedated  Airway & Oxygen Therapy: Patient connected to nasal cannula oxygen  Post-op Assessment: Post -op Vital signs reviewed and stable  Post vital signs: stable  Last Vitals:  Vitals:   02/20/17 0929 02/20/17 1114  BP: 120/80 (!) 87/61  Pulse: 72 67  Resp: 18 12  Temp: (!) 35.6 C (!) 35.9 C    Last Pain:  Vitals:   02/20/17 1114  TempSrc: Tympanic  PainSc:          Complications: No apparent anesthesia complications

## 2017-02-20 NOTE — Op Note (Signed)
[facility] Gastroenterology Patient Name: Lindsey [name] Procedure Date: 02/20/2017 10:46 AM MRN: [id number] Account #: [id number] Date of Birth: 17-Jul-1961 Admit Type: Outpatient Age: 56 Room: [facility] ENDO ROOM 1 Gender: Female Note Status: Finalized Procedure:            Colonoscopy Indications:          Iron deficiency anemia Providers:            Jonathon Bellows MD, MD Referring MD:         Venetia Maxon. Elijio Miles, MD (Referring MD) Medicines:            Monitored Anesthesia Care Complications:        No immediate complications. Procedure:            Pre-Anesthesia Assessment:                       - Prior to the procedure, a History and Physical was                        performed, and patient medications, allergies and                        sensitivities were reviewed. The patient's tolerance of                        previous anesthesia was reviewed.                       - The risks and benefits of the procedure and the                        sedation options and risks were discussed with the                        patient. All questions were answered and informed                        consent was obtained.                       - ASA Grade Assessment: III - A patient with severe                        systemic disease.                       After obtaining informed consent, the colonoscope was                        passed under direct vision. Throughout the procedure,                        the patient's blood pressure, pulse, and oxygen                        saturations were monitored continuously. The                        Colonoscope was introduced through the anus and  advanced to the the cecum, identified by the                        appendiceal orifice, IC valve and transillumination.                        The colonoscopy was performed with ease. The patient                        tolerated the procedure well. The quality of  the bowel                        preparation was poor. Findings:      The exam was otherwise without abnormality on direct and retroflexion       views. Impression:           - Preparation of the colon was poor.                       - The examination was otherwise normal on direct and                        retroflexion views.                       - No specimens collected. Recommendation:       - Discharge patient to home (with escort).                       - Resume previous diet.                       - Continue present medications.                       - Repeat colonoscopy in 3 months.                       - To visualize the small bowel, perform video capsule                        endoscopy in 2 weeks.                       - Return to my office in 4 weeks. Procedure Code(s):    --- Professional ---                       [phone], Colonoscopy, flexible; diagnostic, including                        collection of specimen(s) by brushing or washing, when                        performed (separate procedure) Diagnosis Code(s):    --- Professional ---                       D50.9, Iron deficiency anemia, unspecified CPT copyright 2016 [employer]. All rights reserved. The codes documented in this report are preliminary and upon coder review may  be revised to meet current compliance requirements. Jonathon Bellows, MD Jonathon Bellows MD, MD 02/20/2017 11:08:20 AM This  report has been signed electronically. Number of Addenda: 0 Note Initiated On: 02/20/2017 10:46 AM Scope Withdrawal Time: 0 hours 6 minutes 47 seconds  Total Procedure Duration: 0 hours 14 minutes 18 seconds       [facility]

## 2017-02-20 NOTE — Anesthesia Preprocedure Evaluation (Signed)
Anesthesia Evaluation  Patient identified by MRN, date of birth, ID band Patient awake    Reviewed: Allergy & Precautions, NPO status , Patient's Chart, lab work & pertinent test results  History of Anesthesia Complications Negative for: history of anesthetic complications  Airway Mallampati: III  TM Distance: >3 FB Neck ROM: Full    Dental no notable dental hx.    Pulmonary sleep apnea , COPD,  COPD inhaler,    breath sounds clear to auscultation- rhonchi (-) wheezing      Cardiovascular hypertension, + Past MI  (-) Cardiac Stents and (-) CABG  Rhythm:Regular Rate:Normal - Systolic murmurs and - Diastolic murmurs    Neuro/Psych  Headaches, Seizures -,  CVA    GI/Hepatic PUD, GERD  ,  Endo/Other  diabetesMorbid obesity  Renal/GU Renal InsufficiencyRenal disease     Musculoskeletal negative musculoskeletal ROS (+)   Abdominal (+) + obese,   Peds  Hematology  (+) anemia ,   Anesthesia Other Findings Past Medical History: No date: Allergy No date: COPD (chronic obstructive pulmonary disease) (* No date: Diabetes mellitus without complication (HCC) No date: GERD (gastroesophageal reflux disease) No date: Heart attack No date: Hyperlipidemia No date: Hypertension No date: PUD (peptic ulcer disease) No date: Sleep apnea No date: Stroke [facility])   Reproductive/Obstetrics                             Anesthesia Physical  Anesthesia Plan  ASA: III  Anesthesia Plan: General   Post-op Pain Management:    Induction: Intravenous  Airway Management Planned: Natural Airway  Additional Equipment:   Intra-op Plan:   Post-operative Plan:   Informed Consent: I have reviewed the patients History and Physical, chart, labs and discussed the procedure including the risks, benefits and alternatives for the proposed anesthesia with the patient or authorized representative who has indicated  his/her understanding and acceptance.   Dental advisory given  Plan Discussed with: CRNA and Anesthesiologist  Anesthesia Plan Comments:         Anesthesia Quick Evaluation

## 2017-02-22 ENCOUNTER — Encounter: Payer: Self-pay | Admitting: Gastroenterology

## 2017-03-05 ENCOUNTER — Telehealth: Payer: Self-pay | Admitting: Gastroenterology

## 2017-03-05 ENCOUNTER — Telehealth: Payer: Self-pay

## 2017-03-05 ENCOUNTER — Other Ambulatory Visit: Payer: Self-pay

## 2017-03-05 DIAGNOSIS — D509 Iron deficiency anemia, unspecified: Secondary | ICD-10-CM

## 2017-03-05 NOTE — Telephone Encounter (Signed)
Left voicemail with instructions for capsule endoscopy.  Scheduled for Tuesday, 03/13/17

## 2017-03-05 NOTE — Telephone Encounter (Signed)
Patient called and feels like she needs an egd with her colonoscopy. She feels like her ulcer is coming back. Her stomach hurts really bad after she eats. The same place she has had them before.

## 2017-03-09 ENCOUNTER — Other Ambulatory Visit: Payer: Self-pay

## 2017-03-09 ENCOUNTER — Telehealth: Payer: Self-pay | Admitting: Gastroenterology

## 2017-03-09 DIAGNOSIS — R1310 Dysphagia, unspecified: Secondary | ICD-10-CM

## 2017-03-09 DIAGNOSIS — R1319 Other dysphagia: Secondary | ICD-10-CM

## 2017-03-09 DIAGNOSIS — D509 Iron deficiency anemia, unspecified: Secondary | ICD-10-CM

## 2017-03-09 MED ORDER — PEG 3350-KCL-NA BICARB-NACL 420 G PO SOLR
4000.0000 mL | Freq: Once | ORAL | 0 refills | Status: AC
Start: 2017-03-09 — End: 2017-03-09

## 2017-03-09 NOTE — Telephone Encounter (Signed)
Needs Rx called in for the procedure coming up on Tuesday to [facility] [address]

## 2017-03-09 NOTE — Telephone Encounter (Signed)
03/09/17 Spoke with Anda Kraft at [facility] and NO prior Josem Kaufmann is required for Capsule Study 91110 / R13.10

## 2017-03-13 ENCOUNTER — Ambulatory Visit: Payer: Medicare Other | Admitting: Certified Registered Nurse Anesthetist

## 2017-03-13 ENCOUNTER — Encounter
Admission: RE | Disposition: A | Discharge status: Home / Self Care | Payer: Self-pay | Source: Ambulatory Visit | Attending: Gastroenterology

## 2017-03-13 ENCOUNTER — Ambulatory Visit
Admission: RE | Admit: 2017-03-13 | Discharge: 2017-03-13 | Disposition: A | Discharge status: Home / Self Care | Payer: Medicare Other | Source: Ambulatory Visit | Attending: Gastroenterology | Admitting: Gastroenterology

## 2017-03-13 ENCOUNTER — Encounter: Payer: Self-pay | Admitting: *Deleted

## 2017-03-13 DIAGNOSIS — R1310 Dysphagia, unspecified: Secondary | ICD-10-CM | POA: Diagnosis not present

## 2017-03-13 DIAGNOSIS — I10 Essential (primary) hypertension: Secondary | ICD-10-CM | POA: Insufficient documentation

## 2017-03-13 DIAGNOSIS — E785 Hyperlipidemia, unspecified: Secondary | ICD-10-CM | POA: Diagnosis not present

## 2017-03-13 DIAGNOSIS — I252 Old myocardial infarction: Secondary | ICD-10-CM | POA: Diagnosis not present

## 2017-03-13 DIAGNOSIS — K219 Gastro-esophageal reflux disease without esophagitis: Secondary | ICD-10-CM | POA: Diagnosis not present

## 2017-03-13 DIAGNOSIS — E119 Type 2 diabetes mellitus without complications: Secondary | ICD-10-CM | POA: Diagnosis not present

## 2017-03-13 DIAGNOSIS — J449 Chronic obstructive pulmonary disease, unspecified: Secondary | ICD-10-CM | POA: Insufficient documentation

## 2017-03-13 DIAGNOSIS — D509 Iron deficiency anemia, unspecified: Secondary | ICD-10-CM

## 2017-03-13 DIAGNOSIS — Z8673 Personal history of transient ischemic attack (TIA), and cerebral infarction without residual deficits: Secondary | ICD-10-CM | POA: Insufficient documentation

## 2017-03-13 DIAGNOSIS — Z96659 Presence of unspecified artificial knee joint: Secondary | ICD-10-CM | POA: Diagnosis not present

## 2017-03-13 DIAGNOSIS — Z9884 Bariatric surgery status: Secondary | ICD-10-CM | POA: Diagnosis not present

## 2017-03-13 DIAGNOSIS — Z79899 Other long term (current) drug therapy: Secondary | ICD-10-CM | POA: Diagnosis not present

## 2017-03-13 DIAGNOSIS — Z7951 Long term (current) use of inhaled steroids: Secondary | ICD-10-CM | POA: Diagnosis not present

## 2017-03-13 DIAGNOSIS — R109 Unspecified abdominal pain: Secondary | ICD-10-CM | POA: Insufficient documentation

## 2017-03-13 DIAGNOSIS — G4730 Sleep apnea, unspecified: Secondary | ICD-10-CM | POA: Diagnosis not present

## 2017-03-13 HISTORY — PX: GIVENS CAPSULE STUDY: SHX5432

## 2017-03-13 HISTORY — PX: ESOPHAGOGASTRODUODENOSCOPY (EGD) WITH PROPOFOL: SHX5813

## 2017-03-13 HISTORY — PX: COLONOSCOPY WITH PROPOFOL: SHX5780

## 2017-03-13 LAB — GLUCOSE, CAPILLARY: GLUCOSE-CAPILLARY: 87 mg/dL (ref 65–99)

## 2017-03-13 SURGERY — IMAGING PROCEDURE, GI TRACT, INTRALUMINAL, VIA CAPSULE
Anesthesia: General

## 2017-03-13 SURGERY — COLONOSCOPY WITH PROPOFOL
Anesthesia: General

## 2017-03-13 MED ORDER — PROPOFOL 10 MG/ML IV BOLUS
INTRAVENOUS | Status: DC | PRN
  Administered 2017-03-13: 10 mg via INTRAVENOUS
  Administered 2017-03-13: 80 mg via INTRAVENOUS
  Administered 2017-03-13: 10 mg via INTRAVENOUS

## 2017-03-13 MED ORDER — LIDOCAINE HCL (PF) 1 % IJ SOLN
2.0000 mL | Freq: Once | INTRAMUSCULAR | Status: AC
  Administered 2017-03-13: 0.3 mL via INTRADERMAL
  Filled 2017-03-13: qty 2

## 2017-03-13 MED ORDER — LIDOCAINE HCL (CARDIAC) 20 MG/ML IV SOLN
INTRAVENOUS | Status: DC | PRN
  Administered 2017-03-13: 100 mg via INTRAVENOUS

## 2017-03-13 MED ORDER — PROPOFOL 500 MG/50ML IV EMUL
INTRAVENOUS | Status: AC
  Filled 2017-03-13: qty 50

## 2017-03-13 MED ORDER — SODIUM CHLORIDE 0.9 % IV SOLN
INTRAVENOUS | Status: DC
  Administered 2017-03-13: 1000 mL via INTRAVENOUS

## 2017-03-13 MED ORDER — PROPOFOL 10 MG/ML IV BOLUS
INTRAVENOUS | Status: AC
  Filled 2017-03-13: qty 20

## 2017-03-13 MED ORDER — PROPOFOL 500 MG/50ML IV EMUL
INTRAVENOUS | Status: DC | PRN
  Administered 2017-03-13: 160 ug/kg/min via INTRAVENOUS

## 2017-03-13 MED ORDER — LIDOCAINE HCL (PF) 2 % IJ SOLN
INTRAMUSCULAR | Status: AC
  Filled 2017-03-13: qty 2

## 2017-03-13 MED ORDER — GLYCOPYRROLATE 0.2 MG/ML IJ SOLN
INTRAMUSCULAR | Status: DC | PRN
  Administered 2017-03-13: 0.2 mg via INTRAVENOUS

## 2017-03-13 NOTE — Anesthesia Procedure Notes (Signed)
Performed by: Jamario Colina Pre-anesthesia Checklist: Patient identified, Emergency Drugs available, Suction available, Patient being monitored and Timeout performed Patient Re-evaluated:Patient Re-evaluated prior to inductionOxygen Delivery Method: Nasal cannula Intubation Type: IV induction       

## 2017-03-13 NOTE — Transfer of Care (Signed)
Immediate Anesthesia Transfer of Care Note  Patient: Vevelyn Pat  Procedure(s) Performed: Procedure(s): GIVENS CAPSULE STUDY (N/A) COLONOSCOPY WITH PROPOFOL (N/A) ESOPHAGOGASTRODUODENOSCOPY (EGD) WITH PROPOFOL (N/A)  Patient Location: PACU  Anesthesia Type:General  Level of Consciousness: awake and alert   Airway & Oxygen Therapy: Patient Spontanous Breathing and Patient connected to nasal cannula oxygen  Post-op Assessment: Report given to RN and Post -op Vital signs reviewed and stable  Post vital signs: Reviewed and stable  Last Vitals:  Vitals:   03/13/17 0728  BP: 122/82  Pulse: 83  Resp: 16  Temp: (!) 35.6 C    Last Pain:  Vitals:   03/13/17 0728  TempSrc: Tympanic         Complications: No apparent anesthesia complications

## 2017-03-13 NOTE — Op Note (Signed)
[facility] Gastroenterology Patient Name: Lindsey [name] Procedure Date: 03/13/2017 8:28 AM MRN: [id number] Account #: [id number] Date of Birth: 07/14/1961 Admit Type: Outpatient Age: 56 Room: [facility] ENDO ROOM 3 Gender: Female Note Status: Finalized Procedure:            Colonoscopy Indications:          Iron deficiency anemia Providers:            Jonathon Bellows MD, MD Referring MD:         Venetia Maxon. Elijio Miles, MD (Referring MD) Medicines:            Monitored Anesthesia Care Complications:        No immediate complications. Procedure:            Pre-Anesthesia Assessment:                       - Prior to the procedure, a History and Physical was                        performed, and patient medications, allergies and                        sensitivities were reviewed. The patient's tolerance of                        previous anesthesia was reviewed.                       - The risks and benefits of the procedure and the                        sedation options and risks were discussed with the                        patient. All questions were answered and informed                        consent was obtained.                       - ASA Grade Assessment: IV - A patient with severe                        systemic disease that is a constant threat to life.                       After obtaining informed consent, the colonoscope was                        passed under direct vision. Throughout the procedure,                        the patient's blood pressure, pulse, and oxygen                        saturations were monitored continuously. The                        Colonoscope was introduced through the anus and  advanced to the the ascending colon. The colonoscopy                        was performed with ease. The patient tolerated the                        procedure well. The quality of the bowel preparation                        was  poor. Findings:      The perianal and digital rectal examinations were normal.      A large amount of semi-liquid semi-solid stool was found in the entire       colon, precluding visualization. Impression:           - Preparation of the colon was poor.                       - Stool in the entire examined colon.                       - No specimens collected. Recommendation:       - Discharge patient to home.                       - Resume previous diet.                       - Continue present medications.                       - Return to my office in 2 weeks.                       - This is the third attempt at colon prep and she has                        had a poor prep with solid stool in the colon. Capsule                        study will be cancelled and she will need to see me in                        the office to discuss her options. Procedure Code(s):    --- Professional ---                       [phone], 79, Colonoscopy, flexible; diagnostic, including                        collection of specimen(s) by brushing or washing, when                        performed (separate procedure) Diagnosis Code(s):    --- Professional ---                       D50.9, Iron deficiency anemia, unspecified CPT copyright 2016 American Medical Association. All rights reserved. The codes documented in this report are preliminary and upon coder review may  be revised to meet current compliance requirements. Jonathon Bellows, MD Jonathon Bellows MD, MD  03/13/2017 8:56:55 AM This report has been signed electronically. Number of Addenda: 0 Note Initiated On: 03/13/2017 8:28 AM Total Procedure Duration: 0 hours 9 minutes 59 seconds       [facility]

## 2017-03-13 NOTE — H&P (Signed)
Jonathon Bellows MD [address] [place], [place] [address] Phone: [phone] Fax : [phone]  Primary Care Physician:  Volanda Napoleon, MD Primary Gastroenterologist:  Dr. Jonathon Bellows   Pre-Procedure History & Physical: HPI:  Lindsey [name] is a 56 y.o. female is here for an endoscopy, colonoscopy , possible dilation and capsule endoscopy to evaluate abdominal pain , dysphagia and iron deficiency anemia .   Past Medical History:  Diagnosis Date  . Allergy   . COPD (chronic obstructive pulmonary disease) ([place])   . Diabetes mellitus without complication ([place])   . GERD (gastroesophageal reflux disease)   . Heart attack ([place])   . Hyperlipidemia   . Hypertension   . PUD (peptic ulcer disease)   . Sleep apnea   . Stroke [facility])     Past Surgical History:  Procedure Laterality Date  . ABDOMINAL HYSTERECTOMY    . back surgery    . broken wrist    . COLONOSCOPY WITH PROPOFOL N/A 01/30/2017   Procedure: COLONOSCOPY WITH PROPOFOL;  Surgeon: Jonathon Bellows, MD;  Location: [facility] ENDOSCOPY;  Service: Endoscopy;  Laterality: N/A;  . COLONOSCOPY WITH PROPOFOL N/A 02/20/2017   Procedure: Colonoscopy with propofol ;  Surgeon: Jonathon Bellows, MD;  Location: [facility] ENDOSCOPY;  Service: Endoscopy;  Laterality: N/A;  . ESOPHAGOGASTRODUODENOSCOPY (EGD) WITH PROPOFOL N/A 09/28/2016   Procedure: ESOPHAGOGASTRODUODENOSCOPY (EGD) WITH PROPOFOL;  Surgeon: Manya Silvas, MD;  Location: [facility] ENDOSCOPY;  Service: Endoscopy;  Laterality: N/A;  . GALLBLADDER SURGERY    . GASTRIC BYPASS    . HERNIA REPAIR    . JOINT REPLACEMENT  1995  . REPLACEMENT TOTAL KNEE    . ROTATOR CUFF REPAIR      Prior to Admission medications   Medication Sig Start Date End Date Taking? Authorizing Provider  albuterol (PROVENTIL HFA;VENTOLIN HFA) 108 (90 BASE) MCG/ACT inhaler Inhale 2 puffs into the lungs every 4 (four) hours as needed for wheezing or shortness of breath.    Historical Provider, MD  albuterol  (PROVENTIL) (2.5 MG/3ML) 0.083% nebulizer solution Take 2.5 mg by nebulization every 6 (six) hours as needed for wheezing or shortness of breath.    Historical Provider, MD  ALPRAZolam Duanne Moron) 0.25 MG tablet  02/02/17   Historical Provider, MD  ALPRAZolam Duanne Moron) 1 MG tablet Take 1-1.5 mg by mouth 2 (two) times daily. Pt takes one tablet in the morning and one and one-half tablet at night.    Historical Provider, MD  amoxicillin (AMOXIL) 500 MG capsule Take 1 capsule (500 mg total) by mouth 3 (three) times daily. Patient not taking: Reported on 02/20/2017 01/23/17   Earleen Newport, MD  amoxicillin-clavulanate (AUGMENTIN) 875-125 MG tablet take 1 tablet by mouth twice a day for 7 days 11/29/16   Historical Provider, MD  baclofen (LIORESAL) 10 MG tablet Take 10 mg by mouth 3 (three) times daily.    Historical Provider, MD  Calcium Carbonate-Vitamin D (OYSTER SHELL CALCIUM 500 + D PO)  12/26/16   Historical Provider, MD  Calcium Lactate 648 MG TABS Take 648 mg by mouth 2 (two) times daily.    Historical Provider, MD  cloNIDine (CATAPRES) 0.1 MG tablet Take 0.1 mg by mouth at bedtime.     Historical Provider, MD  desvenlafaxine (PRISTIQ) 100 MG 24 hr tablet Take 100 mg by mouth daily. Pt takes 100 mg tab and 50 mg tab for total of 150 mg    Historical Provider, MD  desvenlafaxine (PRISTIQ) 50 MG 24 hr tablet Take 50  mg by mouth daily. Pt takes 100 mg tab and 50 mg tab for total of 150 mg    Historical Provider, MD  Desvenlafaxine ER (PRISTIQ) 50 MG TB24  01/23/17   Historical Provider, MD  Desvenlafaxine ER 100 MG TB24  01/23/17   Historical Provider, MD  diazepam (VALIUM) 5 MG tablet To be taken 3 times daily with Antivert for vertigo as needed Patient not taking: Reported on 02/20/2017 01/23/17   Earleen Newport, MD  esomeprazole (NEXIUM) 40 MG capsule Take 40 mg by mouth 2 (two) times daily.    Historical Provider, MD  fentaNYL (DURAGESIC - DOSED MCG/HR) 50 MCG/HR Place 50 mcg onto the skin every 3  (three) days.    Historical Provider, MD  ferrous sulfate 325 (65 FE) MG tablet Take 1 tablet (325 mg total) by mouth 2 (two) times daily with a meal. 12/26/16   Hillary Bow, MD  fexofenadine (ALLEGRA) 180 MG tablet  01/25/17   Historical Provider, MD  fexofenadine (ALLEGRA) 60 MG tablet Take 60 mg by mouth 2 (two) times daily.    Historical Provider, MD  fluticasone (FLONASE) 50 MCG/ACT nasal spray Place 1 spray into both nostrils 2 (two) times daily.    Historical Provider, MD  folic acid (FOLVITE) 1 MG tablet Take 1 mg by mouth daily.    Historical Provider, MD  hydrOXYzine (ATARAX/VISTARIL) 25 MG tablet Take 25 mg by mouth 3 (three) times daily.    Historical Provider, MD  l-methylfolate-B6-B12 (METANX) 3-35-2 MG TABS tablet Take 1 tablet by mouth 2 (two) times daily.    Historical Provider, MD  meclizine (ANTIVERT) 25 MG tablet Take 1 tablet (25 mg total) by mouth 3 (three) times daily as needed for dizziness or nausea. 01/23/17   Earleen Newport, MD  metoprolol succinate (TOPROL-XL) 100 MG 24 hr tablet Take 100 mg by mouth daily. Take with or immediately following a meal.    Historical Provider, MD  mirtazapine (REMERON) 45 MG tablet Take 45 mg by mouth at bedtime.    Historical Provider, MD  nitroGLYCERIN (NITROSTAT) 0.3 MG SL tablet Place under the tongue.    Historical Provider, MD  olmesartan-hydrochlorothiazide (BENICAR HCT) 40-12.5 MG tablet Take 1 tablet by mouth daily.    Historical Provider, MD  ONE TOUCH ULTRA TEST test strip  01/25/17   Historical Provider, MD  ONGLYZA 5 MG TABS tablet Take 1 tablet by mouth daily. 08/28/16   Historical Provider, MD  oxybutynin (DITROPAN) 5 MG tablet Take 5 mg by mouth 2 (two) times daily.    Historical Provider, MD  oxyCODONE (OXY IR/ROXICODONE) 5 MG immediate release tablet Take 5 mg by mouth every 6 (six) hours as needed for severe pain.    Historical Provider, MD  potassium chloride (K-DUR) 10 MEQ tablet Take 10 mEq by mouth daily.    Historical  Provider, MD  simvastatin (ZOCOR) 40 MG tablet Take 1 tablet by mouth daily. 09/24/16   Historical Provider, MD  sucralfate (CARAFATE) 1 g tablet Take 1 tablet (1 g total) by mouth 4 (four) times daily -  with meals and at bedtime. 09/29/16   Theodoro Grist, MD  Vitamin D, Ergocalciferol, (DRISDOL) 50000 units CAPS capsule Take 1 capsule (50,000 Units total) by mouth every 7 (seven) days. 10/05/16   Theodoro Grist, MD  zolpidem (AMBIEN) 10 MG tablet Take 10 mg by mouth at bedtime as needed for sleep.    Historical Provider, MD    Allergies as of 03/05/2017 - Review  Complete 02/20/2017  Allergen Reaction Noted  . Morphine Hives 04/12/2015  . Iodinated diagnostic agents Hives 04/12/2015  . Aspirin Hives 09/02/2015  . Etodolac Hives 04/12/2015  . Ibuprofen Hives 04/12/2015  . Shellfish allergy Hives 04/12/2015  . Tylenol [acetaminophen] Hives 09/27/2016    Family History  Problem Relation Age of Onset  . Hypertension Father   . COPD Father   . Heart disease Father   . Breast cancer Neg Hx     Social History   Social History  . Marital status: Widowed    Spouse name: N/A  . Number of children: N/A  . Years of education: N/A   Occupational History  . disabled    Social History Main Topics  . Smoking status: Never Smoker  . Smokeless tobacco: Never Used  . Alcohol use No  . Drug use: No  . Sexual activity: Yes   Other Topics Concern  . Not on file   Social History Narrative  . No narrative on file    Review of Systems: See HPI, otherwise negative ROS  Physical Exam: BP 122/82   Pulse 83   Temp (!) 96 F (35.6 C) (Tympanic)   Resp 16   Ht 5\' 7"  (1.702 m)   Wt 298 lb (135.2 kg)   SpO2 100%   BMI 46.67 kg/m  General:   Alert,  pleasant and cooperative in NAD Head:  Normocephalic and atraumatic. Neck:  Supple; no masses or thyromegaly. Lungs:  Clear throughout to auscultation.    Heart:  Regular rate and rhythm. Abdomen:  Soft, nontender and nondistended.  Normal bowel sounds, without guarding, and without rebound.   Neurologic:  Alert and  oriented x4;  grossly normal neurologically.  Impression/Plan: Lindsey [name] is here for an endoscopy, colonoscopy,  possible dilation and capsule endoscopy to evaluate abdominal pain , dysphagia and iron deficiency anemia .   Risks, benefits, limitations, and alternatives regarding  endoscopy, colonoscopy ,dilation and capsule study  have been reviewed with the patient.  Questions have been answered.  All parties agreeable.   Jonathon Bellows, MD  03/13/2017, 8:01 AM

## 2017-03-13 NOTE — Anesthesia Post-op Follow-up Note (Cosign Needed)
Anesthesia QCDR form completed.        

## 2017-03-13 NOTE — OR Nursing (Signed)
Only got to [facility] patient not cleaned out, Poor prep, Video capsule canceled.

## 2017-03-13 NOTE — Anesthesia Preprocedure Evaluation (Signed)
Anesthesia Evaluation  Patient identified by MRN, date of birth, ID band Patient awake    Reviewed: Allergy & Precautions, H&P , NPO status , Patient's Chart, lab work & pertinent test results, reviewed documented beta blocker date and time   Airway Mallampati: II   Neck ROM: full    Dental  (+) Teeth Intact   Pulmonary neg pulmonary ROS, shortness of breath, sleep apnea , COPD,  COPD inhaler,    Pulmonary exam normal        Cardiovascular Exercise Tolerance: Poor hypertension, On Medications (-) angina+ Past MI and + DOE  (-) Orthopnea and (-) PND negative cardio ROS Normal cardiovascular exam Rhythm:regular Rate:Normal     Neuro/Psych  Headaches, Seizures -, Well Controlled,  CVA, No Residual Symptoms negative neurological ROS  negative psych ROS   GI/Hepatic negative GI ROS, Neg liver ROS, PUD, GERD  Medicated,  Endo/Other  negative endocrine ROSdiabetes  Renal/GU Renal diseasenegative Renal ROS  negative genitourinary   Musculoskeletal negative musculoskeletal ROS (+)   Abdominal   Peds negative pediatric ROS (+)  Hematology negative hematology ROS (+) anemia ,   Anesthesia Other Findings Past Medical History: No date: Allergy No date: COPD (chronic obstructive pulmonary disease) (* No date: Diabetes mellitus without complication (HCC) No date: GERD (gastroesophageal reflux disease) No date: Heart attack ([place]) No date: Hyperlipidemia No date: Hypertension No date: PUD (peptic ulcer disease) No date: Sleep apnea No date: Stroke [facility]) Past Surgical History: No date: ABDOMINAL HYSTERECTOMY No date: back surgery No date: broken wrist 01/30/2017: COLONOSCOPY WITH PROPOFOL N/A     Comment: Procedure: COLONOSCOPY WITH PROPOFOL;                Surgeon: Jonathon Bellows, MD;  Location: [facility]               ENDOSCOPY;  Service: Endoscopy;  Laterality:               N/A; 02/20/2017: COLONOSCOPY WITH PROPOFOL N/A  Comment: Procedure: Colonoscopy with propofol ;                Surgeon: Jonathon Bellows, MD;  Location: [facility]               ENDOSCOPY;  Service: Endoscopy;  Laterality:               N/A; 09/28/2016: ESOPHAGOGASTRODUODENOSCOPY (EGD) WITH PROPOFOL N/A     Comment: Procedure: ESOPHAGOGASTRODUODENOSCOPY (EGD)               WITH PROPOFOL;  Surgeon: Manya Silvas, MD;               Location: [facility] ENDOSCOPY;  Service: Endoscopy;               Laterality: N/A; No date: GALLBLADDER SURGERY No date: GASTRIC BYPASS No date: HERNIA REPAIR 1995: JOINT REPLACEMENT No date: REPLACEMENT TOTAL KNEE No date: ROTATOR CUFF REPAIR BMI    Body Mass Index:  46.67 kg/m     Reproductive/Obstetrics negative OB ROS                             Anesthesia Physical Anesthesia Plan  ASA: IV  Anesthesia Plan: General   Post-op Pain Management:    Induction:   Airway Management Planned:   Additional Equipment:   Intra-op Plan:   Post-operative Plan:   Informed Consent: I have reviewed the patients History and Physical, chart, labs and discussed  the procedure including the risks, benefits and alternatives for the proposed anesthesia with the patient or authorized representative who has indicated his/her understanding and acceptance.   Dental Advisory Given  Plan Discussed with: CRNA  Anesthesia Plan Comments:         Anesthesia Quick Evaluation

## 2017-03-13 NOTE — Op Note (Signed)
[facility] Gastroenterology Patient Name: Lindsey [name] Procedure Date: 03/13/2017 8:26 AM MRN: [id number] Account #: [id number] Date of Birth: 02-14-1961 Admit Type: Outpatient Age: 56 Room: [facility] ENDO ROOM 3 Gender: Female Note Status: Finalized Procedure:            Upper GI endoscopy Indications:          Epigastric abdominal pain, Iron deficiency anemia,                        Dysphagia Providers:            Jonathon Bellows MD, MD Referring MD:         Venetia Maxon. Elijio Miles, MD (Referring MD) Medicines:            Monitored Anesthesia Care Complications:        No immediate complications. Procedure:            Pre-Anesthesia Assessment:                       - Prior to the procedure, a History and Physical was                        performed, and patient medications, allergies and                        sensitivities were reviewed. The patient's tolerance of                        previous anesthesia was reviewed.                       - The risks and benefits of the procedure and the                        sedation options and risks were discussed with the                        patient. All questions were answered and informed                        consent was obtained.                       - ASA Grade Assessment: IV - A patient with severe                        systemic disease that is a constant threat to life.                       After obtaining informed consent, the endoscope was                        passed under direct vision. Throughout the procedure,                        the patient's blood pressure, pulse, and oxygen                        saturations were monitored continuously. The Endoscope  was introduced through the mouth, and advanced to the                        jejunum. The upper GI endoscopy was accomplished with                        ease. The patient tolerated the procedure well. Findings:      The examined  jejunum was normal.      The examined esophagus was normal. Biopsies were taken with a cold       forceps for histology.      Evidence of a Roux-en-Y anastomosis was found in the entire examined       stomach. This was characterized by healthy appearing mucosa. Impression:           - Normal examined jejunum.                       - Normal esophagus. Biopsied.                       - A Roux-en-Y anastomosis was found, characterized by                        healthy appearing mucosa. Recommendation:       - Await pathology results.                       - Perform a colonoscopy today. Procedure Code(s):    --- Professional ---                       [phone], Esophagogastroduodenoscopy, flexible, transoral;                        with biopsy, single or multiple Diagnosis Code(s):    --- Professional ---                       Z98.84, Bariatric surgery status                       R10.13, Epigastric pain                       D50.9, Iron deficiency anemia, unspecified                       R13.10, Dysphagia, unspecified CPT copyright 2016 American Medical Association. All rights reserved. The codes documented in this report are preliminary and upon coder review may  be revised to meet current compliance requirements. Jonathon Bellows, MD Jonathon Bellows MD, MD 03/13/2017 8:41:28 AM This report has been signed electronically. Number of Addenda: 0 Note Initiated On: 03/13/2017 8:26 AM      [facility]

## 2017-03-14 ENCOUNTER — Encounter: Payer: Self-pay | Admitting: Gastroenterology

## 2017-03-14 LAB — SURGICAL PATHOLOGY

## 2017-03-14 NOTE — Progress Notes (Signed)
See letter.

## 2017-03-26 ENCOUNTER — Other Ambulatory Visit: Payer: Self-pay

## 2017-03-26 ENCOUNTER — Encounter: Payer: Self-pay | Admitting: Gastroenterology

## 2017-03-26 ENCOUNTER — Ambulatory Visit (INDEPENDENT_AMBULATORY_CARE_PROVIDER_SITE_OTHER): Payer: Medicare Other | Admitting: Gastroenterology

## 2017-03-26 VITALS — BP 103/76 | HR 60 | Temp 97.7°F | Ht 67.0 in | Wt 300.4 lb

## 2017-03-26 DIAGNOSIS — D509 Iron deficiency anemia, unspecified: Secondary | ICD-10-CM

## 2017-03-26 NOTE — Progress Notes (Signed)
Primary Care Physician: Volanda Napoleon, MD  Primary Gastroenterologist:  Dr. Jonathon Bellows   Chief Complaint  Patient presents with  . Follow-up    HPI: Lindsey [name] is a 56 y.o. female   She is here today for a follow up visit. Last visit 01/24/17   Summary of history : She was admitted to the hospital on 12/23/16 . She was noted to have bright red blood in her toilet bowel after wiping. At that time her Hb was 9 grams, MCV 78, ferritin of 4 , normal B12,folate ,urine analysis   . No overt blood loss was noted.She was given IV iron, Plan was for outpatient endoscopy .She has ahistory of gastric bypass, GERD,CAD,DM. She has a history of anemia partially worked up in 2013-2014 . EGD 09/2016- normal , was having rectal bleeding , weight loss.   Interval history   01/24/2017-  03/26/2017    03/13/17- EGD- Roux en y normal anatomy ofgastric bypass.  Colonoscopy was not completed due to poor prep- plan was to discuss further at the outpatient. She has had 3 attempts at the procedure.       Current Outpatient Prescriptions  Medication Sig Dispense Refill  . albuterol (PROVENTIL HFA;VENTOLIN HFA) 108 (90 BASE) MCG/ACT inhaler Inhale 2 puffs into the lungs every 4 (four) hours as needed for wheezing or shortness of breath.    Marland Kitchen albuterol (PROVENTIL) (2.5 MG/3ML) 0.083% nebulizer solution Take 2.5 mg by nebulization every 6 (six) hours as needed for wheezing or shortness of breath.    . ALPRAZolam (XANAX) 0.25 MG tablet     . ALPRAZolam (XANAX) 1 MG tablet Take 1-1.5 mg by mouth 2 (two) times daily. Pt takes one tablet in the morning and one and one-half tablet at night.    Marland Kitchen amoxicillin (AMOXIL) 500 MG capsule Take 1 capsule (500 mg total) by mouth 3 (three) times daily. (Patient not taking: Reported on 02/20/2017) 30 capsule 0  . amoxicillin-clavulanate (AUGMENTIN) 875-125 MG tablet take 1 tablet by mouth twice a day for 7 days  0  . baclofen (LIORESAL) 10 MG tablet Take 10 mg by  mouth 3 (three) times daily.    . Calcium Carbonate-Vitamin D (OYSTER SHELL CALCIUM 500 + D PO)   0  . Calcium Lactate 648 MG TABS Take 648 mg by mouth 2 (two) times daily.    . cloNIDine (CATAPRES) 0.1 MG tablet Take 0.1 mg by mouth at bedtime.     Marland Kitchen desvenlafaxine (PRISTIQ) 100 MG 24 hr tablet Take 100 mg by mouth daily. Pt takes 100 mg tab and 50 mg tab for total of 150 mg    . desvenlafaxine (PRISTIQ) 50 MG 24 hr tablet Take 50 mg by mouth daily. Pt takes 100 mg tab and 50 mg tab for total of 150 mg    . Desvenlafaxine ER (PRISTIQ) 50 MG TB24     . Desvenlafaxine ER 100 MG TB24     . diazepam (VALIUM) 5 MG tablet To be taken 3 times daily with Antivert for vertigo as needed (Patient not taking: Reported on 02/20/2017) 20 tablet 0  . esomeprazole (NEXIUM) 40 MG capsule Take 40 mg by mouth 2 (two) times daily.    . fentaNYL (DURAGESIC - DOSED MCG/HR) 50 MCG/HR Place 50 mcg onto the skin every 3 (three) days.    . ferrous sulfate 325 (65 FE) MG tablet Take 1 tablet (325 mg total) by mouth 2 (two) times daily with a meal. 60 tablet  0  . fexofenadine (ALLEGRA) 180 MG tablet   0  . fexofenadine (ALLEGRA) 60 MG tablet Take 60 mg by mouth 2 (two) times daily.    . fluticasone (FLONASE) 50 MCG/ACT nasal spray Place 1 spray into both nostrils 2 (two) times daily.    . folic acid (FOLVITE) 1 MG tablet Take 1 mg by mouth daily.    . hydrOXYzine (ATARAX/VISTARIL) 25 MG tablet Take 25 mg by mouth 3 (three) times daily.    Marland Kitchen l-methylfolate-B6-B12 (METANX) 3-35-2 MG TABS tablet Take 1 tablet by mouth 2 (two) times daily.    . meclizine (ANTIVERT) 25 MG tablet Take 1 tablet (25 mg total) by mouth 3 (three) times daily as needed for dizziness or nausea. 30 tablet 1  . metoprolol succinate (TOPROL-XL) 100 MG 24 hr tablet Take 100 mg by mouth daily. Take with or immediately following a meal.    . mirtazapine (REMERON) 45 MG tablet Take 45 mg by mouth at bedtime.    . nitroGLYCERIN (NITROSTAT) 0.3 MG SL tablet  Place under the tongue.    Marland Kitchen olmesartan-hydrochlorothiazide (BENICAR HCT) 40-12.5 MG tablet Take 1 tablet by mouth daily.    . ONE TOUCH ULTRA TEST test strip     . ONGLYZA 5 MG TABS tablet Take 1 tablet by mouth daily.    Marland Kitchen oxybutynin (DITROPAN) 5 MG tablet Take 5 mg by mouth 2 (two) times daily.    Marland Kitchen oxyCODONE (OXY IR/ROXICODONE) 5 MG immediate release tablet Take 5 mg by mouth every 6 (six) hours as needed for severe pain.    Marland Kitchen Phendimetrazine Tartrate 35 MG TABS   0  . potassium chloride (K-DUR) 10 MEQ tablet Take 10 mEq by mouth daily.    . simvastatin (ZOCOR) 40 MG tablet Take 1 tablet by mouth daily.    . sucralfate (CARAFATE) 1 g tablet Take 1 tablet (1 g total) by mouth 4 (four) times daily -  with meals and at bedtime. 120 tablet 5  . SUPREP BOWEL PREP KIT 17.5-3.13-1.6 GM/180ML SOLN     . Vitamin D, Ergocalciferol, (DRISDOL) 50000 units CAPS capsule Take 1 capsule (50,000 Units total) by mouth every 7 (seven) days. 30 capsule 5  . zolpidem (AMBIEN) 10 MG tablet Take 10 mg by mouth at bedtime as needed for sleep.     No current facility-administered medications for this visit.     Allergies as of 03/26/2017 - Review Complete 03/26/2017  Allergen Reaction Noted  . Morphine Hives 04/12/2015  . Iodinated diagnostic agents Hives 04/12/2015  . Aspirin Hives 09/02/2015  . Etodolac Hives 04/12/2015  . Ibuprofen Hives 04/12/2015  . Shellfish allergy Hives 04/12/2015  . Tylenol [acetaminophen] Hives 09/27/2016    ROS:  General: Negative for anorexia, weight loss, fever, chills, fatigue, weakness. ENT: Negative for hoarseness, difficulty swallowing , nasal congestion. CV: Negative for chest pain, angina, palpitations, dyspnea on exertion, peripheral edema.  Respiratory: Negative for dyspnea at rest, dyspnea on exertion, cough, sputum, wheezing.  GI: See history of present illness. GU:  Negative for dysuria, hematuria, urinary incontinence, urinary frequency, nocturnal urination.    Endo: Negative for unusual weight change.    Physical Examination:   BP 103/76 (BP Location: Left Arm, Patient Position: Sitting, Cuff Size: Large)   Pulse 60   Temp 97.7 F (36.5 C) (Oral)   Ht _0  (1.702 m)   Wt (!) 300 lb 6.4 oz (136.3 kg)   BMI 47.05 kg/m   General: Well-nourished, well-developed in no  acute distress.  Eyes: No icterus. Conjunctivae pink. Mouth: Oropharyngeal mucosa moist and pink , no lesions erythema or exudate. Lungs: Clear to auscultation bilaterally. Non-labored. Heart: Regular rate and rhythm, no murmurs rubs or gallops.  Abdomen: Bowel sounds are normal, nontender, nondistended, no hepatosplenomegaly or masses, no abdominal bruits or hernia , no rebound or guarding.   Extremities: No lower extremity edema. No clubbing or deformities. Neuro: Alert and oriented x 3.  Grossly intact. Skin: Warm and dry, no jaundice.   Psych: Alert and cooperative, normal mood and affect.  Imaging Studies: No results found.  Assessment and Plan:   Lindsey [name] is a 56 y.o. y/o female  here for a follow up for iron deficiency anemia and rectal bleeding . She had her gastric bypass in 2007 which is likely the cause of her anemia. I have attempted to perform the colonoscopy on 3 occasions but she has had unsuccessfully bowel preps.  Plan   1. 2 day bowel prep with golytely - 2 gallons and attempt colonoscopy and if negative then capsule study of small bowel .  Dr Jonathon Bellows  MD Follow up in 8 weeks

## 2017-03-26 NOTE — Anesthesia Postprocedure Evaluation (Signed)
Anesthesia Post Note  Patient: Vevelyn Pat  Procedure(s) Performed: Procedure(s) (LRB): GIVENS CAPSULE STUDY (N/A) COLONOSCOPY WITH PROPOFOL (N/A) ESOPHAGOGASTRODUODENOSCOPY (EGD) WITH PROPOFOL (N/A)  Patient location during evaluation: PACU Anesthesia Type: General Level of consciousness: awake and alert Pain management: pain level controlled Vital Signs Assessment: post-procedure vital signs reviewed and stable Respiratory status: spontaneous breathing, nonlabored ventilation, respiratory function stable and patient connected to nasal cannula oxygen Cardiovascular status: blood pressure returned to baseline and stable Postop Assessment: no signs of nausea or vomiting Anesthetic complications: no     Last Vitals:  Vitals:   03/13/17 0915 03/13/17 0925  BP: 102/71 111/74  Pulse: 76 84  Resp: 18 19  Temp:      Last Pain:  Vitals:   03/14/17 0732  TempSrc:   PainSc: 0-No pain                 Molli Barrows

## 2017-03-27 ENCOUNTER — Telehealth: Payer: Self-pay | Admitting: Gastroenterology

## 2017-03-27 NOTE — Telephone Encounter (Signed)
03/27/17 [employer] website

## 2017-03-27 NOTE — Telephone Encounter (Signed)
03/27/17 Per [employer] website NO prior Josem Kaufmann is required for Colonoscopy [place] / D50.9 Decision ID#: [id number].

## 2017-04-10 ENCOUNTER — Encounter: Payer: Self-pay | Admitting: *Deleted

## 2017-04-11 ENCOUNTER — Encounter: Payer: Self-pay | Admitting: Anesthesiology

## 2017-04-11 ENCOUNTER — Other Ambulatory Visit: Payer: Self-pay

## 2017-04-11 DIAGNOSIS — D509 Iron deficiency anemia, unspecified: Secondary | ICD-10-CM

## 2017-04-12 ENCOUNTER — Other Ambulatory Visit: Payer: Self-pay

## 2017-04-12 ENCOUNTER — Telehealth: Payer: Self-pay | Admitting: Gastroenterology

## 2017-04-12 NOTE — Telephone Encounter (Signed)
04/12/17 9:39 am  Lindsey [name] w/ [facility] informed of date/place change for colonoscopy.. Pt moved from 5/23 @ [place] to  04/17/17 @ [facility]

## 2017-04-16 ENCOUNTER — Encounter: Payer: Self-pay | Admitting: *Deleted

## 2017-04-17 ENCOUNTER — Ambulatory Visit
Admission: RE | Admit: 2017-04-17 | Discharge: 2017-04-17 | Disposition: A | Discharge status: Home / Self Care | Payer: Medicare Other | Source: Ambulatory Visit | Attending: Gastroenterology | Admitting: Gastroenterology

## 2017-04-17 ENCOUNTER — Encounter
Admission: RE | Disposition: A | Discharge status: Home / Self Care | Payer: Self-pay | Source: Ambulatory Visit | Attending: Gastroenterology

## 2017-04-17 ENCOUNTER — Ambulatory Visit: Payer: Medicare Other | Admitting: Anesthesiology

## 2017-04-17 ENCOUNTER — Encounter: Payer: Self-pay | Admitting: Anesthesiology

## 2017-04-17 DIAGNOSIS — D509 Iron deficiency anemia, unspecified: Secondary | ICD-10-CM | POA: Diagnosis not present

## 2017-04-17 DIAGNOSIS — I10 Essential (primary) hypertension: Secondary | ICD-10-CM | POA: Insufficient documentation

## 2017-04-17 DIAGNOSIS — Z886 Allergy status to analgesic agent status: Secondary | ICD-10-CM | POA: Insufficient documentation

## 2017-04-17 DIAGNOSIS — Z96659 Presence of unspecified artificial knee joint: Secondary | ICD-10-CM | POA: Diagnosis not present

## 2017-04-17 DIAGNOSIS — E119 Type 2 diabetes mellitus without complications: Secondary | ICD-10-CM | POA: Insufficient documentation

## 2017-04-17 DIAGNOSIS — E785 Hyperlipidemia, unspecified: Secondary | ICD-10-CM | POA: Insufficient documentation

## 2017-04-17 DIAGNOSIS — Z91041 Radiographic dye allergy status: Secondary | ICD-10-CM | POA: Insufficient documentation

## 2017-04-17 DIAGNOSIS — J449 Chronic obstructive pulmonary disease, unspecified: Secondary | ICD-10-CM | POA: Insufficient documentation

## 2017-04-17 DIAGNOSIS — M170 Bilateral primary osteoarthritis of knee: Secondary | ICD-10-CM | POA: Diagnosis not present

## 2017-04-17 DIAGNOSIS — Z9884 Bariatric surgery status: Secondary | ICD-10-CM | POA: Diagnosis not present

## 2017-04-17 DIAGNOSIS — Z91013 Allergy to seafood: Secondary | ICD-10-CM | POA: Diagnosis not present

## 2017-04-17 DIAGNOSIS — Z969 Presence of functional implant, unspecified: Secondary | ICD-10-CM | POA: Insufficient documentation

## 2017-04-17 DIAGNOSIS — Z885 Allergy status to narcotic agent status: Secondary | ICD-10-CM | POA: Insufficient documentation

## 2017-04-17 DIAGNOSIS — Z90710 Acquired absence of both cervix and uterus: Secondary | ICD-10-CM | POA: Insufficient documentation

## 2017-04-17 DIAGNOSIS — G4730 Sleep apnea, unspecified: Secondary | ICD-10-CM | POA: Diagnosis not present

## 2017-04-17 DIAGNOSIS — Z7951 Long term (current) use of inhaled steroids: Secondary | ICD-10-CM | POA: Diagnosis not present

## 2017-04-17 DIAGNOSIS — Z79891 Long term (current) use of opiate analgesic: Secondary | ICD-10-CM | POA: Insufficient documentation

## 2017-04-17 DIAGNOSIS — K219 Gastro-esophageal reflux disease without esophagitis: Secondary | ICD-10-CM | POA: Diagnosis not present

## 2017-04-17 DIAGNOSIS — Z8711 Personal history of peptic ulcer disease: Secondary | ICD-10-CM | POA: Insufficient documentation

## 2017-04-17 DIAGNOSIS — Z8673 Personal history of transient ischemic attack (TIA), and cerebral infarction without residual deficits: Secondary | ICD-10-CM | POA: Diagnosis not present

## 2017-04-17 DIAGNOSIS — Z79899 Other long term (current) drug therapy: Secondary | ICD-10-CM | POA: Diagnosis not present

## 2017-04-17 DIAGNOSIS — I252 Old myocardial infarction: Secondary | ICD-10-CM | POA: Diagnosis not present

## 2017-04-17 HISTORY — PX: COLONOSCOPY WITH PROPOFOL: SHX5780

## 2017-04-17 LAB — GLUCOSE, CAPILLARY: GLUCOSE-CAPILLARY: 129 mg/dL — AB (ref 65–99)

## 2017-04-17 SURGERY — COLONOSCOPY WITH PROPOFOL
Anesthesia: General

## 2017-04-17 MED ORDER — PROPOFOL 10 MG/ML IV BOLUS
INTRAVENOUS | Status: DC | PRN
  Administered 2017-04-17: 30 mg via INTRAVENOUS
  Administered 2017-04-17: 70 mg via INTRAVENOUS

## 2017-04-17 MED ORDER — GLYCOPYRROLATE 0.2 MG/ML IJ SOLN
INTRAMUSCULAR | Status: AC
  Filled 2017-04-17: qty 1

## 2017-04-17 MED ORDER — PROPOFOL 500 MG/50ML IV EMUL
INTRAVENOUS | Status: AC
  Filled 2017-04-17: qty 50

## 2017-04-17 MED ORDER — SODIUM CHLORIDE 0.9 % IV SOLN
INTRAVENOUS | Status: DC
  Administered 2017-04-17: 1000 mL via INTRAVENOUS

## 2017-04-17 MED ORDER — GLYCOPYRROLATE 0.2 MG/ML IJ SOLN
INTRAMUSCULAR | Status: DC | PRN
  Administered 2017-04-17: 0.2 mg via INTRAVENOUS

## 2017-04-17 MED ORDER — PHENYLEPHRINE HCL 10 MG/ML IJ SOLN
INTRAMUSCULAR | Status: DC | PRN
  Administered 2017-04-17: 100 ug via INTRAVENOUS
  Administered 2017-04-17 (×2): 200 ug via INTRAVENOUS

## 2017-04-17 MED ORDER — PROPOFOL 10 MG/ML IV BOLUS
INTRAVENOUS | Status: AC
  Filled 2017-04-17: qty 20

## 2017-04-17 MED ORDER — PROPOFOL 500 MG/50ML IV EMUL
INTRAVENOUS | Status: DC | PRN
  Administered 2017-04-17: 150 ug/kg/min via INTRAVENOUS

## 2017-04-17 NOTE — Anesthesia Post-op Follow-up Note (Cosign Needed)
Anesthesia QCDR form completed.        

## 2017-04-17 NOTE — Transfer of Care (Signed)
Immediate Anesthesia Transfer of Care Note  Patient: Lindsey [name]  Procedure(s) Performed: Procedure(s): COLONOSCOPY WITH PROPOFOL (N/A)  Patient Location: PACU  Anesthesia Type:General  Level of Consciousness: drowsy and patient cooperative  Airway & Oxygen Therapy: Patient Spontanous Breathing and Patient connected to nasal cannula oxygen  Post-op Assessment: Report given to RN and Post -op Vital signs reviewed and stable  Post vital signs: Reviewed and stable  Last Vitals:  Vitals:   04/17/17 0759 04/17/17 0906  BP: 122/85 126/79  Pulse: 71 (!) 59  Resp: 20 (!) 24  Temp: (!) 35.6 C 36.1 C    Last Pain:  Vitals:   04/17/17 0906  TempSrc: Tympanic         Complications: No apparent anesthesia complications

## 2017-04-17 NOTE — Anesthesia Preprocedure Evaluation (Addendum)
Anesthesia Evaluation  Patient identified by MRN, date of birth, ID band Patient awake    Reviewed: Allergy & Precautions, NPO status , Patient's Chart, lab work & pertinent test results, reviewed documented beta blocker date and time   Airway Mallampati: III  TM Distance: >3 FB     Dental  (+) Upper Dentures, Lower Dentures   Pulmonary shortness of breath, sleep apnea , COPD,           Cardiovascular hypertension, + Past MI       Neuro/Psych  Headaches, Seizures -,  CVA, No Residual Symptoms    GI/Hepatic PUD, GERD  ,  Endo/Other  diabetes, Type 2  Renal/GU Renal disease     Musculoskeletal  (+) Arthritis ,   Abdominal   Peds  Hematology  (+) anemia ,   Anesthesia Other Findings   Reproductive/Obstetrics                            Anesthesia Physical Anesthesia Plan  ASA: III  Anesthesia Plan: General   Post-op Pain Management:    Induction: Intravenous  Airway Management Planned:   Additional Equipment:   Intra-op Plan:   Post-operative Plan:   Informed Consent: I have reviewed the patients History and Physical, chart, labs and discussed the procedure including the risks, benefits and alternatives for the proposed anesthesia with the patient or authorized representative who has indicated his/her understanding and acceptance.     Plan Discussed with: CRNA  Anesthesia Plan Comments:         Anesthesia Quick Evaluation

## 2017-04-17 NOTE — Anesthesia Postprocedure Evaluation (Signed)
Anesthesia Post Note  Patient: Lindsey [name]  Procedure(s) Performed: Procedure(s) (LRB): COLONOSCOPY WITH PROPOFOL (N/A)  Patient location during evaluation: PACU Anesthesia Type: General Level of consciousness: awake and alert Pain management: pain level controlled Vital Signs Assessment: post-procedure vital signs reviewed and stable Respiratory status: spontaneous breathing, nonlabored ventilation, respiratory function stable and patient connected to nasal cannula oxygen Cardiovascular status: blood pressure returned to baseline and stable Postop Assessment: no signs of nausea or vomiting Anesthetic complications: no     Last Vitals:  Vitals:   04/17/17 0936 04/17/17 0946  BP: (!) 97/57 (!) 96/52  Pulse: 66 65  Resp: 14 16  Temp:      Last Pain:  Vitals:   04/17/17 0906  TempSrc: Tympanic                 Lydia Meng S

## 2017-04-17 NOTE — H&P (Signed)
Jonathon Bellows MD [address], [place], [place] [address] Phone: [phone] Fax : [phone]  Primary Care Physician:  Jodi Marble, MD Primary Gastroenterologist:  Dr. Jonathon Bellows   Pre-Procedure History & Physical: HPI:  Lindsey [name] is a 56 y.o. female is here for an colonoscopy.   Past Medical History:  Diagnosis Date  . Allergy   . Anemia   . Arthritis    knees  . COPD (chronic obstructive pulmonary disease) ([place])   . Diabetes mellitus without complication (HCC)    diet controlled  . GERD (gastroesophageal reflux disease)   . Heart attack ([place]) 2002  . Hyperlipidemia   . Hypertension   . PUD (peptic ulcer disease)   . Sleep apnea    can't tolerate CPAP.  Uses O2 only  . Spinal cord stimulator status    for lower back pain  . Stroke [facility]) 2004   "mini-stroke"  . Vertigo   . Wears dentures    full upper and lower    Past Surgical History:  Procedure Laterality Date  . ABDOMINAL HYSTERECTOMY    . back surgery    . broken wrist    . COLONOSCOPY WITH PROPOFOL N/A 01/30/2017   Procedure: COLONOSCOPY WITH PROPOFOL;  Surgeon: Jonathon Bellows, MD;  Location: [facility] ENDOSCOPY;  Service: Endoscopy;  Laterality: N/A;  . COLONOSCOPY WITH PROPOFOL N/A 02/20/2017   Procedure: Colonoscopy with propofol ;  Surgeon: Jonathon Bellows, MD;  Location: [facility] ENDOSCOPY;  Service: Endoscopy;  Laterality: N/A;  . COLONOSCOPY WITH PROPOFOL N/A 03/13/2017   Procedure: COLONOSCOPY WITH PROPOFOL;  Surgeon: Jonathon Bellows, MD;  Location: [facility] ENDOSCOPY;  Service: Endoscopy;  Laterality: N/A;  . ESOPHAGOGASTRODUODENOSCOPY (EGD) WITH PROPOFOL N/A 09/28/2016   Procedure: ESOPHAGOGASTRODUODENOSCOPY (EGD) WITH PROPOFOL;  Surgeon: Manya Silvas, MD;  Location: [facility] ENDOSCOPY;  Service: Endoscopy;  Laterality: N/A;  . ESOPHAGOGASTRODUODENOSCOPY (EGD) WITH PROPOFOL N/A 03/13/2017   Procedure: ESOPHAGOGASTRODUODENOSCOPY (EGD) WITH PROPOFOL;  Surgeon: Jonathon Bellows, MD;  Location: [facility] ENDOSCOPY;   Service: Endoscopy;  Laterality: N/A;  . GALLBLADDER SURGERY    . GASTRIC BYPASS    . GIVENS CAPSULE STUDY N/A 03/13/2017   Procedure: GIVENS CAPSULE STUDY;  Surgeon: Jonathon Bellows, MD;  Location: [facility] ENDOSCOPY;  Service: Endoscopy;  Laterality: N/A;  . HERNIA REPAIR    . JOINT REPLACEMENT  1995  . REPLACEMENT TOTAL KNEE    . ROTATOR CUFF REPAIR      Prior to Admission medications   Medication Sig Start Date End Date Taking? Authorizing Provider  albuterol (PROVENTIL HFA;VENTOLIN HFA) 108 (90 BASE) MCG/ACT inhaler Inhale 2 puffs into the lungs every 4 (four) hours as needed for wheezing or shortness of breath.   Yes [provider]  albuterol (PROVENTIL) (2.5 MG/3ML) 0.083% nebulizer solution Take 2.5 mg by nebulization every 6 (six) hours as needed for wheezing or shortness of breath.   Yes [provider]  Alendronate Sodium (FOSAMAX PO) Take by mouth.   Yes [provider]  ALPRAZolam Duanne Moron) 0.25 MG tablet  02/02/17  Yes [provider]  baclofen (LIORESAL) 10 MG tablet Take 10 mg by mouth 3 (three) times daily.   Yes [provider]  Calcium Carbonate-Vitamin D (OYSTER SHELL CALCIUM 500 + D PO)  12/26/16  Yes [provider]  Calcium Lactate 648 MG TABS Take 648 mg by mouth 2 (two) times daily.   Yes [provider]  cloNIDine (CATAPRES) 0.1 MG tablet Take 0.1 mg by mouth at bedtime.  Yes [provider]  desvenlafaxine (PRISTIQ) 100 MG 24 hr tablet Take 100 mg by mouth daily. Pt takes 100 mg tab and 50 mg tab for total of 150 mg   Yes [provider]  desvenlafaxine (PRISTIQ) 50 MG 24 hr tablet Take 50 mg by mouth daily. Pt takes 100 mg tab and 50 mg tab for total of 150 mg   Yes [provider]  Desvenlafaxine ER (PRISTIQ) 50 MG TB24  03/15/17  Yes [provider]  esomeprazole (NEXIUM) 40 MG capsule Take 40 mg by mouth 2 (two) times daily.   Yes [provider]  fentaNYL (DURAGESIC  - DOSED MCG/HR) 50 MCG/HR Place 50 mcg onto the skin every 3 (three) days.   Yes [provider]  ferrous sulfate 325 (65 FE) MG tablet Take 1 tablet (325 mg total) by mouth 2 (two) times daily with a meal. 12/26/16  Yes Hillary Bow, MD  fexofenadine (ALLEGRA) 180 MG tablet  01/25/17  Yes [provider]  fluticasone (FLONASE) 50 MCG/ACT nasal spray Place 1 spray into both nostrils 2 (two) times daily.   Yes [provider]  folic acid (FOLVITE) 1 MG tablet Take 1 mg by mouth daily.   Yes [provider]  hydrOXYzine (ATARAX/VISTARIL) 25 MG tablet Take 25 mg by mouth 3 (three) times daily.   Yes [provider]  l-methylfolate-B6-B12 (METANX) 3-35-2 MG TABS tablet Take 1 tablet by mouth 2 (two) times daily.   Yes [provider]  meclizine (ANTIVERT) 25 MG tablet Take 1 tablet (25 mg total) by mouth 3 (three) times daily as needed for dizziness or nausea. 01/23/17  Yes Earleen Newport, MD  metoprolol succinate (TOPROL-XL) 100 MG 24 hr tablet Take 100 mg by mouth daily. Take with or immediately following a meal.   Yes [provider]  mirtazapine (REMERON) 45 MG tablet Take 45 mg by mouth at bedtime.   Yes [provider]  nitroGLYCERIN (NITROSTAT) 0.3 MG SL tablet Place under the tongue.   Yes [provider]  olmesartan-hydrochlorothiazide (BENICAR HCT) 40-12.5 MG tablet Take 1 tablet by mouth daily.   Yes [provider]  ONE TOUCH ULTRA TEST test strip  01/25/17  Yes [provider]  ONGLYZA 5 MG TABS tablet Take 1 tablet by mouth daily. 08/28/16  Yes [provider]  oxybutynin (DITROPAN) 5 MG tablet Take 5 mg by mouth 2 (two) times daily.   Yes [provider]  oxyCODONE (OXY IR/ROXICODONE) 5 MG immediate release tablet Take 5 mg by mouth every 6 (six) hours as needed for severe pain.   Yes [provider]  potassium chloride (K-DUR) 10 MEQ tablet Take 10 mEq by mouth  daily.   Yes [provider]  simvastatin (ZOCOR) 40 MG tablet Take 1 tablet by mouth daily. 09/24/16  Yes [provider]  sucralfate (CARAFATE) 1 g tablet  02/21/17  Yes [provider]  [place] KIT 17.5-3.13-1.6 GM/180ML SOLN  02/07/17  Yes [provider]  tamsulosin (FLOMAX) 0.4 MG CAPS capsule Take 0.4 mg by mouth daily. 04/09/17  Yes [provider]  Vitamin D, Ergocalciferol, (DRISDOL) 50000 units CAPS capsule Take 1 capsule (50,000 Units total) by mouth every 7 (seven) days. 10/05/16  Yes Theodoro Grist, MD  zolpidem (AMBIEN) 10 MG tablet Take 10 mg by mouth at bedtime as needed for sleep.   Yes [provider]  ALPRAZolam Duanne Moron) 1 MG tablet Take 1-1.5 mg by  mouth 2 (two) times daily. Pt takes one tablet in the morning and one and one-half tablet at night.    [provider]  amoxicillin (AMOXIL) 500 MG capsule take 1 capsule by mouth three times a day 01/23/17   [provider]  Desvenlafaxine ER 100 MG TB24  03/15/17   [provider]  diazepam (VALIUM) 5 MG tablet To be taken 3 times daily with Antivert for vertigo as needed 01/23/17   Earleen Newport, MD  Phendimetrazine Tartrate 35 MG TABS  03/22/17   [provider]    Allergies as of 04/11/2017 - Review Complete 04/10/2017  Allergen Reaction Noted  . Morphine Hives 04/12/2015  . Iodinated diagnostic agents Hives 04/12/2015  . Aspirin Hives 09/02/2015  . Etodolac Hives 04/12/2015  . Ibuprofen Hives 04/12/2015  . Shellfish allergy Hives 04/12/2015  . Tylenol [acetaminophen] Hives 09/27/2016    Family History  Problem Relation Age of Onset  . Hypertension Father   . COPD Father   . Heart disease Father   . Breast cancer Neg Hx     Social History   Social History  . Marital status: Widowed    Spouse name: N/A  . Number of children: N/A  . Years of education: N/A   Occupational History  . disabled    Social History  Main Topics  . Smoking status: Never Smoker  . Smokeless tobacco: Never Used  . Alcohol use No  . Drug use: No  . Sexual activity: Yes   Other Topics Concern  . Not on file   Social History Narrative  . No narrative on file    Review of Systems: See HPI, otherwise negative ROS  Physical Exam: BP 122/85   Pulse 71   Temp (!) 96.1 F (35.6 C) (Tympanic)   Resp 20  General:   Alert,  pleasant and cooperative in NAD Head:  Normocephalic and atraumatic. Neck:  Supple; no masses or thyromegaly. Lungs:  Clear throughout to auscultation.    Heart:  Regular rate and rhythm. Abdomen:  Soft, nontender and nondistended. Normal bowel sounds, without guarding, and without rebound.   Neurologic:  Alert and  oriented x4;  grossly normal neurologically.  Impression/Plan: Lindsey [name] is here for an colonoscopy to be performed for iron deficiency anemia   Risks, benefits, limitations, and alternatives regarding  colonoscopy have been reviewed with the patient.  Questions have been answered.  All parties agreeable.   Jonathon Bellows, MD  04/17/2017, 8:32 AM

## 2017-04-17 NOTE — Op Note (Signed)
[facility] Patient Name: Lindsey [name] Procedure Date: 04/17/2017 8:33 AM MRN: [id number] Account #: [id number] Date of Birth: 1961/07/18 Admit Type: Outpatient Age: 56 Room: [facility] ENDO ROOM 1 Gender: Female Note Status: Finalized Procedure:            Colonoscopy Indications:          Iron deficiency anemia Providers:            Jonathon Bellows MD, MD Referring MD:         Venetia Maxon. Elijio Miles, MD (Referring MD) Medicines:            Monitored Anesthesia Care Complications:        No immediate complications. Procedure:            Pre-Anesthesia Assessment:                       - Prior to the procedure, a History and Physical was                        performed, and patient medications, allergies and                        sensitivities were reviewed. The patient's tolerance of                        previous anesthesia was reviewed.                       - The risks and benefits of the procedure and the                        sedation options and risks were discussed with the                        patient. All questions were answered and informed                        consent was obtained.                       - ASA Grade Assessment: III - A patient with severe                        systemic disease.                       After obtaining informed consent, the colonoscope was                        passed under direct vision. Throughout the procedure,                        the patient's blood pressure, pulse, and oxygen                        saturations were monitored continuously. The                        Colonoscope was introduced through the anus and  advanced to the the cecum, identified by the                        appendiceal orifice, IC valve and transillumination.                        The colonoscopy was somewhat difficult due to a                        tortuous colon. The patient tolerated the procedure                         well. The quality of the bowel preparation was adequate                        to identify polyps. Findings:      The perianal and digital rectal examinations were normal.      The exam was otherwise without abnormality on direct and retroflexion       views. Impression:           - The examination was otherwise normal on direct and                        retroflexion views.                       - No specimens collected. Recommendation:       - Discharge patient to home (with escort).                       - Resume previous diet.                       - Continue present medications.                       - To visualize the small bowel, perform video capsule                        endoscopy today.                       - Repeat colonoscopy in 10 years for screening purposes.                       - Return to GI office in 6 weeks. Procedure Code(s):    --- Professional ---                       [phone], Colonoscopy, flexible; diagnostic, including                        collection of specimen(s) by brushing or washing, when                        performed (separate procedure) Diagnosis Code(s):    --- Professional ---                       D50.9, Iron deficiency anemia, unspecified CPT copyright 2016 American Medical Association. All rights reserved. The codes documented in this report are preliminary and upon coder review may  be revised to  meet current compliance requirements. Jonathon Bellows, MD Jonathon Bellows MD, MD 04/17/2017 9:00:20 AM This report has been signed electronically. Number of Addenda: 0 Note Initiated On: 04/17/2017 8:33 AM Scope Withdrawal Time: 0 hours 8 minutes 13 seconds  Total Procedure Duration: 0 hours 14 minutes 53 seconds       [facility]

## 2017-04-18 ENCOUNTER — Ambulatory Visit: Admission: RE | Admit: 2017-04-18 | Payer: Medicare Other | Source: Ambulatory Visit | Admitting: Gastroenterology

## 2017-04-18 HISTORY — DX: Presence of dental prosthetic device (complete) (partial): Z97.2

## 2017-04-18 HISTORY — DX: Dizziness and giddiness: R42

## 2017-04-18 HISTORY — DX: Unspecified osteoarthritis, unspecified site: M19.90

## 2017-04-18 HISTORY — DX: Anemia, unspecified: D64.9

## 2017-04-18 HISTORY — DX: Presence of other specified functional implants: Z96.89

## 2017-04-18 SURGERY — COLONOSCOPY WITH PROPOFOL
Anesthesia: Choice

## 2017-04-19 ENCOUNTER — Encounter: Payer: Self-pay | Admitting: Gastroenterology

## 2017-05-03 ENCOUNTER — Emergency Department
Admission: EM | Admit: 2017-05-03 | Discharge: 2017-05-03 | Disposition: A | Discharge status: Home / Self Care | Payer: Medicare Other | Attending: Emergency Medicine | Admitting: Emergency Medicine

## 2017-05-03 ENCOUNTER — Emergency Department: Payer: Medicare Other

## 2017-05-03 ENCOUNTER — Encounter: Payer: Self-pay | Admitting: Emergency Medicine

## 2017-05-03 DIAGNOSIS — J449 Chronic obstructive pulmonary disease, unspecified: Secondary | ICD-10-CM | POA: Diagnosis not present

## 2017-05-03 DIAGNOSIS — N10 Acute tubulo-interstitial nephritis: Secondary | ICD-10-CM | POA: Insufficient documentation

## 2017-05-03 DIAGNOSIS — R1030 Lower abdominal pain, unspecified: Secondary | ICD-10-CM | POA: Diagnosis not present

## 2017-05-03 DIAGNOSIS — I10 Essential (primary) hypertension: Secondary | ICD-10-CM | POA: Diagnosis not present

## 2017-05-03 DIAGNOSIS — R300 Dysuria: Secondary | ICD-10-CM | POA: Diagnosis present

## 2017-05-03 DIAGNOSIS — Z79899 Other long term (current) drug therapy: Secondary | ICD-10-CM | POA: Insufficient documentation

## 2017-05-03 DIAGNOSIS — N12 Tubulo-interstitial nephritis, not specified as acute or chronic: Secondary | ICD-10-CM

## 2017-05-03 LAB — BASIC METABOLIC PANEL
ANION GAP: 6 (ref 5–15)
BUN: 20 mg/dL (ref 6–20)
CALCIUM: 8.9 mg/dL (ref 8.9–10.3)
CO2: 29 mmol/L (ref 22–32)
Chloride: 104 mmol/L (ref 101–111)
Creatinine, Ser: <0.3 mg/dL — ABNORMAL LOW (ref 0.44–1.00)
GLUCOSE: 101 mg/dL — AB (ref 65–99)
Potassium: 3.9 mmol/L (ref 3.5–5.1)
SODIUM: 139 mmol/L (ref 135–145)

## 2017-05-03 LAB — URINALYSIS, COMPLETE (UACMP) WITH MICROSCOPIC
Bilirubin Urine: NEGATIVE
GLUCOSE, UA: NEGATIVE mg/dL
Ketones, ur: NEGATIVE mg/dL
NITRITE: POSITIVE — AB
PROTEIN: NEGATIVE mg/dL
Specific Gravity, Urine: 1.025 (ref 1.005–1.030)
pH: 5 (ref 5.0–8.0)

## 2017-05-03 LAB — CBC
HCT: 39.1 % (ref 35.0–47.0)
HEMOGLOBIN: 12.8 g/dL (ref 12.0–16.0)
MCH: 27.6 pg (ref 26.0–34.0)
MCHC: 32.8 g/dL (ref 32.0–36.0)
MCV: 83.9 fL (ref 80.0–100.0)
Platelets: 277 10*3/uL (ref 150–440)
RBC: 4.66 MIL/uL (ref 3.80–5.20)
RDW: 16 % — AB (ref 11.5–14.5)
WBC: 7.3 10*3/uL (ref 3.6–11.0)

## 2017-05-03 MED ORDER — OXYCODONE HCL 5 MG PO TABS
10.0000 mg | ORAL_TABLET | Freq: Once | ORAL | Status: AC
  Administered 2017-05-03: 10 mg via ORAL
  Filled 2017-05-03: qty 2

## 2017-05-03 MED ORDER — CEFTRIAXONE SODIUM 1 G IJ SOLR
1.0000 g | Freq: Once | INTRAMUSCULAR | Status: AC
  Administered 2017-05-03: 1 g via INTRAMUSCULAR
  Filled 2017-05-03: qty 10

## 2017-05-03 MED ORDER — LIDOCAINE HCL (PF) 1 % IJ SOLN
INTRAMUSCULAR | Status: AC
  Filled 2017-05-03: qty 5

## 2017-05-03 MED ORDER — CEFTRIAXONE SODIUM 1 G IJ SOLR
INTRAMUSCULAR | Status: AC
  Filled 2017-05-03: qty 10

## 2017-05-03 MED ORDER — CEPHALEXIN 500 MG PO CAPS: 500.0000 mg | ORAL_CAPSULE | Freq: Four times a day (QID) | ORAL | 0 refills | Status: AC

## 2017-05-03 MED ORDER — DEXTROSE 5 % IV SOLN
1.0000 g | Freq: Once | INTRAVENOUS | Status: DC
  Filled 2017-05-03: qty 10

## 2017-05-03 NOTE — ED Notes (Signed)
Pt discharged home after verbalizing understanding of discharge instructions; nad noted. 

## 2017-05-03 NOTE — ED Provider Notes (Signed)
[facility] Emergency Department Provider Note  ____________________________________________  Time seen: Approximately 5:34 PM  I have reviewed the triage vital signs and the nursing notes.   HISTORY  Chief Complaint Flank Pain   HPI Lindsey [name] is a 56 y.o. female with a history of CAD, obstructive sleep apnea, diabetes, COPD, hypertension, hyperlipidemia who presents for evaluation of acute right flank pain and dysuria. Patient reports that she went to her PCP 4 weeks ago complaining of right-sided flank pain. Her UA was positive for blood and she was started on Flomax. She went back a week later complaining of continuous pain and received a second round of Flomax and some antibiotics. Both finished 2 weeks ago and she continues to have persistent pain. Patient reports dull constant moderate pain located in her right flank. She has had mild pain in the L flank as well. The pain is associated with dysuria which she has had for 2 weeks. No nausea or vomiting. No fever or chills. No abdominal pain or diarrhea. Patient reports that she has had one kidney stone in the past but has never undergone imaging for this current presentation.  Past Medical History:  Diagnosis Date  . Allergy   . Anemia   . Arthritis    knees  . COPD (chronic obstructive pulmonary disease) ([place])   . Diabetes mellitus without complication (HCC)    diet controlled  . GERD (gastroesophageal reflux disease)   . Heart attack ([place]) 2002  . Hyperlipidemia   . Hypertension   . PUD (peptic ulcer disease)   . Sleep apnea    can't tolerate CPAP.  Uses O2 only  . Spinal cord stimulator status    for lower back pain  . Stroke [facility]) 2004   "mini-stroke"  . Vertigo   . Wears dentures    full upper and lower    Patient Active Problem List   Diagnosis Date Noted  . Symptomatic anemia 12/24/2016  . Chest pain 12/23/2016  . Dyspnea 12/23/2016  . Palpitations 12/23/2016  . Hypotension  09/29/2016  . Renal insufficiency 09/29/2016  . Anemia 09/29/2016  . Leukocytosis 09/29/2016  . Prediabetes 09/29/2016  . Jejunal inflammation 09/29/2016  . Abnormal findings on esophagogastroduodenoscopy (EGD) 09/29/2016  . Vitamin D deficiency 09/29/2016  . Syncope 09/27/2016  . Medication overuse headache 11/08/2015  . Morbid obesity with BMI of 45.0-49.9, adult ([place]) 11/08/2015  . Seizures ([place]) 09/02/2015  . Lipodystrophy 10/13/2014  . 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 Disease of female genital organs 04/01/2013  . Difficult or painful urination 03/18/2013  . Urge incontinence 03/18/2013  . Urgency of micturation 03/18/2013    Past Surgical History:  Procedure Laterality Date  . ABDOMINAL HYSTERECTOMY    . back surgery    . broken wrist    . COLONOSCOPY WITH PROPOFOL N/A 01/30/2017   Procedure: COLONOSCOPY WITH PROPOFOL;  Surgeon: Jonathon Bellows, MD;  Location: [facility] ENDOSCOPY;  Service: Endoscopy;  Laterality: N/A;  . COLONOSCOPY WITH PROPOFOL N/A 02/20/2017   Procedure: Colonoscopy with propofol ;  Surgeon: Jonathon Bellows, MD;  Location: [facility] ENDOSCOPY;  Service: Endoscopy;  Laterality: N/A;  . COLONOSCOPY WITH PROPOFOL N/A 03/13/2017   Procedure: COLONOSCOPY WITH PROPOFOL;  Surgeon: Jonathon Bellows, MD;  Location: [facility] ENDOSCOPY;  Service: Endoscopy;  Laterality: N/A;  . COLONOSCOPY WITH PROPOFOL N/A 04/17/2017   Procedure: COLONOSCOPY WITH PROPOFOL;  Surgeon: Jonathon Bellows, MD;  Location: [facility] ENDOSCOPY;  Service: Endoscopy;  Laterality: N/A;  .  ESOPHAGOGASTRODUODENOSCOPY (EGD) WITH PROPOFOL N/A 09/28/2016   Procedure: ESOPHAGOGASTRODUODENOSCOPY (EGD) WITH PROPOFOL;  Surgeon: Manya Silvas, MD;  Location: [facility] ENDOSCOPY;  Service: Endoscopy;  Laterality: N/A;  . ESOPHAGOGASTRODUODENOSCOPY (EGD) WITH PROPOFOL N/A 03/13/2017   Procedure: ESOPHAGOGASTRODUODENOSCOPY (EGD) WITH PROPOFOL;  Surgeon: Jonathon Bellows, MD;  Location: [facility] ENDOSCOPY;  Service:  Endoscopy;  Laterality: N/A;  . GALLBLADDER SURGERY    . GASTRIC BYPASS    . GIVENS CAPSULE STUDY N/A 03/13/2017   Procedure: GIVENS CAPSULE STUDY;  Surgeon: Jonathon Bellows, MD;  Location: [facility] ENDOSCOPY;  Service: Endoscopy;  Laterality: N/A;  . HERNIA REPAIR    . JOINT REPLACEMENT  1995  . REPLACEMENT TOTAL KNEE    . ROTATOR CUFF REPAIR      Prior to Admission medications   Medication Sig Start Date End Date Taking? Authorizing Provider  albuterol (PROVENTIL HFA;VENTOLIN HFA) 108 (90 BASE) MCG/ACT inhaler Inhale 2 puffs into the lungs every 4 (four) hours as needed for wheezing or shortness of breath.    [provider]  albuterol (PROVENTIL) (2.5 MG/3ML) 0.083% nebulizer solution Take 2.5 mg by nebulization every 6 (six) hours as needed for wheezing or shortness of breath.    [provider]  Alendronate Sodium (FOSAMAX PO) Take by mouth.    [provider]  ALPRAZolam Duanne Moron) 0.25 MG tablet  02/02/17   [provider]  ALPRAZolam Duanne Moron) 1 MG tablet Take 1-1.5 mg by mouth 2 (two) times daily. Pt takes one tablet in the morning and one and one-half tablet at night.    [provider]  amoxicillin (AMOXIL) 500 MG capsule take 1 capsule by mouth three times a day 01/23/17   [provider]  baclofen (LIORESAL) 10 MG tablet Take 10 mg by mouth 3 (three) times daily.    [provider]  Calcium Carbonate-Vitamin D (OYSTER SHELL CALCIUM 500 + D PO)  12/26/16   [provider]  Calcium Lactate 648 MG TABS Take 648 mg by mouth 2 (two) times daily.    [provider]  cephALEXin (KEFLEX) 500 MG capsule Take 1 capsule (500 mg total) by mouth 4 (four) times daily. 05/03/17 05/13/17  Rudene Re, MD  cloNIDine (CATAPRES) 0.1 MG tablet Take 0.1 mg by mouth at bedtime.     [provider]  desvenlafaxine (PRISTIQ) 100 MG 24 hr tablet Take 100 mg by mouth daily. Pt takes 100 mg tab and 50 mg tab for total of 150 mg     [provider]  desvenlafaxine (PRISTIQ) 50 MG 24 hr tablet Take 50 mg by mouth daily. Pt takes 100 mg tab and 50 mg tab for total of 150 mg    [provider]  Desvenlafaxine ER (PRISTIQ) 50 MG TB24  03/15/17   [provider]  Desvenlafaxine ER 100 MG TB24  03/15/17   [provider]  diazepam (VALIUM) 5 MG tablet To be taken 3 times daily with Antivert for vertigo as needed 01/23/17   Earleen Newport, MD  esomeprazole (NEXIUM) 40 MG capsule Take 40 mg by mouth 2 (two) times daily.    [provider]  fentaNYL (DURAGESIC - DOSED MCG/HR) 50 MCG/HR Place 50 mcg onto the skin every 3 (three) days.    [provider]  ferrous sulfate 325 (65 FE) MG tablet Take 1 tablet (325 mg total) by mouth 2 (two) times daily with a meal. 12/26/16   Hillary Bow, MD  fexofenadine (ALLEGRA) 180 MG tablet  01/25/17  [provider]  fluticasone (FLONASE) 50 MCG/ACT nasal spray Place 1 spray into both nostrils 2 (two) times daily.    [provider]  folic acid (FOLVITE) 1 MG tablet Take 1 mg by mouth daily.    [provider]  hydrOXYzine (ATARAX/VISTARIL) 25 MG tablet Take 25 mg by mouth 3 (three) times daily.    [provider]  l-methylfolate-B6-B12 (METANX) 3-35-2 MG TABS tablet Take 1 tablet by mouth 2 (two) times daily.    [provider]  meclizine (ANTIVERT) 25 MG tablet Take 1 tablet (25 mg total) by mouth 3 (three) times daily as needed for dizziness or nausea. 01/23/17   Earleen Newport, MD  metoprolol succinate (TOPROL-XL) 100 MG 24 hr tablet Take 100 mg by mouth daily. Take with or immediately following a meal.    [provider]  mirtazapine (REMERON) 45 MG tablet Take 45 mg by mouth at bedtime.    [provider]  nitroGLYCERIN (NITROSTAT) 0.3 MG SL tablet Place under the tongue.    [provider]  olmesartan-hydrochlorothiazide (BENICAR HCT) 40-12.5 MG tablet Take 1  tablet by mouth daily.    [provider]  ONE TOUCH ULTRA TEST test strip  01/25/17   [provider]  ONGLYZA 5 MG TABS tablet Take 1 tablet by mouth daily. 08/28/16   [provider]  oxybutynin (DITROPAN) 5 MG tablet Take 5 mg by mouth 2 (two) times daily.    [provider]  oxyCODONE (OXY IR/ROXICODONE) 5 MG immediate release tablet Take 5 mg by mouth every 6 (six) hours as needed for severe pain.    [provider]  Phendimetrazine Tartrate 35 MG TABS  03/22/17   [provider]  potassium chloride (K-DUR) 10 MEQ tablet Take 10 mEq by mouth daily.    [provider]  simvastatin (ZOCOR) 40 MG tablet Take 1 tablet by mouth daily. 09/24/16   [provider]  sucralfate (CARAFATE) 1 g tablet  02/21/17   [provider]  [place] KIT 17.5-3.13-1.6 GM/180ML SOLN  02/07/17   [provider]  tamsulosin (FLOMAX) 0.4 MG CAPS capsule Take 0.4 mg by mouth daily. 04/09/17   [provider]  Vitamin D, Ergocalciferol, (DRISDOL) 50000 units CAPS capsule Take 1 capsule (50,000 Units total) by mouth every 7 (seven) days. 10/05/16   Theodoro Grist, MD  zolpidem (AMBIEN) 10 MG tablet Take 10 mg by mouth at bedtime as needed for sleep.    [provider]    Allergies Morphine; Iodinated diagnostic agents; Aspirin; Etodolac; Ibuprofen; Shellfish allergy; and Tylenol [acetaminophen]  Family History  Problem Relation Age of Onset  . Hypertension Father   . COPD Father   . Heart disease Father   . Breast cancer Neg Hx     Social History Social History  Substance Use Topics  . Smoking status: Never Smoker  . Smokeless tobacco: Never Used  . Alcohol use No    Review of Systems  Constitutional: Negative for fever. Eyes: Negative for visual changes. ENT: Negative for sore throat. Neck: No neck pain  Cardiovascular: Negative for chest pain. Respiratory: Negative for shortness of  breath. Gastrointestinal: Negative for abdominal pain, vomiting or diarrhea. Genitourinary: + dysuria and R flank pain Musculoskeletal: Negative for back pain. Skin: Negative for rash. Neurological: Negative for headaches, weakness or numbness. Psych: No SI or HI  ____________________________________________   PHYSICAL EXAM:  VITAL SIGNS: ED Triage Vitals  Enc Vitals Group  BP 05/03/17 1448 114/66     Pulse Rate 05/03/17 1448 65     Resp 05/03/17 1448 16     Temp 05/03/17 1448 97.6 F (36.4 C)     Temp Source 05/03/17 1448 Oral     SpO2 05/03/17 1448 98 %     Weight 05/03/17 1446 295 lb (133.8 kg)     Height 05/03/17 1446 '5\' 7"'$  (1.702 m)     Head Circumference --      Peak Flow --      Pain Score --      Pain Loc --      Pain Edu? --      Excl. in [place]? --     Constitutional: Alert and oriented. Well appearing and in no apparent distress. HEENT:      Head: Normocephalic and atraumatic.         Eyes: Conjunctivae are normal. Sclera is non-icteric.       Mouth/Throat: Mucous membranes are moist.       Neck: Supple with no signs of meningismus. Cardiovascular: Regular rate and rhythm. No murmurs, gallops, or rubs. 2+ symmetrical distal pulses are present in all extremities. No JVD. Respiratory: Normal respiratory effort. Lungs are clear to auscultation bilaterally. No wheezes, crackles, or rhonchi.  Gastrointestinal: Soft, ttp over the suprapubic region, and non distended with positive bowel sounds. No rebound or guarding. Genitourinary: R CVA tenderness. Musculoskeletal: Nontender with normal range of motion in all extremities. No edema, cyanosis, or erythema of extremities. Neurologic: Normal speech and language. Face is symmetric. Moving all extremities. No gross focal neurologic deficits are appreciated. Skin: Skin is warm, dry and intact. No rash noted. Psychiatric: Mood and affect are normal. Speech and behavior are  normal.  ____________________________________________   LABS (all labs ordered are listed, but only abnormal results are displayed)  Labs Reviewed  URINALYSIS, COMPLETE (UACMP) WITH MICROSCOPIC - Abnormal; Notable for the following:       Result Value   Color, Urine YELLOW (*)    APPearance HAZY (*)    Hgb urine dipstick SMALL (*)    Nitrite POSITIVE (*)    Leukocytes, UA MODERATE (*)    Bacteria, UA RARE (*)    Squamous Epithelial / LPF 0-5 (*)    All other components within normal limits  BASIC METABOLIC PANEL - Abnormal; Notable for the following:    Glucose, Bld 101 (*)    Creatinine, Ser <0.30 (*)    All other components within normal limits  CBC - Abnormal; Notable for the following:    RDW 16.0 (*)    All other components within normal limits  URINE CULTURE   ____________________________________________  EKG  none  ____________________________________________  RADIOLOGY  CT renal: 1. Stable benign-appearing bilateral adrenal nodules consistent with adenomas, measuring 2 cm on the right and 1.5 cm on the left. 2. Status post bariatric surgery without complications. 3. No obstructive uropathy or nephrolithiasis. 4. Status post cholecystectomy. Status post hysterectomy. 5. Neural stimulator lead up to the T8 level within the spinal canal. 6. Posterior spinal fusion at L5-S1. Thoracolumbar spondylosis. ____________________________________________   PROCEDURES  Procedure(s) performed: None Procedures Critical Care performed:  None ____________________________________________   INITIAL IMPRESSION / ASSESSMENT AND PLAN / ED COURSE  56 y.o. female with a history of CAD, obstructive sleep apnea, diabetes, COPD, hypertension, hyperlipidemia who presents for evaluation of right flank pain x 1 month and dysuria x 1 week. Patient is well-appearing, in no distress, has normal vital signs,  she is tender on the right flank and suprapubic region with no rebound or  guarding. Patient's UA is positive for urinary tract infection. Since patient has not undergone any imaging and the symptoms have been going on for a month I will send her for CT renal to evaluate for stone. In the meantime we'll give her a dose of Rocephin.  Clinical Course as of May 04 1803  Thu May 03, 2017  1801 CT with no evidence of kidney stone. Presentation concerning for pyelonephritis. Patient received rocephin in the ED and will be dc home on cephalexin for 10 days per ID recs. No evidence of sepsis.  [CV]    Clinical Course User Index [CV] Rudene Re, MD    Pertinent labs & imaging results that were available during my care of the patient were reviewed by me and considered in my medical decision making (see chart for details).    ____________________________________________   FINAL CLINICAL IMPRESSION(S) / ED DIAGNOSES  Final diagnoses:  Pyelonephritis      NEW MEDICATIONS STARTED DURING THIS VISIT:  New Prescriptions   CEPHALEXIN (KEFLEX) 500 MG CAPSULE    Take 1 capsule (500 mg total) by mouth 4 (four) times daily.     Note:  This document was prepared using Dragon voice recognition software and may include unintentional dictation errors.    Alfred Levins, [place], MD 05/03/17 [phone]

## 2017-05-03 NOTE — ED Triage Notes (Signed)
Pt comes into the Ed via POV c/o right sided flank pain.  Patient diagnosed with kidney stones and has completed two rounds of flomax with no success.  Patient still states there is blood in her urine and having difficulty urinating.  Patient in NAD at this time with even and unlabored respirations.  Denies any fevers at home.

## 2017-05-06 LAB — URINE CULTURE

## 2017-06-08 ENCOUNTER — Emergency Department: Payer: Medicare Other

## 2017-06-08 ENCOUNTER — Encounter: Payer: Self-pay | Admitting: Emergency Medicine

## 2017-06-08 ENCOUNTER — Inpatient Hospital Stay
Admission: EM | Admit: 2017-06-08 | Discharge: 2017-06-10 | DRG: 683 | Disposition: A | Discharge status: Home / Self Care | Payer: Medicare Other | Attending: Internal Medicine | Admitting: Internal Medicine

## 2017-06-08 DIAGNOSIS — N179 Acute kidney failure, unspecified: Secondary | ICD-10-CM | POA: Diagnosis not present

## 2017-06-08 DIAGNOSIS — E662 Morbid (severe) obesity with alveolar hypoventilation: Secondary | ICD-10-CM | POA: Diagnosis not present

## 2017-06-08 DIAGNOSIS — Z825 Family history of asthma and other chronic lower respiratory diseases: Secondary | ICD-10-CM

## 2017-06-08 DIAGNOSIS — E860 Dehydration: Secondary | ICD-10-CM | POA: Diagnosis present

## 2017-06-08 DIAGNOSIS — Z96652 Presence of left artificial knee joint: Secondary | ICD-10-CM | POA: Diagnosis present

## 2017-06-08 DIAGNOSIS — Z885 Allergy status to narcotic agent status: Secondary | ICD-10-CM | POA: Diagnosis not present

## 2017-06-08 DIAGNOSIS — Z91013 Allergy to seafood: Secondary | ICD-10-CM

## 2017-06-08 DIAGNOSIS — I252 Old myocardial infarction: Secondary | ICD-10-CM

## 2017-06-08 DIAGNOSIS — I129 Hypertensive chronic kidney disease with stage 1 through stage 4 chronic kidney disease, or unspecified chronic kidney disease: Secondary | ICD-10-CM | POA: Diagnosis present

## 2017-06-08 DIAGNOSIS — Z90710 Acquired absence of both cervix and uterus: Secondary | ICD-10-CM | POA: Diagnosis not present

## 2017-06-08 DIAGNOSIS — E559 Vitamin D deficiency, unspecified: Secondary | ICD-10-CM | POA: Diagnosis present

## 2017-06-08 DIAGNOSIS — E876 Hypokalemia: Secondary | ICD-10-CM | POA: Diagnosis present

## 2017-06-08 DIAGNOSIS — E1122 Type 2 diabetes mellitus with diabetic chronic kidney disease: Secondary | ICD-10-CM | POA: Diagnosis not present

## 2017-06-08 DIAGNOSIS — Z6841 Body Mass Index (BMI) 40.0 and over, adult: Secondary | ICD-10-CM

## 2017-06-08 DIAGNOSIS — J449 Chronic obstructive pulmonary disease, unspecified: Secondary | ICD-10-CM | POA: Diagnosis present

## 2017-06-08 DIAGNOSIS — Z886 Allergy status to analgesic agent status: Secondary | ICD-10-CM

## 2017-06-08 DIAGNOSIS — N189 Chronic kidney disease, unspecified: Secondary | ICD-10-CM | POA: Diagnosis present

## 2017-06-08 DIAGNOSIS — E785 Hyperlipidemia, unspecified: Secondary | ICD-10-CM | POA: Diagnosis not present

## 2017-06-08 DIAGNOSIS — F329 Major depressive disorder, single episode, unspecified: Secondary | ICD-10-CM | POA: Diagnosis present

## 2017-06-08 DIAGNOSIS — G894 Chronic pain syndrome: Secondary | ICD-10-CM | POA: Diagnosis present

## 2017-06-08 DIAGNOSIS — N184 Chronic kidney disease, stage 4 (severe): Secondary | ICD-10-CM | POA: Diagnosis not present

## 2017-06-08 DIAGNOSIS — K219 Gastro-esophageal reflux disease without esophagitis: Secondary | ICD-10-CM | POA: Diagnosis present

## 2017-06-08 DIAGNOSIS — M25561 Pain in right knee: Secondary | ICD-10-CM

## 2017-06-08 DIAGNOSIS — Z7951 Long term (current) use of inhaled steroids: Secondary | ICD-10-CM

## 2017-06-08 DIAGNOSIS — R569 Unspecified convulsions: Secondary | ICD-10-CM | POA: Diagnosis not present

## 2017-06-08 DIAGNOSIS — G8929 Other chronic pain: Secondary | ICD-10-CM

## 2017-06-08 DIAGNOSIS — M545 Low back pain: Secondary | ICD-10-CM | POA: Diagnosis not present

## 2017-06-08 DIAGNOSIS — M1990 Unspecified osteoarthritis, unspecified site: Secondary | ICD-10-CM

## 2017-06-08 DIAGNOSIS — T502X5A Adverse effect of carbonic-anhydrase inhibitors, benzothiadiazides and other diuretics, initial encounter: Secondary | ICD-10-CM | POA: Diagnosis present

## 2017-06-08 DIAGNOSIS — Z9884 Bariatric surgery status: Secondary | ICD-10-CM

## 2017-06-08 DIAGNOSIS — M1711 Unilateral primary osteoarthritis, right knee: Secondary | ICD-10-CM | POA: Diagnosis present

## 2017-06-08 DIAGNOSIS — Z8249 Family history of ischemic heart disease and other diseases of the circulatory system: Secondary | ICD-10-CM

## 2017-06-08 LAB — URINALYSIS, COMPLETE (UACMP) WITH MICROSCOPIC
Bacteria, UA: NONE SEEN
Bilirubin Urine: NEGATIVE
Glucose, UA: NEGATIVE mg/dL
Hgb urine dipstick: NEGATIVE
KETONES UR: NEGATIVE mg/dL
Leukocytes, UA: NEGATIVE
NITRITE: NEGATIVE
PH: 5 (ref 5.0–8.0)
Protein, ur: NEGATIVE mg/dL
SPECIFIC GRAVITY, URINE: 1.013 (ref 1.005–1.030)

## 2017-06-08 LAB — HEPATIC FUNCTION PANEL
ALBUMIN: 3.8 g/dL (ref 3.5–5.0)
ALK PHOS: 113 U/L (ref 38–126)
ALT: 16 U/L (ref 14–54)
AST: 21 U/L (ref 15–41)
Bilirubin, Direct: 0.1 mg/dL (ref 0.1–0.5)
Indirect Bilirubin: 0.6 mg/dL (ref 0.3–0.9)
TOTAL PROTEIN: 7.5 g/dL (ref 6.5–8.1)
Total Bilirubin: 0.7 mg/dL (ref 0.3–1.2)

## 2017-06-08 LAB — CBC
HCT: 37.2 % (ref 35.0–47.0)
Hemoglobin: 12.4 g/dL (ref 12.0–16.0)
MCH: 28 pg (ref 26.0–34.0)
MCHC: 33.2 g/dL (ref 32.0–36.0)
MCV: 84.3 fL (ref 80.0–100.0)
PLATELETS: 331 10*3/uL (ref 150–440)
RBC: 4.42 MIL/uL (ref 3.80–5.20)
RDW: 15.7 % — ABNORMAL HIGH (ref 11.5–14.5)
WBC: 9.7 10*3/uL (ref 3.6–11.0)

## 2017-06-08 LAB — BASIC METABOLIC PANEL
Anion gap: 9 (ref 5–15)
BUN: 45 mg/dL — ABNORMAL HIGH (ref 6–20)
CALCIUM: 9.4 mg/dL (ref 8.9–10.3)
CO2: 23 mmol/L (ref 22–32)
CREATININE: 2.33 mg/dL — AB (ref 0.44–1.00)
Chloride: 106 mmol/L (ref 101–111)
GFR calc non Af Amer: 22 mL/min — ABNORMAL LOW (ref >60)
GFR, EST AFRICAN AMERICAN: 25 mL/min — AB (ref >60)
GLUCOSE: 130 mg/dL — AB (ref 65–99)
Potassium: 3.7 mmol/L (ref 3.5–5.1)
Sodium: 138 mmol/L (ref 135–145)

## 2017-06-08 LAB — TROPONIN I: Troponin I: <0.03 ng/mL (ref <0.03)

## 2017-06-08 LAB — LIPASE, BLOOD: LIPASE: 23 U/L (ref 11–51)

## 2017-06-08 MED ORDER — SODIUM CHLORIDE 0.9 % IV BOLUS (SEPSIS)
1000.0000 mL | Freq: Once | INTRAVENOUS | Status: AC
  Administered 2017-06-08: 1000 mL via INTRAVENOUS

## 2017-06-08 MED ORDER — OXYCODONE HCL 5 MG PO TABS
5.0000 mg | ORAL_TABLET | Freq: Four times a day (QID) | ORAL | Status: DC | PRN
  Administered 2017-06-08 – 2017-06-09 (×2): 5 mg via ORAL
  Filled 2017-06-08 (×4): qty 1

## 2017-06-08 MED ORDER — OXYCODONE-ACETAMINOPHEN 5-325 MG PO TABS
1.0000 | ORAL_TABLET | Freq: Once | ORAL | Status: DC
  Filled 2017-06-08: qty 1

## 2017-06-08 NOTE — ED Notes (Signed)
Po fluids and ice chips provided.

## 2017-06-08 NOTE — ED Notes (Signed)
Per dr. Kerman Passey, obtain BMP after 2l ns infused.

## 2017-06-08 NOTE — ED Provider Notes (Signed)
Clinical Course as of Jun 10 251  Fri Jun 08, 2017  2007 Creatinine: (!) 2.33 [KP]  2315 Assuming care from Dr. Kerman Passey.  In short, Lindsey [name] is a 56 y.o. female with a chief complaint of dehydration.  Refer to the original H&P for additional details.  The current plan of care is to follow up repeat BMP and reassess.  Plan currently is for discharge if creatinine is same or improved.  Will discuss with patient.   [CF]  Sat Jun 09, 2017  0159 BUN and creatinine essentially unchanged after 2L NS and PO fluids.  GFR down in the 20s.  Discussed with patient my recommendation that she be admitted at this point, but explained we cannot guarantee inpatient vs observation status.  She understands and agrees to admission.    Her IV infiltrated and she has some swelling in her right upper arm with firmness to palpation but no erythema and easily compressible compartments.  Recommended elevation.  Patient will need IV placement team for ongoing hydration.  [CF]    Clinical Course User Index [CF] Hinda Kehr, MD [KP] Harvest Dark, MD      Hinda Kehr, MD 06/09/17 [phone]

## 2017-06-08 NOTE — ED Triage Notes (Signed)
Pt in via POV; sent over from PCP.  Pt seeing PCP today due to increasing right knee pain x 4 days.  PCP sent pt here due to decreased kidney function and BP 80/60.  Pt reports generalized weakness, and feeling dehydrated.  Vitals WDL upon arrival, NAD noted at this time.

## 2017-06-08 NOTE — ED Notes (Signed)
Pt provided with bag for clothing, 4 warm blankets and 2 gowns per request. Pt encouraged to change for MD. Pt verbalizes understanding. Call bell at right side.

## 2017-06-08 NOTE — ED Notes (Signed)
Checked pt again while sleeping for possible infiltration to iv site. Site is slightly swollen and cool to touch, no blood return noted. Iv pulled. Pt awoken by rn and informed of situation. Call placed to lab to notify of need for venipuncture assistance.

## 2017-06-08 NOTE — ED Notes (Signed)
Pt unable to urinate at this time.  

## 2017-06-08 NOTE — ED Notes (Signed)
IVF continue to infuse about 858mL left to infuse. No s/s of infiltration to iv site.

## 2017-06-08 NOTE — ED Notes (Signed)
Pt resting with eyes closed. rn awoke pt on entering room. Pt states no pain relief from oxy IR. Pt requesting fluids. Will ask md if ok. Call bell moved to left side. ivf infusing without difficulty. Pt complains of pain to iv site but no s/s of infiltration noted.

## 2017-06-08 NOTE — ED Notes (Signed)
Pt sleeping, 498mL of ns left in iv bags. No s/s of infiltration noted to iv site.

## 2017-06-08 NOTE — ED Notes (Signed)
Pt states she is here for admission for dehydration. Pt states has had right knee pain for 4 days, no swelling noted. Pt with normal color warm and dry skin. Pt states "my doctor told me my kidneys are bad and I need admitted." pt denies other symptoms. resps unlabored. 2+ radial pulse noted.

## 2017-06-08 NOTE — ED Provider Notes (Signed)
[facility] Emergency Department Provider Note  Time seen: 8:22 PM  I have reviewed the triage vital signs and the nursing notes.   HISTORY  Chief Complaint Dehydration    HPI Lindsey [name] is a 56 y.o. female with a past medical history of chronic back pain, COPD, diabetes, hypertension, hyperlipidemia, presents to the emergency department for dehydration. According to the patient over the past for 5 days she has felt somewhat lightheaded and weak. States her mouth has been very dry and she felt dehydrated so she saw her doctor. States she has been drinking lots of fluids and Gatorade at home, did not feel any better so she went back to her doctor today who did blood work showing her creatinine was elevated and sent her to the emergency department for further evaluation. As a secondary complaint the patient also states over the past for 5 days her knee has been hurting and clicking at times when she walks. Denies any known trauma. Patient denies any chest pain or trouble breathing, abdominal pain, states nausea at times but denies vomiting or diarrhea. Denies dysuria.  Past Medical History:  Diagnosis Date  . Allergy   . Anemia   . Arthritis    knees  . COPD (chronic obstructive pulmonary disease) ([place])   . Diabetes mellitus without complication (HCC)    diet controlled  . GERD (gastroesophageal reflux disease)   . Heart attack ([place]) 2002  . Hyperlipidemia   . Hypertension   . PUD (peptic ulcer disease)   . Sleep apnea    can't tolerate CPAP.  Uses O2 only  . Spinal cord stimulator status    for lower back pain  . Stroke [facility]) 2004   "mini-stroke"  . Vertigo   . Wears dentures    full upper and lower    Patient Active Problem List   Diagnosis Date Noted  . Symptomatic anemia 12/24/2016  . Chest pain 12/23/2016  . Dyspnea 12/23/2016  . Palpitations 12/23/2016  . Hypotension 09/29/2016  . Renal insufficiency 09/29/2016  . Anemia 09/29/2016  .  Leukocytosis 09/29/2016  . Prediabetes 09/29/2016  . Jejunal inflammation 09/29/2016  . Abnormal findings on esophagogastroduodenoscopy (EGD) 09/29/2016  . Vitamin D deficiency 09/29/2016  . Syncope 09/27/2016  . Medication overuse headache 11/08/2015  . Morbid obesity with BMI of 45.0-49.9, adult ([place]) 11/08/2015  . Seizures ([place]) 09/02/2015  . Lipodystrophy 10/13/2014  . 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 Disease of female genital organs 04/01/2013  . Difficult or painful urination 03/18/2013  . Urge incontinence 03/18/2013  . Urgency of micturation 03/18/2013    Past Surgical History:  Procedure Laterality Date  . ABDOMINAL HYSTERECTOMY    . back surgery    . broken wrist    . COLONOSCOPY WITH PROPOFOL N/A 01/30/2017   Procedure: COLONOSCOPY WITH PROPOFOL;  Surgeon: Jonathon Bellows, MD;  Location: [facility] ENDOSCOPY;  Service: Endoscopy;  Laterality: N/A;  . COLONOSCOPY WITH PROPOFOL N/A 02/20/2017   Procedure: Colonoscopy with propofol ;  Surgeon: Jonathon Bellows, MD;  Location: [facility] ENDOSCOPY;  Service: Endoscopy;  Laterality: N/A;  . COLONOSCOPY WITH PROPOFOL N/A 03/13/2017   Procedure: COLONOSCOPY WITH PROPOFOL;  Surgeon: Jonathon Bellows, MD;  Location: [facility] ENDOSCOPY;  Service: Endoscopy;  Laterality: N/A;  . COLONOSCOPY WITH PROPOFOL N/A 04/17/2017   Procedure: COLONOSCOPY WITH PROPOFOL;  Surgeon: Jonathon Bellows, MD;  Location: [facility] ENDOSCOPY;  Service: Endoscopy;  Laterality: N/A;  . ESOPHAGOGASTRODUODENOSCOPY (EGD) WITH PROPOFOL N/A 09/28/2016  Procedure: ESOPHAGOGASTRODUODENOSCOPY (EGD) WITH PROPOFOL;  Surgeon: Manya Silvas, MD;  Location: [facility] ENDOSCOPY;  Service: Endoscopy;  Laterality: N/A;  . ESOPHAGOGASTRODUODENOSCOPY (EGD) WITH PROPOFOL N/A 03/13/2017   Procedure: ESOPHAGOGASTRODUODENOSCOPY (EGD) WITH PROPOFOL;  Surgeon: Jonathon Bellows, MD;  Location: [facility] ENDOSCOPY;  Service: Endoscopy;  Laterality: N/A;  . GALLBLADDER SURGERY    . GASTRIC BYPASS     . GIVENS CAPSULE STUDY N/A 03/13/2017   Procedure: GIVENS CAPSULE STUDY;  Surgeon: Jonathon Bellows, MD;  Location: [facility] ENDOSCOPY;  Service: Endoscopy;  Laterality: N/A;  . HERNIA REPAIR    . JOINT REPLACEMENT  1995  . REPLACEMENT TOTAL KNEE    . ROTATOR CUFF REPAIR      Prior to Admission medications   Medication Sig Start Date End Date Taking? Authorizing Provider  albuterol (PROVENTIL HFA;VENTOLIN HFA) 108 (90 BASE) MCG/ACT inhaler Inhale 2 puffs into the lungs every 4 (four) hours as needed for wheezing or shortness of breath.    [provider]  albuterol (PROVENTIL) (2.5 MG/3ML) 0.083% nebulizer solution Take 2.5 mg by nebulization every 6 (six) hours as needed for wheezing or shortness of breath.    [provider]  Alendronate Sodium (FOSAMAX PO) Take by mouth.    [provider]  ALPRAZolam Duanne Moron) 0.25 MG tablet  02/02/17   [provider]  ALPRAZolam Duanne Moron) 1 MG tablet Take 1-1.5 mg by mouth 2 (two) times daily. Pt takes one tablet in the morning and one and one-half tablet at night.    [provider]  amoxicillin (AMOXIL) 500 MG capsule take 1 capsule by mouth three times a day 01/23/17   [provider]  baclofen (LIORESAL) 10 MG tablet Take 10 mg by mouth 3 (three) times daily.    [provider]  Calcium Carbonate-Vitamin D (OYSTER SHELL CALCIUM 500 + D PO)  12/26/16   [provider]  Calcium Lactate 648 MG TABS Take 648 mg by mouth 2 (two) times daily.    [provider]  cloNIDine (CATAPRES) 0.1 MG tablet Take 0.1 mg by mouth at bedtime.     [provider]  desvenlafaxine (PRISTIQ) 100 MG 24 hr tablet Take 100 mg by mouth daily. Pt takes 100 mg tab and 50 mg tab for total of 150 mg    [provider]  desvenlafaxine (PRISTIQ) 50 MG 24 hr tablet Take 50 mg by mouth daily. Pt takes 100 mg tab and 50 mg tab for total of 150 mg    [provider]  Desvenlafaxine ER (PRISTIQ) 50  MG TB24  03/15/17   [provider]  Desvenlafaxine ER 100 MG TB24  03/15/17   [provider]  diazepam (VALIUM) 5 MG tablet To be taken 3 times daily with Antivert for vertigo as needed 01/23/17   Earleen Newport, MD  esomeprazole (NEXIUM) 40 MG capsule Take 40 mg by mouth 2 (two) times daily.    [provider]  fentaNYL (DURAGESIC - DOSED MCG/HR) 50 MCG/HR Place 50 mcg onto the skin every 3 (three) days.    [provider]  ferrous sulfate 325 (65 FE) MG tablet Take 1 tablet (325 mg total) by mouth 2 (two) times daily with a meal. 12/26/16   Hillary Bow, MD  fexofenadine (ALLEGRA) 180 MG tablet  01/25/17   [provider]  fluticasone (FLONASE) 50 MCG/ACT nasal spray Place 1 spray into both nostrils 2 (two) times daily.    [provider]  folic acid (FOLVITE) 1  MG tablet Take 1 mg by mouth daily.    [provider]  hydrOXYzine (ATARAX/VISTARIL) 25 MG tablet Take 25 mg by mouth 3 (three) times daily.    [provider]  l-methylfolate-B6-B12 (METANX) 3-35-2 MG TABS tablet Take 1 tablet by mouth 2 (two) times daily.    [provider]  meclizine (ANTIVERT) 25 MG tablet Take 1 tablet (25 mg total) by mouth 3 (three) times daily as needed for dizziness or nausea. 01/23/17   Earleen Newport, MD  metoprolol succinate (TOPROL-XL) 100 MG 24 hr tablet Take 100 mg by mouth daily. Take with or immediately following a meal.    [provider]  mirtazapine (REMERON) 45 MG tablet Take 45 mg by mouth at bedtime.    [provider]  nitroGLYCERIN (NITROSTAT) 0.3 MG SL tablet Place under the tongue.    [provider]  olmesartan-hydrochlorothiazide (BENICAR HCT) 40-12.5 MG tablet Take 1 tablet by mouth daily.    [provider]  ONE TOUCH ULTRA TEST test strip  01/25/17   [provider]  ONGLYZA 5 MG TABS tablet Take 1 tablet by mouth daily. 08/28/16   [provider]   oxybutynin (DITROPAN) 5 MG tablet Take 5 mg by mouth 2 (two) times daily.    [provider]  oxyCODONE (OXY IR/ROXICODONE) 5 MG immediate release tablet Take 5 mg by mouth every 6 (six) hours as needed for severe pain.    [provider]  Phendimetrazine Tartrate 35 MG TABS  03/22/17   [provider]  potassium chloride (K-DUR) 10 MEQ tablet Take 10 mEq by mouth daily.    [provider]  simvastatin (ZOCOR) 40 MG tablet Take 1 tablet by mouth daily. 09/24/16   [provider]  sucralfate (CARAFATE) 1 g tablet  02/21/17   [provider]  [place] KIT 17.5-3.13-1.6 GM/180ML SOLN  02/07/17   [provider]  tamsulosin (FLOMAX) 0.4 MG CAPS capsule Take 0.4 mg by mouth daily. 04/09/17   [provider]  Vitamin D, Ergocalciferol, (DRISDOL) 50000 units CAPS capsule Take 1 capsule (50,000 Units total) by mouth every 7 (seven) days. 10/05/16   Theodoro Grist, MD  zolpidem (AMBIEN) 10 MG tablet Take 10 mg by mouth at bedtime as needed for sleep.    [provider]    Allergies  Allergen Reactions  . Morphine Hives  . Iodinated Diagnostic Agents Hives  . Aspirin Hives    Noted on MD progress notes and discussed with MD 09/02/15  . Etodolac Hives  . Ibuprofen Hives  . Shellfish Allergy Hives  . Tylenol [Acetaminophen] Hives    Upset stomach     Family History  Problem Relation Age of Onset  . Hypertension Father   . COPD Father   . Heart disease Father   . Breast cancer Neg Hx     Social History Social History  Substance Use Topics  . Smoking status: Never Smoker  . Smokeless tobacco: Never Used  . Alcohol use No    Review of Systems Constitutional: Negative for fever. Cardiovascular: Negative for chest pain. Respiratory: Negative for shortness of breath. Gastrointestinal: Negative for abdominal pain, vomiting and diarrhea. Genitourinary: Negative for dysuria. Musculoskeletal: Right knee  pain 4 days. Chronic back pain. Neurological: Negative for headache All other ROS negative  ____________________________________________   PHYSICAL EXAM:  VITAL SIGNS: ED Triage Vitals  Enc Vitals Group     BP 06/08/17 1728 115/69  Pulse Rate 06/08/17 1728 72     Resp 06/08/17 1728 16     Temp 06/08/17 1728 98.8 F (37.1 C)     Temp Source 06/08/17 1728 Oral     SpO2 06/08/17 1728 97 %     Weight 06/08/17 1729 295 lb (133.8 kg)     Height 06/08/17 1729 '5\' 7"'$  (1.702 m)     Head Circumference --      Peak Flow --      Pain Score 06/08/17 1728 10     Pain Loc --      Pain Edu? --      Excl. in [place]? --     Constitutional: Alert and oriented. Well appearing and in no distress. Eyes: Normal exam ENT   Head: Normocephalic and atraumatic.   Mouth/Throat: Mucous membranes are moist. Cardiovascular: Normal rate, regular rhythm. Respiratory: Normal respiratory effort without tachypnea nor retractions. Breath sounds are clear Gastrointestinal: Soft and nontender. No distention Musculoskeletal: Moderate tenderness of the right knee, mostly anteriorly. States some pain with range of motion. Neurologic:  Normal speech and language. No gross focal neurologic deficits Skin:  Skin is warm, dry and intact.  Psychiatric: Mood and affect are normal.   ____________________________________________    EKG  EKG reviewed and interpreted by myself shows sinus bradycardia at 58 bpm, narrow QRS, normal axis, normal intervals, no concerning ST changes.  ____________________________________________    RADIOLOGY  X-ray most consistent with arthritis of the right knee  ____________________________________________   INITIAL IMPRESSION / ASSESSMENT AND PLAN / ED COURSE  Pertinent labs & imaging results that were available during my care of the patient were reviewed by me and considered in my medical decision making (see chart for details).  Patient presents the emergency  department for dehydration with an elevated BUNs/creatinine. Patient's creatinine is 2.3 in the emergency department today. Patient's baseline creatinine appears to be around 1.3. Patient states some weakness feelings of dry mouth and dehydration. She is also planning of right knee pain with some tenderness and some pain with range of motion. We'll obtain an x-ray of the right knee. We will dose 2 L of IV fluids and recheck chemistry.  Patient resting comfortably, awakens easily to voice. I discussed once again the plan to continue IV hydrating the patient and recheck labs. The creatinine is increasing or unchanged we will discuss admission to the hospital, if improving and believe it is safe for the patient be discharged home with continued supportive care at home, oral hydration and follow up with her PCP. Patient care signed out to Dr. Karma Greaser, repeat BMP pending.  ____________________________________________   FINAL CLINICAL IMPRESSION(S) / ED DIAGNOSES  Dehydration Acute renal insufficiency    Harvest Dark, MD 06/11/17 [phone]

## 2017-06-08 NOTE — ED Notes (Signed)
Attempt int initiation x1 without success. Pt states "you can't use my left arm, all you can get is my feet." call placed to noel for US guided iv start.

## 2017-06-09 DIAGNOSIS — N189 Chronic kidney disease, unspecified: Secondary | ICD-10-CM | POA: Diagnosis present

## 2017-06-09 DIAGNOSIS — M25561 Pain in right knee: Secondary | ICD-10-CM | POA: Diagnosis present

## 2017-06-09 DIAGNOSIS — Z7951 Long term (current) use of inhaled steroids: Secondary | ICD-10-CM | POA: Diagnosis not present

## 2017-06-09 DIAGNOSIS — E1122 Type 2 diabetes mellitus with diabetic chronic kidney disease: Secondary | ICD-10-CM | POA: Diagnosis not present

## 2017-06-09 DIAGNOSIS — J449 Chronic obstructive pulmonary disease, unspecified: Secondary | ICD-10-CM | POA: Diagnosis not present

## 2017-06-09 DIAGNOSIS — Z6841 Body Mass Index (BMI) 40.0 and over, adult: Secondary | ICD-10-CM | POA: Diagnosis not present

## 2017-06-09 DIAGNOSIS — Z885 Allergy status to narcotic agent status: Secondary | ICD-10-CM | POA: Diagnosis not present

## 2017-06-09 DIAGNOSIS — R569 Unspecified convulsions: Secondary | ICD-10-CM | POA: Diagnosis not present

## 2017-06-09 DIAGNOSIS — E860 Dehydration: Secondary | ICD-10-CM | POA: Diagnosis not present

## 2017-06-09 DIAGNOSIS — Z90710 Acquired absence of both cervix and uterus: Secondary | ICD-10-CM | POA: Diagnosis not present

## 2017-06-09 DIAGNOSIS — I129 Hypertensive chronic kidney disease with stage 1 through stage 4 chronic kidney disease, or unspecified chronic kidney disease: Secondary | ICD-10-CM | POA: Diagnosis not present

## 2017-06-09 DIAGNOSIS — N184 Chronic kidney disease, stage 4 (severe): Secondary | ICD-10-CM | POA: Diagnosis not present

## 2017-06-09 DIAGNOSIS — N179 Acute kidney failure, unspecified: Secondary | ICD-10-CM | POA: Diagnosis not present

## 2017-06-09 DIAGNOSIS — Z91013 Allergy to seafood: Secondary | ICD-10-CM | POA: Diagnosis not present

## 2017-06-09 DIAGNOSIS — E785 Hyperlipidemia, unspecified: Secondary | ICD-10-CM | POA: Diagnosis not present

## 2017-06-09 DIAGNOSIS — G894 Chronic pain syndrome: Secondary | ICD-10-CM | POA: Diagnosis not present

## 2017-06-09 DIAGNOSIS — Z886 Allergy status to analgesic agent status: Secondary | ICD-10-CM | POA: Diagnosis not present

## 2017-06-09 DIAGNOSIS — E559 Vitamin D deficiency, unspecified: Secondary | ICD-10-CM | POA: Diagnosis not present

## 2017-06-09 DIAGNOSIS — M545 Low back pain: Secondary | ICD-10-CM | POA: Diagnosis not present

## 2017-06-09 DIAGNOSIS — E662 Morbid (severe) obesity with alveolar hypoventilation: Secondary | ICD-10-CM | POA: Diagnosis not present

## 2017-06-09 LAB — BASIC METABOLIC PANEL
ANION GAP: 7 (ref 5–15)
BUN: 43 mg/dL — AB (ref 6–20)
CALCIUM: 8.9 mg/dL (ref 8.9–10.3)
CO2: 24 mmol/L (ref 22–32)
Chloride: 110 mmol/L (ref 101–111)
Creatinine, Ser: 2.11 mg/dL — ABNORMAL HIGH (ref 0.44–1.00)
GFR calc Af Amer: 29 mL/min — ABNORMAL LOW (ref >60)
GFR calc non Af Amer: 25 mL/min — ABNORMAL LOW (ref >60)
GLUCOSE: 109 mg/dL — AB (ref 65–99)
Potassium: 3.2 mmol/L — ABNORMAL LOW (ref 3.5–5.1)
Sodium: 141 mmol/L (ref 135–145)

## 2017-06-09 LAB — TSH: TSH: 1.188 u[IU]/mL (ref 0.350–4.500)

## 2017-06-09 MED ORDER — TRIAMCINOLONE ACETONIDE 40 MG/ML IJ SUSP
40.0000 mg | Freq: Once | INTRAMUSCULAR | Status: DC
  Filled 2017-06-09: qty 1

## 2017-06-09 MED ORDER — FUROSEMIDE 40 MG PO TABS: 40.0000 mg | ORAL_TABLET | Freq: Every day | ORAL | Status: DC

## 2017-06-09 MED ORDER — ZOLPIDEM TARTRATE 5 MG PO TABS: 10.0000 mg | ORAL_TABLET | Freq: Every evening | ORAL | Status: DC | PRN

## 2017-06-09 MED ORDER — DIPHENHYDRAMINE HCL 25 MG PO CAPS: 25.0000 mg | ORAL_CAPSULE | Freq: Four times a day (QID) | ORAL | Status: DC | PRN

## 2017-06-09 MED ORDER — DOCUSATE SODIUM 100 MG PO CAPS
100.0000 mg | ORAL_CAPSULE | Freq: Two times a day (BID) | ORAL | Status: DC
  Administered 2017-06-09 – 2017-06-10 (×3): 100 mg via ORAL
  Filled 2017-06-09 (×3): qty 1

## 2017-06-09 MED ORDER — HYDROCHLOROTHIAZIDE 12.5 MG PO CAPS
12.5000 mg | ORAL_CAPSULE | Freq: Every day | ORAL | Status: DC
  Administered 2017-06-09: 12.5 mg via ORAL
  Filled 2017-06-09: qty 1

## 2017-06-09 MED ORDER — ACETAMINOPHEN 650 MG RE SUPP: 650.0000 mg | Freq: Four times a day (QID) | RECTAL | Status: DC | PRN

## 2017-06-09 MED ORDER — ALPRAZOLAM 1 MG PO TABS
1.0000 mg | ORAL_TABLET | Freq: Two times a day (BID) | ORAL | Status: DC
  Administered 2017-06-09: 1 mg via ORAL
  Administered 2017-06-09: 1.5 mg via ORAL
  Administered 2017-06-10: 1 mg via ORAL
  Filled 2017-06-09 (×2): qty 1
  Filled 2017-06-09: qty 2

## 2017-06-09 MED ORDER — MIRTAZAPINE 15 MG PO TABS
45.0000 mg | ORAL_TABLET | Freq: Every day | ORAL | Status: DC
  Administered 2017-06-09: 45 mg via ORAL
  Filled 2017-06-09: qty 3

## 2017-06-09 MED ORDER — FUROSEMIDE 20 MG PO TABS: 20.0000 mg | ORAL_TABLET | Freq: Two times a day (BID) | ORAL | Status: DC

## 2017-06-09 MED ORDER — LIDOCAINE HCL (PF) 2 % IJ SOLN
10.0000 mL | Freq: Once | INTRAMUSCULAR | Status: DC
  Filled 2017-06-09: qty 10

## 2017-06-09 MED ORDER — SUCRALFATE 1 G PO TABS
1.0000 g | ORAL_TABLET | Freq: Four times a day (QID) | ORAL | Status: DC
  Administered 2017-06-09 – 2017-06-10 (×5): 1 g via ORAL
  Filled 2017-06-09 (×5): qty 1

## 2017-06-09 MED ORDER — HEPARIN SODIUM (PORCINE) 5000 UNIT/ML IJ SOLN
5000.0000 [IU] | Freq: Three times a day (TID) | INTRAMUSCULAR | Status: DC
  Administered 2017-06-09 – 2017-06-10 (×3): 5000 [IU] via SUBCUTANEOUS
  Filled 2017-06-09 (×3): qty 1

## 2017-06-09 MED ORDER — NITROGLYCERIN 0.3 MG SL SUBL: 0.3000 mg | SUBLINGUAL_TABLET | SUBLINGUAL | Status: DC | PRN

## 2017-06-09 MED ORDER — DIAZEPAM 5 MG PO TABS: 5.0000 mg | ORAL_TABLET | Freq: Three times a day (TID) | ORAL | Status: DC | PRN

## 2017-06-09 MED ORDER — CALCIUM LACTATE 648 MG PO TABS: 648.0000 mg | ORAL_TABLET | Freq: Two times a day (BID) | ORAL | Status: DC

## 2017-06-09 MED ORDER — ONDANSETRON HCL 4 MG PO TABS: 4.0000 mg | ORAL_TABLET | Freq: Four times a day (QID) | ORAL | Status: DC | PRN

## 2017-06-09 MED ORDER — LORATADINE 10 MG PO TABS
10.0000 mg | ORAL_TABLET | Freq: Every day | ORAL | Status: DC
  Administered 2017-06-09 – 2017-06-10 (×2): 10 mg via ORAL
  Filled 2017-06-09 (×2): qty 1

## 2017-06-09 MED ORDER — MECLIZINE HCL 25 MG PO TABS
25.0000 mg | ORAL_TABLET | Freq: Three times a day (TID) | ORAL | Status: DC | PRN
  Filled 2017-06-09: qty 1

## 2017-06-09 MED ORDER — LINAGLIPTIN 5 MG PO TABS
5.0000 mg | ORAL_TABLET | Freq: Every day | ORAL | Status: DC
  Administered 2017-06-09 – 2017-06-10 (×2): 5 mg via ORAL
  Filled 2017-06-09 (×2): qty 1

## 2017-06-09 MED ORDER — FUROSEMIDE 20 MG PO TABS: 20.0000 mg | ORAL_TABLET | Freq: Every day | ORAL | Status: DC

## 2017-06-09 MED ORDER — BACLOFEN 10 MG PO TABS
10.0000 mg | ORAL_TABLET | Freq: Three times a day (TID) | ORAL | Status: DC
  Administered 2017-06-09 – 2017-06-10 (×4): 10 mg via ORAL
  Filled 2017-06-09 (×6): qty 1

## 2017-06-09 MED ORDER — FENTANYL 12 MCG/HR TD PT72
12.0000 ug | MEDICATED_PATCH | TRANSDERMAL | Status: DC
  Administered 2017-06-09: 12.5 ug via TRANSDERMAL
  Filled 2017-06-09: qty 1

## 2017-06-09 MED ORDER — METOPROLOL SUCCINATE ER 50 MG PO TB24
50.0000 mg | ORAL_TABLET | Freq: Every day | ORAL | Status: DC
  Filled 2017-06-09: qty 1

## 2017-06-09 MED ORDER — TAMSULOSIN HCL 0.4 MG PO CAPS
0.4000 mg | ORAL_CAPSULE | Freq: Every day | ORAL | Status: DC
  Administered 2017-06-09 – 2017-06-10 (×2): 0.4 mg via ORAL
  Filled 2017-06-09 (×2): qty 1

## 2017-06-09 MED ORDER — ALPRAZOLAM 0.25 MG PO TABS: 0.2500 mg | ORAL_TABLET | Freq: Three times a day (TID) | ORAL | Status: DC | PRN

## 2017-06-09 MED ORDER — SODIUM CHLORIDE 0.9 % IV SOLN
INTRAVENOUS | Status: DC
  Administered 2017-06-09 – 2017-06-10 (×4): via INTRAVENOUS

## 2017-06-09 MED ORDER — POTASSIUM CHLORIDE CRYS ER 10 MEQ PO TBCR
10.0000 meq | EXTENDED_RELEASE_TABLET | Freq: Every day | ORAL | Status: DC
  Administered 2017-06-09 – 2017-06-10 (×2): 10 meq via ORAL
  Filled 2017-06-09 (×2): qty 1

## 2017-06-09 MED ORDER — ALBUTEROL SULFATE (2.5 MG/3ML) 0.083% IN NEBU
2.5000 mg | INHALATION_SOLUTION | Freq: Four times a day (QID) | RESPIRATORY_TRACT | Status: DC | PRN
Start: 2017-06-09 — End: 2017-06-10

## 2017-06-09 MED ORDER — SIMVASTATIN 40 MG PO TABS
40.0000 mg | ORAL_TABLET | Freq: Every day | ORAL | Status: DC
  Administered 2017-06-09 – 2017-06-10 (×2): 40 mg via ORAL
  Filled 2017-06-09 (×2): qty 1

## 2017-06-09 MED ORDER — L-METHYLFOLATE-B6-B12 3-35-2 MG PO TABS: 1.0000 | ORAL_TABLET | Freq: Two times a day (BID) | ORAL | Status: DC

## 2017-06-09 MED ORDER — FOLIC ACID 1 MG PO TABS
1.0000 mg | ORAL_TABLET | Freq: Every day | ORAL | Status: DC
  Administered 2017-06-09 – 2017-06-10 (×2): 1 mg via ORAL
  Filled 2017-06-09 (×2): qty 1

## 2017-06-09 MED ORDER — FLUTICASONE PROPIONATE 50 MCG/ACT NA SUSP
1.0000 | Freq: Two times a day (BID) | NASAL | Status: DC
  Administered 2017-06-09 – 2017-06-10 (×3): 1 via NASAL
  Filled 2017-06-09: qty 16

## 2017-06-09 MED ORDER — OXYCODONE HCL 5 MG PO TABS
5.0000 mg | ORAL_TABLET | Freq: Four times a day (QID) | ORAL | Status: DC | PRN
  Administered 2017-06-09 – 2017-06-10 (×3): 5 mg via ORAL
  Filled 2017-06-09: qty 1

## 2017-06-09 MED ORDER — HYDROXYZINE HCL 25 MG PO TABS
25.0000 mg | ORAL_TABLET | Freq: Three times a day (TID) | ORAL | Status: DC
  Administered 2017-06-09 – 2017-06-10 (×4): 25 mg via ORAL
  Filled 2017-06-09 (×4): qty 1

## 2017-06-09 MED ORDER — FENTANYL 25 MCG/HR TD PT72
25.0000 ug | MEDICATED_PATCH | TRANSDERMAL | Status: DC
  Administered 2017-06-09: 25 ug via TRANSDERMAL
  Filled 2017-06-09: qty 1

## 2017-06-09 MED ORDER — IRBESARTAN 150 MG PO TABS
300.0000 mg | ORAL_TABLET | Freq: Every day | ORAL | Status: DC
  Administered 2017-06-09: 300 mg via ORAL
  Filled 2017-06-09 (×2): qty 2

## 2017-06-09 MED ORDER — ACETAMINOPHEN 325 MG PO TABS: 650.0000 mg | ORAL_TABLET | Freq: Four times a day (QID) | ORAL | Status: DC | PRN

## 2017-06-09 MED ORDER — FERROUS SULFATE 325 (65 FE) MG PO TABS
325.0000 mg | ORAL_TABLET | Freq: Two times a day (BID) | ORAL | Status: DC
  Administered 2017-06-09 – 2017-06-10 (×3): 325 mg via ORAL
  Filled 2017-06-09 (×3): qty 1

## 2017-06-09 MED ORDER — CLONIDINE HCL 0.1 MG PO TABS
0.1000 mg | ORAL_TABLET | Freq: Every day | ORAL | Status: DC
  Administered 2017-06-09: 0.1 mg via ORAL
  Filled 2017-06-09: qty 1

## 2017-06-09 MED ORDER — NITROGLYCERIN 0.4 MG SL SUBL: 0.4000 mg | SUBLINGUAL_TABLET | SUBLINGUAL | Status: DC | PRN

## 2017-06-09 MED ORDER — CALCIUM CARBONATE ANTACID 500 MG PO CHEW
1.0000 | CHEWABLE_TABLET | Freq: Two times a day (BID) | ORAL | Status: DC
  Administered 2017-06-09 – 2017-06-10 (×3): 200 mg via ORAL
  Filled 2017-06-09 (×3): qty 1

## 2017-06-09 MED ORDER — OXYBUTYNIN CHLORIDE 5 MG PO TABS
5.0000 mg | ORAL_TABLET | Freq: Two times a day (BID) | ORAL | Status: DC
  Administered 2017-06-09 – 2017-06-10 (×3): 5 mg via ORAL
  Filled 2017-06-09 (×3): qty 1

## 2017-06-09 MED ORDER — PHENDIMETRAZINE TARTRATE 35 MG PO TABS: 35.0000 mg | ORAL_TABLET | Freq: Three times a day (TID) | ORAL | Status: DC

## 2017-06-09 MED ORDER — METOPROLOL SUCCINATE ER 100 MG PO TB24
100.0000 mg | ORAL_TABLET | Freq: Every day | ORAL | Status: DC
  Administered 2017-06-09: 100 mg via ORAL
  Filled 2017-06-09: qty 1

## 2017-06-09 MED ORDER — ONDANSETRON HCL 4 MG/2ML IJ SOLN: 4.0000 mg | Freq: Four times a day (QID) | INTRAMUSCULAR | Status: DC | PRN

## 2017-06-09 MED ORDER — VENLAFAXINE HCL ER 75 MG PO CP24
150.0000 mg | ORAL_CAPSULE | Freq: Every day | ORAL | Status: DC
  Administered 2017-06-09 – 2017-06-10 (×2): 150 mg via ORAL
  Filled 2017-06-09 (×2): qty 2

## 2017-06-09 MED ORDER — OLMESARTAN MEDOXOMIL-HCTZ 40-12.5 MG PO TABS: 1.0000 | ORAL_TABLET | Freq: Every day | ORAL | Status: DC

## 2017-06-09 MED ORDER — PANTOPRAZOLE SODIUM 40 MG PO TBEC
80.0000 mg | DELAYED_RELEASE_TABLET | Freq: Every day | ORAL | Status: DC
  Administered 2017-06-09: 80 mg via ORAL
  Filled 2017-06-09: qty 2

## 2017-06-09 NOTE — Progress Notes (Signed)
[facility], [place] 06/09/17  Subjective:  Patient known to our practice from 06/2014. Seen at that time for ARF Admitted for AKI, Dehydration Reports feeling dizzy and lightheaded prior to admission  Objective:  Vital signs in last 24 hours:  Temp:  [97.8 F (36.6 C)-98.8 F (37.1 C)] 97.8 F (36.6 C) (07/14 0436) Pulse Rate:  [44-72] 71 (07/14 0436) Resp:  [16-18] 16 (07/14 0436) BP: (96-132)/(66-109) 111/74 (07/14 0436) SpO2:  [93 %-100 %] 100 % (07/14 0436) Weight:  [133.8 kg (295 lb)] 133.8 kg (295 lb) (07/13 1729)  Weight change:  Filed Weights   06/08/17 1729  Weight: 133.8 kg (295 lb)    Intake/Output:    Intake/Output Summary (Last 24 hours) at 06/09/17 0953 Last data filed at 06/09/17 0934  Gross per 24 hour  Intake             2040 ml  Output                0 ml  Net             2040 ml     Physical Exam: General: NAD, laying in bed  HEENT Moist oral mucus membranes  Neck supple  Pulm/lungs Clear b/l  CVS/Heart Regular, no rub  Abdomen:  Obese, soft, NT  Extremities: Trace to 1+ dependent edema  Neurologic: Alert, oreinted  Skin: No acute rashes           Basic Metabolic Panel:   Recent Labs Lab 06/08/17 1738 06/09/17 0100  NA 138 141  K 3.7 3.2*  CL 106 110  CO2 23 24  GLUCOSE 130* 109*  BUN 45* 43*  CREATININE 2.33* 2.11*  CALCIUM 9.4 8.9     CBC:  Recent Labs Lab 06/08/17 1738  WBC 9.7  HGB 12.4  HCT 37.2  MCV 84.3  PLT 331     No results found for: HEPBSAG, HEPBSAB, HEPBIGM    Microbiology:  No results found for this or any previous visit (from the past 240 hour(s)).  Coagulation Studies: No results for input(s): LABPROT, INR in the last 72 hours.  Urinalysis:  Recent Labs  06/08/17 1924  COLORURINE YELLOW*  LABSPEC 1.013  PHURINE 5.0  GLUCOSEU NEGATIVE  HGBUR NEGATIVE  BILIRUBINUR NEGATIVE  KETONESUR NEGATIVE  PROTEINUR NEGATIVE  NITRITE NEGATIVE  LEUKOCYTESUR  NEGATIVE      Imaging: Dg Knee Complete 4 Views Right  Result Date: 06/08/2017 CLINICAL DATA:  Lateral RIGHT knee pain for 4 days, fell out of bed Wednesday but had pain prior to that as well EXAM: RIGHT KNEE - COMPLETE 4+ VIEW COMPARISON:  03/06/2010 FINDINGS: Osseous demineralization. Mild joint space narrowing and spur formation greatest at patellofemoral joint. No acute fracture, dislocation, or bone destruction. No knee joint effusion. IMPRESSION: Osteoarthritic changes RIGHT knee greatest at patellofemoral joint. Electronically Signed   By: Lavonia Dana M.D.   On: 06/08/2017 20:54     Medications:   . sodium chloride     . ALPRAZolam  1-1.5 mg Oral BID  . baclofen  10 mg Oral TID  . calcium carbonate  1 tablet Oral BID  . cloNIDine  0.1 mg Oral QHS  . docusate sodium  100 mg Oral BID  . fentaNYL  12.5 mcg Transdermal Q3 days  . fentaNYL  25 mcg Transdermal Q3 days  . ferrous sulfate  325 mg Oral BID WC  . fluticasone  1 spray Each Nare BID  . folic acid  1 mg Oral Daily  . heparin  5,000 Units Subcutaneous Q8H  . hydrOXYzine  25 mg Oral TID  . irbesartan  300 mg Oral Daily  . linagliptin  5 mg Oral Daily  . loratadine  10 mg Oral Daily  . [START ON 06/10/2017] metoprolol succinate  50 mg Oral Daily  . mirtazapine  45 mg Oral QHS  . oxybutynin  5 mg Oral BID  . oxyCODONE-acetaminophen  1 tablet Oral Once  . pantoprazole  80 mg Oral Q1200  . potassium chloride  10 mEq Oral Daily  . simvastatin  40 mg Oral Daily  . sucralfate  1 g Oral QID  . tamsulosin  0.4 mg Oral Daily  . venlafaxine XR  150 mg Oral Q breakfast   acetaminophen **OR** acetaminophen, albuterol, ALPRAZolam, diazepam, diphenhydrAMINE, meclizine, nitroGLYCERIN, ondansetron **OR** ondansetron (ZOFRAN) IV, oxyCODONE, oxyCODONE, zolpidem  Assessment/ Plan:  56 y.o.African [place] female with HTN, COPD, admitted for ARF  1. ARF . Baseline Creatinine 0.30 Likely secondary to volume depletion for  diuretics Furosemide, olmesartan-HCTZ Agree with iv fluids  Monitor BMP  2. Hypokalemia - replace as necessary    LOS: 0 [facility] 7/14/20189:53 [place], [place]

## 2017-06-09 NOTE — ED Notes (Signed)
Report to butch, rn

## 2017-06-09 NOTE — Consult Note (Signed)
Patient is a 56 year old with a long history of knee problems with a prior left total knee about 9010 years ago by Dr. Mauri Pole. She reports increasing right knee pain. She has previously seen Dr. Marry Guan for this and had minimal degenerative changes to years ago. Per her x-ray here shows progression of her arthritis particularly patellofemoral even though with nonweightbearing films assistants difficult to tell does seem like the joint space is more narrow than her prior x-ray. On exam she has swelling to the knee diffusely with range of motion of 80 before she gets a great deal of pain anteriorly in the knee and full extension with significant pain with full extension. Impression is flare of osteoarthritis right knee Plan is for intra-articular corticosteroid injection

## 2017-06-09 NOTE — H&P (Signed)
Lindsey [name] is an 56 y.o. female.   Chief Complaint: Dehydration HPI: The patient with past medical history of chronic back pain, diabetes, hypertension and chronic kidney disease presents emergency department due to symptoms of dehydration. The patient is familiar with this constellation of symptoms from previous experience with dehydration. She is on Lasix due to chronic kidney disease and has experienced dry mouth, blurred vision and malaise for the last few days. She had gone to her primary care physician for laboratory evaluation prior to pain management clinic. She was found to have acute on chronic kidney disease, thus she sought evaluation in the emergency department. She was bolused with normal saline and encouraged to intake water by mouth. However, upon recheck the patient's creatinine was still above baseline and dropped to the emergency department staff to call the hospitalist service for admission.  Past Medical History:  Diagnosis Date  . Allergy   . Anemia   . Arthritis    knees  . COPD (chronic obstructive pulmonary disease) ([place])   . Diabetes mellitus without complication (HCC)    diet controlled  . GERD (gastroesophageal reflux disease)   . Heart attack ([place]) 2002  . Hyperlipidemia   . Hypertension   . PUD (peptic ulcer disease)   . Sleep apnea    can't tolerate CPAP.  Uses O2 only  . Spinal cord stimulator status    for lower back pain  . Stroke [facility] 2004   "mini-stroke"  . Vertigo   . Wears dentures    full upper and lower    Past Surgical History:  Procedure Laterality Date  . ABDOMINAL HYSTERECTOMY    . back surgery    . broken wrist    . COLONOSCOPY WITH PROPOFOL N/A 01/30/2017   Procedure: COLONOSCOPY WITH PROPOFOL;  Surgeon: Jonathon Bellows, MD;  Location: [facility] ENDOSCOPY;  Service: Endoscopy;  Laterality: N/A;  . COLONOSCOPY WITH PROPOFOL N/A 02/20/2017   Procedure: Colonoscopy with propofol ;  Surgeon: Jonathon Bellows, MD;  Location: [facility] ENDOSCOPY;  Service:  Endoscopy;  Laterality: N/A;  . COLONOSCOPY WITH PROPOFOL N/A 03/13/2017   Procedure: COLONOSCOPY WITH PROPOFOL;  Surgeon: Jonathon Bellows, MD;  Location: [facility] ENDOSCOPY;  Service: Endoscopy;  Laterality: N/A;  . COLONOSCOPY WITH PROPOFOL N/A 04/17/2017   Procedure: COLONOSCOPY WITH PROPOFOL;  Surgeon: Jonathon Bellows, MD;  Location[facility] ENDOSCOPY;  Service: Endoscopy;  Laterality: N/A;  . ESOPHAGOGASTRODUODENOSCOPY (EGD) WITH PROPOFOL N/A 09/28/2016   Procedure: ESOPHAGOGASTRODUODENOSCOPY (EGD) WITH PROPOFOL;  Surgeon: Manya Silvas, MD;  Location: [facility] ENDOSCOPY;  Service: Endoscopy;  Laterality: N/A;  . ESOPHAGOGASTRODUODENOSCOPY (EGD) WITH PROPOFOL N/A 03/13/2017   Procedure: ESOPHAGOGASTRODUODENOSCOPY (EGD) WITH PROPOFOL;  Surgeon: Jonathon Bellows, MD;  Location: [facility] ENDOSCOPY;  Service: Endoscopy;  Laterality: N/A;  . GALLBLADDER SURGERY    . GASTRIC BYPASS    . GIVENS CAPSULE STUDY N/A 03/13/2017   Procedure: GIVENS CAPSULE STUDY;  Surgeon: Jonathon Bellows, MD;  Location: [facility] ENDOSCOPY;  Service: Endoscopy;  Laterality: N/A;  . HERNIA REPAIR    . JOINT REPLACEMENT  1995  . REPLACEMENT TOTAL KNEE    . ROTATOR CUFF REPAIR      Family History  Problem Relation Age of Onset  . Hypertension Father   . COPD Father   . Heart disease Father   . Breast cancer Neg Hx    Social History:  reports that she has never smoked. She has never used smokeless tobacco. She reports that she does not drink alcohol or use drugs.  Allergies:  Allergies  Allergen Reactions  . Morphine Hives  . Iodinated Diagnostic Agents Hives  . Aspirin Hives    Noted on MD progress notes and discussed with MD 09/02/15  . Etodolac Hives  . Ibuprofen Hives  . Shellfish Allergy Hives  . Tylenol [Acetaminophen] Hives    Upset stomach     Medications Prior to Admission  Medication Sig Dispense Refill  . albuterol (PROVENTIL HFA;VENTOLIN HFA) 108 (90 BASE) MCG/ACT inhaler Inhale 2 puffs into the lungs every 4 (four) hours as  needed for wheezing or shortness of breath.    Marland Kitchen albuterol (PROVENTIL) (2.5 MG/3ML) 0.083% nebulizer solution Take 2.5 mg by nebulization every 6 (six) hours as needed for wheezing or shortness of breath.    . ALPRAZolam (XANAX) 0.25 MG tablet Take 0.25 mg by mouth 3 (three) times daily as needed for anxiety.     . ALPRAZolam (XANAX) 1 MG tablet Take 1-1.5 mg by mouth 2 (two) times daily. Pt takes one tablet in the morning and one and one-half tablet at night.    . baclofen (LIORESAL) 10 MG tablet Take 10 mg by mouth 3 (three) times daily.    . Calcium Carbonate-Vitamin D (OYSTER SHELL CALCIUM 500 + D PO)   0  . Calcium Lactate 648 MG TABS Take 648 mg by mouth 2 (two) times daily.    . cloNIDine (CATAPRES) 0.1 MG tablet Take 0.1 mg by mouth at bedtime.     Marland Kitchen desvenlafaxine (PRISTIQ) 100 MG 24 hr tablet Take 100 mg by mouth daily. Pt takes 100 mg tab and 50 mg tab for total of 150 mg    . desvenlafaxine (PRISTIQ) 50 MG 24 hr tablet Take 50 mg by mouth daily. Pt takes 100 mg tab and 50 mg tab for total of 150 mg    . diazepam (VALIUM) 5 MG tablet To be taken 3 times daily with Antivert for vertigo as needed 20 tablet 0  . esomeprazole (NEXIUM) 40 MG capsule Take 40 mg by mouth 2 (two) times daily.    . fentaNYL (DURAGESIC - DOSED MCG/HR) 12 MCG/HR Place 12 mcg onto the skin every 3 (three) days.    . fentaNYL (DURAGESIC - DOSED MCG/HR) 25 MCG/HR patch Place 25 mcg onto the skin every 3 (three) days.    . ferrous sulfate 325 (65 FE) MG tablet Take 1 tablet (325 mg total) by mouth 2 (two) times daily with a meal. 60 tablet 0  . fluticasone (FLONASE) 50 MCG/ACT nasal spray Place 1 spray into both nostrils 2 (two) times daily.    . folic acid (FOLVITE) 1 MG tablet Take 1 mg by mouth daily.    . furosemide (LASIX) 20 MG tablet Take 20-40 mg by mouth 2 (two) times daily. '40mg'$  in the morning and '20mg'$  in the evening    . hydrOXYzine (ATARAX/VISTARIL) 25 MG tablet Take 25 mg by mouth 3 (three) times daily.     Marland Kitchen l-methylfolate-B6-B12 (METANX) 3-35-2 MG TABS tablet Take 1 tablet by mouth 2 (two) times daily.    . meclizine (ANTIVERT) 25 MG tablet Take 1 tablet (25 mg total) by mouth 3 (three) times daily as needed for dizziness or nausea. 30 tablet 1  . metoprolol succinate (TOPROL-XL) 100 MG 24 hr tablet Take 100 mg by mouth daily. Take with or immediately following a meal.    . mirtazapine (REMERON) 45 MG tablet Take 45 mg by mouth at bedtime.    . nitroGLYCERIN (NITROSTAT) 0.3 MG SL tablet  Place under the tongue.    Marland Kitchen olmesartan-hydrochlorothiazide (BENICAR HCT) 40-12.5 MG tablet Take 1 tablet by mouth daily.    . ONGLYZA 5 MG TABS tablet Take 1 tablet by mouth daily.    Marland Kitchen oxybutynin (DITROPAN) 5 MG tablet Take 5 mg by mouth 2 (two) times daily.    Marland Kitchen oxyCODONE (OXY IR/ROXICODONE) 5 MG immediate release tablet Take 5 mg by mouth every 6 (six) hours as needed for severe pain.    Marland Kitchen Phendimetrazine Tartrate 35 MG TABS Take 35 mg by mouth 3 (three) times daily.   0  . potassium chloride (K-DUR) 10 MEQ tablet Take 10 mEq by mouth daily.    . simvastatin (ZOCOR) 40 MG tablet Take 1 tablet by mouth daily.    . sucralfate (CARAFATE) 1 g tablet Take 1 g by mouth 4 (four) times daily.     . tamsulosin (FLOMAX) 0.4 MG CAPS capsule Take 0.4 mg by mouth daily.  0  . zolpidem (AMBIEN) 10 MG tablet Take 10 mg by mouth at bedtime as needed for sleep.    . fexofenadine (ALLEGRA) 180 MG tablet   0  . ONE TOUCH ULTRA TEST test strip     . Vitamin D, Ergocalciferol, (DRISDOL) 50000 units CAPS capsule Take 1 capsule (50,000 Units total) by mouth every 7 (seven) days. (Patient not taking: Reported on 06/09/2017) 30 capsule 5    Results for orders placed or performed during the hospital encounter of 06/08/17 (from the past 48 hour(s))  Basic metabolic panel     Status: Abnormal   Collection Time: 06/08/17  5:38 PM  Result Value Ref [place]   Sodium 138 135 - 145 mmol/L   Potassium 3.7 3.5 - 5.1 mmol/L   Chloride 106  101 - 111 mmol/L   CO2 23 22 - 32 mmol/L   Glucose, Bld 130 (H) 65 - 99 mg/dL   BUN 45 (H) 6 - 20 mg/dL   Creatinine, Ser 2.33 (H) 0.44 - 1.00 mg/dL   Calcium 9.4 8.9 - 10.3 mg/dL   GFR calc non Af Amer 22 (L) >60 mL/min   GFR calc Af Amer 25 (L) >60 mL/min    Comment: (NOTE) The eGFR has been calculated using the CKD EPI equation. This calculation has not been validated in all clinical situations. eGFR's persistently <60 mL/min signify possible Chronic Kidney Disease.    Anion gap 9 5 - 15  CBC     Status: Abnormal   Collection Time: 06/08/17  5:38 PM  Result Value Ref [place]   WBC 9.7 3.6 - 11.0 K/uL   RBC 4.42 3.80 - 5.20 MIL/uL   Hemoglobin 12.4 12.0 - 16.0 g/dL   HCT 37.2 35.0 - 47.0 %   MCV 84.3 80.0 - 100.0 fL   MCH 28.0 26.0 - 34.0 pg   MCHC 33.2 32.0 - 36.0 g/dL   RDW 15.7 (H) 11.5 - 14.5 %   Platelets 331 150 - 440 K/uL  Troponin I     Status: None   Collection Time: 06/08/17  5:38 PM  Result Value Ref [place]   Troponin I <0.03 <0.03 ng/mL  Hepatic function panel     Status: None   Collection Time: 06/08/17  5:38 PM  Result Value Ref [place]   Total Protein 7.5 6.5 - 8.1 g/dL   Albumin 3.8 3.5 - 5.0 g/dL   AST 21 15 - 41 U/L   ALT 16 14 - 54 U/L   Alkaline Phosphatase 113 38 -  126 U/L   Total Bilirubin 0.7 0.3 - 1.2 mg/dL   Bilirubin, Direct 0.1 0.1 - 0.5 mg/dL   Indirect Bilirubin 0.6 0.3 - 0.9 mg/dL  Lipase, blood     Status: None   Collection Time: 06/08/17  5:38 PM  Result Value Ref [place]   Lipase 23 11 - 51 U/L  TSH     Status: None   Collection Time: 06/08/17  5:38 PM  Result Value Ref [place]   TSH 1.188 0.350 - 4.500 uIU/mL    Comment: Performed by a 3rd Generation assay with a functional sensitivity of <=0.01 uIU/mL.  Urinalysis, Complete w Microscopic     Status: Abnormal   Collection Time: 06/08/17  7:24 PM  Result Value Ref [place]   Color, Urine YELLOW (A) YELLOW   APPearance HAZY (A) CLEAR   Specific Gravity, Urine 1.013 1.005 - 1.030   pH 5.0  5.0 - 8.0   Glucose, UA NEGATIVE NEGATIVE mg/dL   Hgb urine dipstick NEGATIVE NEGATIVE   Bilirubin Urine NEGATIVE NEGATIVE   Ketones, ur NEGATIVE NEGATIVE mg/dL   Protein, ur NEGATIVE NEGATIVE mg/dL   Nitrite NEGATIVE NEGATIVE   Leukocytes, UA NEGATIVE NEGATIVE   RBC / HPF 0-5 0 - 5 RBC/hpf   WBC, UA 0-5 0 - 5 WBC/hpf   Bacteria, UA NONE SEEN NONE SEEN   Squamous Epithelial / LPF 6-30 (A) NONE SEEN   Mucous PRESENT    Hyaline Casts, UA PRESENT   Basic metabolic panel     Status: Abnormal   Collection Time: 06/09/17  1:00 AM  Result Value Ref [place]   Sodium 141 135 - 145 mmol/L   Potassium 3.2 (L) 3.5 - 5.1 mmol/L   Chloride 110 101 - 111 mmol/L   CO2 24 22 - 32 mmol/L   Glucose, Bld 109 (H) 65 - 99 mg/dL   BUN 43 (H) 6 - 20 mg/dL   Creatinine, Ser 2.11 (H) 0.44 - 1.00 mg/dL   Calcium 8.9 8.9 - 10.3 mg/dL   GFR calc non Af Amer 25 (L) >60 mL/min   GFR calc Af Amer 29 (L) >60 mL/min    Comment: (NOTE) The eGFR has been calculated using the CKD EPI equation. This calculation has not been validated in all clinical situations. eGFR's persistently <60 mL/min signify possible Chronic Kidney Disease.    Anion gap 7 5 - 15   Dg Knee Complete 4 Views Right  Result Date: 06/08/2017 CLINICAL DATA:  Lateral RIGHT knee pain for 4 days, fell out of bed Wednesday but had pain prior to that as well EXAM: RIGHT KNEE - COMPLETE 4+ VIEW COMPARISON:  03/06/2010 FINDINGS: Osseous demineralization. Mild joint space narrowing and spur formation greatest at patellofemoral joint. No acute fracture, dislocation, or bone destruction. No knee joint effusion. IMPRESSION: Osteoarthritic changes RIGHT knee greatest at patellofemoral joint. Electronically Signed   By: Lavonia Dana M.D.   On: 06/08/2017 20:54    Review of Systems  Constitutional: Positive for malaise/fatigue. Negative for chills and fever.  HENT: Negative for sore throat and tinnitus.   Eyes: Positive for blurred vision (resolved). Negative  for redness.  Respiratory: Negative for cough and shortness of breath.   Cardiovascular: Negative for chest pain, palpitations, orthopnea and PND.  Gastrointestinal: Negative for abdominal pain, diarrhea, nausea and vomiting.  Genitourinary: Negative for dysuria, frequency and urgency.  Musculoskeletal: Negative for joint pain and myalgias.       Right thigh pain  Skin: Negative for rash.  No lesions  Neurological: Negative for speech change, focal weakness and weakness.  Endo/Heme/Allergies: Does not bruise/bleed easily.       No temperature intolerance  Psychiatric/Behavioral: Negative for depression and suicidal ideas.    Blood pressure 111/74, pulse 71, temperature 97.8 F (36.6 C), temperature source Oral, resp. rate 16, height '5\' 7"'$  (1.702 m), weight 133.8 kg (295 lb), SpO2 100 %. Physical Exam  Vitals reviewed. Constitutional: She is oriented to person, place, and time. She appears well-developed and well-nourished. No distress.  HENT:  Head: Normocephalic and atraumatic.  Mouth/Throat: Oropharynx is clear and moist.  Eyes: Pupils are equal, round, and reactive to light. Conjunctivae and EOM are normal. No scleral icterus.  Neck: Normal range of motion. Neck supple. No JVD present. No tracheal deviation present. No thyromegaly present.  Cardiovascular: Normal rate, regular rhythm and normal heart sounds.  Exam reveals no gallop and no friction rub.   No murmur heard. Respiratory: Effort normal and breath sounds normal.  GI: Soft. Bowel sounds are normal. She exhibits no distension. There is no tenderness.  Genitourinary:  Genitourinary Comments: Deferred  Musculoskeletal: Normal range of motion. She exhibits no edema.  Lymphadenopathy:    She has no cervical adenopathy.  Neurological: She is alert and oriented to person, place, and time. No cranial nerve deficit. She exhibits normal muscle tone.  Skin: Skin is warm and dry. No rash noted. No erythema.  Psychiatric: She  has a normal mood and affect. Her behavior is normal. Judgment and thought content normal.     Assessment/Plan This is a 56 year old female admitted for acute on chronic kidney disease. 1. Acute on chronic kidney disease: Secondary to dehydration. Continue IV hydration. Hold Lasix. Encourage oral intake. Monitor kidney function. Avoid nephrotoxic agents. Nephrology has been consulted. 2. Diabetes mellitus type 2: Continue Tradjenta. Sliding-scale insulin hospitalized. 3. Hypertension: Controlled; continue clonidine, Avapro, metoprolol and hydrochlorothiazide. 4. Chronic low back pain: The patient has a spinal stimulator in place. Continue oral pain medication per home regimen including fentanyl patches. 5. Depression/chronic pain syndrome: Continue Effexor for and Remeron 6. Polypharmacy: Consider decreasing medication 7. DVT prophylaxis: Heparin 8. GI prophylaxis: Pantoprazole per home regimen The patient is a full code. Time spent on admission orders and patient care approximately 45 minutes  Harrie Foreman, MD 06/09/2017, 7:09 AM

## 2017-06-09 NOTE — ED Notes (Signed)
Pt assited up to commode by staff for urine.

## 2017-06-09 NOTE — Progress Notes (Signed)
Admitted this morning for acute on chronic renal failure with dehydration. Started on IV hydration,/  Medications, labs reviewed.  1. acute on chronic kidney disease due to dehydration: Continue IV hydration, stop Lasix, K Function closely.  #2. Depression, chronic pain: Continue Effexor and Remeron #3 essential hypertension: Controlled. Continue clonidine, Avapro, metoprolol, hold HCTZ. #4 chronic back pain: Patient has a  Spinal stimulator in place. Time sepnt;20 min

## 2017-06-09 NOTE — ED Notes (Signed)
Pt ringing call bell but declines needs at this time.

## 2017-06-09 NOTE — ED Notes (Signed)
Butch, rn in to attempt iv initiation.

## 2017-06-10 NOTE — Op Note (Signed)
Diagnosis is right knee arthritis and procedures right knee intra-articular corticosteroid injection Informed consent was obtained timeout procedure carried out. Skin was prepped in the anterior lateral knee with first alcohol and chlorhexidine after allowing this to dry 22-gauge needle was inserted with 40 mg Kenalog and 5 cc 1% Xylocaine infiltrated into the joint with additional lidocaine infiltrated subcutaneously  Band-Aid was applied and the patient tolerated procedure well complications no blood loss

## 2017-06-10 NOTE — Progress Notes (Signed)
[facility], [place] 06/10/17  Subjective:  Patient known to our practice from 06/2014. Seen at that time for ARF Admitted for AKI, Dehydration Reports feeling dizzy and lightheaded prior to admission Feels much better today with IV fluid replacement. Able to tolerate diet without nausea or vomiting   Objective:  Vital signs in last 24 hours:  Temp:  [98 F (36.7 C)-98.4 F (36.9 C)] 98.3 F (36.8 C) (07/15 0350) Pulse Rate:  [63-72] 63 (07/15 0350) Resp:  [17-18] 18 (07/15 0350) BP: (104-115)/(57-73) 104/57 (07/15 0350) SpO2:  [98 %-100 %] 99 % (07/15 0350) Weight:  [140.9 kg (310 lb 10.8 oz)] 140.9 kg (310 lb 10.8 oz) (07/15 0323)  Weight change: 7.111 kg (15 lb 10.8 oz) Filed Weights   06/08/17 1729 06/10/17 0323  Weight: 133.8 kg (295 lb) (!) 140.9 kg (310 lb 10.8 oz)    Intake/Output:    Intake/Output Summary (Last 24 hours) at 06/10/17 1141 Last data filed at 06/10/17 0900  Gross per 24 hour  Intake           2831.1 ml  Output              200 ml  Net           2631.1 ml     Physical Exam: General: NAD, laying in bed  HEENT Moist oral mucus membranes  Neck supple  Pulm/lungs Clear b/l  CVS/Heart Regular, no rub  Abdomen:  Obese, soft, NT  Extremities: Trace to 1+ dependent edema  Neurologic: Alert, oreinted  Skin: No acute rashes           Basic Metabolic Panel:   Recent Labs Lab 06/08/17 1738 06/09/17 0100  NA 138 141  K 3.7 3.2*  CL 106 110  CO2 23 24  GLUCOSE 130* 109*  BUN 45* 43*  CREATININE 2.33* 2.11*  CALCIUM 9.4 8.9     CBC:  Recent Labs Lab 06/08/17 1738  WBC 9.7  HGB 12.4  HCT 37.2  MCV 84.3  PLT 331     No results found for: HEPBSAG, HEPBSAB, HEPBIGM    Microbiology:  No results found for this or any previous visit (from the past 240 hour(s)).  Coagulation Studies: No results for input(s): LABPROT, INR in the last 72 hours.  Urinalysis:  Recent Labs  06/08/17 1924   COLORURINE YELLOW*  LABSPEC 1.013  PHURINE 5.0  GLUCOSEU NEGATIVE  HGBUR NEGATIVE  BILIRUBINUR NEGATIVE  KETONESUR NEGATIVE  PROTEINUR NEGATIVE  NITRITE NEGATIVE  LEUKOCYTESUR NEGATIVE      Imaging: Dg Knee Complete 4 Views Right  Result Date: 06/08/2017 CLINICAL DATA:  Lateral RIGHT knee pain for 4 days, fell out of bed Wednesday but had pain prior to that as well EXAM: RIGHT KNEE - COMPLETE 4+ VIEW COMPARISON:  03/06/2010 FINDINGS: Osseous demineralization. Mild joint space narrowing and spur formation greatest at patellofemoral joint. No acute fracture, dislocation, or bone destruction. No knee joint effusion. IMPRESSION: Osteoarthritic changes RIGHT knee greatest at patellofemoral joint. Electronically Signed   By: Lavonia Dana M.D.   On: 06/08/2017 20:54     Medications:   . sodium chloride 150 mL/hr at 06/10/17 0936   . ALPRAZolam  1-1.5 mg Oral BID  . baclofen  10 mg Oral TID  . calcium carbonate  1 tablet Oral BID  . docusate sodium  100 mg Oral BID  . fentaNYL  12.5 mcg Transdermal Q3 days  . fentaNYL  25 mcg Transdermal Q3 days  .  ferrous sulfate  325 mg Oral BID WC  . fluticasone  1 spray Each Nare BID  . folic acid  1 mg Oral Daily  . heparin  5,000 Units Subcutaneous Q8H  . hydrOXYzine  25 mg Oral TID  . irbesartan  300 mg Oral Daily  . lidocaine  10 mL Other Once  . linagliptin  5 mg Oral Daily  . loratadine  10 mg Oral Daily  . metoprolol succinate  50 mg Oral Daily  . mirtazapine  45 mg Oral QHS  . oxybutynin  5 mg Oral BID  . oxyCODONE-acetaminophen  1 tablet Oral Once  . pantoprazole  80 mg Oral Q1200  . potassium chloride  10 mEq Oral Daily  . simvastatin  40 mg Oral Daily  . sucralfate  1 g Oral QID  . tamsulosin  0.4 mg Oral Daily  . triamcinolone acetonide  40 mg Intra-articular Once  . venlafaxine XR  150 mg Oral Q breakfast   acetaminophen **OR** acetaminophen, albuterol, ALPRAZolam, diazepam, diphenhydrAMINE, meclizine, nitroGLYCERIN,  ondansetron **OR** ondansetron (ZOFRAN) IV, oxyCODONE, oxyCODONE, zolpidem  Assessment/ Plan:  56 y.o.African [place] female with HTN, COPD, admitted for ARF  1. ARF . Baseline Creatinine 0.30 Likely secondary to volume depletion From diuretics. Outpatient patient was taking Furosemide, olmesartan-HCTZ Agree with iv fluids for now but discontinue after this bag as oral intake has improved significantly Monitor BMP  2. Hypokalemia - replace as necessary  Follow-up outpatient    LOS: 1 [facility] 7/15/201811:41 [place], [place]

## 2017-06-10 NOTE — Progress Notes (Signed)
Pt d/c home; d/c instructions reviewed w/ pt; pt understanding was verbalized; IV removed, catheter in tact, gauze dressing applied; all pt questions answered; pt verbalized that all pt belongings were accounted for; pt left unit via wheelchair accompanied by staff 

## 2017-06-10 NOTE — Discharge Instructions (Signed)
Follow all MD discharge instructions. Take all medications as prescribed. Keep all follow up appointments. If your symptoms return, call your doctor. If you experience any new symptoms that are of concern to you or that are bothersome to you, call your doctor. For all questions and/or concerns, call your doctor.  If you have a medical emergency, call 911   Acute Kidney Injury, Adult Acute kidney injury is a sudden worsening of kidney function. The kidneys are organs that have several jobs. They filter the blood to remove waste products and extra fluid. They also maintain a healthy balance of minerals and hormones in the body, which helps control blood pressure and keep bones strong. With this condition, your kidneys do not do their jobs as well as they should. This condition ranges from mild to severe. Over time it may develop into long-lasting (chronic) kidney disease. Early detection and treatment may prevent acute kidney injury from developing into a chronic condition. What are the causes? Common causes of this condition include:  A problem with blood flow to the kidneys. This may be caused by: ? Low blood pressure (hypotension) or shock. ? Blood loss. ? Heart and blood vessel (cardiovascular) disease. ? Severe burns. ? Liver disease.  Direct damage to the kidneys. This may be caused by: ? Certain medicines. ? A kidney infection. ? Poisoning. ? Being around or in contact with toxic substances. ? A surgical wound. ? A hard, direct hit to the kidney area.  A sudden blockage of urine flow. This may be caused by: ? Cancer. ? Kidney stones. ? An enlarged prostate in males.  What are the signs or symptoms? Symptoms of this condition may not be obvious until the condition becomes severe. Symptoms of this condition can include:  Tiredness (lethargy), or difficulty staying awake.  Nausea or vomiting.  Swelling (edema) of the face, legs, ankles, or feet.  Problems with urination,  such as: ? Abdominal pain, or pain along the side of your stomach (flank). ? Decreased urine production. ? Decrease in the force of urine flow.  Muscle twitches and cramps, especially in the legs.  Confusion or trouble concentrating.  Loss of appetite.  Fever.  How is this diagnosed? This condition may be diagnosed with tests, including:  Blood tests.  Urine tests.  Imaging tests.  A test in which a sample of tissue is removed from the kidneys to be examined under a microscope (kidney biopsy).  How is this treated? Treatment for this condition depends on the cause and how severe the condition is. In mild cases, treatment may not be needed. The kidneys may heal on their own. In more severe cases, treatment will involve:  Treating the cause of the kidney injury. This may involve changing any medicines you are taking or adjusting your dosage.  Fluids. You may need specialized IV fluids to balance your body's needs.  Having a catheter placed to drain urine and prevent blockages.  Preventing problems from occurring. This may mean avoiding certain medicines or procedures that can cause further injury to the kidneys.  In some cases treatment may also require:  A procedure to remove toxic wastes from the body (dialysis or continuous renal replacement therapy - CRRT).  Surgery. This may be done to repair a torn kidney, or to remove the blockage from the urinary system.  Follow these instructions at home: Medicines  Take over-the-counter and prescription medicines only as told by your health care provider.  Do not take any new medicines without  your health care provider's approval. Many medicines can worsen your kidney damage.  Do not take any vitamin and mineral supplements without your health care provider's approval. Many nutritional supplements can worsen your kidney damage. Lifestyle  If your health care provider prescribed changes to your diet, follow them. You may need  to decrease the amount of protein you eat.  Achieve and maintain a healthy weight. If you need help with this, ask your health care provider.  Start or continue an exercise plan. Try to exercise at least 30 minutes a day, 5 days a week.  Do not use any tobacco products, such as cigarettes, chewing tobacco, and e-cigarettes. If you need help quitting, ask your health care provider. General instructions  Keep track of your blood pressure. Report changes in your blood pressure as told by your health care provider.  Stay up to date with immunizations. Ask your health care provider which immunizations you need.  Keep all follow-up visits as told by your health care provider. This is important. Where to find more information:  American Association of Kidney Patients: BombTimer.gl  National Kidney Foundation: www.kidney.[place]: https://mathis.com/  [facility]: ? www.lifeoptions.org ? www.kidneyschool.org Contact a health care provider if:  Your symptoms get worse.  You develop new symptoms. Get help right away if:  You develop symptoms of worsening kidney disease, which include: ? Headaches. ? Abnormally dark or light skin. ? Easy bruising. ? Frequent hiccups. ? Chest pain. ? Shortness of breath. ? End of menstruation in women. ? Seizures. ? Confusion or altered mental status. ? Abdominal or back pain. ? Itchiness.  You have a fever.  Your body is producing less urine.  You have pain or bleeding when you urinate. Summary  Acute kidney injury is a sudden worsening of kidney function.  Acute kidney injury can be caused by problems with blood flow to the kidneys, direct damage to the kidneys, and sudden blockage of urine flow.  Symptoms of this condition may not be obvious until it becomes severe. Symptoms may include edema, lethargy, confusion, nausea or vomiting, and problems passing urine.  This condition can usually be  diagnosed with blood tests, urine tests, and imaging tests. Sometimes a kidney biopsy is done to diagnose this condition.  Treatment for this condition often involves treating the underlying cause. It is treated with fluids, medicines, dialysis, diet changes, or surgery. This information is not intended to replace advice given to you by your health care provider. Make sure you discuss any questions you have with your health care provider. Document Released: 05/29/2011 Document Revised: 11/03/2016 Document Reviewed: 11/03/2016 Elsevier Interactive Patient Education  2017 [employer].

## 2017-06-12 LAB — HEMOGLOBIN A1C
Hgb A1c MFr Bld: 6.8 % — ABNORMAL HIGH (ref 4.8–5.6)
Mean Plasma Glucose: 148 mg/dL

## 2017-06-15 NOTE — Discharge Summary (Signed)
Lindsey [name], is a 56 y.o. female  DOB 1961/04/09  MRN [id number].  Admission date:  06/08/2017  Admitting Physician  Harrie Foreman, MD  Discharge Date:  06/10/2017   Primary MD  Jodi Marble, MD  Recommendations for primary care physician for things to follow:  Follow upw with PCP in one week   Admission Diagnosis  Chronic pain of right knee [M25.561, G89.29] Osteoarthritis, unspecified osteoarthritis type, unspecified site [M19.90] Acute renal failure, unspecified acute renal failure type [facility]) [N17.9]   Discharge Diagnosis  Chronic pain of right knee [M25.561, G89.29] Osteoarthritis, unspecified osteoarthritis type, unspecified site [M19.90] Acute renal failure, unspecified acute renal failure type ([place]) [N17.9]    Active Problems:   Acute-on-chronic kidney injury [facility])      Past Medical History:  Diagnosis Date  . Allergy   . Anemia   . Arthritis    knees  . COPD (chronic obstructive pulmonary disease) ([place])   . Diabetes mellitus without complication (HCC)    diet controlled  . GERD (gastroesophageal reflux disease)   . Heart attack ([place]) 2002  . Hyperlipidemia   . Hypertension   . PUD (peptic ulcer disease)   . Sleep apnea    can't tolerate CPAP.  Uses O2 only  . Spinal cord stimulator status    for lower back pain  . Stroke [facility]) 2004   "mini-stroke"  . Vertigo   . Wears dentures    full upper and lower    Past Surgical History:  Procedure Laterality Date  . ABDOMINAL HYSTERECTOMY    . back surgery    . broken wrist    . COLONOSCOPY WITH PROPOFOL N/A 01/30/2017   Procedure: COLONOSCOPY WITH PROPOFOL;  Surgeon: Jonathon Bellows, MD;  Location: [facility] ENDOSCOPY;  Service: Endoscopy;  Laterality: N/A;  . COLONOSCOPY WITH PROPOFOL N/A 02/20/2017   Procedure: Colonoscopy with propofol ;   Surgeon: Jonathon Bellows, MD;  Location: [facility] ENDOSCOPY;  Service: Endoscopy;  Laterality: N/A;  . COLONOSCOPY WITH PROPOFOL N/A 03/13/2017   Procedure: COLONOSCOPY WITH PROPOFOL;  Surgeon: Jonathon Bellows, MD;  Location: [facility] ENDOSCOPY;  Service: Endoscopy;  Laterality: N/A;  . COLONOSCOPY WITH PROPOFOL N/A 04/17/2017   Procedure: COLONOSCOPY WITH PROPOFOL;  Surgeon: Jonathon Bellows, MD;  Location: [facility] ENDOSCOPY;  Service: Endoscopy;  Laterality: N/A;  . ESOPHAGOGASTRODUODENOSCOPY (EGD) WITH PROPOFOL N/A 09/28/2016   Procedure: ESOPHAGOGASTRODUODENOSCOPY (EGD) WITH PROPOFOL;  Surgeon: Manya Silvas, MD;  Location: [facility] ENDOSCOPY;  Service: Endoscopy;  Laterality: N/A;  . ESOPHAGOGASTRODUODENOSCOPY (EGD) WITH PROPOFOL N/A 03/13/2017   Procedure: ESOPHAGOGASTRODUODENOSCOPY (EGD) WITH PROPOFOL;  Surgeon: Jonathon Bellows, MD;  Location: [facility] ENDOSCOPY;  Service: Endoscopy;  Laterality: N/A;  . GALLBLADDER SURGERY    . GASTRIC BYPASS    . GIVENS CAPSULE STUDY N/A 03/13/2017   Procedure: GIVENS CAPSULE STUDY;  Surgeon: Jonathon Bellows, MD;  Location: [facility] ENDOSCOPY;  Service: Endoscopy;  Laterality: N/A;  . HERNIA REPAIR    . JOINT REPLACEMENT  1995  . REPLACEMENT TOTAL KNEE    . ROTATOR CUFF REPAIR         History of present illness and  Hospital Course:     Kindly see H&P for history of present illness and admission details, please review complete Labs, Consult reports and Test reports for all details in brief  HPI  from the history and physical done on the day of admission 56 yr old female admitted for dehydration ,pleaselook at history and physical for full details.   Hospital Course  1. Acute on chronic kidney disease: Secondary to dehydration. Improved with IV  Hydration. Seen by nephrology.Duretics were stopped.stopped  Lasix and losartan/HCTZ  At discharge .advised pt to follow with PCP./nephro regarding repeat labs.pt had no nausea or dizziness after she received fluids.she felt better at discharge,   2.  Diabetes mellitus type 2: Continue Tradjenta. Sliding-scale insulin  While in hospital.. 3. Hypertension: Controlled; continue clonidine,  metoprolol  4. Chronic low back pain: The patient has a spinal stimulator in place. Continue oral pain medication per home regimen including fentanyl patches. 5. Depression/chronic pain syndrome: Continue home meds   6.right knee pain and swelling;seen by ortho Dr.Menz,xray showed right knee OA and swelling ,intra articular steroid is given before discharge.     Discharge Condition:stable   Follow UP  Follow-up Information    Jodi Marble, MD Follow up in 1 week(s).   Specialty:  Internal Medicine Why:  Please call to schedule a follow up appointment  Contact information: [place] [address] [phone]             Discharge Instructions  and  Discharge Medications     Allergies as of 06/10/2017      Reactions   Morphine Hives   Iodinated Diagnostic Agents Hives   Aspirin Hives   Noted on MD progress notes and discussed with MD 09/02/15   Etodolac Hives   Ibuprofen Hives   Shellfish Allergy Hives   Tylenol [acetaminophen] Hives   Upset stomach       Medication List    STOP taking these medications   furosemide 20 MG tablet Commonly known as:  LASIX   l-methylfolate-B6-B12 3-35-2 MG Tabs tablet Commonly known as:  METANX   olmesartan-hydrochlorothiazide 40-12.5 MG tablet Commonly known as:  BENICAR HCT   Phendimetrazine Tartrate 35 MG Tabs     TAKE these medications   albuterol 108 (90 Base) MCG/ACT inhaler Commonly known as:  PROVENTIL HFA;VENTOLIN HFA Inhale 2 puffs into the lungs every 4 (four) hours as needed for wheezing or shortness of breath.   albuterol (2.5 MG/3ML) 0.083% nebulizer solution Commonly known as:  PROVENTIL Take 2.5 mg by nebulization every 6 (six) hours as needed for wheezing or shortness of breath.   ALPRAZolam 1 MG tablet Commonly known as:  XANAX Take 1-1.5 mg  by mouth 2 (two) times daily. Pt takes one tablet in the morning and one and one-half tablet at night.   ALPRAZolam 0.25 MG tablet Commonly known as:  XANAX Take 0.25 mg by mouth 3 (three) times daily as needed for anxiety.   baclofen 10 MG tablet Commonly known as:  LIORESAL Take 10 mg by mouth 3 (three) times daily.   Calcium Lactate 648 MG Tabs Take 648 mg by mouth 2 (two) times daily.   cloNIDine 0.1 MG tablet Commonly known as:  CATAPRES Take 0.1 mg by mouth at bedtime.   desvenlafaxine 100 MG 24 hr tablet Commonly known as:  PRISTIQ Take 100 mg by mouth daily. Pt takes 100 mg tab and 50 mg tab for total of 150 mg   desvenlafaxine 50 MG 24 hr tablet Commonly known as:  PRISTIQ Take 50 mg by mouth daily. Pt takes 100 mg tab and 50 mg tab for total of 150 mg   diazepam 5 MG tablet Commonly known as:  VALIUM To be taken 3 times daily with Antivert for vertigo as needed   esomeprazole 40 MG capsule Commonly known as:  NEXIUM Take 40 mg  by mouth 2 (two) times daily.   fentaNYL 25 MCG/HR patch Commonly known as:  DURAGESIC - dosed mcg/hr Place 25 mcg onto the skin every 3 (three) days.   fentaNYL 12 MCG/HR Commonly known as:  DURAGESIC - dosed mcg/hr Place 12 mcg onto the skin every 3 (three) days.   ferrous sulfate 325 (65 FE) MG tablet Take 1 tablet (325 mg total) by mouth 2 (two) times daily with a meal.   fexofenadine 180 MG tablet Commonly known as:  ALLEGRA   fluticasone 50 MCG/ACT nasal spray Commonly known as:  FLONASE Place 1 spray into both nostrils 2 (two) times daily.   folic acid 1 MG tablet Commonly known as:  FOLVITE Take 1 mg by mouth daily.   hydrOXYzine 25 MG tablet Commonly known as:  ATARAX/VISTARIL Take 25 mg by mouth 3 (three) times daily.   meclizine 25 MG tablet Commonly known as:  ANTIVERT Take 1 tablet (25 mg total) by mouth 3 (three) times daily as needed for dizziness or nausea.   metoprolol succinate 100 MG 24 hr  tablet Commonly known as:  TOPROL-XL Take 100 mg by mouth daily. Take with or immediately following a meal.   mirtazapine 45 MG tablet Commonly known as:  REMERON Take 45 mg by mouth at bedtime.   NITROSTAT 0.3 MG SL tablet Generic drug:  nitroGLYCERIN Place under the tongue.   ONE TOUCH ULTRA TEST test strip Generic drug:  glucose blood   ONGLYZA 5 MG Tabs tablet Generic drug:  saxagliptin HCl Take 1 tablet by mouth daily.   oxybutynin 5 MG tablet Commonly known as:  DITROPAN Take 5 mg by mouth 2 (two) times daily.   oxyCODONE 5 MG immediate release tablet Commonly known as:  Oxy IR/ROXICODONE Take 5 mg by mouth every 6 (six) hours as needed for severe pain.   OYSTER SHELL CALCIUM 500 + D PO   potassium chloride 10 MEQ tablet Commonly known as:  K-DUR Take 10 mEq by mouth daily.   simvastatin 40 MG tablet Commonly known as:  ZOCOR Take 1 tablet by mouth daily.   sucralfate 1 g tablet Commonly known as:  CARAFATE Take 1 g by mouth 4 (four) times daily.   tamsulosin 0.4 MG Caps capsule Commonly known as:  FLOMAX Take 0.4 mg by mouth daily.   Vitamin D (Ergocalciferol) 50000 units Caps capsule Commonly known as:  DRISDOL Take 1 capsule (50,000 Units total) by mouth every 7 (seven) days.   zolpidem 10 MG tablet Commonly known as:  AMBIEN Take 10 mg by mouth at bedtime as needed for sleep.         Diet and Activity recommendation: See Discharge Instructions above   Consults obtained -nephro,Ortho   Major procedures and Radiology Reports - PLEASE review detailed and final reports for all details, in brief -     Dg Knee Complete 4 Views Right  Result Date: 06/08/2017 CLINICAL DATA:  Lateral RIGHT knee pain for 4 days, fell out of bed Wednesday but had pain prior to that as well EXAM: RIGHT KNEE - COMPLETE 4+ VIEW COMPARISON:  03/06/2010 FINDINGS: Osseous demineralization. Mild joint space narrowing and spur formation greatest at patellofemoral joint. No  acute fracture, dislocation, or bone destruction. No knee joint effusion. IMPRESSION: Osteoarthritic changes RIGHT knee greatest at patellofemoral joint. Electronically Signed   By: Lavonia Dana M.D.   On: 06/08/2017 20:54    Micro Results     No results found for this or any  previous visit (from the past 240 hour(s)).     Today   Subjective:   Vevelyn Pat today has no nausea or vomiting ,stable for discharge  Objective:   Blood pressure (!) 104/57, pulse 63, temperature 98.3 F (36.8 C), temperature source Oral, resp. rate 18, height 5\' 7"  (1.702 m), weight (!) 140.9 kg (310 lb 10.8 oz), SpO2 99 %.  No intake or output data in the 24 hours ending 06/15/17 1648  Exam Awake Alert, Oriented x 3, No new F.N deficits, Normal affect [place].AT,PERRAL Supple Neck,No JVD, No cervical lymphadenopathy appriciated.  Symmetrical Chest wall movement, Good air movement bilaterally, CTAB RRR,No Gallops,Rubs or new Murmurs, No Parasternal Heave +ve B.Sounds, Abd Soft, Non tender, No organomegaly appriciated, No rebound -guarding or rigidity. No Cyanosis, Clubbing or edema, No new Rash or bruise Right knee swelling and ambulatory difficulties for a while,s/p steroid injection Data Review   CBC w Diff:  Lab Results  Component Value Date   WBC 9.7 06/08/2017   HGB 12.4 06/08/2017   HGB 9.5 (L) 06/30/2014   HCT 37.2 06/08/2017   HCT 30.4 (L) 06/30/2014   PLT 331 06/08/2017   PLT 412 06/30/2014   LYMPHOPCT 44 01/23/2017   LYMPHOPCT 28.4 06/30/2014   MONOPCT 7 01/23/2017   MONOPCT 8.9 06/30/2014   EOSPCT 2 01/23/2017   EOSPCT 1.9 06/30/2014   BASOPCT 1 01/23/2017   BASOPCT 0.6 06/30/2014    CMP:  Lab Results  Component Value Date   NA 141 06/09/2017   NA 145 07/01/2014   K 3.2 (L) 06/09/2017   K 5.1 07/01/2014   CL 110 06/09/2017   CL 119 (H) 07/01/2014   CO2 24 06/09/2017   CO2 16 (L) 07/01/2014   BUN 43 (H) 06/09/2017   BUN 18 07/01/2014   CREATININE 2.11 (H)  06/09/2017   CREATININE 1.22 07/01/2014   PROT 7.5 06/08/2017   PROT 7.0 07/01/2014   ALBUMIN 3.8 06/08/2017   ALBUMIN 2.5 (L) 07/01/2014   BILITOT 0.7 06/08/2017   BILITOT 0.7 07/01/2014   ALKPHOS 113 06/08/2017   ALKPHOS 107 07/01/2014   AST 21 06/08/2017   AST 48 (H) 07/01/2014   ALT 16 06/08/2017   ALT 22 07/01/2014  .   Total Time in preparing paper work, data evaluation and todays exam - 13 minutes  Tonye Tancredi M.D on 06/10/2017 at 4:48 PM    Note: This dictation was prepared with Dragon dictation along with smaller phrase technology. Any transcriptional errors that result from this process are unintentional.

## 2017-08-02 ENCOUNTER — Ambulatory Visit: Payer: Medicare Other | Admitting: Gastroenterology

## 2017-10-25 ENCOUNTER — Other Ambulatory Visit: Payer: Self-pay | Admitting: Internal Medicine

## 2017-10-25 DIAGNOSIS — Z1231 Encounter for screening mammogram for malignant neoplasm of breast: Secondary | ICD-10-CM

## 2017-12-18 ENCOUNTER — Ambulatory Visit
Admission: RE | Admit: 2017-12-18 | Discharge: 2017-12-18 | Disposition: A | Discharge status: Home / Self Care | Payer: Medicare Other | Source: Ambulatory Visit | Attending: Internal Medicine | Admitting: Internal Medicine

## 2017-12-18 DIAGNOSIS — Z1231 Encounter for screening mammogram for malignant neoplasm of breast: Secondary | ICD-10-CM | POA: Insufficient documentation

## 2018-08-17 ENCOUNTER — Emergency Department
Admission: EM | Admit: 2018-08-17 | Discharge: 2018-08-17 | Disposition: A | Discharge status: Home / Self Care | Payer: Medicare Other | Attending: Emergency Medicine | Admitting: Emergency Medicine

## 2018-08-17 ENCOUNTER — Other Ambulatory Visit: Payer: Self-pay

## 2018-08-17 ENCOUNTER — Emergency Department: Payer: Medicare Other

## 2018-08-17 ENCOUNTER — Encounter: Payer: Self-pay | Admitting: Emergency Medicine

## 2018-08-17 DIAGNOSIS — Z96659 Presence of unspecified artificial knee joint: Secondary | ICD-10-CM | POA: Insufficient documentation

## 2018-08-17 DIAGNOSIS — Y998 Other external cause status: Secondary | ICD-10-CM | POA: Insufficient documentation

## 2018-08-17 DIAGNOSIS — Y92410 Unspecified street and highway as the place of occurrence of the external cause: Secondary | ICD-10-CM | POA: Insufficient documentation

## 2018-08-17 DIAGNOSIS — Z79899 Other long term (current) drug therapy: Secondary | ICD-10-CM | POA: Insufficient documentation

## 2018-08-17 DIAGNOSIS — M545 Low back pain: Secondary | ICD-10-CM | POA: Diagnosis present

## 2018-08-17 DIAGNOSIS — Z8673 Personal history of transient ischemic attack (TIA), and cerebral infarction without residual deficits: Secondary | ICD-10-CM | POA: Insufficient documentation

## 2018-08-17 DIAGNOSIS — M7918 Myalgia, other site: Secondary | ICD-10-CM | POA: Diagnosis not present

## 2018-08-17 DIAGNOSIS — Y9389 Activity, other specified: Secondary | ICD-10-CM | POA: Insufficient documentation

## 2018-08-17 DIAGNOSIS — E119 Type 2 diabetes mellitus without complications: Secondary | ICD-10-CM | POA: Insufficient documentation

## 2018-08-17 DIAGNOSIS — J449 Chronic obstructive pulmonary disease, unspecified: Secondary | ICD-10-CM | POA: Insufficient documentation

## 2018-08-17 MED ORDER — TRAMADOL HCL 50 MG PO TABS: 50.0000 mg | ORAL_TABLET | Freq: Four times a day (QID) | ORAL | 0 refills | Status: AC | PRN

## 2018-08-17 MED ORDER — CYCLOBENZAPRINE HCL 10 MG PO TABS: 10.0000 mg | ORAL_TABLET | Freq: Three times a day (TID) | ORAL | 0 refills | Status: DC | PRN

## 2018-08-17 MED ORDER — CYCLOBENZAPRINE HCL 10 MG PO TABS
10.0000 mg | ORAL_TABLET | Freq: Once | ORAL | Status: AC
  Administered 2018-08-17: 10 mg via ORAL
  Filled 2018-08-17: qty 1

## 2018-08-17 MED ORDER — TRAMADOL HCL 50 MG PO TABS
50.0000 mg | ORAL_TABLET | Freq: Once | ORAL | Status: AC
  Administered 2018-08-17: 50 mg via ORAL
  Filled 2018-08-17: qty 1
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
## 2018-08-17 NOTE — Discharge Instructions (Signed)
Lumbar x-ray shows no acute findings.  Continue previous medication and start tramadol and Flexeril as directed.
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
## 2018-10-06 IMAGING — MG MM DIGITAL SCREENING BILAT W/ CAD
8 series · 8 of 8 positions shown · non-contrast
Comparison: Previous exam(s).

ACR Breast Density Category a: The breast tissue is almost entirely
fatty.

CLINICAL DATA: Screening.

EXAM:
DIGITAL SCREENING BILATERAL MAMMOGRAM WITH CAD

[R MLO (1 of 2)]
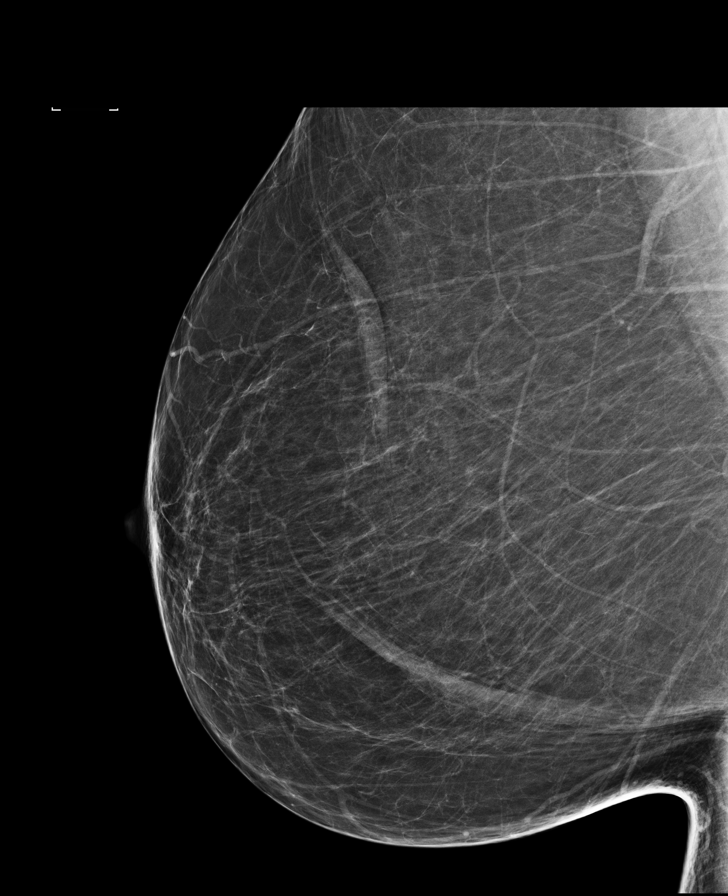

[L MLO (1 of 2)]
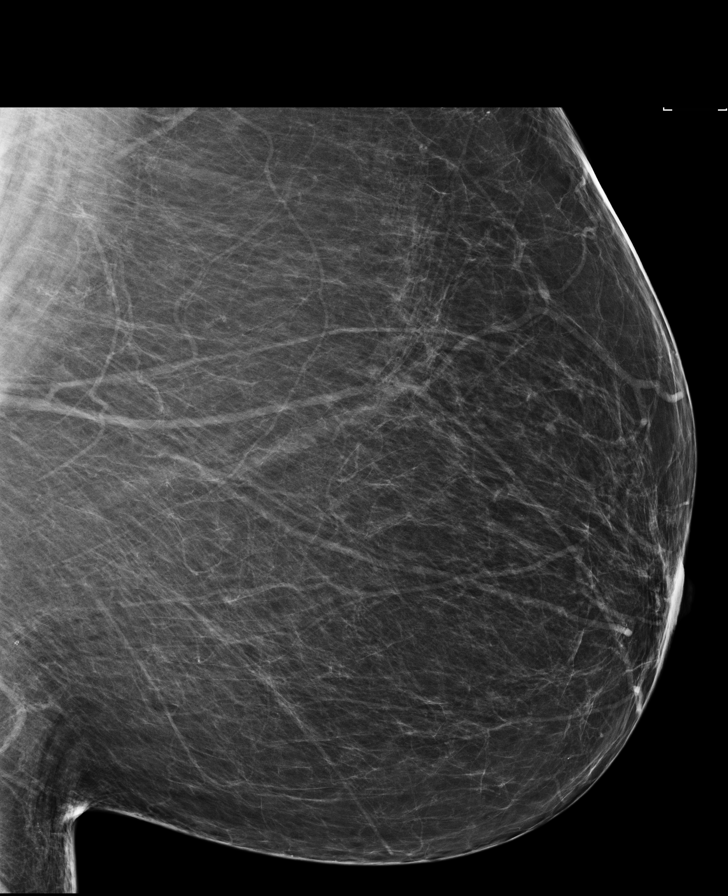

[R CC (1 of 2)]
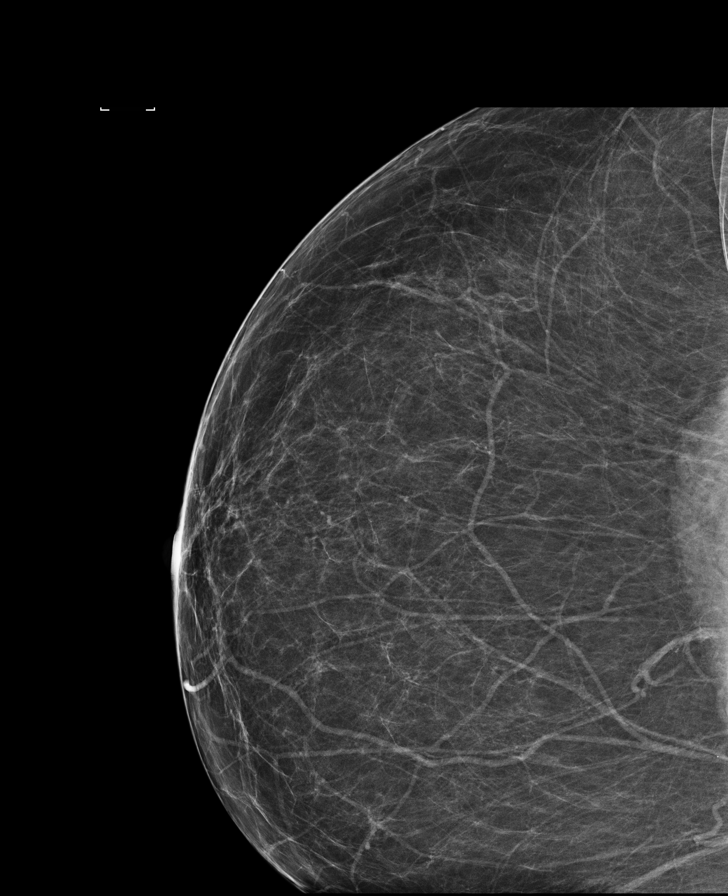

[R CC (2 of 2)]
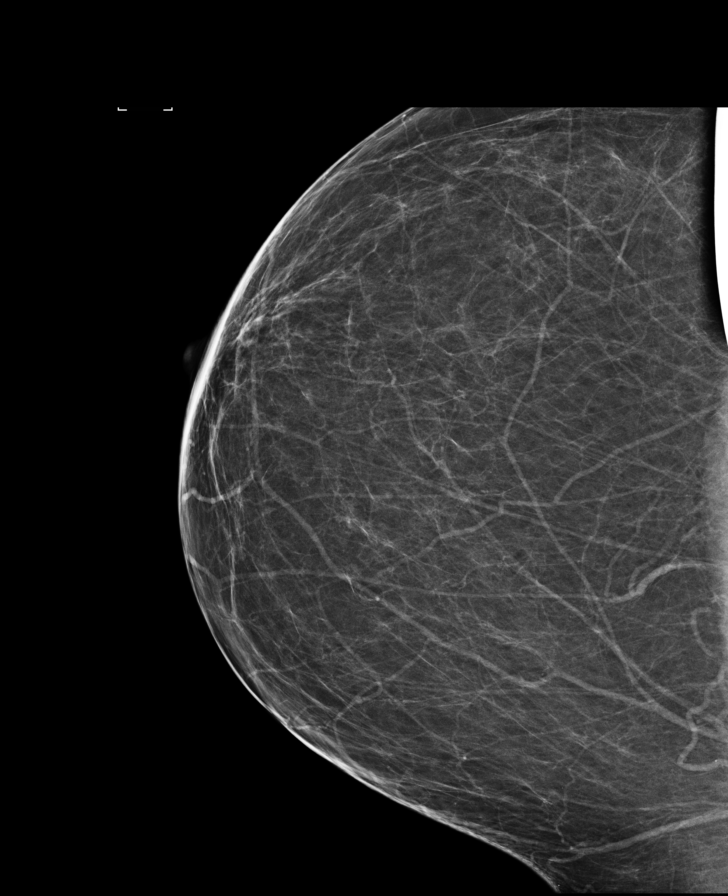

[R MLO (2 of 2)]
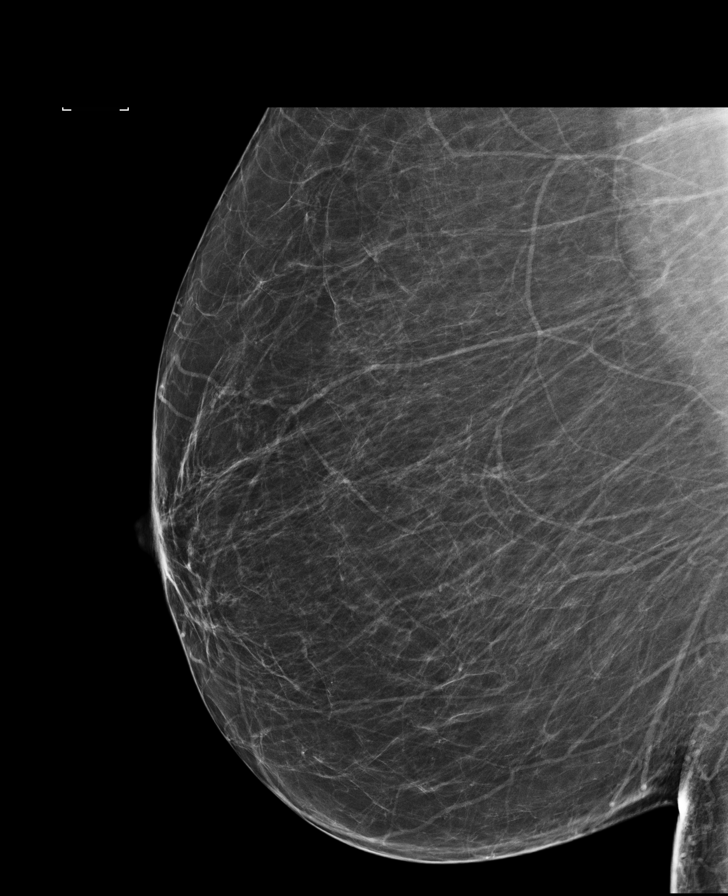

[L CC (1 of 2)]
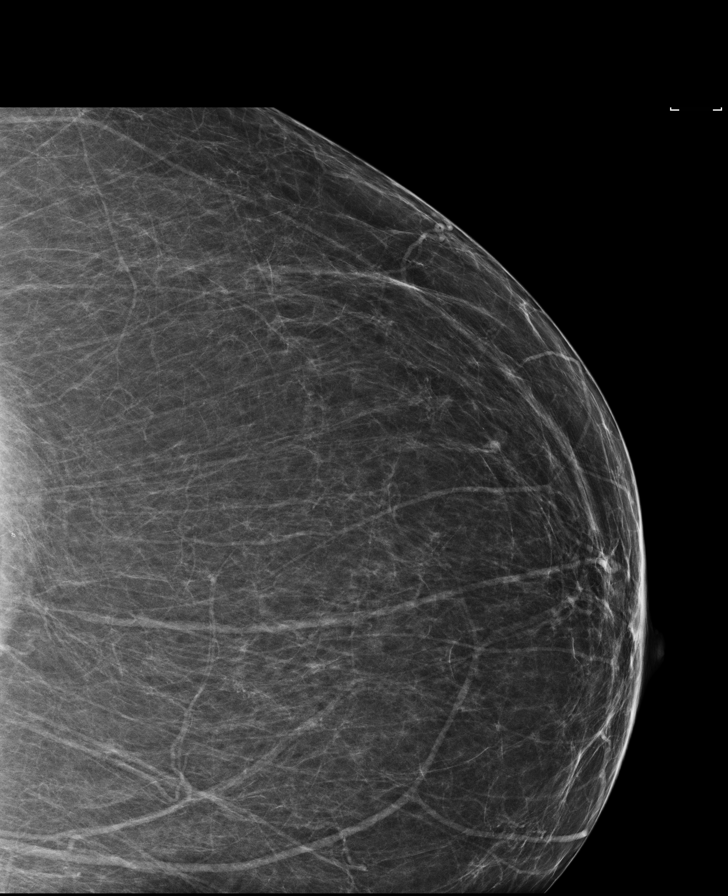

[L MLO (2 of 2)]
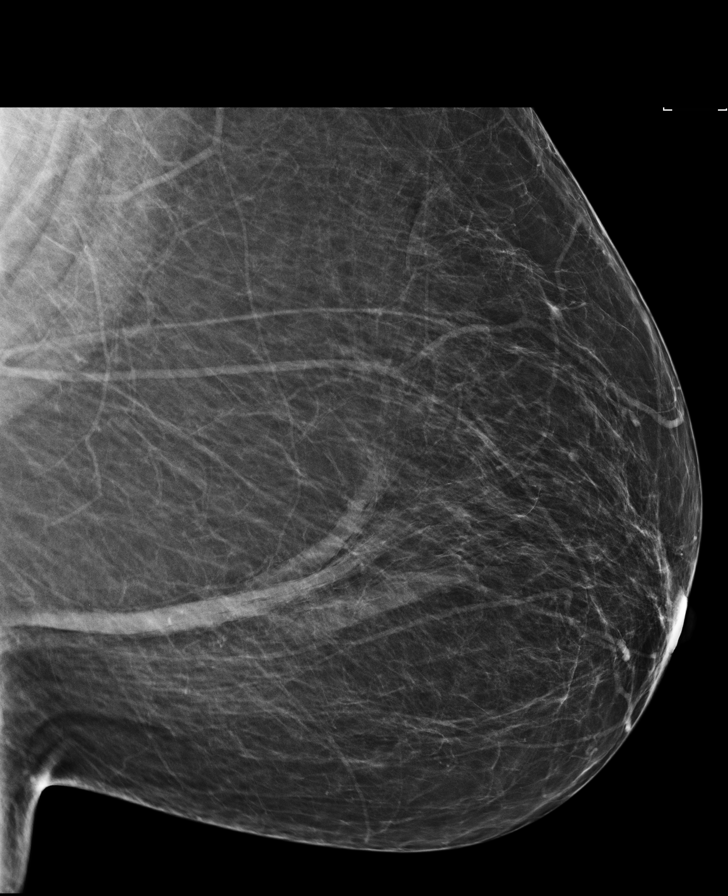

[L CC (2 of 2)]
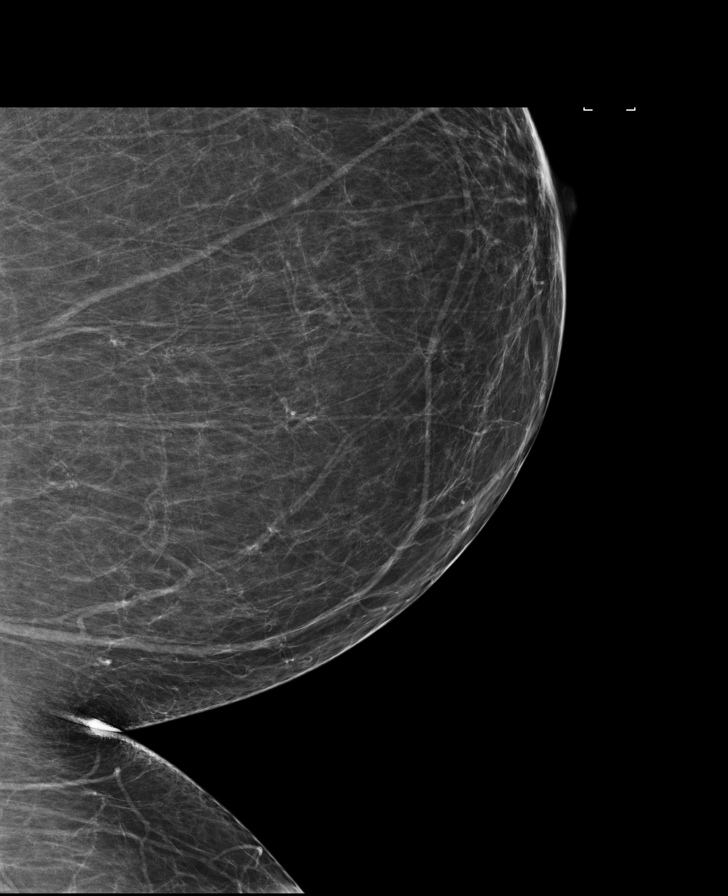

[8 of 8 positions shown; findings below may reference images not displayed]

FINDINGS: There are no findings suspicious for malignancy. Images were
processed with CAD.
IMPRESSION: No mammographic evidence of malignancy. A result letter of this
screening mammogram will be mailed directly to the patient.

RECOMMENDATION:
Screening mammogram in one year. (Code:[id number])

BI-RADS CATEGORY  1: Negative.

## 2018-11-08 ENCOUNTER — Other Ambulatory Visit: Payer: Self-pay | Admitting: Internal Medicine

## 2018-11-08 DIAGNOSIS — Z1231 Encounter for screening mammogram for malignant neoplasm of breast: Secondary | ICD-10-CM

## 2019-01-09 ENCOUNTER — Ambulatory Visit: Payer: Medicare Other | Admitting: Student in an Organized Health Care Education/Training Program

## 2019-09-02 ENCOUNTER — Other Ambulatory Visit: Payer: Self-pay

## 2019-09-02 DIAGNOSIS — Z20822 Contact with and (suspected) exposure to covid-19: Secondary | ICD-10-CM

## 2019-09-04 LAB — NOVEL CORONAVIRUS, NAA: SARS-CoV-2, NAA: NOT DETECTED

## 2019-09-06 ENCOUNTER — Telehealth: Payer: Self-pay | Admitting: *Deleted

## 2019-09-06 NOTE — Telephone Encounter (Signed)
Pt returned call to get covid-19 test results. She is advised that she is negative. She voiced understanding.

## 2020-05-04 ENCOUNTER — Other Ambulatory Visit: Payer: Self-pay

## 2020-05-04 ENCOUNTER — Emergency Department: Payer: Medicare Other

## 2020-05-04 ENCOUNTER — Inpatient Hospital Stay
Admission: EM | Admit: 2020-05-04 | Discharge: 2020-05-07 | DRG: 378 | Discharge status: Against Medical Advice | Payer: Medicare Other | Attending: Internal Medicine | Admitting: Internal Medicine

## 2020-05-04 ENCOUNTER — Encounter: Payer: Self-pay | Admitting: Emergency Medicine

## 2020-05-04 DIAGNOSIS — N390 Urinary tract infection, site not specified: Secondary | ICD-10-CM | POA: Diagnosis present

## 2020-05-04 DIAGNOSIS — J449 Chronic obstructive pulmonary disease, unspecified: Secondary | ICD-10-CM | POA: Diagnosis present

## 2020-05-04 DIAGNOSIS — R000 Tachycardia, unspecified: Secondary | ICD-10-CM | POA: Diagnosis not present

## 2020-05-04 DIAGNOSIS — Z91013 Allergy to seafood: Secondary | ICD-10-CM

## 2020-05-04 DIAGNOSIS — Z8249 Family history of ischemic heart disease and other diseases of the circulatory system: Secondary | ICD-10-CM

## 2020-05-04 DIAGNOSIS — N184 Chronic kidney disease, stage 4 (severe): Secondary | ICD-10-CM | POA: Diagnosis present

## 2020-05-04 DIAGNOSIS — F329 Major depressive disorder, single episode, unspecified: Secondary | ICD-10-CM | POA: Diagnosis present

## 2020-05-04 DIAGNOSIS — R7303 Prediabetes: Secondary | ICD-10-CM

## 2020-05-04 DIAGNOSIS — Z9884 Bariatric surgery status: Secondary | ICD-10-CM | POA: Diagnosis not present

## 2020-05-04 DIAGNOSIS — E1122 Type 2 diabetes mellitus with diabetic chronic kidney disease: Secondary | ICD-10-CM | POA: Diagnosis present

## 2020-05-04 DIAGNOSIS — I129 Hypertensive chronic kidney disease with stage 1 through stage 4 chronic kidney disease, or unspecified chronic kidney disease: Secondary | ICD-10-CM | POA: Diagnosis present

## 2020-05-04 DIAGNOSIS — N179 Acute kidney failure, unspecified: Secondary | ICD-10-CM | POA: Diagnosis present

## 2020-05-04 DIAGNOSIS — K921 Melena: Secondary | ICD-10-CM | POA: Diagnosis present

## 2020-05-04 DIAGNOSIS — R1314 Dysphagia, pharyngoesophageal phase: Secondary | ICD-10-CM | POA: Diagnosis present

## 2020-05-04 DIAGNOSIS — Z825 Family history of asthma and other chronic lower respiratory diseases: Secondary | ICD-10-CM | POA: Diagnosis not present

## 2020-05-04 DIAGNOSIS — D509 Iron deficiency anemia, unspecified: Secondary | ICD-10-CM | POA: Diagnosis present

## 2020-05-04 DIAGNOSIS — R7989 Other specified abnormal findings of blood chemistry: Secondary | ICD-10-CM | POA: Diagnosis not present

## 2020-05-04 DIAGNOSIS — I959 Hypotension, unspecified: Secondary | ICD-10-CM | POA: Diagnosis not present

## 2020-05-04 DIAGNOSIS — R1013 Epigastric pain: Secondary | ICD-10-CM | POA: Diagnosis present

## 2020-05-04 DIAGNOSIS — Z20822 Contact with and (suspected) exposure to covid-19: Secondary | ICD-10-CM | POA: Diagnosis present

## 2020-05-04 DIAGNOSIS — D62 Acute posthemorrhagic anemia: Secondary | ICD-10-CM | POA: Diagnosis present

## 2020-05-04 DIAGNOSIS — Z96659 Presence of unspecified artificial knee joint: Secondary | ICD-10-CM | POA: Diagnosis present

## 2020-05-04 DIAGNOSIS — I252 Old myocardial infarction: Secondary | ICD-10-CM

## 2020-05-04 DIAGNOSIS — Z6839 Body mass index (BMI) 39.0-39.9, adult: Secondary | ICD-10-CM

## 2020-05-04 DIAGNOSIS — K2100 Gastro-esophageal reflux disease with esophagitis, without bleeding: Secondary | ICD-10-CM | POA: Diagnosis present

## 2020-05-04 DIAGNOSIS — K922 Gastrointestinal hemorrhage, unspecified: Secondary | ICD-10-CM | POA: Diagnosis present

## 2020-05-04 DIAGNOSIS — Z5329 Procedure and treatment not carried out because of patient's decision for other reasons: Secondary | ICD-10-CM | POA: Diagnosis present

## 2020-05-04 DIAGNOSIS — G4733 Obstructive sleep apnea (adult) (pediatric): Secondary | ICD-10-CM | POA: Diagnosis present

## 2020-05-04 DIAGNOSIS — E785 Hyperlipidemia, unspecified: Secondary | ICD-10-CM | POA: Diagnosis present

## 2020-05-04 DIAGNOSIS — Z886 Allergy status to analgesic agent status: Secondary | ICD-10-CM

## 2020-05-04 DIAGNOSIS — Z888 Allergy status to other drugs, medicaments and biological substances status: Secondary | ICD-10-CM

## 2020-05-04 DIAGNOSIS — Z8711 Personal history of peptic ulcer disease: Secondary | ICD-10-CM

## 2020-05-04 DIAGNOSIS — N189 Chronic kidney disease, unspecified: Secondary | ICD-10-CM | POA: Diagnosis present

## 2020-05-04 DIAGNOSIS — Z885 Allergy status to narcotic agent status: Secondary | ICD-10-CM

## 2020-05-04 DIAGNOSIS — Z8673 Personal history of transient ischemic attack (TIA), and cerebral infarction without residual deficits: Secondary | ICD-10-CM

## 2020-05-04 DIAGNOSIS — Z91041 Radiographic dye allergy status: Secondary | ICD-10-CM

## 2020-05-04 DIAGNOSIS — Z79899 Other long term (current) drug therapy: Secondary | ICD-10-CM

## 2020-05-04 DIAGNOSIS — E1165 Type 2 diabetes mellitus with hyperglycemia: Secondary | ICD-10-CM | POA: Diagnosis not present

## 2020-05-04 LAB — CBC WITH DIFFERENTIAL/PLATELET
Abs Immature Granulocytes: 0.13 10*3/uL — ABNORMAL HIGH (ref 0.00–0.07)
Basophils Absolute: 0.1 10*3/uL (ref 0.0–0.1)
Basophils Relative: 0 %
Eosinophils Absolute: 0.1 10*3/uL (ref 0.0–0.5)
Eosinophils Relative: 0 %
HCT: 38.4 % (ref 36.0–46.0)
Hemoglobin: 12.3 g/dL (ref 12.0–15.0)
Immature Granulocytes: 1 %
Lymphocytes Relative: 12 %
Lymphs Abs: 1.9 10*3/uL (ref 0.7–4.0)
MCH: 27.5 pg (ref 26.0–34.0)
MCHC: 32 g/dL (ref 30.0–36.0)
MCV: 85.7 fL (ref 80.0–100.0)
Monocytes Absolute: 0.7 10*3/uL (ref 0.1–1.0)
Monocytes Relative: 5 %
Neutro Abs: 13.7 10*3/uL — ABNORMAL HIGH (ref 1.7–7.7)
Neutrophils Relative %: 82 %
Platelets: 333 10*3/uL (ref 150–400)
RBC: 4.48 MIL/uL (ref 3.87–5.11)
RDW: 15.2 % (ref 11.5–15.5)
WBC: 16.6 10*3/uL — ABNORMAL HIGH (ref 4.0–10.5)
nRBC: 0 % (ref 0.0–0.2)

## 2020-05-04 LAB — CBC
HCT: 36.8 % (ref 36.0–46.0)
Hemoglobin: 11.6 g/dL — ABNORMAL LOW (ref 12.0–15.0)
MCH: 27.8 pg (ref 26.0–34.0)
MCHC: 31.5 g/dL (ref 30.0–36.0)
MCV: 88 fL (ref 80.0–100.0)
Platelets: 285 10*3/uL (ref 150–400)
RBC: 4.18 MIL/uL (ref 3.87–5.11)
RDW: 15.3 % (ref 11.5–15.5)
WBC: 18 10*3/uL — ABNORMAL HIGH (ref 4.0–10.5)
nRBC: 0 % (ref 0.0–0.2)

## 2020-05-04 LAB — PROTIME-INR
INR: 1 (ref 0.8–1.2)
INR: 1 (ref 0.8–1.2)
Prothrombin Time: 13 seconds (ref 11.4–15.2)
Prothrombin Time: 12.8 seconds (ref 11.4–15.2)

## 2020-05-04 LAB — COMPREHENSIVE METABOLIC PANEL
ALT: 21 U/L (ref 0–44)
AST: 22 U/L (ref 15–41)
Albumin: 3.6 g/dL (ref 3.5–5.0)
Alkaline Phosphatase: 138 U/L — ABNORMAL HIGH (ref 38–126)
Anion gap: 11 (ref 5–15)
BUN: 81 mg/dL — ABNORMAL HIGH (ref 8–23)
CO2: 19 mmol/L — ABNORMAL LOW (ref 22–32)
Calcium: 9 mg/dL (ref 8.9–10.3)
Chloride: 109 mmol/L (ref 98–111)
Creatinine, Ser: 4.41 mg/dL — ABNORMAL HIGH (ref 0.44–1.00)
GFR calc Af Amer: 12 mL/min — ABNORMAL LOW (ref >60)
GFR calc non Af Amer: 10 mL/min — ABNORMAL LOW (ref >60)
Glucose, Bld: 164 mg/dL — ABNORMAL HIGH (ref 70–99)
Potassium: 4.9 mmol/L (ref 3.5–5.1)
Sodium: 139 mmol/L (ref 135–145)
Total Bilirubin: 0.8 mg/dL (ref 0.3–1.2)
Total Protein: 7.4 g/dL (ref 6.5–8.1)

## 2020-05-04 LAB — TYPE AND SCREEN
ABO/RH(D): A POS
Antibody Screen: NEGATIVE

## 2020-05-04 LAB — SARS CORONAVIRUS 2 BY RT PCR (HOSPITAL ORDER, PERFORMED IN ~~LOC~~ HOSPITAL LAB): SARS Coronavirus 2: NEGATIVE

## 2020-05-04 MED ORDER — IPRATROPIUM BROMIDE 0.02 % IN SOLN: 0.5000 mg | Freq: Four times a day (QID) | RESPIRATORY_TRACT | Status: DC | PRN

## 2020-05-04 MED ORDER — ONDANSETRON HCL 4 MG/2ML IJ SOLN
4.0000 mg | Freq: Four times a day (QID) | INTRAMUSCULAR | Status: DC | PRN
  Administered 2020-05-05: 4 mg via INTRAVENOUS

## 2020-05-04 MED ORDER — PANTOPRAZOLE SODIUM 40 MG IV SOLR
40.0000 mg | Freq: Two times a day (BID) | INTRAVENOUS | Status: DC
  Administered 2020-05-05 – 2020-05-07 (×6): 40 mg via INTRAVENOUS
  Filled 2020-05-04 (×6): qty 40

## 2020-05-04 MED ORDER — SENNOSIDES-DOCUSATE SODIUM 8.6-50 MG PO TABS: 1.0000 | ORAL_TABLET | Freq: Every evening | ORAL | Status: DC | PRN

## 2020-05-04 MED ORDER — ALBUTEROL SULFATE (2.5 MG/3ML) 0.083% IN NEBU: 2.5000 mg | INHALATION_SOLUTION | Freq: Four times a day (QID) | RESPIRATORY_TRACT | Status: DC | PRN

## 2020-05-04 MED ORDER — SODIUM CHLORIDE 0.9 % IV BOLUS
1000.0000 mL | Freq: Once | INTRAVENOUS | Status: AC
  Administered 2020-05-04: 1000 mL via INTRAVENOUS

## 2020-05-04 MED ORDER — SODIUM CHLORIDE 0.9 % IV SOLN: Freq: Once | INTRAVENOUS | Status: DC

## 2020-05-04 MED ORDER — PANTOPRAZOLE SODIUM 40 MG IV SOLR
40.0000 mg | Freq: Once | INTRAVENOUS | Status: AC
  Administered 2020-05-04: 40 mg via INTRAVENOUS
  Filled 2020-05-04: qty 40

## 2020-05-04 MED ORDER — ONDANSETRON HCL 4 MG PO TABS: 4.0000 mg | ORAL_TABLET | Freq: Four times a day (QID) | ORAL | Status: DC | PRN

## 2020-05-04 MED ORDER — ZOLPIDEM TARTRATE 5 MG PO TABS
5.0000 mg | ORAL_TABLET | Freq: Every evening | ORAL | Status: DC | PRN
  Administered 2020-05-05 – 2020-05-06 (×2): 5 mg via ORAL
  Filled 2020-05-04 (×2): qty 1

## 2020-05-04 MED ORDER — BISACODYL 5 MG PO TBEC: 5.0000 mg | DELAYED_RELEASE_TABLET | Freq: Every day | ORAL | Status: DC | PRN

## 2020-05-04 MED ORDER — SODIUM CHLORIDE 0.9 % IV SOLN: INTRAVENOUS | Status: DC

## 2020-05-04 MED ORDER — GUAIFENESIN ER 600 MG PO TB12
600.0000 mg | ORAL_TABLET | Freq: Two times a day (BID) | ORAL | Status: DC | PRN
  Filled 2020-05-04: qty 1

## 2020-05-04 NOTE — ED Notes (Signed)
Pt awake and alert   Iv fluids infusing.  Family with pt.

## 2020-05-04 NOTE — ED Provider Notes (Signed)
[facility] Emergency Department Provider Note  ____________________________________________   First MD Initiated Contact with Patient 05/04/20 1638     (approximate)  I have reviewed the triage vital signs and the nursing notes.  History  Chief Complaint Abdominal Pain and Hypotension    HPI Lindsey [name] is a 59 y.o. female with history of gastric bypass, PUD who presents to the emergency department for epigastric abdominal pain and dark black stools.  Symptoms first started several days ago and have been constant since onset, no improvement.  Pain in the epigastrium feels similar to a prior ulcer disease, burning and aching.  Moderate in severity.  Constant.  No radiation.  No alleviating/aggravating components.  States she had similar symptoms of dark black stool when she was initially diagnosed with her peptic ulcer disease.  Has required transfusion in the past.  She is no longer taking any GERD medications.  Reports feeling generally weak and lightheaded.  No syncope.  No vomiting.  Denies any heavy NSAID or alcohol use.  Initial blood pressure in triage 75/50.  Patient states when she checked her blood pressure at home it was as low as systolic 60.   Past Medical Hx Past Medical History:  Diagnosis Date  . Allergy   . Anemia   . Arthritis    knees  . COPD (chronic obstructive pulmonary disease) ([place])   . Diabetes mellitus without complication (HCC)    diet controlled  . GERD (gastroesophageal reflux disease)   . Heart attack ([place]) 2002  . Hyperlipidemia   . Hypertension   . PUD (peptic ulcer disease)   . Sleep apnea    can't tolerate CPAP.  Uses O2 only  . Spinal cord stimulator status    for lower back pain  . Stroke [facility]) 2004   "mini-stroke"  . Vertigo   . Wears dentures    full upper and lower    Problem List Patient Active Problem List   Diagnosis Date Noted  . Acute-on-chronic kidney injury ([place]) 06/09/2017  . Symptomatic  anemia 12/24/2016  . Chest pain 12/23/2016  . Dyspnea 12/23/2016  . Palpitations 12/23/2016  . Hypotension 09/29/2016  . Renal insufficiency 09/29/2016  . Anemia 09/29/2016  . Leukocytosis 09/29/2016  . Prediabetes 09/29/2016  . Jejunal inflammation 09/29/2016  . Abnormal findings on esophagogastroduodenoscopy (EGD) 09/29/2016  . Vitamin D deficiency 09/29/2016  . Syncope 09/27/2016  . Medication overuse headache 11/08/2015  . Morbid obesity with BMI of 45.0-49.9, adult ([place]) 11/08/2015  . Seizures ([place]) 09/02/2015  . Lipodystrophy 10/13/2014  . 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 Disease of female genital organs 04/01/2013  . Difficult or painful urination 03/18/2013  . Urge incontinence 03/18/2013  . Urgency of micturation 03/18/2013    Past Surgical Hx Past Surgical History:  Procedure Laterality Date  . ABDOMINAL HYSTERECTOMY    . back surgery    . broken wrist    . COLONOSCOPY WITH PROPOFOL N/A 01/30/2017   Procedure: COLONOSCOPY WITH PROPOFOL;  Surgeon: Jonathon Bellows, MD;  Location: [facility] ENDOSCOPY;  Service: Endoscopy;  Laterality: N/A;  . COLONOSCOPY WITH PROPOFOL N/A 02/20/2017   Procedure: Colonoscopy with propofol ;  Surgeon: Jonathon Bellows, MD;  Location: [facility] ENDOSCOPY;  Service: Endoscopy;  Laterality: N/A;  . COLONOSCOPY WITH PROPOFOL N/A 03/13/2017   Procedure: COLONOSCOPY WITH PROPOFOL;  Surgeon: Jonathon Bellows, MD;  Location: [facility] ENDOSCOPY;  Service: Endoscopy;  Laterality: N/A;  . COLONOSCOPY WITH PROPOFOL N/A 04/17/2017  Procedure: COLONOSCOPY WITH PROPOFOL;  Surgeon: Jonathon Bellows, MD;  Location[facility] ENDOSCOPY;  Service: Endoscopy;  Laterality: N/A;  . ESOPHAGOGASTRODUODENOSCOPY (EGD) WITH PROPOFOL N/A 09/28/2016   Procedure: ESOPHAGOGASTRODUODENOSCOPY (EGD) WITH PROPOFOL;  Surgeon: Manya Silvas, MD;  Location: [facility] ENDOSCOPY;  Service: Endoscopy;  Laterality: N/A;  . ESOPHAGOGASTRODUODENOSCOPY (EGD) WITH PROPOFOL N/A 03/13/2017    Procedure: ESOPHAGOGASTRODUODENOSCOPY (EGD) WITH PROPOFOL;  Surgeon: Jonathon Bellows, MD;  Location: [facility] ENDOSCOPY;  Service: Endoscopy;  Laterality: N/A;  . GALLBLADDER SURGERY    . GASTRIC BYPASS    . GIVENS CAPSULE STUDY N/A 03/13/2017   Procedure: GIVENS CAPSULE STUDY;  Surgeon: Jonathon Bellows, MD;  Location: [facility] ENDOSCOPY;  Service: Endoscopy;  Laterality: N/A;  . HERNIA REPAIR    . JOINT REPLACEMENT  1995  . REPLACEMENT TOTAL KNEE    . ROTATOR CUFF REPAIR      Medications Prior to Admission medications   Medication Sig Start Date End Date Taking? Authorizing Provider  albuterol (PROVENTIL HFA;VENTOLIN HFA) 108 (90 BASE) MCG/ACT inhaler Inhale 2 puffs into the lungs every 4 (four) hours as needed for wheezing or shortness of breath.    [provider]  albuterol (PROVENTIL) (2.5 MG/3ML) 0.083% nebulizer solution Take 2.5 mg by nebulization every 6 (six) hours as needed for wheezing or shortness of breath.    [provider]  ALPRAZolam Duanne Moron) 0.25 MG tablet Take 0.25 mg by mouth 3 (three) times daily as needed for anxiety.     [provider]  ALPRAZolam Duanne Moron) 1 MG tablet Take 1-1.5 mg by mouth 2 (two) times daily. Pt takes one tablet in the morning and one and one-half tablet at night.    [provider]  baclofen (LIORESAL) 10 MG tablet Take 10 mg by mouth 3 (three) times daily.    [provider]  Calcium Carbonate-Vitamin D (OYSTER SHELL CALCIUM 500 + D PO)  12/26/16   [provider]  Calcium Lactate 648 MG TABS Take 648 mg by mouth 2 (two) times daily.    [provider]  cloNIDine (CATAPRES) 0.1 MG tablet Take 0.1 mg by mouth at bedtime.     [provider]  cyclobenzaprine (FLEXERIL) 10 MG tablet Take 1 tablet (10 mg total) by mouth 3 (three) times daily as needed. 08/17/18   Sable Feil, PA-C  desvenlafaxine (PRISTIQ) 100 MG 24 hr tablet Take 100 mg by mouth daily. Pt takes 100 mg tab and 50 mg tab for total  of 150 mg    [provider]  desvenlafaxine (PRISTIQ) 50 MG 24 hr tablet Take 50 mg by mouth daily. Pt takes 100 mg tab and 50 mg tab for total of 150 mg    [provider]  diazepam (VALIUM) 5 MG tablet To be taken 3 times daily with Antivert for vertigo as needed 01/23/17   Earleen Newport, MD  esomeprazole (NEXIUM) 40 MG capsule Take 40 mg by mouth 2 (two) times daily.    [provider]  fentaNYL (DURAGESIC - DOSED MCG/HR) 12 MCG/HR Place 12 mcg onto the skin every 3 (three) days.    [provider]  fentaNYL (DURAGESIC - DOSED MCG/HR) 25 MCG/HR patch Place 25 mcg onto the skin every 3 (three) days.    [provider]  ferrous sulfate 325 (65 FE) MG tablet Take 1 tablet (325 mg total) by mouth 2 (two) times daily with a meal. 12/26/16   Hillary Bow, MD  fexofenadine (ALLEGRA) 180 MG tablet  01/25/17  [provider]  fluticasone (FLONASE) 50 MCG/ACT nasal spray Place 1 spray into both nostrils 2 (two) times daily.    [provider]  folic acid (FOLVITE) 1 MG tablet Take 1 mg by mouth daily.    [provider]  hydrOXYzine (ATARAX/VISTARIL) 25 MG tablet Take 25 mg by mouth 3 (three) times daily.    [provider]  meclizine (ANTIVERT) 25 MG tablet Take 1 tablet (25 mg total) by mouth 3 (three) times daily as needed for dizziness or nausea. 01/23/17   Earleen Newport, MD  metoprolol succinate (TOPROL-XL) 100 MG 24 hr tablet Take 100 mg by mouth daily. Take with or immediately following a meal.    [provider]  mirtazapine (REMERON) 45 MG tablet Take 45 mg by mouth at bedtime.    [provider]  nitroGLYCERIN (NITROSTAT) 0.3 MG SL tablet Place under the tongue.    [provider]  ONE TOUCH ULTRA TEST test strip  01/25/17   [provider]  ONGLYZA 5 MG TABS tablet Take 1 tablet by mouth daily. 08/28/16   [provider]  oxybutynin (DITROPAN) 5 MG tablet Take 5  mg by mouth 2 (two) times daily.    [provider]  oxyCODONE (OXY IR/ROXICODONE) 5 MG immediate release tablet Take 5 mg by mouth every 6 (six) hours as needed for severe pain.    [provider]  potassium chloride (K-DUR) 10 MEQ tablet Take 10 mEq by mouth daily.    [provider]  simvastatin (ZOCOR) 40 MG tablet Take 1 tablet by mouth daily. 09/24/16   [provider]  sucralfate (CARAFATE) 1 g tablet Take 1 g by mouth 4 (four) times daily.     [provider]  tamsulosin (FLOMAX) 0.4 MG CAPS capsule Take 0.4 mg by mouth daily. 04/09/17   [provider]  Vitamin D, Ergocalciferol, (DRISDOL) 50000 units CAPS capsule Take 1 capsule (50,000 Units total) by mouth every 7 (seven) days. Patient not taking: Reported on 06/09/2017 10/05/16   Theodoro Grist, MD  zolpidem (AMBIEN) 10 MG tablet Take 10 mg by mouth at bedtime as needed for sleep.    [provider]    Allergies Morphine, Iodinated diagnostic agents, Aspirin, Etodolac, Ibuprofen, Shellfish allergy, and Tylenol [acetaminophen]  Family Hx Family History  Problem Relation Age of Onset  . Hypertension Father   . COPD Father   . Heart disease Father   . Breast cancer Neg Hx     Social Hx Social History   Tobacco Use  . Smoking status: Never Smoker  . Smokeless tobacco: Never Used  Substance Use Topics  . Alcohol use: No    Alcohol/week: 0.0 standard drinks  . Drug use: No     Review of Systems  Constitutional: Negative for fever. Negative for chills. Eyes: Negative for visual changes. ENT: Negative for sore throat. Cardiovascular: Negative for chest pain. Respiratory: Negative for shortness of breath. Gastrointestinal: Positive for epigastric abdominal pain, dark black stool. Genitourinary: Negative for dysuria. Musculoskeletal: Negative for leg swelling. Skin: Negative for rash. Neurological: Negative for headaches.   Physical Exam  Vital  Signs: ED Triage Vitals  Enc Vitals Group     BP 05/04/20 1636 (!) 75/50     Pulse Rate 05/04/20 1636 85     Resp 05/04/20 1636 18     Temp 05/04/20 1636 98.4 F (36.9 C)     Temp Source 05/04/20 1636 Oral     SpO2  05/04/20 1636 97 %     Weight 05/04/20 1634 253 lb (114.8 kg)     Height 05/04/20 1634 5\' 7"  (1.702 m)     Head Circumference --      Peak Flow --      Pain Score 05/04/20 1633 10     Pain Loc --      Pain Edu? --      Excl. in [place]? --     Constitutional: Alert and oriented. Well appearing. NAD.  Head: Normocephalic. Atraumatic. Eyes: Conjunctivae clear. Sclera anicteric. Pupils equal and symmetric. Nose: No masses or lesions. No congestion or rhinorrhea. Mouth/Throat: Wearing mask.  Neck: No stridor. Trachea midline.  Cardiovascular: Normal rate, regular rhythm. Extremities well perfused. Respiratory: Normal respiratory effort.  Lungs CTAB. Gastrointestinal: Soft. Non-distended. Non-tender.  Genitourinary: RN chaperone present. Black stool, immediately guaiac positive. Musculoskeletal: No lower extremity edema. No deformities. Neurologic:  Normal speech and language. No gross focal or lateralizing neurologic deficits are appreciated.  Skin: Skin is warm, dry and intact. No rash noted. Psychiatric: Mood and affect are appropriate for situation.  EKG  Personally reviewed and interpreted by myself.   Date: 05/04/20 Time: 1640 Rate: 80 Rhythm: sinus Axis: normal Intervals: appear grossly normal T wave inversion V2 Significant baseline artifact No STEMI    Radiology  Personally reviewed available imaging myself.   CT A/P 1. Moderate fecal retention. 2. Postsurgical changes from prior bariatric surgery, with stable small bowel-small bowel anastomosis within the left mid abdomen. No gross abnormalities on this unenhanced exam. 3. No acute intra-abdominal or intrapelvic process.     Procedures  Procedure(s) performed (including critical  care):  Procedures   Initial Impression / Assessment and Plan / MDM / ED Course  59 y.o. female who presents to the ED for epigastric abdominal pain and dark black stools x several days.  Patient with a history of peptic ulcer disease and prior bleeding requiring transfusion. Exam confirms black stool on rectal, immediately guaiac positive. Presentation most suggestive of/concerning for upper GIB.  Initial blood pressure on arrival to the ER 70 systolic, improved on recheck 110s.  Ddx: PUD, bleeding ulcer, GI bleed, colitis, complication from her gastric bypass  Will plan for labs, imaging, fluids, IV PPI.  Recheck blood pressure upon patient's arrival to her room improved to 174-944H systolic.  CBC reveals initial hemoglobin normal, leukocytosis to 16. Will obtain CT to eval for anastomotic ulcer vs other inflammation vs colitis. BUN (81) and Cr (4.41 from ~2 previously) both elevated, and consistent with GIB. Receiving fluids. CT w/o acute abnormality.   Discussed with hospitalist for admission.  Also FYI-d GI, Dr. Allen Norris regarding admit as well.   _______________________________   As part of my medical decision making I have reviewed available labs, radiology tests, reviewed old records/performed chart review.    Final Clinical Impression(s) / ED Diagnosis  Epigastric abdominal pain Black stool Melena    Note:  This document was prepared using Dragon voice recognition software and may include unintentional dictation errors.   Lilia Pro., MD 05/04/20 [id number]

## 2020-05-04 NOTE — ED Notes (Signed)
Pt up to bathroom.  Family with pt.

## 2020-05-04 NOTE — ED Triage Notes (Signed)
Pt c/o lower abdominal pain with black stool X 2 days. Hx bleeding ulcer. bp in 60s at home. 75/50 in triage.

## 2020-05-04 NOTE — ED Notes (Signed)
Pt reports abd pain and black stools today.  No vomiting.  Hx ulcers.  Iv started and labs sent.  ekg done.  No chest pain or sob.  md at bedside.  Family with pt.  nsr on monitor.  meds given.

## 2020-05-04 NOTE — ED Notes (Signed)
Pt sleeping  Family with pt  Pt waiting on admission bed.

## 2020-05-04 NOTE — ED Notes (Signed)
To Ct scan

## 2020-05-05 ENCOUNTER — Encounter
Admission: EM | Discharge status: Against Medical Advice | Payer: Self-pay | Source: Home / Self Care | Attending: Internal Medicine

## 2020-05-05 ENCOUNTER — Inpatient Hospital Stay: Payer: Medicare Other | Admitting: Anesthesiology

## 2020-05-05 ENCOUNTER — Encounter: Payer: Self-pay | Admitting: Family Medicine

## 2020-05-05 ENCOUNTER — Other Ambulatory Visit: Payer: Self-pay

## 2020-05-05 DIAGNOSIS — N179 Acute kidney failure, unspecified: Secondary | ICD-10-CM

## 2020-05-05 HISTORY — PX: ESOPHAGOGASTRODUODENOSCOPY: SHX5428

## 2020-05-05 LAB — CBC
HCT: 34.4 % — ABNORMAL LOW (ref 36.0–46.0)
Hemoglobin: 10.8 g/dL — ABNORMAL LOW (ref 12.0–15.0)
MCH: 27.4 pg (ref 26.0–34.0)
MCHC: 31.4 g/dL (ref 30.0–36.0)
MCV: 87.3 fL (ref 80.0–100.0)
Platelets: 274 10*3/uL (ref 150–400)
RBC: 3.94 MIL/uL (ref 3.87–5.11)
RDW: 15 % (ref 11.5–15.5)
WBC: 16.1 10*3/uL — ABNORMAL HIGH (ref 4.0–10.5)
nRBC: 0 % (ref 0.0–0.2)

## 2020-05-05 LAB — BASIC METABOLIC PANEL
Anion gap: 8 (ref 5–15)
BUN: 76 mg/dL — ABNORMAL HIGH (ref 8–23)
CO2: 19 mmol/L — ABNORMAL LOW (ref 22–32)
Calcium: 8.5 mg/dL — ABNORMAL LOW (ref 8.9–10.3)
Chloride: 115 mmol/L — ABNORMAL HIGH (ref 98–111)
Creatinine, Ser: 2.65 mg/dL — ABNORMAL HIGH (ref 0.44–1.00)
GFR calc Af Amer: 22 mL/min — ABNORMAL LOW (ref >60)
GFR calc non Af Amer: 19 mL/min — ABNORMAL LOW (ref >60)
Glucose, Bld: 109 mg/dL — ABNORMAL HIGH (ref 70–99)
Potassium: 4.6 mmol/L (ref 3.5–5.1)
Sodium: 142 mmol/L (ref 135–145)

## 2020-05-05 LAB — HIV ANTIBODY (ROUTINE TESTING W REFLEX): HIV Screen 4th Generation wRfx: NONREACTIVE

## 2020-05-05 LAB — GLUCOSE, CAPILLARY: Glucose-Capillary: 82 mg/dL (ref 70–99)

## 2020-05-05 SURGERY — EGD (ESOPHAGOGASTRODUODENOSCOPY)
Anesthesia: General

## 2020-05-05 MED ORDER — MIDAZOLAM HCL 2 MG/2ML IJ SOLN
INTRAMUSCULAR | Status: AC
  Filled 2020-05-05: qty 2

## 2020-05-05 MED ORDER — FENTANYL CITRATE (PF) 100 MCG/2ML IJ SOLN
INTRAMUSCULAR | Status: AC
  Filled 2020-05-05: qty 2

## 2020-05-05 MED ORDER — SUGAMMADEX SODIUM 200 MG/2ML IV SOLN
INTRAVENOUS | Status: DC | PRN
  Administered 2020-05-05: 200 mg via INTRAVENOUS

## 2020-05-05 MED ORDER — PROPOFOL 500 MG/50ML IV EMUL
INTRAVENOUS | Status: AC
  Filled 2020-05-05: qty 50

## 2020-05-05 MED ORDER — HYDROMORPHONE HCL 1 MG/ML IJ SOLN
INTRAMUSCULAR | Status: AC
  Administered 2020-05-05: 0.5 mg via INTRAVENOUS
  Filled 2020-05-05: qty 1

## 2020-05-05 MED ORDER — SUCCINYLCHOLINE CHLORIDE 20 MG/ML IJ SOLN
INTRAMUSCULAR | Status: DC | PRN
  Administered 2020-05-05: 100 mg via INTRAVENOUS

## 2020-05-05 MED ORDER — SODIUM CHLORIDE 0.9 % IV BOLUS
500.0000 mL | Freq: Once | INTRAVENOUS | Status: AC
  Administered 2020-05-05: 500 mL via INTRAVENOUS

## 2020-05-05 MED ORDER — PROPOFOL 10 MG/ML IV BOLUS
INTRAVENOUS | Status: DC | PRN
  Administered 2020-05-05: 140 mg via INTRAVENOUS

## 2020-05-05 MED ORDER — VITAMIN D (ERGOCALCIFEROL) 1.25 MG (50000 UNIT) PO CAPS: 50000.0000 [IU] | ORAL_CAPSULE | ORAL | Status: DC

## 2020-05-05 MED ORDER — ROCURONIUM BROMIDE 100 MG/10ML IV SOLN
INTRAVENOUS | Status: DC | PRN
  Administered 2020-05-05: 20 mg via INTRAVENOUS

## 2020-05-05 MED ORDER — ALPRAZOLAM 0.25 MG PO TABS
0.2500 mg | ORAL_TABLET | Freq: Three times a day (TID) | ORAL | Status: DC | PRN
  Administered 2020-05-05 – 2020-05-07 (×4): 0.25 mg via ORAL
  Filled 2020-05-05 (×4): qty 1

## 2020-05-05 MED ORDER — DEXAMETHASONE SODIUM PHOSPHATE 10 MG/ML IJ SOLN
INTRAMUSCULAR | Status: DC | PRN
  Administered 2020-05-05: 5 mg via INTRAVENOUS

## 2020-05-05 MED ORDER — HYDROMORPHONE HCL 1 MG/ML IJ SOLN
0.5000 mg | INTRAMUSCULAR | Status: DC | PRN
  Administered 2020-05-05: 0.5 mg via INTRAVENOUS

## 2020-05-05 MED ORDER — FENTANYL CITRATE (PF) 100 MCG/2ML IJ SOLN
INTRAMUSCULAR | Status: DC | PRN
  Administered 2020-05-05: 75 ug via INTRAVENOUS

## 2020-05-05 MED ORDER — OXYCODONE HCL 5 MG PO TABS
10.0000 mg | ORAL_TABLET | Freq: Four times a day (QID) | ORAL | Status: DC | PRN
  Administered 2020-05-05 – 2020-05-07 (×6): 10 mg via ORAL
  Filled 2020-05-05 (×6): qty 2

## 2020-05-05 MED ORDER — MIDAZOLAM HCL 2 MG/2ML IJ SOLN
INTRAMUSCULAR | Status: DC | PRN
  Administered 2020-05-05: 1 mg via INTRAVENOUS

## 2020-05-05 MED ORDER — LIDOCAINE HCL (CARDIAC) PF 100 MG/5ML IV SOSY
PREFILLED_SYRINGE | INTRAVENOUS | Status: DC | PRN
  Administered 2020-05-05: 60 mg via INTRAVENOUS

## 2020-05-05 MED ORDER — MIRTAZAPINE 15 MG PO TABS
45.0000 mg | ORAL_TABLET | Freq: Every day | ORAL | Status: DC
  Administered 2020-05-05 – 2020-05-06 (×2): 45 mg via ORAL
  Filled 2020-05-05 (×2): qty 3

## 2020-05-05 NOTE — Progress Notes (Addendum)
Progress Note    Lindsey [name]  HUD:[id number] DOB: 12-23-1960  DOA: 05/04/2020 PCP: Jodi Marble, MD      Brief Narrative:    Medical records reviewed and are as summarized below:  Lindsey [name] is a 59 y.o. female with a known history of gastric bypass, peptic ulcer disease, COPD, diabetes, GERD, hypertension, hyperlipidemia, sleep apnea, CVA  presented to the hospital with epigastric pain and black stools of 2 days duration.  She also complained of generalized weakness and lightheadedness.  She was hypotensive with systolic blood pressure in the 60s at home.      Assessment/Plan:   Principal Problem:   Upper GI bleed Active Problems:   AKI (acute kidney injury) ([place])   Acute upper GI bleeding, dysphagia, history of peptic ulcer disease: Consulted gastroenterologist, Dr. Alice Reichert, today.  Patient underwent EGD (on 05/05/2020) which showed benign-appearing esophageal stenosis, non-severe esophagitis without bleeding and gastric bypass with a small-sized pouch and intact staple line. Gastrojejunal anastomosis characterized by healthy appearing mucosa. Mechanical soft diet was recommended.  Continue Protonix.  Acute kidney injury on CKD stage IV: Creatinine has improved.  Discontinue IV fluids to avoid fluid overload.  Hypotension: BP has improved.  Hold antihypertensives for now.  Mild acute blood loss anemia: No indication for blood transfusion.  Monitor H&H.  Type 2 diabetes mellitus: Metformin and Farxiga ordered.  NovoLog as needed for hypoglycemia  COPD: Compensated.  Continue bronchodilators  Depression: Continue mirtazapine  Arthritis, history of back surgery: Analgesics as needed for pain.  Body mass index is 39.63 kg/m.  (Morbid obesity)   Family Communication/Anticipated D/C date and plan/Code Status   DVT prophylaxis: SCD Code Status: Full code Family Communication: Plan discussed with the patient Disposition Plan:    Status is:  Inpatient  Remains inpatient appropriate because:Inpatient level of care appropriate due to severity of illness   Dispo: The patient is from: Home              Anticipated d/c is to: Home              Anticipated d/c date is: 1 day              Patient currently is not medically stable to d/c.           Subjective:   C/o epigastric pain, difficulty swallowing (food stuck in her throat) and black stools.  Objective:    Vitals:   05/05/20 1540 05/05/20 1554 05/05/20 1605 05/05/20 1615  BP: 103/73 96/69    Pulse: (!) 101 93 92   Resp: 15 19 18 17   Temp: (!) 96.7 F (35.9 C)     TempSrc:      SpO2: 99% 99% 100%   Weight:      Height:       No data found.   Intake/Output Summary (Last 24 hours) at 05/05/2020 1615 Last data filed at 05/05/2020 1534 Gross per 24 hour  Intake 1350 ml  Output --  Net 1350 ml   Filed Weights   05/04/20 1634  Weight: 114.8 kg    Exam:  GEN: NAD SKIN: No rash EYES: EOMI ENT: MMM CV: RRR PULM: CTA B ABD: soft, obese, epigastric tenderness, +BS CNS: AAO x 3, non focal EXT: No edema or tenderness   Data Reviewed:   I have personally reviewed following labs and imaging studies:  Labs: Labs show the following:   Basic Metabolic Panel: Recent Labs  Lab 05/04/20 1648 05/05/20  0522  NA 139 142  K 4.9 4.6  CL 109 115*  CO2 19* 19*  GLUCOSE 164* 109*  BUN 81* 76*  CREATININE 4.41* 2.65*  CALCIUM 9.0 8.5*   GFR Estimated Creatinine Clearance: 29.2 mL/min (A) (by C-G formula based on SCr of 2.65 mg/dL (H)). Liver Function Tests: Recent Labs  Lab 05/04/20 1648  AST 22  ALT 21  ALKPHOS 138*  BILITOT 0.8  PROT 7.4  ALBUMIN 3.6   No results for input(s): LIPASE, AMYLASE in the last 168 hours. No results for input(s): AMMONIA in the last 168 hours. Coagulation profile Recent Labs  Lab 05/04/20 1648 05/04/20 2153  INR 1.0 1.0    CBC: Recent Labs  Lab 05/04/20 1648 05/04/20 2153 05/05/20 0522  WBC 16.6*  18.0* 16.1*  NEUTROABS 13.7*  --   --   HGB 12.3 11.6* 10.8*  HCT 38.4 36.8 34.4*  MCV 85.7 88.0 87.3  PLT 333 285 274   Cardiac Enzymes: No results for input(s): CKTOTAL, CKMB, CKMBINDEX, TROPONINI in the last 168 hours. BNP (last 3 results) No results for input(s): PROBNP in the last 8760 hours. CBG: Recent Labs  Lab 05/05/20 1554  GLUCAP 82   D-Dimer: No results for input(s): DDIMER in the last 72 hours. Hgb A1c: No results for input(s): HGBA1C in the last 72 hours. Lipid Profile: No results for input(s): CHOL, HDL, LDLCALC, TRIG, CHOLHDL, LDLDIRECT in the last 72 hours. Thyroid function studies: No results for input(s): TSH, T4TOTAL, T3FREE, THYROIDAB in the last 72 hours.  Invalid input(s): FREET3 Anemia work up: No results for input(s): VITAMINB12, FOLATE, FERRITIN, TIBC, IRON, RETICCTPCT in the last 72 hours. Sepsis Labs: Recent Labs  Lab 05/04/20 1648 05/04/20 2153 05/05/20 0522  WBC 16.6* 18.0* 16.1*    Microbiology Recent Results (from the past 240 hour(s))  SARS Coronavirus 2 by RT PCR (hospital order, performed in [facility] hospital lab) Nasopharyngeal Nasopharyngeal Swab     Status: None   Collection Time: 05/04/20  4:45 PM   Specimen: Nasopharyngeal Swab  Result Value Ref Range Status   SARS Coronavirus 2 NEGATIVE NEGATIVE Final    Comment: (NOTE) SARS-CoV-2 target nucleic acids are NOT DETECTED. The SARS-CoV-2 RNA is generally detectable in upper and lower respiratory specimens during the acute phase of infection. The lowest concentration of SARS-CoV-2 viral copies this assay can detect is 250 copies / mL. A negative result does not preclude SARS-CoV-2 infection and should not be used as the sole basis for treatment or other patient management decisions.  A negative result may occur with improper specimen collection / handling, submission of specimen other than nasopharyngeal swab, presence of viral mutation(s) within the areas targeted by this  assay, and inadequate number of viral copies (<250 copies / mL). A negative result must be combined with clinical observations, patient history, and epidemiological information. Fact Sheet for Patients:   StrictlyIdeas.no Fact Sheet for Healthcare Providers: BankingDealers.co.za This test is not yet approved or cleared  by the [place] FDA and has been authorized for detection and/or diagnosis of SARS-CoV-2 by FDA under an Emergency Use Authorization (EUA).  This EUA will remain in effect (meaning this test can be used) for the duration of the COVID-19 declaration under Section 564(b)(1) of the Act, 21 U.S.C. section 360bbb-3(b)(1), unless the authorization is terminated or revoked sooner. Performed at [facility], [address], [place], [place] [address]     Procedures and diagnostic studies:  CT Abdomen Pelvis Wo Contrast  Result Date: 05/04/2020 CLINICAL DATA:  Epigastric pain, dark stool, elevated white blood cell count EXAM: CT ABDOMEN AND PELVIS WITHOUT CONTRAST TECHNIQUE: Multidetector CT imaging of the abdomen and pelvis was performed following the standard protocol without IV contrast. COMPARISON:  05/03/2017 FINDINGS: Lower chest: No acute pleural or parenchymal lung disease. Hepatobiliary: No focal liver abnormality is seen. Status post cholecystectomy. No biliary dilatation. Pancreas: Unremarkable. No pancreatic ductal dilatation or surrounding inflammatory changes. Spleen: Normal in size without focal abnormality. Adrenals/Urinary Tract: Bilateral adrenal nodules are again identified, previously shown to represent adrenal adenomas. No urinary tract calculi or obstructive uropathy. Bladder is decompressed which limits its evaluation. Stomach/Bowel: The colon is moderately distended with gas and stool. No wall thickening or inflammatory changes to suggest colitis. No small bowel wall thickening or inflammatory changes.  Small bowel-small bowel anastomosis within the left mid abdomen is grossly unremarkable on this unenhanced exam. Postsurgical changes are seen from prior bariatric surgery. Hiatal hernia is stable. Vascular/Lymphatic: No significant vascular findings are present. No enlarged abdominal or pelvic lymph nodes. Reproductive: Status post hysterectomy. No adnexal masses. Other: Postsurgical changes from prior umbilical hernia repair. No free fluid or free gas. Musculoskeletal: Spinal stimulator is seen within the lower thoracic central canal. There are no acute or destructive bony lesions. Postsurgical changes are seen at the lumbosacral junction. Reconstructed images demonstrate no additional findings. IMPRESSION: 1. Moderate fecal retention. 2. Postsurgical changes from prior bariatric surgery, with stable small bowel-small bowel anastomosis within the left mid abdomen. No gross abnormalities on this unenhanced exam. 3. No acute intra-abdominal or intrapelvic process. Electronically Signed   By: Randa Ngo M.D.   On: 05/04/2020 17:48    Medications:   . HYDROmorphone      . [MAR Hold] mirtazapine  45 mg Oral QHS  . [MAR Hold] pantoprazole (PROTONIX) IV  40 mg Intravenous Q12H   Continuous Infusions:    LOS: 1 day   Lindsey [name]  [facility]     05/05/2020, 4:15 PM

## 2020-05-05 NOTE — Op Note (Signed)
[facility] Patient Name: Lindsey [name] Procedure Date: 05/05/2020 2:36 PM MRN: [id number] Account #: [id number] Date of Birth: December 18, 1960 Admit Type: Outpatient Age: 59 Room: [facility] ENDO ROOM 4 Gender: Female Note Status: Finalized Procedure:             Upper GI endoscopy Indications:           Iron deficiency anemia due to suspected upper                         gastrointestinal bleeding, Melena Providers:             Benay Pike. Rehmat Murtagh MD, MD Medicines:             Propofol per Anesthesia Complications:         No immediate complications. Estimated blood loss: None. Procedure:             Pre-Anesthesia Assessment:                        - The risks and benefits of the procedure and the                         sedation options and risks were discussed with the                         patient. All questions were answered and informed                         consent was obtained.                        - Patient identification and proposed procedure were                         verified prior to the procedure by the nurse. The                         procedure was verified in the procedure room.                        - ASA Grade Assessment: III - A patient with severe                         systemic disease.                        - After reviewing the risks and benefits, the patient                         was deemed in satisfactory condition to undergo the                         procedure.                        After obtaining informed consent, the endoscope was                         passed under direct vision. Throughout the procedure,  the patient's blood pressure, pulse, and oxygen                         saturations were monitored continuously. The Endoscope                         was introduced through the mouth, and advanced to the                         efferent jejunal loop. The upper GI endoscopy was                          accomplished without difficulty. The patient tolerated                         the procedure well. Findings:      One benign-appearing, intrinsic mild stenosis was found in the lower       third of the esophagus. This stenosis measured 1.4 cm (inner diameter) x       less than one cm (in length). The stenosis was traversed.      Non-severe esophagitis with no bleeding was found in the distal       esophagus. Some food particles were also noted. Suspect mechanical       esophagitis from food impaction, now cleared spontaneously.      Evidence of a gastric bypass was found. A gastric pouch with a small       size was found. The staple line appeared intact. The gastrojejunal       anastomosis was characterized by healthy appearing mucosa. This was       traversed. The pouch-to-jejunum limb was characterized by healthy       appearing mucosa. The jejunojejunal anastomosis was characterized by       healthy appearing mucosa. The duodenum-to-jejunum limb was not examined       as it could not be traversed.      There is no endoscopic evidence of bleeding, inflammation, mucosal       abnormalities or ulceration in the jejunum. Impression:            - Benign-appearing esophageal stenosis.                        - Non-severe esophagitis with no bleeding.                        - Gastric bypass with a small-sized pouch and intact                         staple line. Gastrojejunal anastomosis characterized                         by healthy appearing mucosa.                        - No specimens collected. Recommendation:        - Mechanical soft diet.                        - Use Protonix (pantoprazole) 40 mg PO BID.                        -  Return patient to hospital ward for observation. Procedure Code(s):     --- Professional ---                        [phone], Esophagogastroduodenoscopy, flexible,                         transoral; diagnostic, including collection of                          specimen(s) by brushing or washing, when performed                         (separate procedure) Diagnosis Code(s):     --- Professional ---                        K92.1, Melena (includes Hematochezia)                        D50.9, Iron deficiency anemia, unspecified                        Z98.84, Bariatric surgery status                        K20.90, Esophagitis, unspecified without bleeding                        K22.2, Esophageal obstruction CPT copyright 2019 American Medical Association. All rights reserved. The codes documented in this report are preliminary and upon coder review may  be revised to meet current compliance requirements. Efrain Sella MD, MD 05/05/2020 3:50:16 PM This report has been signed electronically. Number of Addenda: 0 Note Initiated On: 05/05/2020 2:36 PM Estimated Blood Loss:  Estimated blood loss: none.      [facility]

## 2020-05-05 NOTE — Interval H&P Note (Signed)
History and Physical Interval Note:  05/05/2020 2:54 PM  Lindsey [name]  has presented today for surgery, with the diagnosis of Epigastric abd pain, melena, heme positive stool, Hx of PUD.  The various methods of treatment have been discussed with the patient and family. After consideration of risks, benefits and other options for treatment, the patient has consented to  Procedure(s): ESOPHAGOGASTRODUODENOSCOPY (EGD) (N/A) as a surgical intervention.  The patient's history has been reviewed, patient examined, no change in status, stable for surgery.  I have reviewed the patient's chart and labs.  Questions were answered to the patient's satisfaction.     [place], [place]

## 2020-05-05 NOTE — Anesthesia Procedure Notes (Signed)
Procedure Name: Intubation Date/Time: 05/05/2020 3:06 PM Performed by: Allean Found, CRNA Pre-anesthesia Checklist: Patient identified, Patient being monitored, Timeout performed, Emergency Drugs available and Suction available Patient Re-evaluated:Patient Re-evaluated prior to induction Oxygen Delivery Method: Circle system utilized Preoxygenation: Pre-oxygenation with 100% oxygen Induction Type: IV induction, Rapid sequence and Cricoid Pressure applied Laryngoscope Size: 3 and McGraph Grade View: Grade I Tube type: Oral Tube size: 7.0 mm Number of attempts: 1 Airway Equipment and Method: Stylet and Video-laryngoscopy Placement Confirmation: ETT inserted through vocal cords under direct vision,  positive ETCO2 and breath sounds checked- equal and bilateral Secured at: 21 cm Tube secured with: Tape Dental Injury: Teeth and Oropharynx as per pre-operative assessment

## 2020-05-05 NOTE — Consult Note (Signed)
GI Inpatient Consult Note  Reason for Consult: Epigastric abd pain, melena   Attending Requesting Consult: Dr. Jennye Boroughs, MD  History of Present Illness: Lindsey [name] is a 59 y.o. female seen for evaluation of epigastric abd pain and melena at the request of Dr. Mal Misty. Pt has a PMH of DM, HTN, HLD, Hx of PUD, OSA, Hx of CVA, Hx of myocardial infarction, GERD, and spinal cord stimulator status. She presented to the [facility] ED yesterday afternoon for several day history of epigastric abdominal pain and black, tarry stools. She reports pain is burning and gnawing in nature and is consistent to the last time she had peptic ulcer disease. She reports associated nausea without any emesis. She has noticed some esophageal dysphagia to meats like chicken which she localizes to the mid sternum. She reports she had to regurgitate the chicken in order to get relief. She reports heartburn symptoms have been more frequent over the past several weeks. She takes Nexium 40 mg twice daily and has been for a long time. She avoids NSAIDs and alcohol. Upon presentation to the ED, her hemoglobin was 12.3, BUN 81, Creatinine 4.41. Per ED physician, she had black stool in her rectal vault which was hemoccult positive. She was started on IV Protonix and IV fluids. Repeat hemoglobin this morning was 10.8. She reports she had EGD, colonoscopy, and capsule endoscopy in 2018 performed by Dr. Vicente Males for iron-deficiency anemia with unremarkable work-up. She was told her IDA was most likely 2/2 to her hx of gastric bypass. She denies any recent dietary or medication changes.    Past Medical History:  Past Medical History:  Diagnosis Date  . Allergy   . Anemia   . Arthritis    knees  . COPD (chronic obstructive pulmonary disease) ([place])   . Diabetes mellitus without complication (HCC)    diet controlled  . GERD (gastroesophageal reflux disease)   . Heart attack ([place]) 2002  . Hyperlipidemia   . Hypertension   . PUD (peptic  ulcer disease)   . Sleep apnea    can't tolerate CPAP.  Uses O2 only  . Spinal cord stimulator status    for lower back pain  . Stroke [facility]) 2004   "mini-stroke"  . Vertigo   . Wears dentures    full upper and lower    Problem List: Patient Active Problem List   Diagnosis Date Noted  . Upper GI bleed 05/04/2020  . Acute-on-chronic kidney injury ([place]) 06/09/2017  . Symptomatic anemia 12/24/2016  . Chest pain 12/23/2016  . Dyspnea 12/23/2016  . Palpitations 12/23/2016  . Hypotension 09/29/2016  . Renal insufficiency 09/29/2016  . Anemia 09/29/2016  . Leukocytosis 09/29/2016  . Prediabetes 09/29/2016  . Jejunal inflammation 09/29/2016  . Abnormal findings on esophagogastroduodenoscopy (EGD) 09/29/2016  . Vitamin D deficiency 09/29/2016  . Syncope 09/27/2016  . Medication overuse headache 11/08/2015  . Morbid obesity with BMI of 45.0-49.9, adult ([place]) 11/08/2015  . Seizures ([place]) 09/02/2015  . Lipodystrophy 10/13/2014  . 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 Disease of female genital organs 04/01/2013  . Difficult or painful urination 03/18/2013  . Urge incontinence 03/18/2013  . Urgency of micturation 03/18/2013    Past Surgical History: Past Surgical History:  Procedure Laterality Date  . ABDOMINAL HYSTERECTOMY    . back surgery    . broken wrist    . COLONOSCOPY WITH PROPOFOL N/A 01/30/2017   Procedure: COLONOSCOPY WITH PROPOFOL;  Surgeon: Jonathon Bellows, MD;  Location: [facility] ENDOSCOPY;  Service: Endoscopy;  Laterality: N/A;  . COLONOSCOPY WITH PROPOFOL N/A 02/20/2017   Procedure: Colonoscopy with propofol ;  Surgeon: Jonathon Bellows, MD;  Location: [facility] ENDOSCOPY;  Service: Endoscopy;  Laterality: N/A;  . COLONOSCOPY WITH PROPOFOL N/A 03/13/2017   Procedure: COLONOSCOPY WITH PROPOFOL;  Surgeon: Jonathon Bellows, MD;  Location: [facility] ENDOSCOPY;  Service: Endoscopy;  Laterality: N/A;  . COLONOSCOPY WITH PROPOFOL N/A 04/17/2017   Procedure:  COLONOSCOPY WITH PROPOFOL;  Surgeon: Jonathon Bellows, MD;  Location: [facility] ENDOSCOPY;  Service: Endoscopy;  Laterality: N/A;  . ESOPHAGOGASTRODUODENOSCOPY (EGD) WITH PROPOFOL N/A 09/28/2016   Procedure: ESOPHAGOGASTRODUODENOSCOPY (EGD) WITH PROPOFOL;  Surgeon: Manya Silvas, MD;  Location: [facility] ENDOSCOPY;  Service: Endoscopy;  Laterality: N/A;  . ESOPHAGOGASTRODUODENOSCOPY (EGD) WITH PROPOFOL N/A 03/13/2017   Procedure: ESOPHAGOGASTRODUODENOSCOPY (EGD) WITH PROPOFOL;  Surgeon: Jonathon Bellows, MD;  Location: [facility] ENDOSCOPY;  Service: Endoscopy;  Laterality: N/A;  . GALLBLADDER SURGERY    . GASTRIC BYPASS    . GIVENS CAPSULE STUDY N/A 03/13/2017   Procedure: GIVENS CAPSULE STUDY;  Surgeon: Jonathon Bellows, MD;  Location: [facility] ENDOSCOPY;  Service: Endoscopy;  Laterality: N/A;  . HERNIA REPAIR    . JOINT REPLACEMENT  1995  . REPLACEMENT TOTAL KNEE    . ROTATOR CUFF REPAIR      Allergies: Allergies  Allergen Reactions  . Morphine Hives  . Iodinated Diagnostic Agents Hives  . Aspirin Hives    Noted on MD progress notes and discussed with MD 09/02/15  . Etodolac Hives  . Ibuprofen Hives  . Shellfish Allergy Hives  . Tylenol [Acetaminophen] Hives    Upset stomach     Home Medications: (Not in a hospital admission)  Home medication reconciliation was completed with the patient.   Scheduled Inpatient Medications:   . mirtazapine  45 mg Oral QHS  . pantoprazole (PROTONIX) IV  40 mg Intravenous Q12H    Continuous Inpatient Infusions:   . sodium chloride 100 mL/hr at 05/05/20 1124    PRN Inpatient Medications:  albuterol, bisacodyl, guaiFENesin, ipratropium, ondansetron **OR** ondansetron (ZOFRAN) IV, oxyCODONE, senna-docusate, zolpidem  Family History: family history includes COPD in her father; Heart disease in her father; Hypertension in her father.  The patient's family history is negative for inflammatory bowel disorders, GI malignancy, or solid organ transplantation.  Social History:    reports that she has never smoked. She has never used smokeless tobacco. She reports that she does not drink alcohol or use drugs. The patient denies ETOH, tobacco, or drug use.   Review of Systems: Constitutional: Weight is stable.  Eyes: No changes in vision. ENT: No oral lesions, sore throat.  GI: see HPI.  Heme/Lymph: No easy bruising.  CV: No chest pain.  GU: No hematuria.  Integumentary: No rashes.  Neuro: No headaches.  Psych: No depression/anxiety.  Endocrine: No heat/cold intolerance.  Allergic/Immunologic: No urticaria.  Resp: No cough, SOB.  Musculoskeletal: No joint swelling.    Physical Examination: BP 100/69   Pulse 84   Temp 98.4 F (36.9 C) (Oral)   Resp 15   Ht 5\' 7"  (1.702 m)   Wt 114.8 kg   SpO2 100%   BMI 39.63 kg/m  Gen: NAD, alert and oriented x 4 HEENT: PEERLA, EOMI, Neck: supple, no JVD or thyromegaly Chest: CTA bilaterally, no wheezes, crackles, or other adventitious sounds CV: RRR, no m/g/c/r Abd: soft, NT, ND, +BS in all four quadrants; no HSM, guarding, ridigity, or rebound tenderness Ext: no edema, well perfused with 2+  pulses, Skin: no rash or lesions noted Lymph: no LAD  Data: Lab Results  Component Value Date   WBC 16.1 (H) 05/05/2020   HGB 10.8 (L) 05/05/2020   HCT 34.4 (L) 05/05/2020   MCV 87.3 05/05/2020   PLT 274 05/05/2020   Recent Labs  Lab 05/04/20 1648 05/04/20 2153 05/05/20 0522  HGB 12.3 11.6* 10.8*   Lab Results  Component Value Date   NA 142 05/05/2020   K 4.6 05/05/2020   CL 115 (H) 05/05/2020   CO2 19 (L) 05/05/2020   BUN 76 (H) 05/05/2020   CREATININE 2.65 (H) 05/05/2020   Lab Results  Component Value Date   ALT 21 05/04/2020   AST 22 05/04/2020   ALKPHOS 138 (H) 05/04/2020   BILITOT 0.8 05/04/2020   Recent Labs  Lab 05/04/20 2153  INR 1.0   CT A/P 1. Moderate fecal retention. 2. Postsurgical changes from prior bariatric surgery, with stable small bowel-small bowel anastomosis within the left  mid abdomen. No gross abnormalities on this unenhanced exam. 3. No acute intra-abdominal or intrapelvic process.  Assessment:  59 y/o AA female with a PMH of DM, HTN, HLD, Hx of PUD, OSA, Hx of CVA, Hx of myocardial infarction, GERD, and spinal cord stimulator status presented to the ED for epigastric abdominal pain, melena, and heme positive stool  1. Epigastric abd pain 2. Melena 3. Heme positive stool 4. Esophageal dysphagia, non-progressive - solid foods only. DDx includes esophageal stricture, Schatzki ring, esophageal web, EoE, malignancy, etc 5. Hx of peptic ulcer disease 6. Iron-deficiency anemia - likely 2/2 hx of gastric bypass  COVID-19 Test: Negative  Plan:  -DDx of epigastric abd pain with melena and heme positive stool includes peptic ulcer disease, gastritis +/- H pylori, esophagitis, duodenitis, AVMs, malignancy, etc  -Hemodynamically stable with no evidence of profound anemia -Agree with IV PPI and IV fluid hydration -Advise EGD for luminal evaluation for diagnostic and potentially therapeutic purposes. Consider esophageal dilatation at time of EGD. We discussed procedure details and indications in room today. -NPO -Plan for EGD this afternoon with Dr. Alice Reichert -Further recommendations after procedure  I reviewed the risks (including bleeding, perforation, infection, anesthesia complications, cardiac/respiratory complications), benefits and alternatives of EGD. Patient consents to proceed.    Thank you for the consult. Please call with questions or concerns.  [facility] [phone] (Cell)

## 2020-05-05 NOTE — ED Notes (Signed)
Pt unhooked from cardiac monitor to ambulate to toilet in rm.

## 2020-05-05 NOTE — ED Notes (Signed)
Patient left with nurses to Gastroenterology. Attempted to call report to floor, but nurse hadn't been assisgned.

## 2020-05-05 NOTE — H&P (View-Only) (Signed)
GI Inpatient Consult Note  Reason for Consult: Epigastric abd pain, melena   Attending Requesting Consult: Dr. Jennye Boroughs, MD  History of Present Illness: Lindsey [name] is a 59 y.o. female seen for evaluation of epigastric abd pain and melena at the request of Dr. Mal Misty. Pt has a PMH of DM, HTN, HLD, Hx of PUD, OSA, Hx of CVA, Hx of myocardial infarction, GERD, and spinal cord stimulator status. She presented to the [facility] ED yesterday afternoon for several day history of epigastric abdominal pain and black, tarry stools. She reports pain is burning and gnawing in nature and is consistent to the last time she had peptic ulcer disease. She reports associated nausea without any emesis. She has noticed some esophageal dysphagia to meats like chicken which she localizes to the mid sternum. She reports she had to regurgitate the chicken in order to get relief. She reports heartburn symptoms have been more frequent over the past several weeks. She takes Nexium 40 mg twice daily and has been for a long time. She avoids NSAIDs and alcohol. Upon presentation to the ED, her hemoglobin was 12.3, BUN 81, Creatinine 4.41. Per ED physician, she had black stool in her rectal vault which was hemoccult positive. She was started on IV Protonix and IV fluids. Repeat hemoglobin this morning was 10.8. She reports she had EGD, colonoscopy, and capsule endoscopy in 2018 performed by Dr. Vicente Males for iron-deficiency anemia with unremarkable work-up. She was told her IDA was most likely 2/2 to her hx of gastric bypass. She denies any recent dietary or medication changes.    Past Medical History:  Past Medical History:  Diagnosis Date  . Allergy   . Anemia   . Arthritis    knees  . COPD (chronic obstructive pulmonary disease) ([place])   . Diabetes mellitus without complication (HCC)    diet controlled  . GERD (gastroesophageal reflux disease)   . Heart attack ([place]) 2002  . Hyperlipidemia   . Hypertension   . PUD (peptic  ulcer disease)   . Sleep apnea    can't tolerate CPAP.  Uses O2 only  . Spinal cord stimulator status    for lower back pain  . Stroke [facility]) 2004   "mini-stroke"  . Vertigo   . Wears dentures    full upper and lower    Problem List: Patient Active Problem List   Diagnosis Date Noted  . Upper GI bleed 05/04/2020  . Acute-on-chronic kidney injury ([place]) 06/09/2017  . Symptomatic anemia 12/24/2016  . Chest pain 12/23/2016  . Dyspnea 12/23/2016  . Palpitations 12/23/2016  . Hypotension 09/29/2016  . Renal insufficiency 09/29/2016  . Anemia 09/29/2016  . Leukocytosis 09/29/2016  . Prediabetes 09/29/2016  . Jejunal inflammation 09/29/2016  . Abnormal findings on esophagogastroduodenoscopy (EGD) 09/29/2016  . Vitamin D deficiency 09/29/2016  . Syncope 09/27/2016  . Medication overuse headache 11/08/2015  . Morbid obesity with BMI of 45.0-49.9, adult ([place]) 11/08/2015  . Seizures ([place]) 09/02/2015  . Lipodystrophy 10/13/2014  . 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 Disease of female genital organs 04/01/2013  . Difficult or painful urination 03/18/2013  . Urge incontinence 03/18/2013  . Urgency of micturation 03/18/2013    Past Surgical History: Past Surgical History:  Procedure Laterality Date  . ABDOMINAL HYSTERECTOMY    . back surgery    . broken wrist    . COLONOSCOPY WITH PROPOFOL N/A 01/30/2017   Procedure: COLONOSCOPY WITH PROPOFOL;  Surgeon: Jonathon Bellows, MD;  Location: [facility] ENDOSCOPY;  Service: Endoscopy;  Laterality: N/A;  . COLONOSCOPY WITH PROPOFOL N/A 02/20/2017   Procedure: Colonoscopy with propofol ;  Surgeon: Jonathon Bellows, MD;  Location: [facility] ENDOSCOPY;  Service: Endoscopy;  Laterality: N/A;  . COLONOSCOPY WITH PROPOFOL N/A 03/13/2017   Procedure: COLONOSCOPY WITH PROPOFOL;  Surgeon: Jonathon Bellows, MD;  Location: [facility] ENDOSCOPY;  Service: Endoscopy;  Laterality: N/A;  . COLONOSCOPY WITH PROPOFOL N/A 04/17/2017   Procedure:  COLONOSCOPY WITH PROPOFOL;  Surgeon: Jonathon Bellows, MD;  Location: [facility] ENDOSCOPY;  Service: Endoscopy;  Laterality: N/A;  . ESOPHAGOGASTRODUODENOSCOPY (EGD) WITH PROPOFOL N/A 09/28/2016   Procedure: ESOPHAGOGASTRODUODENOSCOPY (EGD) WITH PROPOFOL;  Surgeon: Manya Silvas, MD;  Location: [facility] ENDOSCOPY;  Service: Endoscopy;  Laterality: N/A;  . ESOPHAGOGASTRODUODENOSCOPY (EGD) WITH PROPOFOL N/A 03/13/2017   Procedure: ESOPHAGOGASTRODUODENOSCOPY (EGD) WITH PROPOFOL;  Surgeon: Jonathon Bellows, MD;  Location: [facility] ENDOSCOPY;  Service: Endoscopy;  Laterality: N/A;  . GALLBLADDER SURGERY    . GASTRIC BYPASS    . GIVENS CAPSULE STUDY N/A 03/13/2017   Procedure: GIVENS CAPSULE STUDY;  Surgeon: Jonathon Bellows, MD;  Location: [facility] ENDOSCOPY;  Service: Endoscopy;  Laterality: N/A;  . HERNIA REPAIR    . JOINT REPLACEMENT  1995  . REPLACEMENT TOTAL KNEE    . ROTATOR CUFF REPAIR      Allergies: Allergies  Allergen Reactions  . Morphine Hives  . Iodinated Diagnostic Agents Hives  . Aspirin Hives    Noted on MD progress notes and discussed with MD 09/02/15  . Etodolac Hives  . Ibuprofen Hives  . Shellfish Allergy Hives  . Tylenol [Acetaminophen] Hives    Upset stomach     Home Medications: (Not in a hospital admission)  Home medication reconciliation was completed with the patient.   Scheduled Inpatient Medications:   . mirtazapine  45 mg Oral QHS  . pantoprazole (PROTONIX) IV  40 mg Intravenous Q12H    Continuous Inpatient Infusions:   . sodium chloride 100 mL/hr at 05/05/20 1124    PRN Inpatient Medications:  albuterol, bisacodyl, guaiFENesin, ipratropium, ondansetron **OR** ondansetron (ZOFRAN) IV, oxyCODONE, senna-docusate, zolpidem  Family History: family history includes COPD in her father; Heart disease in her father; Hypertension in her father.  The patient's family history is negative for inflammatory bowel disorders, GI malignancy, or solid organ transplantation.  Social History:    reports that she has never smoked. She has never used smokeless tobacco. She reports that she does not drink alcohol or use drugs. The patient denies ETOH, tobacco, or drug use.   Review of Systems: Constitutional: Weight is stable.  Eyes: No changes in vision. ENT: No oral lesions, sore throat.  GI: see HPI.  Heme/Lymph: No easy bruising.  CV: No chest pain.  GU: No hematuria.  Integumentary: No rashes.  Neuro: No headaches.  Psych: No depression/anxiety.  Endocrine: No heat/cold intolerance.  Allergic/Immunologic: No urticaria.  Resp: No cough, SOB.  Musculoskeletal: No joint swelling.    Physical Examination: BP 100/69   Pulse 84   Temp 98.4 F (36.9 C) (Oral)   Resp 15   Ht 5\' 7"  (1.702 m)   Wt 114.8 kg   SpO2 100%   BMI 39.63 kg/m  Gen: NAD, alert and oriented x 4 HEENT: PEERLA, EOMI, Neck: supple, no JVD or thyromegaly Chest: CTA bilaterally, no wheezes, crackles, or other adventitious sounds CV: RRR, no m/g/c/r Abd: soft, NT, ND, +BS in all four quadrants; no HSM, guarding, ridigity, or rebound tenderness Ext: no edema, well perfused with 2+  pulses, Skin: no rash or lesions noted Lymph: no LAD  Data: Lab Results  Component Value Date   WBC 16.1 (H) 05/05/2020   HGB 10.8 (L) 05/05/2020   HCT 34.4 (L) 05/05/2020   MCV 87.3 05/05/2020   PLT 274 05/05/2020   Recent Labs  Lab 05/04/20 1648 05/04/20 2153 05/05/20 0522  HGB 12.3 11.6* 10.8*   Lab Results  Component Value Date   NA 142 05/05/2020   K 4.6 05/05/2020   CL 115 (H) 05/05/2020   CO2 19 (L) 05/05/2020   BUN 76 (H) 05/05/2020   CREATININE 2.65 (H) 05/05/2020   Lab Results  Component Value Date   ALT 21 05/04/2020   AST 22 05/04/2020   ALKPHOS 138 (H) 05/04/2020   BILITOT 0.8 05/04/2020   Recent Labs  Lab 05/04/20 2153  INR 1.0   CT A/P 1. Moderate fecal retention. 2. Postsurgical changes from prior bariatric surgery, with stable small bowel-small bowel anastomosis within the left  mid abdomen. No gross abnormalities on this unenhanced exam. 3. No acute intra-abdominal or intrapelvic process.  Assessment:  59 y/o AA female with a PMH of DM, HTN, HLD, Hx of PUD, OSA, Hx of CVA, Hx of myocardial infarction, GERD, and spinal cord stimulator status presented to the ED for epigastric abdominal pain, melena, and heme positive stool  1. Epigastric abd pain 2. Melena 3. Heme positive stool 4. Esophageal dysphagia, non-progressive - solid foods only. DDx includes esophageal stricture, Schatzki ring, esophageal web, EoE, malignancy, etc 5. Hx of peptic ulcer disease 6. Iron-deficiency anemia - likely 2/2 hx of gastric bypass  COVID-19 Test: Negative  Plan:  -DDx of epigastric abd pain with melena and heme positive stool includes peptic ulcer disease, gastritis +/- H pylori, esophagitis, duodenitis, AVMs, malignancy, etc  -Hemodynamically stable with no evidence of profound anemia -Agree with IV PPI and IV fluid hydration -Advise EGD for luminal evaluation for diagnostic and potentially therapeutic purposes. Consider esophageal dilatation at time of EGD. We discussed procedure details and indications in room today. -NPO -Plan for EGD this afternoon with Dr. Alice Reichert -Further recommendations after procedure  I reviewed the risks (including bleeding, perforation, infection, anesthesia complications, cardiac/respiratory complications), benefits and alternatives of EGD. Patient consents to proceed.    Thank you for the consult. Please call with questions or concerns.  Reeves Forth [facility] [phone] (Cell)

## 2020-05-05 NOTE — ED Notes (Signed)
Report off to butch rn  

## 2020-05-05 NOTE — Transfer of Care (Signed)
Immediate Anesthesia Transfer of Care Note  Patient: Lindsey [name]  Procedure(s) Performed: ESOPHAGOGASTRODUODENOSCOPY (EGD) (N/A )  Patient Location: PACU  Anesthesia Type:General  Level of Consciousness: awake, alert  and oriented  Airway & Oxygen Therapy: Patient Spontanous Breathing and Patient connected to nasal cannula oxygen  Post-op Assessment: Report given to RN and Post -op Vital signs reviewed and stable  Post vital signs: Reviewed and stable  Last Vitals:  Vitals Value Taken Time  BP 103/73 05/05/20 1540  Temp 35.9 C 05/05/20 1540  Pulse 96 05/05/20 1545  Resp 17 05/05/20 1545  SpO2 98 % 05/05/20 1545  Vitals shown include unvalidated device data.  Last Pain:  Vitals:   05/05/20 1448  TempSrc: Temporal  PainSc:          Complications: No apparent anesthesia complications

## 2020-05-05 NOTE — ED Notes (Signed)
Dr. Mal Misty was contacted about patient's blood pressure and patient's request for the prn oxycodone 10mg . Dr. Mal Misty advised to wait until blood pressure improved from 91/63.

## 2020-05-05 NOTE — Anesthesia Preprocedure Evaluation (Addendum)
Anesthesia Evaluation  Patient identified by MRN, date of birth, ID band Patient awake    Reviewed: Allergy & Precautions, H&P , NPO status , Patient's Chart, lab work & pertinent test results  History of Anesthesia Complications Negative for: history of anesthetic complications  Airway Mallampati: III  TM Distance: >3 FB Neck ROM: full    Dental  (+) Edentulous Lower, Edentulous Upper   Pulmonary shortness of breath, sleep apnea , COPD,    Pulmonary exam normal        Cardiovascular hypertension, (-) angina+ Past MI  Normal cardiovascular exam     Neuro/Psych  Headaches, Seizures -,  CVA negative psych ROS   GI/Hepatic Neg liver ROS, PUD, GERD  Medicated and Controlled,  Endo/Other  diabetes, Type 2  Renal/GU Renal disease     Musculoskeletal  (+) Arthritis ,   Abdominal   Peds  Hematology negative hematology ROS (+)   Anesthesia Other Findings Patient reports sensation of food in her throat.  Past Medical History: No date: Allergy No date: Anemia No date: Arthritis     Comment:  knees No date: COPD (chronic obstructive pulmonary disease) (HCC) No date: Diabetes mellitus without complication (HCC)     Comment:  diet controlled No date: GERD (gastroesophageal reflux disease) 2002: Heart attack ([place]) No date: Hyperlipidemia No date: Hypertension No date: PUD (peptic ulcer disease) No date: Sleep apnea     Comment:  can't tolerate CPAP.  Uses O2 only No date: Spinal cord stimulator status     Comment:  for lower back pain 2004: Stroke [facility])     Comment:  "mini-stroke" No date: Vertigo No date: Wears dentures     Comment:  full upper and lower  Past Surgical History: No date: ABDOMINAL HYSTERECTOMY No date: back surgery No date: broken wrist 01/30/2017: COLONOSCOPY WITH PROPOFOL; N/A     Comment:  Procedure: COLONOSCOPY WITH PROPOFOL;  Surgeon: Jonathon Bellows, MD;  Location: [facility]  ENDOSCOPY;  Service: Endoscopy;              Laterality: N/A; 02/20/2017: COLONOSCOPY WITH PROPOFOL; N/A     Comment:  Procedure: Colonoscopy with propofol ;  Surgeon: Jonathon Bellows, MD;  Location: [facility] ENDOSCOPY;  Service: Endoscopy;              Laterality: N/A; 03/13/2017: COLONOSCOPY WITH PROPOFOL; N/A     Comment:  Procedure: COLONOSCOPY WITH PROPOFOL;  Surgeon: Jonathon Bellows, MD;  Location: [facility] ENDOSCOPY;  Service: Endoscopy;              Laterality: N/A; 04/17/2017: COLONOSCOPY WITH PROPOFOL; N/A     Comment:  Procedure: COLONOSCOPY WITH PROPOFOL;  Surgeon: Jonathon Bellows, MD;  Location[facility] ENDOSCOPY;  Service:               Endoscopy;  Laterality: N/A; 09/28/2016: ESOPHAGOGASTRODUODENOSCOPY (EGD) WITH PROPOFOL; N/A     Comment:  Procedure: ESOPHAGOGASTRODUODENOSCOPY (EGD) WITH               PROPOFOL;  Surgeon: Manya Silvas, MD;  Location: [facility]              ENDOSCOPY;  Service: Endoscopy;  Laterality: N/A; 03/13/2017:  ESOPHAGOGASTRODUODENOSCOPY (EGD) WITH PROPOFOL; N/A     Comment:  Procedure: ESOPHAGOGASTRODUODENOSCOPY (EGD) WITH               PROPOFOL;  Surgeon: Jonathon Bellows, MD;  Location: [facility]               ENDOSCOPY;  Service: Endoscopy;  Laterality: N/A; No date: GALLBLADDER SURGERY No date: GASTRIC BYPASS 03/13/2017: GIVENS CAPSULE STUDY; N/A     Comment:  Procedure: GIVENS CAPSULE STUDY;  Surgeon: Jonathon Bellows,               MD;  Location: [facility] ENDOSCOPY;  Service: Endoscopy;                Laterality: N/A; No date: HERNIA REPAIR 1995: JOINT REPLACEMENT No date: REPLACEMENT TOTAL KNEE No date: ROTATOR CUFF REPAIR  BMI    Body Mass Index: 39.63 kg/m      Reproductive/Obstetrics negative OB ROS                            Anesthesia Physical Anesthesia Plan  ASA: III  Anesthesia Plan: General ETT, Rapid Sequence and Cricoid Pressure   Post-op Pain Management:    Induction: Intravenous  PONV Risk Score  and Plan: Ondansetron, Dexamethasone, Midazolam and Treatment may vary due to age or medical condition  Airway Management Planned: Oral ETT  Additional Equipment:   Intra-op Plan:   Post-operative Plan: Extubation in OR  Informed Consent: I have reviewed the patients History and Physical, chart, labs and discussed the procedure including the risks, benefits and alternatives for the proposed anesthesia with the patient or authorized representative who has indicated his/her understanding and acceptance.     Dental Advisory Given  Plan Discussed with: Anesthesiologist, CRNA and Surgeon  Anesthesia Plan Comments: (Patient consented for risks of anesthesia including but not limited to:  - adverse reactions to medications - damage to eyes, teeth, lips or other oral mucosa - nerve damage due to positioning  - sore throat or hoarseness - Damage to heart, brain, nerves, lungs, other parts of body or loss of life  Patient voiced understanding.)        Anesthesia Quick Evaluation

## 2020-05-06 ENCOUNTER — Encounter: Payer: Self-pay | Admitting: Internal Medicine

## 2020-05-06 DIAGNOSIS — R000 Tachycardia, unspecified: Secondary | ICD-10-CM

## 2020-05-06 LAB — GLUCOSE, CAPILLARY
Glucose-Capillary: 283 mg/dL — ABNORMAL HIGH (ref 70–99)
Glucose-Capillary: 257 mg/dL — ABNORMAL HIGH (ref 70–99)

## 2020-05-06 LAB — MAGNESIUM: Magnesium: 1.3 mg/dL — ABNORMAL LOW (ref 1.7–2.4)

## 2020-05-06 LAB — BASIC METABOLIC PANEL
Anion gap: 6 (ref 5–15)
Anion gap: 7 (ref 5–15)
BUN: 50 mg/dL — ABNORMAL HIGH (ref 8–23)
BUN: 39 mg/dL — ABNORMAL HIGH (ref 8–23)
CO2: 20 mmol/L — ABNORMAL LOW (ref 22–32)
CO2: 20 mmol/L — ABNORMAL LOW (ref 22–32)
Calcium: 8.5 mg/dL — ABNORMAL LOW (ref 8.9–10.3)
Calcium: 8.5 mg/dL — ABNORMAL LOW (ref 8.9–10.3)
Chloride: 114 mmol/L — ABNORMAL HIGH (ref 98–111)
Chloride: 111 mmol/L (ref 98–111)
Creatinine, Ser: 1.71 mg/dL — ABNORMAL HIGH (ref 0.44–1.00)
Creatinine, Ser: 1.7 mg/dL — ABNORMAL HIGH (ref 0.44–1.00)
GFR calc Af Amer: 37 mL/min — ABNORMAL LOW (ref >60)
GFR calc Af Amer: 37 mL/min — ABNORMAL LOW (ref >60)
GFR calc non Af Amer: 32 mL/min — ABNORMAL LOW (ref >60)
GFR calc non Af Amer: 32 mL/min — ABNORMAL LOW (ref >60)
Glucose, Bld: 149 mg/dL — ABNORMAL HIGH (ref 70–99)
Glucose, Bld: 305 mg/dL — ABNORMAL HIGH (ref 70–99)
Potassium: 4.6 mmol/L (ref 3.5–5.1)
Potassium: 4.3 mmol/L (ref 3.5–5.1)
Sodium: 140 mmol/L (ref 135–145)
Sodium: 138 mmol/L (ref 135–145)

## 2020-05-06 LAB — CBC
HCT: 32.4 % — ABNORMAL LOW (ref 36.0–46.0)
Hemoglobin: 10.2 g/dL — ABNORMAL LOW (ref 12.0–15.0)
MCH: 27.3 pg (ref 26.0–34.0)
MCHC: 31.5 g/dL (ref 30.0–36.0)
MCV: 86.6 fL (ref 80.0–100.0)
Platelets: 261 10*3/uL (ref 150–400)
RBC: 3.74 MIL/uL — ABNORMAL LOW (ref 3.87–5.11)
RDW: 15.1 % (ref 11.5–15.5)
WBC: 15.4 10*3/uL — ABNORMAL HIGH (ref 4.0–10.5)
nRBC: 0 % (ref 0.0–0.2)

## 2020-05-06 LAB — CBC WITH DIFFERENTIAL/PLATELET
Abs Immature Granulocytes: 0.15 10*3/uL — ABNORMAL HIGH (ref 0.00–0.07)
Basophils Absolute: 0.1 10*3/uL (ref 0.0–0.1)
Basophils Relative: 0 %
Eosinophils Absolute: 0.1 10*3/uL (ref 0.0–0.5)
Eosinophils Relative: 0 %
HCT: 35.4 % — ABNORMAL LOW (ref 36.0–46.0)
Hemoglobin: 11.6 g/dL — ABNORMAL LOW (ref 12.0–15.0)
Immature Granulocytes: 1 %
Lymphocytes Relative: 23 %
Lymphs Abs: 3.8 10*3/uL (ref 0.7–4.0)
MCH: 27.6 pg (ref 26.0–34.0)
MCHC: 32.8 g/dL (ref 30.0–36.0)
MCV: 84.3 fL (ref 80.0–100.0)
Monocytes Absolute: 1.1 10*3/uL — ABNORMAL HIGH (ref 0.1–1.0)
Monocytes Relative: 7 %
Neutro Abs: 11.6 10*3/uL — ABNORMAL HIGH (ref 1.7–7.7)
Neutrophils Relative %: 69 %
Platelets: 284 10*3/uL (ref 150–400)
RBC: 4.2 MIL/uL (ref 3.87–5.11)
RDW: 15.5 % (ref 11.5–15.5)
WBC: 16.8 10*3/uL — ABNORMAL HIGH (ref 4.0–10.5)
nRBC: 0 % (ref 0.0–0.2)

## 2020-05-06 LAB — URINALYSIS, COMPLETE (UACMP) WITH MICROSCOPIC
Bilirubin Urine: NEGATIVE
Glucose, UA: >=500 mg/dL — AB
Ketones, ur: NEGATIVE mg/dL
Nitrite: NEGATIVE
Protein, ur: NEGATIVE mg/dL
Specific Gravity, Urine: 1.013 (ref 1.005–1.030)
pH: 5 (ref 5.0–8.0)

## 2020-05-06 LAB — TSH: TSH: 2.441 u[IU]/mL (ref 0.350–4.500)

## 2020-05-06 LAB — BRAIN NATRIURETIC PEPTIDE: B Natriuretic Peptide: 36.5 pg/mL (ref 0.0–100.0)

## 2020-05-06 LAB — TROPONIN I (HIGH SENSITIVITY): Troponin I (High Sensitivity): 12 ng/L (ref <18)

## 2020-05-06 LAB — LACTIC ACID, PLASMA: Lactic Acid, Venous: 1.8 mmol/L (ref 0.5–1.9)

## 2020-05-06 MED ORDER — MAGNESIUM SULFATE 4 GM/100ML IV SOLN
4.0000 g | Freq: Once | INTRAVENOUS | Status: DC
  Filled 2020-05-06: qty 100

## 2020-05-06 MED ORDER — LACTATED RINGERS IV BOLUS
500.0000 mL | Freq: Once | INTRAVENOUS | Status: AC
  Administered 2020-05-07: 01:00:00 500 mL via INTRAVENOUS

## 2020-05-06 MED ORDER — LACTATED RINGERS IV SOLN: INTRAVENOUS | Status: DC

## 2020-05-06 MED ORDER — SODIUM CHLORIDE 0.9 % IV SOLN
2.0000 g | Freq: Every day | INTRAVENOUS | Status: DC
  Administered 2020-05-06: 2 g via INTRAVENOUS
  Filled 2020-05-06: qty 2
  Filled 2020-05-06: qty 20

## 2020-05-06 MED ORDER — SENNOSIDES-DOCUSATE SODIUM 8.6-50 MG PO TABS
1.0000 | ORAL_TABLET | Freq: Once | ORAL | Status: AC
  Administered 2020-05-06: 09:00:00 1 via ORAL
  Filled 2020-05-06: qty 1

## 2020-05-06 MED ORDER — POLYETHYLENE GLYCOL 3350 17 G PO PACK
17.0000 g | PACK | Freq: Every day | ORAL | Status: DC
  Administered 2020-05-06 – 2020-05-07 (×2): 17 g via ORAL
  Filled 2020-05-06: qty 1

## 2020-05-06 NOTE — Progress Notes (Signed)
Patient hypotensive this shift, hospitalist notified, 549ml bolus given with improvement, will continue to monitor.

## 2020-05-06 NOTE — Progress Notes (Addendum)
Patient came out of room fully dressed saying she fell. She stated that she was talking to people standing all in her room, on the ceiling and there was no one in the room, she was afraid and called her family. She was confused, which is not her normal. She stated that she did not actually fall she almost fell because of the sheet that slipped off the bed and she urinated in the floor. Paged Dr. Mal Misty and he stated that he was not in the building to page the night person on call.  Paged night on call Randol Kern, NP, new orders entered.

## 2020-05-06 NOTE — Progress Notes (Addendum)
Progress Note    Lindsey [name]  CNO:[id number] DOB: [birth year]  DOA: 05/04/2020 PCP: Lindsey Marble, MD      Brief Narrative:    Medical records reviewed and are as summarized below:  Lindsey [name] is a 59 y.o. female with a known history of gastric bypass, peptic ulcer disease, COPD, diabetes, GERD, hypertension, hyperlipidemia, sleep apnea, CVA  presented to [name] with epigastric pain and black stools of 2 days duration.  She also complained of generalized weakness and lightheadedness.  She was hypotensive with systolic blood pressure in the 60s at home.      Assessment/Plan:   Principal Problem:   Upper GI bleed Active Problems:   AKI (acute kidney injury) ([place])   Acute upper GI bleeding, dysphagia, history of peptic ulcer disease: Patient underwent EGD (on 05/05/2020) which showed benign-appearing esophageal stenosis, non-severe esophagitis without bleeding and gastric bypass with a small-sized pouch and intact staple line. Gastrojejunal anastomosis characterized by healthy appearing mucosa. Mechanical soft diet was recommended.  Continue Protonix.  GI has signed off.  Acute kidney injury on CKD stage IV: Creatinine has improved to baseline.   Hypotension: BP has improved. Patient has mild tachycardia.  Hold antihypertensives for now.  Mild acute blood loss anemia: No indication for blood transfusion.  Monitor H&H.  Leukocytosis: Etiology unclear but it is improving.  Probably reactive.  Monitor CBC.  Type 2 diabetes mellitus: Metformin and Wilder Glade have been held.  NovoLog as needed for hyperglycemia  COPD: Compensated.  Continue bronchodilators  Depression: Continue mirtazapine  Arthritis, history of back surgery: Analgesics as needed for pain.  Body mass index is 39.63 kg/m.  (Morbid obesity)   Family Communication/Anticipated D/C date and plan/Code Status   DVT prophylaxis: SCD Code Status: Full code Family Communication: Plan discussed  with the patient Disposition Plan:    Status is: Inpatient  Remains inpatient appropriate because:Inpatient level of care appropriate due to severity of illness   Dispo: The patient is from: Home              Anticipated d/c is to: Home              Anticipated d/c date is: 1 day              Patient currently is not medically stable to d/c.           Subjective:   C/o epigastric pain and black stools. She is not comfortable going home because she still has pain and black stools  Objective:    Vitals:   05/05/20 2329 05/06/20 0231 05/06/20 0700 05/06/20 1523  BP: (!) 81/58 104/70 104/63 108/77  Pulse: 91 87 80 (!) 111  Resp: 18  14 18   Temp: 98.6 F (37 C) 98.3 F (36.8 C) 98 F (36.7 C) 97.9 F (36.6 C)  TempSrc: Oral Oral Oral Oral  SpO2: 100% 99% 100% 98%  Weight:      Height:       No data found.   Intake/Output Summary (Last 24 hours) at 05/06/2020 1603 Last data filed at 05/06/2020 0304 Gross per 24 hour  Intake 561.05 ml  Output --  Net 561.05 ml   Filed Weights   05/04/20 1634  Weight: 114.8 kg    Exam:  GEN: NAD SKIN: No rash EYES: EOMI ENT: MMM CV: RRR PULM: CTA B ABD: soft, obese, epigastric tenderness, +BS CNS: AAO x 3, non focal EXT: No edema or tenderness   Data  Reviewed:   I have personally reviewed following labs and imaging studies:  Labs: Labs show the following:   Basic Metabolic Panel: Recent Labs  Lab 05/04/20 1648 05/04/20 1648 05/05/20 0522 05/06/20 0600  NA 139  --  142 140  K 4.9   < > 4.6 4.6  CL 109  --  115* 114*  CO2 19*  --  19* 20*  GLUCOSE 164*  --  109* 149*  BUN 81*  --  76* 50*  CREATININE 4.41*  --  2.65* 1.71*  CALCIUM 9.0  --  8.5* 8.5*   < > = values in this interval not displayed.   GFR Estimated Creatinine Clearance: 45.2 mL/min (A) (by C-G formula based on SCr of 1.71 mg/dL (H)). Liver Function Tests: Recent Labs  Lab 05/04/20 1648  AST 22  ALT 21  ALKPHOS 138*  BILITOT  0.8  PROT 7.4  ALBUMIN 3.6   No results for input(s): LIPASE, AMYLASE in the last 168 hours. No results for input(s): AMMONIA in the last 168 hours. Coagulation profile Recent Labs  Lab 05/04/20 1648 05/04/20 2153  INR 1.0 1.0    CBC: Recent Labs  Lab 05/04/20 1648 05/04/20 2153 05/05/20 0522 05/06/20 0600  WBC 16.6* 18.0* 16.1* 15.4*  NEUTROABS 13.7*  --   --   --   HGB 12.3 11.6* 10.8* 10.2*  HCT 38.4 36.8 34.4* 32.4*  MCV 85.7 88.0 87.3 86.6  PLT 333 285 274 261   Cardiac Enzymes: No results for input(s): CKTOTAL, CKMB, CKMBINDEX, TROPONINI in the last 168 hours. BNP (last 3 results) No results for input(s): PROBNP in the last 8760 hours. CBG: Recent Labs  Lab 05/05/20 1554  GLUCAP 82   D-Dimer: No results for input(s): DDIMER in the last 72 hours. Hgb A1c: No results for input(s): HGBA1C in the last 72 hours. Lipid Profile: No results for input(s): CHOL, HDL, LDLCALC, TRIG, CHOLHDL, LDLDIRECT in the last 72 hours. Thyroid function studies: No results for input(s): TSH, T4TOTAL, T3FREE, THYROIDAB in the last 72 hours.  Invalid input(s): FREET3 Anemia work up: No results for input(s): VITAMINB12, FOLATE, FERRITIN, TIBC, IRON, RETICCTPCT in the last 72 hours. Sepsis Labs: Recent Labs  Lab 05/04/20 1648 05/04/20 2153 05/05/20 0522 05/06/20 0600  WBC 16.6* 18.0* 16.1* 15.4*    Microbiology Recent Results (from the past 240 hour(s))  SARS Coronavirus 2 by RT PCR ([name] order, performed in [name]) Nasopharyngeal Nasopharyngeal Swab     Status: None   Collection Time: 05/04/20  4:45 PM   Specimen: Nasopharyngeal Swab  Result Value Ref Range Status   SARS Coronavirus 2 NEGATIVE NEGATIVE Final    Comment: (NOTE) SARS-CoV-2 target nucleic acids are NOT DETECTED. The SARS-CoV-2 RNA is generally detectable in upper and lower respiratory specimens during the acute phase of infection. The lowest concentration of SARS-CoV-2 viral copies  this assay can detect is 250 copies / mL. A negative result does not preclude SARS-CoV-2 infection and should not be used as the sole basis for treatment or other patient management decisions.  A negative result may occur with improper specimen collection / handling, submission of specimen other than nasopharyngeal swab, presence of viral mutation(s) within the areas targeted by this assay, and inadequate number of viral copies (<250 copies / mL). A negative result must be combined with clinical observations, patient history, and epidemiological information. Fact Sheet for Patients:   StrictlyIdeas.no Fact Sheet for Healthcare Providers: BankingDealers.co.za This test is not yet approved  or cleared  by the [place] and has been authorized for detection and/or diagnosis of SARS-CoV-2 by FDA under an Emergency Use Authorization (EUA).  This EUA will remain in effect (meaning this test can be used) for the duration of the COVID-19 declaration under Section 564(b)(1) of the Act, 21 U.S.C. section 360bbb-3(b)(1), unless the authorization is terminated or revoked sooner. Performed at [facility], [address] [place], [place] [address]     Procedures and diagnostic studies:  CT Abdomen Pelvis Wo Contrast  Result Date: 05/04/2020 CLINICAL DATA:  Epigastric pain, dark stool, elevated white blood cell count EXAM: CT ABDOMEN AND PELVIS WITHOUT CONTRAST TECHNIQUE: Multidetector CT imaging of the abdomen and pelvis was performed following the standard protocol without IV contrast. COMPARISON:  05/03/2017 FINDINGS: Lower chest: No acute pleural or parenchymal lung disease. Hepatobiliary: No focal liver abnormality is seen. Status post cholecystectomy. No biliary dilatation. Pancreas: Unremarkable. No pancreatic ductal dilatation or surrounding inflammatory changes. Spleen: Normal in size without focal abnormality. Adrenals/Urinary  Tract: Bilateral adrenal nodules are again identified, previously shown to represent adrenal adenomas. No urinary tract calculi or obstructive uropathy. Bladder is decompressed which limits its evaluation. Stomach/Bowel: The colon is moderately distended with gas and stool. No wall thickening or inflammatory changes to suggest colitis. No small bowel wall thickening or inflammatory changes. Small bowel-small bowel anastomosis within the left mid abdomen is grossly unremarkable on this unenhanced exam. Postsurgical changes are seen from prior bariatric surgery. Hiatal hernia is stable. Vascular/Lymphatic: No significant vascular findings are present. No enlarged abdominal or pelvic lymph nodes. Reproductive: Status post hysterectomy. No adnexal masses. Other: Postsurgical changes from prior umbilical hernia repair. No free fluid or free gas. Musculoskeletal: Spinal stimulator is seen within the lower thoracic central canal. There are no acute or destructive bony lesions. Postsurgical changes are seen at the lumbosacral junction. Reconstructed images demonstrate no additional findings. IMPRESSION: 1. Moderate fecal retention. 2. Postsurgical changes from prior bariatric surgery, with stable small bowel-small bowel anastomosis within the left mid abdomen. No gross abnormalities on this unenhanced exam. 3. No acute intra-abdominal or intrapelvic process. Electronically Signed   By: Randa Ngo M.D.   On: 05/04/2020 17:48    Medications:   . mirtazapine  45 mg Oral QHS  . pantoprazole (PROTONIX) IV  40 mg Intravenous Q12H  . polyethylene glycol  17 g Oral Daily   Continuous Infusions:    LOS: 2 days   Kaydon Husby  [facility]     05/06/2020, 4:03 PM

## 2020-05-06 NOTE — Progress Notes (Signed)
Initial Nutrition Assessment  DOCUMENTATION CODES:   Obesity unspecified  INTERVENTION:  Patient reports her appetite is improving now and she does not want any interventions recommended by RD including oral nutrition supplements or snacks.  Encouraged adequate intake of protein at meals. Reviewed which foods contain protein.  Encouraged patient to continue taking bariatric multivitamin/minerals daily.  NUTRITION DIAGNOSIS:   Inadequate oral intake related to decreased appetite as evidenced by per patient/family report.  GOAL:   Patient will meet greater than or equal to 90% of their needs  MONITOR:   PO intake, Labs, Weight trends, I & O's  REASON FOR ASSESSMENT:   Malnutrition Screening Tool    ASSESSMENT:   59 year old female with PMHx of HTN, COPD, HLD, GERD, PUD, sleep apnea, hx CVA, DM, arthritis, hx gastric bypass who is admitted with upper GI bleeding, dysphagia, AKI on CKD IV.   -Patient underwent EGD yesterday that did not find any evidence of bleeding. Found benign-appearing esophageal stenosis and non-severe esophagitis with no bleeding.  Met with patient at bedside. She reports her appetite has been decreased for approximately 7-10 days now but is starting to improve. She was having difficulty swallowing but reports that has now improved. She is edentulous and reports she has upper and lower dentures. Patient denies any difficulty with chewing. She reports she ate well at breakfast and lunch today and finished 75-100% of trays. Discussed importance of adequate protein intake. She refuses any oral nutrition supplements and reports she cannot tolerate them. She had a gastric bypass in 2007 and reports she got tired of drinking oral nutrition supplements during recovery. She also refuses other interventions offered by RD including snacks. She reports she takes her bariatric multivitamin/minerals daily.  Patient reports her UBW prior to her gastric bypass was around 600  lbs. She felt like she has lost weight recently but is unsure of specifics. According to chart she was 117.9 kg on 08/17/2018. She is now 114.8 kg (253 lbs).  Medications reviewed and include: Remeron 45 mg QHS, Protonix, Miralax 17 grams daily.  Labs reviewed: CBG 82, chloride 114, CO2 20, BUN 50, Creatinine 1.71.  Patient does not meet criteria for malnutrition at this time.  NUTRITION - FOCUSED PHYSICAL EXAM:    Most Recent Value  Orbital Region No depletion  Upper Arm Region No depletion  Thoracic and Lumbar Region No depletion  Buccal Region No depletion  Temple Region No depletion  Clavicle Bone Region No depletion  Clavicle and Acromion Bone Region No depletion  Scapular Bone Region No depletion  Dorsal Hand Mild depletion  Patellar Region No depletion  Anterior Thigh Region No depletion  Posterior Calf Region No depletion  Edema (RD Assessment) None  Hair Reviewed  Eyes Reviewed  Mouth Reviewed  [edentulous]  Skin Reviewed  Nails Reviewed     Diet Order:   Diet Order            Diet - low sodium heart healthy           Diet Carb Modified           DIET SOFT Room service appropriate? Yes; Fluid consistency: Thin  Diet effective now                EDUCATION NEEDS:   Education needs have been addressed  Skin:  Skin Assessment: Reviewed RN Assessment  Last BM:  05/05/2020  Height:   Ht Readings from Last 1 Encounters:  05/04/20 '5\' 7"'$  (1.702 m)  Weight:   Wt Readings from Last 1 Encounters:  05/04/20 114.8 kg   BMI:  Body mass index is 39.63 kg/m.  Estimated Nutritional Needs:   Kcal:  2200-2400  Protein:  115-125 grams  Fluid:  >/= 2 L/day  Jacklynn Barnacle, MS, RD, LDN Pager number available on Amion

## 2020-05-06 NOTE — Progress Notes (Addendum)
Cross Cover Brief Note Nurse calls to report that patient became acutely confused and hallucinations saying there were people in her room, but no one present.  Patient admitted for GI bleed and acute kidney injury.  Creatinine noted to be significantly improved this am down to 1.71 (4.41 on admit, 2.65 yesterday).  Ongoing leukocytosis noted on CBC/  Hemoglobin has been stable.  Nurse reports black stools noted today,   Ongoing tachycardia noted with review of vitals  ECHO 7972 Grad I diastolic dysfunction EF 82%  Bedside. Patient alert and oriented.  Reports she Admits to urinary urgency is why she got up. Denies burning with urination or frequency.  Still endorses abdominal and back pain 8/10  Urinalysis shows many leukocytes and rare bacteria.  Nitrates are negative.  Given her leukocytosis, wil empiracally start on rocephin and await culture. Given LR bolus and started on maintenance fluid therapy  Mag level 1.3. Recheck level noon 6/11

## 2020-05-07 DIAGNOSIS — N390 Urinary tract infection, site not specified: Secondary | ICD-10-CM

## 2020-05-07 LAB — CBC WITH DIFFERENTIAL/PLATELET
Abs Immature Granulocytes: 0.18 10*3/uL — ABNORMAL HIGH (ref 0.00–0.07)
Basophils Absolute: 0.1 10*3/uL (ref 0.0–0.1)
Basophils Relative: 0 %
Eosinophils Absolute: 0.1 10*3/uL (ref 0.0–0.5)
Eosinophils Relative: 0 %
HCT: 35.5 % — ABNORMAL LOW (ref 36.0–46.0)
Hemoglobin: 11.3 g/dL — ABNORMAL LOW (ref 12.0–15.0)
Immature Granulocytes: 1 %
Lymphocytes Relative: 26 %
Lymphs Abs: 4.5 10*3/uL — ABNORMAL HIGH (ref 0.7–4.0)
MCH: 26.8 pg (ref 26.0–34.0)
MCHC: 31.8 g/dL (ref 30.0–36.0)
MCV: 84.3 fL (ref 80.0–100.0)
Monocytes Absolute: 1.1 10*3/uL — ABNORMAL HIGH (ref 0.1–1.0)
Monocytes Relative: 7 %
Neutro Abs: 11.2 10*3/uL — ABNORMAL HIGH (ref 1.7–7.7)
Neutrophils Relative %: 66 %
Platelets: 294 10*3/uL (ref 150–400)
RBC: 4.21 MIL/uL (ref 3.87–5.11)
RDW: 15.4 % (ref 11.5–15.5)
WBC: 17.2 10*3/uL — ABNORMAL HIGH (ref 4.0–10.5)
nRBC: 0.1 % (ref 0.0–0.2)

## 2020-05-07 LAB — GLUCOSE, CAPILLARY
Glucose-Capillary: 238 mg/dL — ABNORMAL HIGH (ref 70–99)
Glucose-Capillary: 304 mg/dL — ABNORMAL HIGH (ref 70–99)
Glucose-Capillary: 207 mg/dL — ABNORMAL HIGH (ref 70–99)

## 2020-05-07 LAB — TROPONIN I (HIGH SENSITIVITY): Troponin I (High Sensitivity): 12 ng/L (ref <18)

## 2020-05-07 LAB — MAGNESIUM: Magnesium: 1.9 mg/dL (ref 1.7–2.4)

## 2020-05-07 LAB — HEMOGLOBIN A1C
Hgb A1c MFr Bld: 9.5 % — ABNORMAL HIGH (ref 4.8–5.6)
Mean Plasma Glucose: 225.95 mg/dL

## 2020-05-07 LAB — LACTIC ACID, PLASMA: Lactic Acid, Venous: 1.5 mmol/L (ref 0.5–1.9)

## 2020-05-07 MED ORDER — HYDROXYZINE HCL 25 MG PO TABS
25.0000 mg | ORAL_TABLET | Freq: Three times a day (TID) | ORAL | Status: DC
  Filled 2020-05-07 (×2): qty 1

## 2020-05-07 MED ORDER — MAGNESIUM SULFATE 2 GM/50ML IV SOLN
2.0000 g | INTRAVENOUS | Status: AC
  Administered 2020-05-07 (×2): 2 g via INTRAVENOUS
  Filled 2020-05-07 (×2): qty 50

## 2020-05-07 MED ORDER — TAMSULOSIN HCL 0.4 MG PO CAPS
0.4000 mg | ORAL_CAPSULE | Freq: Every day | ORAL | Status: DC
  Administered 2020-05-07: 0.4 mg via ORAL
  Filled 2020-05-07: qty 1

## 2020-05-07 MED ORDER — INSULIN GLARGINE 100 UNIT/ML ~~LOC~~ SOLN
20.0000 [IU] | Freq: Every day | SUBCUTANEOUS | Status: DC
  Administered 2020-05-07: 14:00:00 20 [IU] via SUBCUTANEOUS
  Filled 2020-05-07 (×2): qty 0.2

## 2020-05-07 MED ORDER — BACLOFEN 10 MG PO TABS
10.0000 mg | ORAL_TABLET | Freq: Three times a day (TID) | ORAL | Status: DC
  Administered 2020-05-07: 10 mg via ORAL
  Filled 2020-05-07 (×2): qty 1

## 2020-05-07 MED ORDER — HALOPERIDOL LACTATE 5 MG/ML IJ SOLN: 5.0000 mg | Freq: Four times a day (QID) | INTRAMUSCULAR | Status: DC | PRN

## 2020-05-07 MED ORDER — SUCRALFATE 1 G PO TABS
1.0000 g | ORAL_TABLET | Freq: Three times a day (TID) | ORAL | Status: DC
  Administered 2020-05-07: 1 g via ORAL
  Filled 2020-05-07: qty 1

## 2020-05-07 MED ORDER — MAGNESIUM SULFATE 2 GM/50ML IV SOLN: 2.0000 g | Freq: Once | INTRAVENOUS | Status: DC

## 2020-05-07 MED ORDER — INSULIN ASPART 100 UNIT/ML ~~LOC~~ SOLN: 0.0000 [IU] | Freq: Every day | SUBCUTANEOUS | Status: DC

## 2020-05-07 MED ORDER — METOPROLOL SUCCINATE ER 50 MG PO TB24
100.0000 mg | ORAL_TABLET | Freq: Every day | ORAL | Status: DC
  Administered 2020-05-07: 13:00:00 100 mg via ORAL
  Filled 2020-05-07: qty 2

## 2020-05-07 MED ORDER — BUSPIRONE HCL 15 MG PO TABS
7.5000 mg | ORAL_TABLET | Freq: Two times a day (BID) | ORAL | Status: DC
  Administered 2020-05-07: 14:00:00 7.5 mg via ORAL
  Filled 2020-05-07 (×2): qty 1

## 2020-05-07 MED ORDER — DESVENLAFAXINE SUCCINATE ER 50 MG PO TB24
150.0000 mg | ORAL_TABLET | Freq: Every day | ORAL | Status: DC
  Administered 2020-05-07: 150 mg via ORAL
  Filled 2020-05-07 (×2): qty 1

## 2020-05-07 MED ORDER — INSULIN ASPART 100 UNIT/ML ~~LOC~~ SOLN
0.0000 [IU] | Freq: Three times a day (TID) | SUBCUTANEOUS | Status: DC
  Administered 2020-05-07: 19:00:00 7 [IU] via SUBCUTANEOUS
  Administered 2020-05-07: 13:00:00 15 [IU] via SUBCUTANEOUS
  Filled 2020-05-07 (×2): qty 1

## 2020-05-07 MED ORDER — FERROUS SULFATE 325 (65 FE) MG PO TABS
325.0000 mg | ORAL_TABLET | Freq: Two times a day (BID) | ORAL | Status: DC
  Administered 2020-05-07: 19:00:00 325 mg via ORAL
  Filled 2020-05-07: qty 1

## 2020-05-07 MED ORDER — SIMVASTATIN 20 MG PO TABS
40.0000 mg | ORAL_TABLET | Freq: Every day | ORAL | Status: DC
  Administered 2020-05-07: 19:00:00 40 mg via ORAL
  Filled 2020-05-07: qty 2

## 2020-05-07 NOTE — Progress Notes (Addendum)
Progress Note    Lindsey [name]  PJK:[id number] DOB: 12-Nov-1961  DOA: 05/04/2020 PCP: Jodi Marble, MD      Brief Narrative:    Medical records reviewed and are as summarized below:  Lindsey [name] is a 59 y.o. female with a known history of gastric bypass, peptic ulcer disease, COPD, diabetes, GERD, hypertension, hyperlipidemia, sleep apnea, CVA  presented to the hospital with epigastric pain and black stools of 2 days duration.  She also complained of generalized weakness and lightheadedness.  She was hypotensive with systolic blood pressure in the 60s at home.      Assessment/Plan:   Principal Problem:   Upper GI bleed Active Problems:   AKI (acute kidney injury) ([place])   Acute upper GI bleeding, dysphagia, history of peptic ulcer disease: Patient underwent EGD (on 05/05/2020) which showed benign-appearing esophageal stenosis, non-severe esophagitis without bleeding and gastric bypass with a small-sized pouch and intact staple line. Gastrojejunal anastomosis characterized by healthy appearing mucosa. Mechanical soft diet was recommended.  Continue Protonix.  GI has signed off.  Leukocytosis, probable UTI: Continue IV Rocephin.  Follow-up urine cultures.  Acute kidney injury on CKD stage IV: Creatinine has improved to baseline.   Hypotension: BP has improved.  Hypertension: Resume metoprolol  Sinus tachycardia: Heart rate is in the 130s.  Resume metoprolol.  Mild acute blood loss anemia: No indication for blood transfusion.  Monitor H&H.  Type 2 diabetes mellitus with hyperglycemia: Semaglutide, Metformin and Wilder Glade have been held.  Used Lantus and NovoLog for hyperglycemia.   COPD: Compensated.  Continue bronchodilators  Depression: Continue antidepressants.  Arthritis, history of back surgery: Analgesics as needed for pain.  Body mass index is 39.63 kg/m.  (Morbid obesity)   Family Communication/Anticipated D/C date and plan/Code Status   DVT  prophylaxis: SCD Code Status: Full code Family Communication: Plan discussed with the patient Disposition Plan:    Status is: Inpatient  Remains inpatient appropriate because:Inpatient level of care appropriate due to severity of illness   Dispo: The patient is from: Home              Anticipated d/c is to: Home              Anticipated d/c date is: 1 day              Patient currently is not medically stable to d/c.           Subjective:   No overnight events noted.  Patient had episodes of confusion and hallucination yesterday evening.  She complains of increased urinary frequency but no dysuria, fever or chills.  She feels better compared to yesterday.  Objective:    Vitals:   05/06/20 2354 05/07/20 0341 05/07/20 0738 05/07/20 1109  BP: 109/77 115/72 121/74 135/90  Pulse: (!) 104 (!) 109 (!) 108 (!) 136  Resp: 19 19 18 18   Temp: 98 F (36.7 C) 98.4 F (36.9 C) 98.2 F (36.8 C) 98.5 F (36.9 C)  TempSrc: Oral Oral  Oral  SpO2: 99% 97% 97% 100%  Weight:      Height:       No data found.   Intake/Output Summary (Last 24 hours) at 05/07/2020 1206 Last data filed at 05/07/2020 1020 Gross per 24 hour  Intake 1229.88 ml  Output --  Net 1229.88 ml   Filed Weights   05/04/20 1634  Weight: 114.8 kg    Exam:  GEN: NAD SKIN: No rash EYES: EOMI ENT: MMM  CV: RRR PULM: CTA B ABD: soft, obese, epigastric tenderness, +BS CNS: AAO x 3, non focal EXT: No edema or tenderness   Data Reviewed:   I have personally reviewed following labs and imaging studies:  Labs: Labs show the following:   Basic Metabolic Panel: Recent Labs  Lab 05/04/20 1648 05/04/20 1648 05/05/20 0522 05/05/20 0522 05/06/20 0600 05/06/20 2108  NA 139  --  142  --  140 138  K 4.9   < > 4.6   < > 4.6 4.3  CL 109  --  115*  --  114* 111  CO2 19*  --  19*  --  20* 20*  GLUCOSE 164*  --  109*  --  149* 305*  BUN 81*  --  76*  --  50* 39*  CREATININE 4.41*  --  2.65*  --  1.71*  1.70*  CALCIUM 9.0  --  8.5*  --  8.5* 8.5*  MG  --   --   --   --   --  1.3*   < > = values in this interval not displayed.   GFR Estimated Creatinine Clearance: 45.5 mL/min (A) (by C-G formula based on SCr of 1.7 mg/dL (H)). Liver Function Tests: Recent Labs  Lab 05/04/20 1648  AST 22  ALT 21  ALKPHOS 138*  BILITOT 0.8  PROT 7.4  ALBUMIN 3.6   No results for input(s): LIPASE, AMYLASE in the last 168 hours. No results for input(s): AMMONIA in the last 168 hours. Coagulation profile Recent Labs  Lab 05/04/20 1648 05/04/20 2153  INR 1.0 1.0    CBC: Recent Labs  Lab 05/04/20 1648 05/04/20 1648 05/04/20 2153 05/05/20 0522 05/06/20 0600 05/06/20 2108 05/07/20 0024  WBC 16.6*   < > 18.0* 16.1* 15.4* 16.8* 17.2*  NEUTROABS 13.7*  --   --   --   --  11.6* 11.2*  HGB 12.3   < > 11.6* 10.8* 10.2* 11.6* 11.3*  HCT 38.4   < > 36.8 34.4* 32.4* 35.4* 35.5*  MCV 85.7   < > 88.0 87.3 86.6 84.3 84.3  PLT 333   < > 285 274 261 284 294   < > = values in this interval not displayed.   Cardiac Enzymes: No results for input(s): CKTOTAL, CKMB, CKMBINDEX, TROPONINI in the last 168 hours. BNP (last 3 results) No results for input(s): PROBNP in the last 8760 hours. CBG: Recent Labs  Lab 05/05/20 1554 05/06/20 1839 05/06/20 1944 05/07/20 0840 05/07/20 1131  GLUCAP 82 283* 257* 238* 304*   D-Dimer: No results for input(s): DDIMER in the last 72 hours. Hgb A1c: Recent Labs    05/07/20 0024  HGBA1C 9.5*   Lipid Profile: No results for input(s): CHOL, HDL, LDLCALC, TRIG, CHOLHDL, LDLDIRECT in the last 72 hours. Thyroid function studies: Recent Labs    05/06/20 2108  TSH 2.441   Anemia work up: No results for input(s): VITAMINB12, FOLATE, FERRITIN, TIBC, IRON, RETICCTPCT in the last 72 hours. Sepsis Labs: Recent Labs  Lab 05/05/20 0522 05/06/20 0600 05/06/20 2108 05/07/20 0024  WBC 16.1* 15.4* 16.8* 17.2*  LATICACIDVEN  --   --  1.8 1.5     Microbiology Recent Results (from the past 240 hour(s))  SARS Coronavirus 2 by RT PCR (hospital order, performed in [facility] hospital lab) Nasopharyngeal Nasopharyngeal Swab     Status: None   Collection Time: 05/04/20  4:45 PM   Specimen: Nasopharyngeal Swab  Result Value Ref Range Status  SARS Coronavirus 2 NEGATIVE NEGATIVE Final    Comment: (NOTE) SARS-CoV-2 target nucleic acids are NOT DETECTED. The SARS-CoV-2 RNA is generally detectable in upper and lower respiratory specimens during the acute phase of infection. The lowest concentration of SARS-CoV-2 viral copies this assay can detect is 250 copies / mL. A negative result does not preclude SARS-CoV-2 infection and should not be used as the sole basis for treatment or other patient management decisions.  A negative result may occur with improper specimen collection / handling, submission of specimen other than nasopharyngeal swab, presence of viral mutation(s) within the areas targeted by this assay, and inadequate number of viral copies (<250 copies / mL). A negative result must be combined with clinical observations, patient history, and epidemiological information. Fact Sheet for Patients:   StrictlyIdeas.no Fact Sheet for Healthcare Providers: BankingDealers.co.za This test is not yet approved or cleared  by the [place] FDA and has been authorized for detection and/or diagnosis of SARS-CoV-2 by FDA under an Emergency Use Authorization (EUA).  This EUA will remain in effect (meaning this test can be used) for the duration of the COVID-19 declaration under Section 564(b)(1) of the Act, 21 U.S.C. section 360bbb-3(b)(1), unless the authorization is terminated or revoked sooner. Performed at [facility], [address] [place], [place] [address]     Procedures and diagnostic studies:  No results found.  Medications:   . baclofen  10 mg Oral TID   . busPIRone  7.5 mg Oral BID  . desvenlafaxine  150 mg Oral Daily  . ferrous sulfate  325 mg Oral BID WC  . hydrOXYzine  25 mg Oral TID  . insulin aspart  0-20 Units Subcutaneous TID WC  . insulin aspart  0-5 Units Subcutaneous QHS  . insulin glargine  20 Units Subcutaneous Daily  . metoprolol succinate  100 mg Oral Daily  . mirtazapine  45 mg Oral QHS  . pantoprazole (PROTONIX) IV  40 mg Intravenous Q12H  . polyethylene glycol  17 g Oral Daily  . simvastatin  40 mg Oral q1800  . sucralfate  1 g Oral TID WC & HS  . tamsulosin  0.4 mg Oral QPC supper   Continuous Infusions: . cefTRIAXone (ROCEPHIN)  IV Stopped (05/06/20 2327)     LOS: 3 days   Thimothy Barretta  [facility]     05/07/2020, 12:06 PM

## 2020-05-07 NOTE — Progress Notes (Signed)
   05/07/20 1109  Assess: MEWS Score  Temp 98.5 F (36.9 C)  BP 135/90  Pulse Rate (!) 136  ECG Heart Rate (!) 139  Resp 18  Level of Consciousness Alert  SpO2 100 %  O2 Device Room Air  Patient Activity (if Appropriate) In bed  Assess: MEWS Score  MEWS Temp 0  MEWS Systolic 0  MEWS Pulse 3  MEWS RR 0  MEWS LOC 0  MEWS Score 3  MEWS Score Color Yellow  Assess: if the MEWS score is Yellow or Red  Were vital signs taken at a resting state? Yes  Focused Assessment Documented focused assessment  Early Detection of Sepsis Score *See Row Information* Medium  MEWS guidelines implemented *See Row Information* Yes  Treat  MEWS Interventions Administered scheduled meds/treatments  Take Vital Signs  Increase Vital Sign Frequency  Yellow: Q 2hr X 2 then Q 4hr X 2, if remains yellow, continue Q 4hrs  Escalate  MEWS: Escalate Yellow: discuss with charge nurse/RN and consider discussing with provider and RRT  Notify: Charge Nurse/RN  Name of Charge Nurse/RN Notified Doloris Hall  Date Charge Nurse/RN Notified 05/07/20  Time Charge Nurse/RN Notified 1115  Notify: Provider  Provider Name/Title Dr. Mal Misty  Date Provider Notified 05/07/20  Time Provider Notified 1130  Notification Type Call  Notification Reason Change in status  Response See new orders  Date of Provider Response 05/07/20  Time of Provider Response 1140  Document  Patient Outcome Other (Comment) (Continue to monitor)  Progress note created (see row info) Yes

## 2020-05-07 NOTE — Care Management Important Message (Signed)
Important Message  Patient Details  Name: Lindsey [name] MRN: [id number] Date of Birth: 05-16-1961   Medicare Important Message Given:  Yes     [name] 05/07/2020, 11:32 AM

## 2020-05-07 NOTE — Progress Notes (Signed)
Pt a Yellow MEWS Temp 98.5 oral BP 135/90 HR 136 Resp Rate 18  Sating 100% on RA Pt is alert and oriented Pt in bed  MD notified of pt HR. Ordered for pts home med dose of metoprolol ordered. Will give pt medication and continue to monitor.

## 2020-05-07 NOTE — Progress Notes (Signed)
   05/07/20 1900  Assess: MEWS Score  ECG Heart Rate (!) 129  Assess: MEWS Score  MEWS Temp 0  MEWS Systolic 0  MEWS Pulse 2  MEWS RR 0  MEWS LOC 0  MEWS Score 2  MEWS Score Color Yellow  Notify: Charge Nurse/RN  Name of Charge Nurse/RN Notified Phyllis, RN  Date Charge Nurse/RN Notified 05/07/20  Time Charge Nurse/RN Notified 1900  Notify: Provider  Provider Name/Title Kennon Holter, NP  Date Provider Notified 05/07/20  Time Provider Notified [phone]  Notification Type Call  Response See new orders  Date of Provider Response 04/30/20  Time of Provider Response 1930  Pt's HR elevated b/c of agitation.  Ended up calling a Code 300.  Pt left AMA w/son, Lindsey [name].

## 2020-05-07 NOTE — Plan of Care (Signed)
At shift change, pt became very confused, agitated and uncooperative.  She had allowed IV team to put an IV in just before, but at handoff she was trying to get dressed and leave.  She was talking to her purse, didn't know where she was and wouldn't let us care for her.  Heart rate was elevated.  We tried to calm pt, but weren't successful and had to call a code 300.  Called her son who came over. Still couldn't calm pt and she wanted to leave.  Son agreed to take her home.  Explained the consequences of leaving AMA.  AC removed IVs.  Security guard and I walked her to the car.

## 2020-05-07 NOTE — Progress Notes (Signed)
This nurse entered pts room at approx. 1845 to give pt evening medications and do a last check on her. Pt alert and oriented at this time. Laying calmly in bed. Pt asked about night time medications and was able to verbalize all medications she takes at night. I was able to verify with her at that time that those were the medications she would be receiving. Pt took medications without problem. Pt stated she had no more needs at that time.   This nurse was called to pts room by a NT approx.10 minutes later due to pt yelling and refusing to listen. When I enter pts rooms I found her at the bathroom door with her tele cords pulled taut as they were still at the bedside. Pt was not making sense and kept saying that a family member was here and was taking her home. She was difficult to redirect at that time, but did get back in the bed and allowed IV team to place new IV. Called back to pts room approximately ten minutes later due to pt bed alarm going off. Pt was pulling at all of her stuff, trying to get dressed and saying that she was leaving. She was not redirectable at all at this time. Code 300 called.

## 2020-05-08 DIAGNOSIS — N390 Urinary tract infection, site not specified: Secondary | ICD-10-CM

## 2020-05-08 NOTE — Discharge Summary (Addendum)
This is a late entry.  I discovered that patient had left AGAINST MEDICAL ADVICE on 05/07/2020 when I came to work the following day on 05/08/2020.  Chart reviewed showed that patient became agitated and was difficult to reorient. She was taken home by her son [place].

## 2020-05-09 LAB — URINE CULTURE: Culture: >=100000 — AB

## 2020-05-13 NOTE — Anesthesia Postprocedure Evaluation (Signed)
Anesthesia Post Note  Patient: Lindsey [name]  Procedure(s) Performed: ESOPHAGOGASTRODUODENOSCOPY (EGD) (N/A )  Patient location during evaluation: PACU Anesthesia Type: General Level of consciousness: awake and alert Pain management: pain level controlled Vital Signs Assessment: post-procedure vital signs reviewed and stable Respiratory status: spontaneous breathing, nonlabored ventilation, respiratory function stable and patient connected to nasal cannula oxygen Cardiovascular status: blood pressure returned to baseline and stable Postop Assessment: no apparent nausea or vomiting Anesthetic complications: no   No complications documented.   Last Vitals:  Vitals:   05/07/20 1400 05/07/20 1600  BP: 126/69 128/72  Pulse: (!) 105 (!) 103  Resp: 19 20  Temp: 36.9 C 36.9 C  SpO2: 98% 98%    Last Pain:  Vitals:   05/07/20 1600  TempSrc: Oral  PainSc:                  Molli Barrows

## 2021-03-04 DIAGNOSIS — R0902 Hypoxemia: Secondary | ICD-10-CM | POA: Diagnosis not present

## 2021-03-18 DIAGNOSIS — G8929 Other chronic pain: Secondary | ICD-10-CM | POA: Diagnosis not present

## 2021-03-18 DIAGNOSIS — I639 Cerebral infarction, unspecified: Secondary | ICD-10-CM | POA: Diagnosis not present

## 2021-03-18 DIAGNOSIS — G4700 Insomnia, unspecified: Secondary | ICD-10-CM | POA: Diagnosis not present

## 2021-03-18 DIAGNOSIS — E7800 Pure hypercholesterolemia, unspecified: Secondary | ICD-10-CM | POA: Diagnosis not present

## 2021-03-18 DIAGNOSIS — G43009 Migraine without aura, not intractable, without status migrainosus: Secondary | ICD-10-CM | POA: Diagnosis not present

## 2021-03-18 DIAGNOSIS — E119 Type 2 diabetes mellitus without complications: Secondary | ICD-10-CM | POA: Diagnosis not present

## 2021-04-03 DIAGNOSIS — R0902 Hypoxemia: Secondary | ICD-10-CM | POA: Diagnosis not present

## 2021-04-15 DIAGNOSIS — E119 Type 2 diabetes mellitus without complications: Secondary | ICD-10-CM | POA: Diagnosis not present

## 2021-04-20 DIAGNOSIS — E119 Type 2 diabetes mellitus without complications: Secondary | ICD-10-CM | POA: Diagnosis not present

## 2021-04-20 DIAGNOSIS — I639 Cerebral infarction, unspecified: Secondary | ICD-10-CM | POA: Diagnosis not present

## 2021-04-20 DIAGNOSIS — G4700 Insomnia, unspecified: Secondary | ICD-10-CM | POA: Diagnosis not present

## 2021-04-20 DIAGNOSIS — G43009 Migraine without aura, not intractable, without status migrainosus: Secondary | ICD-10-CM | POA: Diagnosis not present

## 2021-04-20 DIAGNOSIS — G8929 Other chronic pain: Secondary | ICD-10-CM | POA: Diagnosis not present

## 2021-04-20 DIAGNOSIS — J45998 Other asthma: Secondary | ICD-10-CM | POA: Diagnosis not present

## 2021-05-04 DIAGNOSIS — R0902 Hypoxemia: Secondary | ICD-10-CM | POA: Diagnosis not present

## 2021-05-20 DIAGNOSIS — I639 Cerebral infarction, unspecified: Secondary | ICD-10-CM | POA: Diagnosis not present

## 2021-05-20 DIAGNOSIS — G4700 Insomnia, unspecified: Secondary | ICD-10-CM | POA: Diagnosis not present

## 2021-05-20 DIAGNOSIS — G43009 Migraine without aura, not intractable, without status migrainosus: Secondary | ICD-10-CM | POA: Diagnosis not present

## 2021-05-20 DIAGNOSIS — G8929 Other chronic pain: Secondary | ICD-10-CM | POA: Diagnosis not present

## 2021-05-20 DIAGNOSIS — E119 Type 2 diabetes mellitus without complications: Secondary | ICD-10-CM | POA: Diagnosis not present

## 2021-06-03 DIAGNOSIS — R0902 Hypoxemia: Secondary | ICD-10-CM | POA: Diagnosis not present

## 2021-06-05 ENCOUNTER — Emergency Department: Payer: Medicare Other

## 2021-06-05 ENCOUNTER — Other Ambulatory Visit: Payer: Self-pay

## 2021-06-05 ENCOUNTER — Inpatient Hospital Stay
Admission: EM | Admit: 2021-06-05 | Discharge: 2021-06-07 | DRG: 917 | Disposition: A | Discharge status: Home / Self Care | Payer: Medicare Other | Attending: Internal Medicine | Admitting: Internal Medicine

## 2021-06-05 DIAGNOSIS — E1169 Type 2 diabetes mellitus with other specified complication: Secondary | ICD-10-CM | POA: Diagnosis present

## 2021-06-05 DIAGNOSIS — R404 Transient alteration of awareness: Secondary | ICD-10-CM | POA: Diagnosis not present

## 2021-06-05 DIAGNOSIS — E1122 Type 2 diabetes mellitus with diabetic chronic kidney disease: Secondary | ICD-10-CM | POA: Diagnosis not present

## 2021-06-05 DIAGNOSIS — J9602 Acute respiratory failure with hypercapnia: Secondary | ICD-10-CM | POA: Diagnosis not present

## 2021-06-05 DIAGNOSIS — J9600 Acute respiratory failure, unspecified whether with hypoxia or hypercapnia: Secondary | ICD-10-CM | POA: Diagnosis present

## 2021-06-05 DIAGNOSIS — Z6841 Body Mass Index (BMI) 40.0 and over, adult: Secondary | ICD-10-CM | POA: Diagnosis not present

## 2021-06-05 DIAGNOSIS — I959 Hypotension, unspecified: Secondary | ICD-10-CM | POA: Diagnosis not present

## 2021-06-05 DIAGNOSIS — R569 Unspecified convulsions: Secondary | ICD-10-CM | POA: Diagnosis not present

## 2021-06-05 DIAGNOSIS — Z79899 Other long term (current) drug therapy: Secondary | ICD-10-CM

## 2021-06-05 DIAGNOSIS — J9601 Acute respiratory failure with hypoxia: Secondary | ICD-10-CM | POA: Diagnosis not present

## 2021-06-05 DIAGNOSIS — R7989 Other specified abnormal findings of blood chemistry: Secondary | ICD-10-CM | POA: Diagnosis not present

## 2021-06-05 DIAGNOSIS — Z8249 Family history of ischemic heart disease and other diseases of the circulatory system: Secondary | ICD-10-CM | POA: Diagnosis not present

## 2021-06-05 DIAGNOSIS — Z743 Need for continuous supervision: Secondary | ICD-10-CM | POA: Diagnosis not present

## 2021-06-05 DIAGNOSIS — Y92002 Bathroom of unspecified non-institutional (private) residence single-family (private) house as the place of occurrence of the external cause: Secondary | ICD-10-CM | POA: Diagnosis not present

## 2021-06-05 DIAGNOSIS — Z91013 Allergy to seafood: Secondary | ICD-10-CM

## 2021-06-05 DIAGNOSIS — T402X1A Poisoning by other opioids, accidental (unintentional), initial encounter: Secondary | ICD-10-CM | POA: Diagnosis not present

## 2021-06-05 DIAGNOSIS — M1990 Unspecified osteoarthritis, unspecified site: Secondary | ICD-10-CM | POA: Diagnosis not present

## 2021-06-05 DIAGNOSIS — E785 Hyperlipidemia, unspecified: Secondary | ICD-10-CM | POA: Diagnosis not present

## 2021-06-05 DIAGNOSIS — E119 Type 2 diabetes mellitus without complications: Secondary | ICD-10-CM | POA: Diagnosis not present

## 2021-06-05 DIAGNOSIS — Z885 Allergy status to narcotic agent status: Secondary | ICD-10-CM

## 2021-06-05 DIAGNOSIS — Z90710 Acquired absence of both cervix and uterus: Secondary | ICD-10-CM | POA: Diagnosis not present

## 2021-06-05 DIAGNOSIS — E1165 Type 2 diabetes mellitus with hyperglycemia: Secondary | ICD-10-CM | POA: Diagnosis not present

## 2021-06-05 DIAGNOSIS — I1310 Hypertensive heart and chronic kidney disease without heart failure, with stage 1 through stage 4 chronic kidney disease, or unspecified chronic kidney disease: Secondary | ICD-10-CM | POA: Diagnosis not present

## 2021-06-05 DIAGNOSIS — N1832 Chronic kidney disease, stage 3b: Secondary | ICD-10-CM | POA: Diagnosis present

## 2021-06-05 DIAGNOSIS — R531 Weakness: Secondary | ICD-10-CM | POA: Diagnosis not present

## 2021-06-05 DIAGNOSIS — Z20822 Contact with and (suspected) exposure to covid-19: Secondary | ICD-10-CM | POA: Diagnosis present

## 2021-06-05 DIAGNOSIS — G8929 Other chronic pain: Secondary | ICD-10-CM | POA: Diagnosis not present

## 2021-06-05 DIAGNOSIS — R0603 Acute respiratory distress: Secondary | ICD-10-CM | POA: Diagnosis not present

## 2021-06-05 DIAGNOSIS — Z888 Allergy status to other drugs, medicaments and biological substances status: Secondary | ICD-10-CM

## 2021-06-05 DIAGNOSIS — Z825 Family history of asthma and other chronic lower respiratory diseases: Secondary | ICD-10-CM | POA: Diagnosis not present

## 2021-06-05 DIAGNOSIS — R4020 Unspecified coma: Secondary | ICD-10-CM | POA: Diagnosis not present

## 2021-06-05 DIAGNOSIS — Z8673 Personal history of transient ischemic attack (TIA), and cerebral infarction without residual deficits: Secondary | ICD-10-CM | POA: Diagnosis not present

## 2021-06-05 DIAGNOSIS — I252 Old myocardial infarction: Secondary | ICD-10-CM

## 2021-06-05 DIAGNOSIS — Z886 Allergy status to analgesic agent status: Secondary | ICD-10-CM

## 2021-06-05 DIAGNOSIS — R402432 Glasgow coma scale score 3-8, at arrival to emergency department: Secondary | ICD-10-CM

## 2021-06-05 DIAGNOSIS — I517 Cardiomegaly: Secondary | ICD-10-CM | POA: Diagnosis not present

## 2021-06-05 DIAGNOSIS — N179 Acute kidney failure, unspecified: Secondary | ICD-10-CM

## 2021-06-05 DIAGNOSIS — J449 Chronic obstructive pulmonary disease, unspecified: Secondary | ICD-10-CM | POA: Diagnosis not present

## 2021-06-05 DIAGNOSIS — R6889 Other general symptoms and signs: Secondary | ICD-10-CM | POA: Diagnosis not present

## 2021-06-05 DIAGNOSIS — Z7984 Long term (current) use of oral hypoglycemic drugs: Secondary | ICD-10-CM

## 2021-06-05 DIAGNOSIS — N189 Chronic kidney disease, unspecified: Secondary | ICD-10-CM

## 2021-06-05 DIAGNOSIS — J9621 Acute and chronic respiratory failure with hypoxia: Secondary | ICD-10-CM | POA: Diagnosis not present

## 2021-06-05 DIAGNOSIS — E872 Acidosis, unspecified: Secondary | ICD-10-CM

## 2021-06-05 DIAGNOSIS — Z8711 Personal history of peptic ulcer disease: Secondary | ICD-10-CM

## 2021-06-05 DIAGNOSIS — I10 Essential (primary) hypertension: Secondary | ICD-10-CM | POA: Diagnosis not present

## 2021-06-05 DIAGNOSIS — R739 Hyperglycemia, unspecified: Secondary | ICD-10-CM | POA: Diagnosis not present

## 2021-06-05 DIAGNOSIS — J9622 Acute and chronic respiratory failure with hypercapnia: Secondary | ICD-10-CM | POA: Diagnosis not present

## 2021-06-05 DIAGNOSIS — R7401 Elevation of levels of liver transaminase levels: Secondary | ICD-10-CM

## 2021-06-05 DIAGNOSIS — D72829 Elevated white blood cell count, unspecified: Secondary | ICD-10-CM | POA: Diagnosis present

## 2021-06-05 DIAGNOSIS — Z91041 Radiographic dye allergy status: Secondary | ICD-10-CM

## 2021-06-05 LAB — CBC WITH DIFFERENTIAL/PLATELET
Abs Immature Granulocytes: 0.11 10*3/uL — ABNORMAL HIGH (ref 0.00–0.07)
Basophils Absolute: 0.1 10*3/uL (ref 0.0–0.1)
Basophils Relative: 1 %
Eosinophils Absolute: 0.2 10*3/uL (ref 0.0–0.5)
Eosinophils Relative: 1 %
HCT: 40.1 % (ref 36.0–46.0)
Hemoglobin: 12.4 g/dL (ref 12.0–15.0)
Immature Granulocytes: 1 %
Lymphocytes Relative: 46 %
Lymphs Abs: 7.9 10*3/uL — ABNORMAL HIGH (ref 0.7–4.0)
MCH: 28.6 pg (ref 26.0–34.0)
MCHC: 30.9 g/dL (ref 30.0–36.0)
MCV: 92.4 fL (ref 80.0–100.0)
Monocytes Absolute: 0.7 10*3/uL (ref 0.1–1.0)
Monocytes Relative: 4 %
Neutro Abs: 8.1 10*3/uL — ABNORMAL HIGH (ref 1.7–7.7)
Neutrophils Relative %: 47 %
Platelets: 338 10*3/uL (ref 150–400)
RBC: 4.34 MIL/uL (ref 3.87–5.11)
RDW: 14.1 % (ref 11.5–15.5)
Smear Review: NORMAL
WBC: 17 10*3/uL — ABNORMAL HIGH (ref 4.0–10.5)
nRBC: 0 % (ref 0.0–0.2)

## 2021-06-05 LAB — RESP PANEL BY RT-PCR (FLU A&B, COVID) ARPGX2
Influenza A by PCR: NEGATIVE
Influenza B by PCR: NEGATIVE
SARS Coronavirus 2 by RT PCR: NEGATIVE

## 2021-06-05 LAB — URINALYSIS, COMPLETE (UACMP) WITH MICROSCOPIC
Bacteria, UA: NONE SEEN
Bilirubin Urine: NEGATIVE
Glucose, UA: >=500 mg/dL — AB
Hgb urine dipstick: NEGATIVE
Ketones, ur: NEGATIVE mg/dL
Leukocytes,Ua: NEGATIVE
Nitrite: NEGATIVE
Protein, ur: 30 mg/dL — AB
Specific Gravity, Urine: 1.026 (ref 1.005–1.030)
pH: 5 (ref 5.0–8.0)

## 2021-06-05 LAB — BLOOD GAS, ARTERIAL
Acid-base deficit: 8 mmol/L — ABNORMAL HIGH (ref 0.0–2.0)
Bicarbonate: 16.9 mmol/L — ABNORMAL LOW (ref 20.0–28.0)
FIO2: 40
MECHVT: 500 mL
Mechanical Rate: 16
O2 Saturation: 99.4 %
PEEP: 5 cmH2O
Patient temperature: 37
pCO2 arterial: 32 mmHg (ref 32.0–48.0)
pH, Arterial: 7.33 — ABNORMAL LOW (ref 7.350–7.450)
pO2, Arterial: 170 mmHg — ABNORMAL HIGH (ref 83.0–108.0)

## 2021-06-05 LAB — COMPREHENSIVE METABOLIC PANEL
ALT: 50 U/L — ABNORMAL HIGH (ref 0–44)
AST: 69 U/L — ABNORMAL HIGH (ref 15–41)
Albumin: 3.2 g/dL — ABNORMAL LOW (ref 3.5–5.0)
Alkaline Phosphatase: 106 U/L (ref 38–126)
Anion gap: 7 (ref 5–15)
BUN: 36 mg/dL — ABNORMAL HIGH (ref 8–23)
CO2: 19 mmol/L — ABNORMAL LOW (ref 22–32)
Calcium: 8.1 mg/dL — ABNORMAL LOW (ref 8.9–10.3)
Chloride: 108 mmol/L (ref 98–111)
Creatinine, Ser: 2.42 mg/dL — ABNORMAL HIGH (ref 0.44–1.00)
GFR, Estimated: 22 mL/min — ABNORMAL LOW (ref >60)
Glucose, Bld: 333 mg/dL — ABNORMAL HIGH (ref 70–99)
Potassium: 4.9 mmol/L (ref 3.5–5.1)
Sodium: 134 mmol/L — ABNORMAL LOW (ref 135–145)
Total Bilirubin: 1 mg/dL (ref 0.3–1.2)
Total Protein: 6.4 g/dL — ABNORMAL LOW (ref 6.5–8.1)

## 2021-06-05 LAB — BLOOD GAS, VENOUS
Acid-base deficit: 10.5 mmol/L — ABNORMAL HIGH (ref 0.0–2.0)
Bicarbonate: 19.4 mmol/L — ABNORMAL LOW (ref 20.0–28.0)
FIO2: 0.4
MECHVT: 500 mL
O2 Saturation: 75.1 %
PEEP: 5 cmH2O
Patient temperature: 37
RATE: 16 resp/min
pCO2, Ven: 61 mmHg — ABNORMAL HIGH (ref 44.0–60.0)
pH, Ven: 7.11 — CL (ref 7.250–7.430)
pO2, Ven: 55 mmHg — ABNORMAL HIGH (ref 32.0–45.0)

## 2021-06-05 LAB — ETHANOL: Alcohol, Ethyl (B): <10 mg/dL (ref <10)

## 2021-06-05 LAB — BETA-HYDROXYBUTYRIC ACID: Beta-Hydroxybutyric Acid: 0.12 mmol/L (ref 0.05–0.27)

## 2021-06-05 LAB — LACTIC ACID, PLASMA
Lactic Acid, Venous: 2.8 mmol/L (ref 0.5–1.9)
Lactic Acid, Venous: 2 mmol/L (ref 0.5–1.9)

## 2021-06-05 LAB — GLUCOSE, CAPILLARY
Glucose-Capillary: 297 mg/dL — ABNORMAL HIGH (ref 70–99)
Glucose-Capillary: 228 mg/dL — ABNORMAL HIGH (ref 70–99)

## 2021-06-05 LAB — BRAIN NATRIURETIC PEPTIDE: B Natriuretic Peptide: 17.5 pg/mL (ref 0.0–100.0)

## 2021-06-05 LAB — URINE DRUG SCREEN, QUALITATIVE (ARMC ONLY)
Amphetamines, Ur Screen: NOT DETECTED
Barbiturates, Ur Screen: NOT DETECTED
Benzodiazepine, Ur Scrn: POSITIVE — AB
Cannabinoid 50 Ng, Ur ~~LOC~~: NOT DETECTED
Cocaine Metabolite,Ur ~~LOC~~: NOT DETECTED
MDMA (Ecstasy)Ur Screen: NOT DETECTED
Methadone Scn, Ur: NOT DETECTED
Opiate, Ur Screen: POSITIVE — AB
Phencyclidine (PCP) Ur S: NOT DETECTED
Tricyclic, Ur Screen: POSITIVE — AB

## 2021-06-05 LAB — OSMOLALITY: Osmolality: 312 mOsm/kg — ABNORMAL HIGH (ref 275–295)

## 2021-06-05 LAB — MRSA NEXT GEN BY PCR, NASAL: MRSA by PCR Next Gen: NOT DETECTED

## 2021-06-05 LAB — CBG MONITORING, ED: Glucose-Capillary: 373 mg/dL — ABNORMAL HIGH (ref 70–99)

## 2021-06-05 MED ORDER — ETOMIDATE 2 MG/ML IV SOLN
15.0000 mg | Freq: Once | INTRAVENOUS | Status: AC
  Administered 2021-06-05: 15 mg via INTRAVENOUS

## 2021-06-05 MED ORDER — PROPOFOL 1000 MG/100ML IV EMUL: 0.0000 ug/kg/min | INTRAVENOUS | Status: DC

## 2021-06-05 MED ORDER — VANCOMYCIN HCL IN DEXTROSE 1-5 GM/200ML-% IV SOLN
1000.0000 mg | Freq: Once | INTRAVENOUS | Status: AC
  Administered 2021-06-05: 1000 mg via INTRAVENOUS

## 2021-06-05 MED ORDER — NALOXONE HCL 2 MG/2ML IJ SOSY
0.4000 mg | PREFILLED_SYRINGE | INTRAMUSCULAR | Status: DC | PRN
  Filled 2021-06-05: qty 2

## 2021-06-05 MED ORDER — DOCUSATE SODIUM 50 MG/5ML PO LIQD
100.0000 mg | Freq: Two times a day (BID) | ORAL | Status: DC
  Filled 2021-06-05: qty 10

## 2021-06-05 MED ORDER — SODIUM CHLORIDE 0.9 % IV BOLUS
1000.0000 mL | INTRAVENOUS | Status: AC
  Administered 2021-06-05: 1000 mL via INTRAVENOUS

## 2021-06-05 MED ORDER — NALOXONE HCL 2 MG/2ML IJ SOSY
PREFILLED_SYRINGE | INTRAMUSCULAR | Status: AC
  Administered 2021-06-05: 0.4 mg via INTRAVENOUS
  Filled 2021-06-05: qty 2

## 2021-06-05 MED ORDER — SODIUM CHLORIDE 0.9 % IV SOLN
250.0000 mL | INTRAVENOUS | Status: DC
  Administered 2021-06-05: 250 mL via INTRAVENOUS

## 2021-06-05 MED ORDER — NOREPINEPHRINE 4 MG/250ML-% IV SOLN
2.0000 ug/min | INTRAVENOUS | Status: DC
  Administered 2021-06-05: 2 ug/min via INTRAVENOUS
  Filled 2021-06-05: qty 250

## 2021-06-05 MED ORDER — ROCURONIUM BROMIDE 50 MG/5ML IV SOLN
80.0000 mg | Freq: Once | INTRAVENOUS | Status: AC
  Administered 2021-06-05: 80 mg via INTRAVENOUS
  Filled 2021-06-05: qty 8

## 2021-06-05 MED ORDER — ALBUTEROL SULFATE (2.5 MG/3ML) 0.083% IN NEBU: 2.5000 mg | INHALATION_SOLUTION | RESPIRATORY_TRACT | Status: DC | PRN

## 2021-06-05 MED ORDER — VANCOMYCIN HCL IN DEXTROSE 1-5 GM/200ML-% IV SOLN
1000.0000 mg | Freq: Once | INTRAVENOUS | Status: DC
  Filled 2021-06-05: qty 200

## 2021-06-05 MED ORDER — POLYETHYLENE GLYCOL 3350 17 G PO PACK: 17.0000 g | PACK | Freq: Every day | ORAL | Status: DC | PRN

## 2021-06-05 MED ORDER — CHLORHEXIDINE GLUCONATE CLOTH 2 % EX PADS
6.0000 | MEDICATED_PAD | Freq: Every day | CUTANEOUS | Status: DC
  Administered 2021-06-06: 6 via TOPICAL
  Filled 2021-06-05: qty 6

## 2021-06-05 MED ORDER — FENTANYL CITRATE (PF) 100 MCG/2ML IJ SOLN
100.0000 ug | Freq: Once | INTRAMUSCULAR | Status: AC
  Administered 2021-06-05: 100 ug via INTRAVENOUS

## 2021-06-05 MED ORDER — DOCUSATE SODIUM 100 MG PO CAPS: 100.0000 mg | ORAL_CAPSULE | Freq: Two times a day (BID) | ORAL | Status: DC | PRN

## 2021-06-05 MED ORDER — FENTANYL CITRATE (PF) 100 MCG/2ML IJ SOLN: 50.0000 ug | INTRAMUSCULAR | Status: DC | PRN

## 2021-06-05 MED ORDER — ENOXAPARIN SODIUM 80 MG/0.8ML IJ SOSY
0.5000 mg/kg | PREFILLED_SYRINGE | INTRAMUSCULAR | Status: DC
  Administered 2021-06-05: 80 mg via SUBCUTANEOUS
  Filled 2021-06-05 (×2): qty 0.8

## 2021-06-05 MED ORDER — SODIUM CHLORIDE 0.9 % IV BOLUS
1000.0000 mL | Freq: Once | INTRAVENOUS | Status: AC
  Administered 2021-06-05: 1000 mL via INTRAVENOUS

## 2021-06-05 MED ORDER — POLYETHYLENE GLYCOL 3350 17 G PO PACK: 17.0000 g | PACK | Freq: Every day | ORAL | Status: DC

## 2021-06-05 MED ORDER — NALOXONE HCL 0.4 MG/ML IJ SOLN
INTRAMUSCULAR | Status: AC
  Administered 2021-06-05: 0.4 mg
  Filled 2021-06-05: qty 1

## 2021-06-05 MED ORDER — MIDAZOLAM HCL 2 MG/2ML IJ SOLN: 2.0000 mg | INTRAMUSCULAR | Status: DC | PRN

## 2021-06-05 MED ORDER — INSULIN ASPART 100 UNIT/ML IJ SOLN
0.0000 [IU] | INTRAMUSCULAR | Status: DC
  Administered 2021-06-05: 7 [IU] via SUBCUTANEOUS
  Administered 2021-06-06: 3 [IU] via SUBCUTANEOUS
  Administered 2021-06-06: 7 [IU] via SUBCUTANEOUS
  Administered 2021-06-06: 3 [IU] via SUBCUTANEOUS
  Filled 2021-06-05 (×4): qty 1

## 2021-06-05 MED ORDER — METRONIDAZOLE 500 MG/100ML IV SOLN
500.0000 mg | Freq: Once | INTRAVENOUS | Status: AC
  Administered 2021-06-05: 500 mg via INTRAVENOUS
  Filled 2021-06-05: qty 100

## 2021-06-05 MED ORDER — SODIUM CHLORIDE 0.9 % IV SOLN: INTRAVENOUS | Status: DC

## 2021-06-05 MED ORDER — SODIUM CHLORIDE 0.9 % IV SOLN
2.0000 g | Freq: Once | INTRAVENOUS | Status: AC
  Administered 2021-06-05: 2 g via INTRAVENOUS
  Filled 2021-06-05: qty 2

## 2021-06-05 MED ORDER — NALOXONE HCL 4 MG/10ML IJ SOLN
1.0000 mg/h | INTRAVENOUS | Status: DC
  Administered 2021-06-05: 0.5 mg/h via INTRAVENOUS
  Administered 2021-06-06 (×2): 1 mg/h via INTRAVENOUS
  Filled 2021-06-05 (×4): qty 10

## 2021-06-05 MED ORDER — PANTOPRAZOLE SODIUM 40 MG IV SOLR: 40.0000 mg | Freq: Every day | INTRAVENOUS | Status: DC

## 2021-06-05 MED ORDER — VANCOMYCIN HCL 1500 MG/300ML IV SOLN
1500.0000 mg | Freq: Once | INTRAVENOUS | Status: DC
  Filled 2021-06-05: qty 300

## 2021-06-05 MED ORDER — PANTOPRAZOLE SODIUM 40 MG IV SOLR
40.0000 mg | Freq: Two times a day (BID) | INTRAVENOUS | Status: DC
  Administered 2021-06-05 – 2021-06-07 (×4): 40 mg via INTRAVENOUS
  Filled 2021-06-05 (×4): qty 40

## 2021-06-05 MED ORDER — FENTANYL CITRATE (PF) 100 MCG/2ML IJ SOLN
50.0000 ug | INTRAMUSCULAR | Status: DC | PRN
Start: 2021-06-05 — End: 2021-06-05

## 2021-06-05 MED ORDER — NALOXONE HCL 2 MG/2ML IJ SOSY
0.4000 mg | PREFILLED_SYRINGE | Freq: Once | INTRAMUSCULAR | Status: AC
  Administered 2021-06-05: 0.4 mg via INTRAVENOUS
  Filled 2021-06-05: qty 2

## 2021-06-05 NOTE — ED Triage Notes (Signed)
Pt to ED [facility] from home Being bagged upon arrival Found by family member unresponsive in bed, CBG found to be 541, was breathing 16/min at that time, respirations slowed to 4/min with EMS, OPA inserted and they started bagging pt RT at bedside, EDP at bedside Other VS were normal per EMS

## 2021-06-05 NOTE — ED Notes (Signed)
Narcan given, pt woke up within a few seconds and following commands. Critical care NP at bedside. Plan is to obtain ABG, start narcan drip and extubate.

## 2021-06-05 NOTE — Consult Note (Signed)
PHARMACY -  BRIEF ANTIBIOTIC NOTE   Pharmacy has received consult(s) for vancomycin and cefepime from an ED provider.  The patient's profile has been reviewed for ht/wt/allergies/indication/available labs.    One time order(s) placed for cefepime 2 g and vancomycin 2500 mg (ordered as 1000 mg followed by 1500 mg)  Further antibiotics/pharmacy consults should be ordered by admitting physician if indicated.                       Thank you, Darnelle Bos 06/05/2021  6:31 PM

## 2021-06-05 NOTE — Progress Notes (Signed)
eLink Physician-Brief Progress Note Patient Name: Lindsey [name] DOB: [birth year] MRN: [id number]   Date of Service  06/05/2021  HPI/Events of Note  47F with T2DM, COPD, CAD s/p MI, and is on chronic opioid therapy as outpatient. Presented with respiratory failure secondary to opioid overdose. Was initially intubated but extubated a short while later after her mentation normalized following Narcan administration. She is on a Narcan drip at this time.   Current hypothesis is that the opioid overdose was accidental in the setting of reduced opioid clearance due to recent worsening of renal function. Patient strongly denied SI/SA or depression.  As I camera into the room the RN is giving Narcan 0.4mg  IV push for increased somnolence, and increasing Narcan drip rate from 0.5 to 1.0.  With these interventions, the patient is now much more awake and conversant.  She is saturating high 90s on room air.   eICU Interventions  # Neuro: - Narcan drip at rate of 1.0 - Keep off supplemental oxygen which tends to mask periods of apnea. Decreased SpO2 then becomes an early-warning sign of hypoventilation.  Remainder of plan as per APP note.     Intervention Category Evaluation Type: New Patient Evaluation  Charlott Rakes 06/05/2021, 9:32 PM

## 2021-06-05 NOTE — H&P (Addendum)
NAME:  Lindsey [name], MRN:  [id number], DOB:  10/08/1961, LOS: 0 ADMISSION DATE:  06/05/2021, CONSULTATION DATE:  06/05/21 REFERRING MD:  Dr. Joni Fears, CHIEF COMPLAINT:  unresponsive, respiratory failure  History of Present Illness:  60 yo F presented to [facility] ED from home via EMS unresponsive with a pulse being actively bagged. Grandson who lives with the patient reported seeing her well 45 minutes before finding her unresponsive in her bedroom. He reports this has happened before, when she was wearing opioid pain patches. He also reported finding a bottle of oxycodone next to the bed, but is unsure how much she might have taken. ED course: Patient intubated emergently in ED requiring mechanical ventilation and vasopressor support.  Initial Vitals: T- 97.5, RR 4 (prior to intubation), ST 108, BP- 91/66 (prior to intubation) Significant labs: mildly hyponatremic at 945, non-AG metabolic acidosis with Serum CO2 at 19 and AG at 7, CBG 333, osmolality elevated at 312, mild transaminitis: AST-69, ALT-50, leukocytosis- 17, lactic acidosis- 2.8, COVID-19 negative, bet-hydroxy: 0.12, UA negative, alcohol level < 10, tox screen: +benzo, +opiates, +tricyclics. CTH: negative for acute intracranial abnormality, CXR: showed cardiomegaly and mild interstitial edema however difficult to fully evaluate LLL  PCCM consulted to admit.  Addendum: Upon extubation patient strongly denied taking any medications that were not prescribed to her. She reported taking 10 mg of oxycodone every 4 hours throughout the day and around 5 pm taking that Q 4 dose of 10 mg of oxycodone in conjunction with 2 tablets of Naprosyn. We discussed that her outpatient PCP has been discussing with her controlling her T2DM with insulin and seeing a kidney doctor regarding her worsening kidney function. She denies any feelings of anxiety/sadness/hopelessness over these last 2 weeks. She denies any feelings of wanting to die or any concrete plans  to take her own life. She reports she is very careful with her medication and always takes it as prescribed. From this interview I suspect as her kidney function has been worsening as her diabetes has been uncontrolled and she has been clearing her opiates less effectively. We will maintain the narcan drip overnight and monitor her in the ICU. Pertinent  Medical History  COPD - has O2 at home but does not wear per grandson report T2DM CAD with MI (2002) HLD HTN PUD OSA CVA (2004) Vertigo  Significant Hospital Events: Including procedures, antibiotic start and stop dates in addition to other pertinent events   06/05/21- Admit to ICU in Acute hypoxic respiratory failure, emergently intubated in ED requiring mechanical ventilation and vasopressor support. Narcan given, patient awake and responsive > will attempt to extubate  Interim History / Subjective:  Patient unresponsive on mechanical ventilation. After grandson's story narcan administered and patient woke up, drowsy but responsive- shaking head yes/no to questions and following commands. Repeat ABG and narcan drip ordered. Will attempt to extubate depending on results.  Labs/ Imaging personally reviewed I, Domingo Pulse [place], AGACNP-BC, personally viewed and interpreted this ECG. EKG Interpretation Date: 06/05/21 EKG Time: 17:33 Rate: 85 Rhythm: NSR QRS Axis:  normal Intervals: prolonged QTc ST/T Wave abnormalities: none Narrative Interpretation: NSR  Na+/ K+: 134/ 4.9 BUN/Cr.: 36/ 2.42 Serum CO2/ AG: 19/ 7  Hgb: 12.4 BNP: pending  WBC/ TMAX: 17/ 97.5 Lactic: 2.8  VBG: 7.11/ 61/ 55/ 19.4 CXR 06/05/21: showed cardiomegaly and mild interstitial edema however difficult to fully evaluate LLL [facility] 06/05/21: negative for acute intracranial abnormality  Objective   Blood pressure 91/63, pulse 79, temperature (!) 96.2 F (  35.7 C), resp. rate (!) 23, height 5\' 7"  (1.702 m), weight (!) 158.8 kg, SpO2 100 %.    Vent Mode:  AC FiO2 (%):  [40 %] 40 % Set Rate:  [16 bmp] 16 bmp Vt Set:  [500 mL] 500 mL PEEP:  [5 cmH20] 5 cmH20   Intake/Output Summary (Last 24 hours) at 06/05/2021 1920 Last data filed at 06/05/2021 1914 Gross per 24 hour  Intake 1100 ml  Output --  Net 1100 ml   Filed Weights   06/05/21 1735  Weight: (!) 158.8 kg    Examination: General: Adult female, critically ill, lying in bed intubated requiring mechanical ventilation, NAD HEENT: MM pink/moist, anicteric, atraumatic, neck supple Neuro: drowsy and responsive (post narcan) able to follow commands, PERRL +2, MAE CV: s1s2 RRR, NSR on monitor, no r/m/g Pulm: Regular, non labored on AC at 40%, PEEP 5, breath sounds clear-BUL & diminished-BLL GI: soft, rounded, non tender, bs x 4 GU: foley in place with clear yellow urine Skin:  no rashes/lesions noted Extremities: warm/dry, pulses + 2 R/P, no edema noted  Resolved Hospital Problem list     Assessment & Plan:  Acute Hypoxic / Hypercapnic Respiratory Failure secondary to of Suspected Unintentional Opioid Overdose   PMHx: chronic opioid use, COPD, OSA (not on chronic O2) - Patient given Narcan 0.4 mg x 1 and woke up drowsy-alert and responsive - will start Narcan drip as we are unsure how much opiates the patient consumed in conjunction with AKI and obesity > patient has a history of pain patches, skin checked and no patch in place. - recheck ABG, if normalized we will extubate - Ventilator settings: PRVC  8 mL/kg, 40 % FiO2, 5 PEEP, continue ventilator support & lung protective strategies - Supplemental O2 PRN to maintain SpO2 > 90% - Ensure adequate pulmonary hygiene  - F/u cultures, trend lactic - will hold off on further ABX at this time - bronchodilators PRN - PAD protocol discontinued  Lactic Acidosis  Leukocytosis Lactic: 2.8 > 2.0, PCT pending Patient denies any s/s of infection, she is prescribed metformin outpatient- unclear if she's been taking this regularly. She  received 1 L of NS and empiric abx: cefepime, flagyl & vancomycin. Will hold off on further antibiotics at this time - BNP normal so will give additional liter of NS slowly - trend lactic/ PCT - daily CBC, monitor WBC & fever curve  Acute Kidney Injury on suspected CKD in the setting of hypoperfusion Non-AG Metabolic Acidosis Baseline Cr: unclear (patient reports PCP concerned about worsening function-most recent Cr in EMR fr 04/2020: 1.70, Cr on admission: 2.42, UA + for protein Patient does admit to taking Naprosyn outpatient BID - Strict I/O's: alert provider if UOP < 0.5 mL/kg/hr - gentle IVF hydration  - Daily BMP, replace electrolytes PRN - Avoid nephrotoxic agents as able, ensure adequate renal perfusion > patient counseled to avoid NSAIDs - Consider nephrology consult  - consider renal US  Transaminitis in the setting of hypoperfusion - Trend hepatic function - Consider RUQ [place] - if not normalized by AM - avoid hepatotoxic agents - hold simvastatin for now  Poorly controlled Type 2 Diabetes Mellitus Hemoglobin A1C: pending Patient reports being prescribed insulin by PCP but not wanting to take it because of her mother's experience with insulin. Counseled the patient on the importance of controlling her glucose and how it impacts her kidney function as well as her overall health - Monitor CBG Q 4 hours - SSI resistant dosing -  target range while in ICU: 140-180 - follow ICU hyper/hypo-glycemia protocol - consult Diabetes Coordinator  Best Practice (right click and "Reselect all SmartList Selections" daily)  Diet/type: NPO DVT prophylaxis: LMWH GI prophylaxis: PPI Lines: N/A Foley:  Yes, and it is still needed Code Status:  full code Last date of multidisciplinary goals of care discussion 06/05/21  Labs   CBC: Recent Labs  Lab 06/05/21 1742  WBC 17.0*  NEUTROABS 8.1*  HGB 12.4  HCT 40.1  MCV 92.4  PLT 431    Basic Metabolic Panel: Recent Labs  Lab  06/05/21 1742  NA 134*  K 4.9  CL 108  CO2 19*  GLUCOSE 333*  BUN 36*  CREATININE 2.42*  CALCIUM 8.1*   GFR: Estimated Creatinine Clearance: 38.2 mL/min (A) (by C-G formula based on SCr of 2.42 mg/dL (H)). Recent Labs  Lab 06/05/21 1742 06/05/21 1749  WBC 17.0*  --   LATICACIDVEN  --  2.8*    Liver Function Tests: Recent Labs  Lab 06/05/21 1742  AST 69*  ALT 50*  ALKPHOS 106  BILITOT 1.0  PROT 6.4*  ALBUMIN 3.2*   No results for input(s): LIPASE, AMYLASE in the last 168 hours. No results for input(s): AMMONIA in the last 168 hours.  ABG    Component Value Date/Time   HCO3 19.4 (L) 06/05/2021 1748   ACIDBASEDEF 10.5 (H) 06/05/2021 1748   O2SAT 75.1 06/05/2021 1748     Coagulation Profile: No results for input(s): INR, PROTIME in the last 168 hours.  Cardiac Enzymes: No results for input(s): CKTOTAL, CKMB, CKMBINDEX, TROPONINI in the last 168 hours.  HbA1C: Hgb A1c MFr Bld  Date/Time Value Ref Range Status  05/07/2020 12:24 AM 9.5 (H) 4.8 - 5.6 % Final    Comment:    (NOTE) Pre diabetes:          5.7%-6.4%  Diabetes:              >6.4%  Glycemic control for   <7.0% adults with diabetes   06/08/2017 05:38 PM 6.8 (H) 4.8 - 5.6 % Final    Comment:    (NOTE)         Pre-diabetes: 5.7 - 6.4         Diabetes: >6.4         Glycemic control for adults with diabetes: <7.0     CBG: Recent Labs  Lab 06/05/21 1733  GLUCAP 373*    Review of Systems: Positives in BOLD (interview completed post extubation)  Gen: Denies fever, chills, weight change, fatigue, night sweats HEENT: Denies blurred vision, double vision, hearing loss, tinnitus, sinus congestion, rhinorrhea, sore throat, neck stiffness, dysphagia PULM: Denies shortness of breath, cough, sputum production, hemoptysis, wheezing CV: Denies chest pain, edema, orthopnea, paroxysmal nocturnal dyspnea, palpitations GI: Denies abdominal pain, nausea, vomiting, diarrhea, hematochezia, melena,  constipation, change in bowel habits GU: Denies dysuria, hematuria, polyuria, oliguria, urethral discharge Endocrine: Denies hot or cold intolerance, polyuria, polyphagia or appetite change Derm: Denies rash, dry skin, scaling or peeling skin change Heme: Denies easy bruising, bleeding, bleeding gums Neuro: Denies headache, numbness, weakness, slurred speech, loss of memory or consciousness  Past Medical History:  She,  has a past medical history of Allergy, Anemia, Arthritis, COPD (chronic obstructive pulmonary disease) ([place]), Diabetes mellitus without complication ([place]), GERD (gastroesophageal reflux disease), Heart attack ([place]) (2002), Hyperlipidemia, Hypertension, PUD (peptic ulcer disease), Sleep apnea, Spinal cord stimulator status, Stroke ([place]) (2004), Vertigo, and Wears dentures.   Surgical History:  Past Surgical History:  Procedure Laterality Date   ABDOMINAL HYSTERECTOMY     back surgery     broken wrist     COLONOSCOPY WITH PROPOFOL N/A 01/30/2017   Procedure: COLONOSCOPY WITH PROPOFOL;  Surgeon: Jonathon Bellows, MD;  Location: [facility] ENDOSCOPY;  Service: Endoscopy;  Laterality: N/A;   COLONOSCOPY WITH PROPOFOL N/A 02/20/2017   Procedure: Colonoscopy with propofol ;  Surgeon: Jonathon Bellows, MD;  Location[facility] ENDOSCOPY;  Service: Endoscopy;  Laterality: N/A;   COLONOSCOPY WITH PROPOFOL N/A 03/13/2017   Procedure: COLONOSCOPY WITH PROPOFOL;  Surgeon: Jonathon Bellows, MD;  Location: [facility] ENDOSCOPY;  Service: Endoscopy;  Laterality: N/A;   COLONOSCOPY WITH PROPOFOL N/A 04/17/2017   Procedure: COLONOSCOPY WITH PROPOFOL;  Surgeon: Jonathon Bellows, MD;  Location: [facility] ENDOSCOPY;  Service: Endoscopy;  Laterality: N/A;   ESOPHAGOGASTRODUODENOSCOPY N/A 05/05/2020   Procedure: ESOPHAGOGASTRODUODENOSCOPY (EGD);  Surgeon: Toledo, Benay Pike, MD;  Location: [facility] ENDOSCOPY;  Service: Gastroenterology;  Laterality: N/A;   ESOPHAGOGASTRODUODENOSCOPY (EGD) WITH PROPOFOL N/A 09/28/2016   Procedure:  ESOPHAGOGASTRODUODENOSCOPY (EGD) WITH PROPOFOL;  Surgeon: Manya Silvas, MD;  Location: [facility] ENDOSCOPY;  Service: Endoscopy;  Laterality: N/A;   ESOPHAGOGASTRODUODENOSCOPY (EGD) WITH PROPOFOL N/A 03/13/2017   Procedure: ESOPHAGOGASTRODUODENOSCOPY (EGD) WITH PROPOFOL;  Surgeon: Jonathon Bellows, MD;  Location: [facility] ENDOSCOPY;  Service: Endoscopy;  Laterality: N/A;   GALLBLADDER SURGERY     GASTRIC BYPASS     GIVENS CAPSULE STUDY N/A 03/13/2017   Procedure: GIVENS CAPSULE STUDY;  Surgeon: Jonathon Bellows, MD;  Location: [facility] ENDOSCOPY;  Service: Endoscopy;  Laterality: N/A;   HERNIA REPAIR     JOINT REPLACEMENT  1995   REPLACEMENT TOTAL KNEE     ROTATOR CUFF REPAIR       Social History:   reports that she has never smoked. She has never used smokeless tobacco. She reports that she does not drink alcohol and does not use drugs.   Family History:  Her family history includes COPD in her father; Heart disease in her father; Hypertension in her father. There is no history of Breast cancer.   Allergies Allergies  Allergen Reactions   Morphine Hives   Iodinated Diagnostic Agents Hives   Aspirin Hives    Noted on MD progress notes and discussed with MD 09/02/15   Etodolac Hives   Ibuprofen Hives   Shellfish Allergy Hives   Tylenol [Acetaminophen] Hives    Upset stomach      Home Medications  Prior to Admission medications   Medication Sig Start Date End Date Taking? Authorizing Provider  albuterol (PROVENTIL HFA;VENTOLIN HFA) 108 (90 BASE) MCG/ACT inhaler Inhale 2 puffs into the lungs every 4 (four) hours as needed for wheezing or shortness of breath.    [provider]  albuterol (PROVENTIL) (2.5 MG/3ML) 0.083% nebulizer solution Take 2.5 mg by nebulization every 6 (six) hours as needed for wheezing or shortness of breath.    [provider]  ALPRAZolam Duanne Moron) 0.25 MG tablet Take 0.25 mg by mouth 3 (three) times daily as needed for anxiety.     [provider]   baclofen (LIORESAL) 10 MG tablet Take 10 mg by mouth 3 (three) times daily.    [provider]  busPIRone (BUSPAR) 7.5 MG tablet Take 7.5 mg by mouth 2 (two) times daily.    [provider]  Calcium Carbonate-Vitamin D (OYSTER SHELL CALCIUM 500 + D PO)  12/26/16   [provider]  Calcium Lactate 648 MG TABS Take 648 mg by mouth 2 (two) times  daily.    [provider]  dapagliflozin propanediol (FARXIGA) 10 MG TABS tablet Take 10 mg by mouth daily.    [provider]  desvenlafaxine (PRISTIQ) 100 MG 24 hr tablet Take 100 mg by mouth daily. Pt takes 100 mg tab and 50 mg tab for total of 150 mg    [provider]  desvenlafaxine (PRISTIQ) 50 MG 24 hr tablet Take 50 mg by mouth daily.     [provider]  esomeprazole (NEXIUM) 40 MG capsule Take 40 mg by mouth 2 (two) times daily.    [provider]  fentaNYL (DURAGESIC - DOSED MCG/HR) 12 MCG/HR Place 12 mcg onto the skin every 3 (three) days.    [provider]  fentaNYL (DURAGESIC - DOSED MCG/HR) 25 MCG/HR patch Place 25 mcg onto the skin every 3 (three) days.    [provider]  ferrous sulfate 325 (65 FE) MG tablet Take 1 tablet (325 mg total) by mouth 2 (two) times daily with a meal. 12/26/16   Sudini, Srikar, MD  fexofenadine (ALLEGRA) 180 MG tablet Take 180 mg by mouth daily.  01/25/17   [provider]  fluticasone (FLONASE) 50 MCG/ACT nasal spray Place 1 spray into both nostrils 2 (two) times daily.    [provider]  folic acid (FOLVITE) 1 MG tablet Take 1 mg by mouth daily.    [provider]  hydrOXYzine (ATARAX/VISTARIL) 25 MG tablet Take 25 mg by mouth 3 (three) times daily.    [provider]  meclizine (ANTIVERT) 25 MG tablet Take 1 tablet (25 mg total) by mouth 3 (three) times daily as needed for dizziness or nausea. 01/23/17   Earleen Newport, MD  metFORMIN (GLUCOPHAGE-XR) 500 MG 24 hr tablet Take 1,000 mg by  mouth daily with breakfast.    [provider]  metoprolol succinate (TOPROL-XL) 100 MG 24 hr tablet Take 100 mg by mouth daily. Take with or immediately following a meal.    [provider]  mirtazapine (REMERON) 45 MG tablet Take 45 mg by mouth at bedtime.    [provider]  nitroGLYCERIN (NITROSTAT) 0.3 MG SL tablet Place under the tongue.    [provider]  olmesartan-hydrochlorothiazide (BENICAR HCT) 20-12.5 MG tablet Take 1 tablet by mouth daily.    [provider]  ONE TOUCH ULTRA TEST test strip  01/25/17   [provider]  ONGLYZA 5 MG TABS tablet Take 1 tablet by mouth daily. 08/28/16   [provider]  oxybutynin (DITROPAN) 5 MG tablet Take 5 mg by mouth 2 (two) times daily.    [provider]  oxyCODONE (OXY IR/ROXICODONE) 5 MG immediate release tablet Take 5 mg by mouth every 6 (six) hours as needed for severe pain.    [provider]  phenazopyridine (PYRIDIUM) 100 MG tablet Take 100 mg by mouth 3 (three) times daily as needed for pain.    [provider]  potassium chloride (K-DUR) 10 MEQ tablet Take 10 mEq by mouth daily.    [provider]  Semaglutide (RYBELSUS) 14 MG TABS Take 1 tablet by mouth in the morning.    [provider]  simvastatin (ZOCOR) 40 MG tablet Take 1 tablet by mouth daily. 09/24/16   [provider]  sucralfate (CARAFATE) 1 g tablet Take 1 g by mouth 4 (four) times daily.     [provider]  Suvorexant (BELSOMRA) 15 MG TABS Take 1 tablet by mouth at bedtime.  [provider]  tamsulosin (FLOMAX) 0.4 MG CAPS capsule Take 0.4 mg by mouth daily. 04/09/17   [provider]  zolpidem (AMBIEN) 10 MG tablet Take 10 mg by mouth at bedtime as needed for sleep.    [provider]     Critical care time: 60 minutes       Venetia Night, AGACNP-BC Acute Care Nurse Practitioner [facility]   [phone] / [phone] Please see Amion for pager details.

## 2021-06-05 NOTE — ED Notes (Signed)
Attempted NG placement with Dr Joni Fears. Unsuccessful. Will inform next RN.

## 2021-06-05 NOTE — ED Notes (Signed)
X-ray at bedside

## 2021-06-05 NOTE — ED Notes (Signed)
Respiratory at bedside drawing ABG

## 2021-06-05 NOTE — Progress Notes (Signed)
PHARMACIST - PHYSICIAN COMMUNICATION  CONCERNING:  Enoxaparin (Lovenox) for DVT Prophylaxis   DESCRIPTION: Patient was prescribed enoxaprin 40mg  q24 hours for VTE prophylaxis.   Filed Weights   06/05/21 1735  Weight: (!) 158.8 kg (350 lb)    Body mass index is 54.82 kg/m.  Estimated Creatinine Clearance: 38.2 mL/min (A) (by C-G formula based on SCr of 2.42 mg/dL (H)).   Based on [place] patient is candidate for enoxaparin 0.5mg /kg TBW SQ every 24 hours based on BMI being >30.  RECOMMENDATION: Pharmacy has adjusted enoxaparin dose per [facility] policy.  Patient is now receiving enoxaparin 80 mg every 24 hours    Darnelle Bos, PharmD Clinical Pharmacist  06/05/2021 7:45 PM

## 2021-06-05 NOTE — ED Notes (Signed)
Pt extubated to 2L  by RT. Pt alert and able to speak in full complete sentences

## 2021-06-05 NOTE — ED Provider Notes (Signed)
[facility] Emergency Department Provider Note  ____________________________________________  Time seen: Approximately 7:54 PM  I have reviewed the triage vital signs and the nursing notes.   HISTORY  Chief Complaint Respiratory Arrest (Being bagged EMS)    Level 5 Caveat: Portions of the History and Physical including HPI and review of systems are unable to be completely obtained due to patient altered mental status  HPI Lindsey [name] is a 60 y.o. female with a history of COPD diabetes hypertension and previous stroke who is brought to the ED by ambulance due to altered mental status.  Family found the patient in her bed, unresponsive.  EMS reports a blood sugar of about 500, and during transport respiratory rate declined from 16 a minute to about 4/min requiring them to provide assisted ventilations with Ambu bag.  PDMP shows patient is prescribed opiates and benzodiazepines.  No recent illness, no trauma history.  Patient does have a seizure history.  Family believes patient has been compliant with medications.    Past Medical History:  Diagnosis Date   Allergy    Anemia    Arthritis    knees   COPD (chronic obstructive pulmonary disease) ([place])    Diabetes mellitus without complication (HCC)    diet controlled   GERD (gastroesophageal reflux disease)    Heart attack ([place]) 2002   Hyperlipidemia    Hypertension    PUD (peptic ulcer disease)    Sleep apnea    can't tolerate CPAP.  Uses O2 only   Spinal cord stimulator status    for lower back pain   Stroke [facility]) 2004   "mini-stroke"   Vertigo    Wears dentures    full upper and lower     Patient Active Problem List   Diagnosis Date Noted   Chronic obstructive pulmonary disease, unspecified COPD type ([place]) 06/05/2021   Acute respiratory failure ([place]) 06/05/2021   Acute lower UTI 05/08/2020   Upper GI bleed 05/04/2020   AKI (acute kidney injury) ([place]) 06/09/2017   Symptomatic anemia  12/24/2016   Chest pain 12/23/2016   Dyspnea 12/23/2016   Palpitations 12/23/2016   Hypotension 09/29/2016   Renal insufficiency 09/29/2016   Anemia 09/29/2016   Leukocytosis 09/29/2016   Prediabetes 09/29/2016   Jejunal inflammation 09/29/2016   Abnormal findings on esophagogastroduodenoscopy (EGD) 09/29/2016   Vitamin D deficiency 09/29/2016   Syncope 09/27/2016   Medication overuse headache 11/08/2015   Morbid obesity with BMI of 45.0-49.9, adult ([place]) 11/08/2015   Seizures ([place]) 09/02/2015   Lipodystrophy 35/57/3220   Complications due to nervous system device, implant, and graft 04/18/2013   Disease of female genital organs 04/01/2013   Difficult or painful urination 03/18/2013   Urge incontinence 03/18/2013   Urgency of micturation 03/18/2013     Past Surgical History:  Procedure Laterality Date   ABDOMINAL HYSTERECTOMY     back surgery     broken wrist     COLONOSCOPY WITH PROPOFOL N/A 01/30/2017   Procedure: COLONOSCOPY WITH PROPOFOL;  Surgeon: Jonathon Bellows, MD;  Location: [facility] ENDOSCOPY;  Service: Endoscopy;  Laterality: N/A;   COLONOSCOPY WITH PROPOFOL N/A 02/20/2017   Procedure: Colonoscopy with propofol ;  Surgeon: Jonathon Bellows, MD;  Location[facility] ENDOSCOPY;  Service: Endoscopy;  Laterality: N/A;   COLONOSCOPY WITH PROPOFOL N/A 03/13/2017   Procedure: COLONOSCOPY WITH PROPOFOL;  Surgeon: Jonathon Bellows, MD;  Location: [facility] ENDOSCOPY;  Service: Endoscopy;  Laterality: N/A;   COLONOSCOPY WITH PROPOFOL N/A 04/17/2017   Procedure: COLONOSCOPY WITH  PROPOFOL;  Surgeon: Jonathon Bellows, MD;  Location: [facility] ENDOSCOPY;  Service: Endoscopy;  Laterality: N/A;   ESOPHAGOGASTRODUODENOSCOPY N/A 05/05/2020   Procedure: ESOPHAGOGASTRODUODENOSCOPY (EGD);  Surgeon: Toledo, Benay Pike, MD;  Location: [facility] ENDOSCOPY;  Service: Gastroenterology;  Laterality: N/A;   ESOPHAGOGASTRODUODENOSCOPY (EGD) WITH PROPOFOL N/A 09/28/2016   Procedure: ESOPHAGOGASTRODUODENOSCOPY (EGD) WITH PROPOFOL;  Surgeon: Manya Silvas, MD;  Location: [facility] ENDOSCOPY;  Service: Endoscopy;  Laterality: N/A;   ESOPHAGOGASTRODUODENOSCOPY (EGD) WITH PROPOFOL N/A 03/13/2017   Procedure: ESOPHAGOGASTRODUODENOSCOPY (EGD) WITH PROPOFOL;  Surgeon: Jonathon Bellows, MD;  Location: [facility] ENDOSCOPY;  Service: Endoscopy;  Laterality: N/A;   GALLBLADDER SURGERY     GASTRIC BYPASS     GIVENS CAPSULE STUDY N/A 03/13/2017   Procedure: GIVENS CAPSULE STUDY;  Surgeon: Jonathon Bellows, MD;  Location: [facility] ENDOSCOPY;  Service: Endoscopy;  Laterality: N/A;   HERNIA REPAIR     JOINT REPLACEMENT  1995   REPLACEMENT TOTAL KNEE     ROTATOR CUFF REPAIR       Prior to Admission medications   Medication Sig Start Date End Date Taking? Authorizing Provider  albuterol (PROVENTIL HFA;VENTOLIN HFA) 108 (90 BASE) MCG/ACT inhaler Inhale 2 puffs into the lungs every 4 (four) hours as needed for wheezing or shortness of breath.    [provider]  albuterol (PROVENTIL) (2.5 MG/3ML) 0.083% nebulizer solution Take 2.5 mg by nebulization every 6 (six) hours as needed for wheezing or shortness of breath.    [provider]  ALPRAZolam Duanne Moron) 0.25 MG tablet Take 0.25 mg by mouth 3 (three) times daily as needed for anxiety.     [provider]  baclofen (LIORESAL) 10 MG tablet Take 10 mg by mouth 3 (three) times daily.    [provider]  busPIRone (BUSPAR) 7.5 MG tablet Take 7.5 mg by mouth 2 (two) times daily.    [provider]  Calcium Carbonate-Vitamin D (OYSTER SHELL CALCIUM 500 + D PO)  12/26/16   [provider]  Calcium Lactate 648 MG TABS Take 648 mg by mouth 2 (two) times daily.    [provider]  dapagliflozin propanediol (FARXIGA) 10 MG TABS tablet Take 10 mg by mouth daily.    [provider]  desvenlafaxine (PRISTIQ) 100 MG 24 hr tablet Take 100 mg by mouth daily. Pt takes 100 mg tab and 50 mg tab for total of 150 mg    [provider]  desvenlafaxine (PRISTIQ) 50 MG 24 hr  tablet Take 50 mg by mouth daily.     [provider]  esomeprazole (NEXIUM) 40 MG capsule Take 40 mg by mouth 2 (two) times daily.    [provider]  fentaNYL (DURAGESIC - DOSED MCG/HR) 12 MCG/HR Place 12 mcg onto the skin every 3 (three) days.    [provider]  fentaNYL (DURAGESIC - DOSED MCG/HR) 25 MCG/HR patch Place 25 mcg onto the skin every 3 (three) days.    [provider]  ferrous sulfate 325 (65 FE) MG tablet Take 1 tablet (325 mg total) by mouth 2 (two) times daily with a meal. 12/26/16   Sudini, Srikar, MD  fexofenadine (ALLEGRA) 180 MG tablet Take 180 mg by mouth daily.  01/25/17   [provider]  fluticasone (FLONASE) 50 MCG/ACT nasal spray Place 1 spray into both nostrils 2 (two) times daily.    [provider]  folic acid (FOLVITE) 1 MG tablet Take 1 mg by mouth daily.    [provider]  hydrOXYzine (ATARAX/VISTARIL)  25 MG tablet Take 25 mg by mouth 3 (three) times daily.    [provider]  meclizine (ANTIVERT) 25 MG tablet Take 1 tablet (25 mg total) by mouth 3 (three) times daily as needed for dizziness or nausea. 01/23/17   Earleen Newport, MD  metFORMIN (GLUCOPHAGE-XR) 500 MG 24 hr tablet Take 1,000 mg by mouth daily with breakfast.    [provider]  metoprolol succinate (TOPROL-XL) 100 MG 24 hr tablet Take 100 mg by mouth daily. Take with or immediately following a meal.    [provider]  mirtazapine (REMERON) 45 MG tablet Take 45 mg by mouth at bedtime.    [provider]  nitroGLYCERIN (NITROSTAT) 0.3 MG SL tablet Place under the tongue.    [provider]  olmesartan-hydrochlorothiazide (BENICAR HCT) 20-12.5 MG tablet Take 1 tablet by mouth daily.    [provider]  ONE TOUCH ULTRA TEST test strip  01/25/17   [provider]  ONGLYZA 5 MG TABS tablet Take 1 tablet by mouth daily. 08/28/16   [provider]  oxybutynin (DITROPAN) 5 MG  tablet Take 5 mg by mouth 2 (two) times daily.    [provider]  oxyCODONE (OXY IR/ROXICODONE) 5 MG immediate release tablet Take 5 mg by mouth every 6 (six) hours as needed for severe pain.    [provider]  phenazopyridine (PYRIDIUM) 100 MG tablet Take 100 mg by mouth 3 (three) times daily as needed for pain.    [provider]  potassium chloride (K-DUR) 10 MEQ tablet Take 10 mEq by mouth daily.    [provider]  Semaglutide (RYBELSUS) 14 MG TABS Take 1 tablet by mouth in the morning.    [provider]  simvastatin (ZOCOR) 40 MG tablet Take 1 tablet by mouth daily. 09/24/16   [provider]  sucralfate (CARAFATE) 1 g tablet Take 1 g by mouth 4 (four) times daily.     [provider]  Suvorexant (BELSOMRA) 15 MG TABS Take 1 tablet by mouth at bedtime.    [provider]  tamsulosin (FLOMAX) 0.4 MG CAPS capsule Take 0.4 mg by mouth daily. 04/09/17   [provider]  zolpidem (AMBIEN) 10 MG tablet Take 10 mg by mouth at bedtime as needed for sleep.    [provider]     Allergies Morphine, Iodinated diagnostic agents, Aspirin, Etodolac, Ibuprofen, Shellfish allergy, and Tylenol [acetaminophen]   Family History  Problem Relation Age of Onset   Hypertension Father    COPD Father    Heart disease Father    Breast cancer Neg Hx     Social History Social History   Tobacco Use   Smoking status: Never   Smokeless tobacco: Never  Vaping Use   Vaping Use: Never used  Substance Use Topics   Alcohol use: No    Alcohol/week: 0.0 standard drinks   Drug use: No    Review of Systems Level 5 Caveat: Portions of the History and Physical including HPI and review of systems are unable to be completely obtained due to patient being a poor historian   Constitutional:   No known fever.  ENT:   No rhinorrhea. Cardiovascular:   No chest pain or syncope. Respiratory:   No dyspnea or  cough. Gastrointestinal:   Negative for abdominal pain, vomiting and diarrhea.  Musculoskeletal:   Negative for focal pain or swelling ____________________________________________   PHYSICAL EXAM:  VITAL SIGNS: ED Triage Vitals  Enc  Vitals Group     BP 06/05/21 1734 118/82     Pulse Rate 06/05/21 1734 93     Resp 06/05/21 1734 18     Temp 06/05/21 1800 (!) 97.5 F (36.4 C)     Temp Source 06/05/21 1803 Core     SpO2 06/05/21 1733 100 %     Weight 06/05/21 1735 (!) 350 lb (158.8 kg)     Height 06/05/21 1735 5\' 7"  (1.702 m)     Head Circumference --      Peak Flow --      Pain Score --      Pain Loc --      Pain Edu? --      Excl. in [place]? --     Vital signs reviewed, nursing assessments reviewed.   Constitutional:   Comatose, GCS 3. Eyes:   Conjunctivae are normal. EOMI. PERRL at 4 mm bilaterally. ENT      Head:   Normocephalic and atraumatic.      Nose:   No congestion/rhinnorhea.       Mouth/Throat:   MMM, no pharyngeal erythema. No peritonsillar mass.  Not protecting airway      Neck:   No meningismus. Full ROM. Hematological/Lymphatic/Immunilogical:   No cervical lymphadenopathy. Cardiovascular:   Tachycardia heart rate 110. Symmetric bilateral radial and DP pulses.  No murmurs. Cap refill less than 2 seconds. Respiratory:   Hypopnea with respiratory rate of 4.  Bags easily with Ambu bag.  Breath sounds clear bilaterally.. Gastrointestinal:   Soft and nontender. Non distended. There is no CVA tenderness.  No rebound, rigidity, or guarding. Genitourinary:   deferred Musculoskeletal:   Normal range of motion in all extremities. No joint effusions.  No lower extremity tenderness.  No edema. Neurologic:   GCS 3 Skin:    Skin is warm, dry and intact. No rash noted.  No petechiae, purpura, or bullae.  ____________________________________________    LABS (pertinent positives/negatives) (all labs ordered are listed, but only abnormal results are displayed) Labs Reviewed   BLOOD GAS, VENOUS - Abnormal; Notable for the following components:      Result Value   pH, Ven 7.11 (*)    pCO2, Ven 61 (*)    pO2, Ven 55.0 (*)    Bicarbonate 19.4 (*)    Acid-base deficit 10.5 (*)    All other components within normal limits  CBC WITH DIFFERENTIAL/PLATELET - Abnormal; Notable for the following components:   WBC 17.0 (*)    Neutro Abs 8.1 (*)    Lymphs Abs 7.9 (*)    Abs Immature Granulocytes 0.11 (*)    All other components within normal limits  URINE DRUG SCREEN, QUALITATIVE ([facility] ONLY) - Abnormal; Notable for the following components:   Tricyclic, Ur Screen POSITIVE (*)    Opiate, Ur Screen POSITIVE (*)    Benzodiazepine, Ur Scrn POSITIVE (*)    All other components within normal limits  LACTIC ACID, PLASMA - Abnormal; Notable for the following components:   Lactic Acid, Venous 2.8 (*)    All other components within normal limits  OSMOLALITY - Abnormal; Notable for the following components:   Osmolality 312 (*)    All other components within normal limits  COMPREHENSIVE METABOLIC PANEL - Abnormal; Notable for the following components:   Sodium 134 (*)    CO2 19 (*)    Glucose, Bld 333 (*)    BUN 36 (*)    Creatinine, Ser 2.42 (*)    Calcium 8.1 (*)  Total Protein 6.4 (*)    Albumin 3.2 (*)    AST 69 (*)    ALT 50 (*)    GFR, Estimated 22 (*)    All other components within normal limits  URINALYSIS, COMPLETE (UACMP) WITH MICROSCOPIC - Abnormal; Notable for the following components:   Color, Urine YELLOW (*)    APPearance HAZY (*)    Glucose, UA >=500 (*)    Protein, ur 30 (*)    All other components within normal limits  BLOOD GAS, ARTERIAL - Abnormal; Notable for the following components:   pH, Arterial 7.33 (*)    pO2, Arterial 170 (*)    Bicarbonate 16.9 (*)    Acid-base deficit 8.0 (*)    All other components within normal limits  CBG MONITORING, ED - Abnormal; Notable for the following components:   Glucose-Capillary 373 (*)    All  other components within normal limits  RESP PANEL BY RT-PCR (FLU A&B, COVID) ARPGX2  URINE CULTURE  CULTURE, BLOOD (ROUTINE X 2)  CULTURE, BLOOD (ROUTINE X 2)  MRSA NEXT GEN BY PCR, NASAL  BETA-HYDROXYBUTYRIC ACID  ETHANOL  BRAIN NATRIURETIC PEPTIDE  LACTIC ACID, PLASMA  CBC  MAGNESIUM  PHOSPHORUS  HEMOGLOBIN A1C  COMPREHENSIVE METABOLIC PANEL  HIV ANTIBODY (ROUTINE TESTING W REFLEX)   ____________________________________________   EKG  Interpreted by me Sinus rhythm rate of 85, normal axis and intervals.  Normal QRS ST segments and T waves.  No ischemic changes.  No evidence of toxidrome.  ____________________________________________    RADIOLOGY  CT Head Wo Contrast  Result Date: 06/05/2021 CLINICAL DATA:  Altered mental status. EXAM: CT HEAD WITHOUT CONTRAST TECHNIQUE: Contiguous axial images were obtained from the base of the skull through the vertex without intravenous contrast. COMPARISON:  January 23, 2017 FINDINGS: Brain: No evidence of acute large vascular territory infarction, hemorrhage, hydrocephalus, extra-axial collection or mass lesion/mass effect. Vascular: No hyperdense vessel. Atherosclerotic calcifications of the internal carotid arteries at the skull base. Skull: Negative for fracture or focal lesion. Sinuses/Orbits: Ring fluid in the right maxillary sinus. Orbits are unremarkable. Mastoid air cells are predominantly clear. Other: Partially visualized endotracheal tube. IMPRESSION: No acute intracranial abnormality visualized. Electronically Signed   By: Dahlia Bailiff MD   On: 06/05/2021 18:44   DG Chest Portable 1 View  Result Date: 06/05/2021 CLINICAL DATA:  Endotracheal and nasogastric tube placement. EXAM: PORTABLE CHEST 1 VIEW COMPARISON:  12/23/2016 FINDINGS: Motion and patient body habitus degradation. Endotracheal tube terminates 2.0 cm above carina. Nasogastric tube terminates in the distal esophagus. Cardiomegaly accentuated by AP portable  technique. No right-sided pleural effusion. Left pleural space not well evaluated secondary to overlying soft tissues. No pneumothorax. Interstitial prominence and indistinctness. No right-sided lobar consolidation. Inferior left lung poorly evaluated. IMPRESSION: Nasogastric tube terminating in the distal esophagus. This should be advanced 8-10 cm for optimal positioning. Endotracheal tube likely appropriately positioned on this reverse apical lordotic film. Otherwise, moderately limited exam secondary to patient's size and mild motion. Cardiomegaly with probable interstitial edema. Suboptimal evaluation of the inferior left hemithorax. These results will be called to the ordering clinician or representative by the Radiologist Assistant, and communication documented in the PACS or [employer]. Electronically Signed   By: Abigail Miyamoto M.D.   On: 06/05/2021 18:39    ____________________________________________   PROCEDURES .Critical Care  Date/Time: 06/05/2021 7:54 PM Performed by: Carrie Mew, MD Authorized by: Carrie Mew, MD   Critical care provider statement:    Critical care time (minutes):  62   Critical care time was exclusive of:  Separately billable procedures and treating other patients   Critical care was necessary to treat or prevent imminent or life-threatening deterioration of the following conditions:  Respiratory failure and CNS failure or compromise   Critical care was time spent personally by me on the following activities:  Development of treatment plan with patient or surrogate, discussions with consultants, evaluation of patient's response to treatment, examination of patient, obtaining history from patient or surrogate, ordering and performing treatments and interventions, ordering and review of laboratory studies, ordering and review of radiographic studies, pulse oximetry, re-evaluation of patient's condition and review of old charts Comments:         Procedure Name: Intubation Date/Time: 06/05/2021 7:55 PM Performed by: Carrie Mew, MD Pre-anesthesia Checklist: Patient identified, Patient being monitored, Emergency Drugs available, Timeout performed and Suction available Oxygen Delivery Method: Non-rebreather mask Preoxygenation: Pre-oxygenation with 100% oxygen Induction Type: Rapid sequence Ventilation: Mask ventilation without difficulty Laryngoscope Size: Glidescope and 3 Grade View: Grade I Tube type: Subglottic suction tube Number of attempts: 1 Placement Confirmation: ETT inserted through vocal cords under direct vision, CO2 detector and Breath sounds checked- equal and bilateral Secured at: 22 cm Tube secured with: ETT holder Dental Injury: Teeth and Oropharynx as per pre-operative assessment      ____________________________________________  DIFFERENTIAL DIAGNOSIS   Intracranial hemorrhage, electrolyte abnormality, seizure, DKA, sepsis, sedative intoxication  CLINICAL IMPRESSION / ASSESSMENT AND PLAN / ED COURSE  Medications ordered in the ED: Medications  sodium chloride 0.9 % bolus 1,000 mL (1,000 mLs Intravenous New Bag/Given 06/05/21 1831)  0.9 %  sodium chloride infusion (250 mLs Intravenous New Bag/Given 06/05/21 1911)  vancomycin (VANCOCIN) IVPB 1000 mg/200 mL premix (1,000 mg Intravenous New Bag/Given 06/05/21 1913)    Followed by  vancomycin (VANCOREADY) IVPB 1500 mg/300 mL (has no administration in time range)  docusate sodium (COLACE) capsule 100 mg (has no administration in time range)  polyethylene glycol (MIRALAX / GLYCOLAX) packet 17 g (has no administration in time range)  enoxaparin (LOVENOX) injection 80 mg (80 mg Subcutaneous Given 06/05/21 2141)  insulin aspart (novoLOG) injection 0-20 Units (has no administration in time range)  naloxone HCl (NARCAN) 4 mg in dextrose 5 % 250 mL infusion (1 mg/hr Intravenous Rate/Dose Change 06/05/21 2133)  Chlorhexidine Gluconate Cloth 2 % PADS 6 each  (has no administration in time range)  naloxone [facility]) injection 0.4 mg (0.4 mg Intravenous Given 06/05/21 2030)  pantoprazole (PROTONIX) injection 40 mg (40 mg Intravenous Given 06/05/21 2140)  albuterol (PROVENTIL) (2.5 MG/3ML) 0.083% nebulizer solution 2.5 mg (has no administration in time range)  rocuronium (ZEMURON) injection 80 mg (80 mg Intravenous Given 06/05/21 1736)  etomidate (AMIDATE) injection 15 mg (15 mg Intravenous Given 06/05/21 1735)  fentaNYL (SUBLIMAZE) injection 100 mcg (100 mcg Intravenous Given 06/05/21 1743)  sodium chloride 0.9 % bolus 1,000 mL (0 mLs Intravenous Stopped 06/05/21 1855)  ceFEPIme (MAXIPIME) 2 g in sodium chloride 0.9 % 100 mL IVPB (0 g Intravenous Stopped 06/05/21 1914)  metroNIDAZOLE (FLAGYL) IVPB 500 mg (0 mg Intravenous Stopped 06/05/21 1957)  naloxone (NARCAN) injection 0.4 mg (0.4 mg Intravenous Given 06/05/21 1946)  naloxone (NARCAN) 0.4 MG/ML injection (0.4 mg  Given 06/05/21 2142)    Pertinent labs & imaging results that were available during my care of the patient were reviewed by me and considered in my medical decision making (see chart for details).   Haevyn Ury was evaluated in Emergency Department on 06/05/2021 for  the symptoms described in the history of present illness. She was evaluated in the context of the global COVID-19 pandemic, which necessitated consideration that the patient might be at risk for infection with the SARS-CoV-2 virus that causes COVID-19. Institutional protocols and algorithms that pertain to the evaluation of patients at risk for COVID-19 are in a state of rapid change based on information released by regulatory bodies including the CDC and federal and state organizations. These policies and algorithms were followed during the patient's care in the ED.     Clinical Course as of 06/05/21 2152  Nancy Fetter Jun 05, 2021  1749 Patient presents unresponsive/comatose, respiratory rate of 4 requiring bagging by EMS prior to  arrival.  Intubated immediately due to GCS of 3.  Vital signs otherwise unremarkable, differential is broad and includes intracranial hemorrhage, dehydration, DKA, electrolyte abnormality, infection.  Exam is otherwise nonfocal. [PS]  1815 Patient having persistent hypotension with a MAP of 60.  Currently receiving 2 L IV fluid bolus.  She is morbidly obese with BMI greater than 35, so I am giving her a 30 mL/kg IV fluid bolus based on ideal body weight which is 64 kg.  2 L fluid bolus is sufficient initial resuscitation.  We will also start Levophed. [PS]    Clinical Course User Index [PS] Carrie Mew, MD    ----------------------------------------- 7:57 PM on 06/05/2021 ----------------------------------------- Patient seen by intensivist.  Did awaken with Narcan trial, will go to ICU on Narcan infusion.  Intensivist and RT will attempt to extubate.   ____________________________________________   FINAL CLINICAL IMPRESSION(S) / ED DIAGNOSES    Final diagnoses:  Glasgow coma scale total score 3-8, at arrival to emergency department [facility])  Type 2 diabetes mellitus without complication, unspecified whether long term insulin use ([place])  Chronic obstructive pulmonary disease, unspecified COPD type ([place])  Morbid obesity with BMI of 45.0-49.9, adult ([place])  Seizures [facility])     ED Discharge Orders     None       Portions of this note were generated with dragon dictation software. Dictation errors may occur despite best attempts at proofreading.   Carrie Mew, MD 06/05/21 2152

## 2021-06-05 NOTE — ED Notes (Signed)
OG tube was not situated properly per xray. This nurse and Dr Joni Fears attempted to reposition but was unsuccessful. Going to CT now. Will re attempt when return.

## 2021-06-05 NOTE — ED Notes (Signed)
Taking pt to CT with RT.

## 2021-06-06 ENCOUNTER — Inpatient Hospital Stay: Payer: Medicare Other

## 2021-06-06 DIAGNOSIS — R7401 Elevation of levels of liver transaminase levels: Secondary | ICD-10-CM

## 2021-06-06 DIAGNOSIS — E1165 Type 2 diabetes mellitus with hyperglycemia: Secondary | ICD-10-CM

## 2021-06-06 DIAGNOSIS — E872 Acidosis, unspecified: Secondary | ICD-10-CM

## 2021-06-06 DIAGNOSIS — T402X1A Poisoning by other opioids, accidental (unintentional), initial encounter: Secondary | ICD-10-CM

## 2021-06-06 DIAGNOSIS — J9602 Acute respiratory failure with hypercapnia: Secondary | ICD-10-CM

## 2021-06-06 DIAGNOSIS — J9601 Acute respiratory failure with hypoxia: Secondary | ICD-10-CM

## 2021-06-06 LAB — HEMOGLOBIN A1C
Hgb A1c MFr Bld: 7.3 % — ABNORMAL HIGH (ref 4.8–5.6)
Mean Plasma Glucose: 162.81 mg/dL

## 2021-06-06 LAB — COMPREHENSIVE METABOLIC PANEL
ALT: 46 U/L — ABNORMAL HIGH (ref 0–44)
AST: 37 U/L (ref 15–41)
Albumin: 3 g/dL — ABNORMAL LOW (ref 3.5–5.0)
Alkaline Phosphatase: 101 U/L (ref 38–126)
Anion gap: 5 (ref 5–15)
BUN: 32 mg/dL — ABNORMAL HIGH (ref 8–23)
CO2: 21 mmol/L — ABNORMAL LOW (ref 22–32)
Calcium: 8.5 mg/dL — ABNORMAL LOW (ref 8.9–10.3)
Chloride: 112 mmol/L — ABNORMAL HIGH (ref 98–111)
Creatinine, Ser: 1.64 mg/dL — ABNORMAL HIGH (ref 0.44–1.00)
GFR, Estimated: 35 mL/min — ABNORMAL LOW (ref >60)
Glucose, Bld: 111 mg/dL — ABNORMAL HIGH (ref 70–99)
Potassium: 4.4 mmol/L (ref 3.5–5.1)
Sodium: 138 mmol/L (ref 135–145)
Total Bilirubin: 0.7 mg/dL (ref 0.3–1.2)
Total Protein: 6.1 g/dL — ABNORMAL LOW (ref 6.5–8.1)

## 2021-06-06 LAB — CBC
HCT: 39.6 % (ref 36.0–46.0)
Hemoglobin: 12.7 g/dL (ref 12.0–15.0)
MCH: 28.3 pg (ref 26.0–34.0)
MCHC: 32.1 g/dL (ref 30.0–36.0)
MCV: 88.4 fL (ref 80.0–100.0)
Platelets: 297 10*3/uL (ref 150–400)
RBC: 4.48 MIL/uL (ref 3.87–5.11)
RDW: 14 % (ref 11.5–15.5)
WBC: 12.4 10*3/uL — ABNORMAL HIGH (ref 4.0–10.5)
nRBC: 0 % (ref 0.0–0.2)

## 2021-06-06 LAB — GLUCOSE, CAPILLARY
Glucose-Capillary: 111 mg/dL — ABNORMAL HIGH (ref 70–99)
Glucose-Capillary: 103 mg/dL — ABNORMAL HIGH (ref 70–99)
Glucose-Capillary: 137 mg/dL — ABNORMAL HIGH (ref 70–99)
Glucose-Capillary: 140 mg/dL — ABNORMAL HIGH (ref 70–99)
Glucose-Capillary: 217 mg/dL — ABNORMAL HIGH (ref 70–99)

## 2021-06-06 LAB — HIV ANTIBODY (ROUTINE TESTING W REFLEX): HIV Screen 4th Generation wRfx: NONREACTIVE

## 2021-06-06 LAB — PHOSPHORUS: Phosphorus: 4 mg/dL (ref 2.5–4.6)

## 2021-06-06 LAB — LACTIC ACID, PLASMA: Lactic Acid, Venous: 1.2 mmol/L (ref 0.5–1.9)

## 2021-06-06 LAB — MAGNESIUM: Magnesium: 1.9 mg/dL (ref 1.7–2.4)

## 2021-06-06 LAB — PROCALCITONIN: Procalcitonin: <0.1 ng/mL

## 2021-06-06 MED ORDER — FLUTICASONE PROPIONATE 50 MCG/ACT NA SUSP
2.0000 | Freq: Every day | NASAL | Status: DC
  Administered 2021-06-06 – 2021-06-07 (×2): 2 via NASAL
  Filled 2021-06-06: qty 16

## 2021-06-06 MED ORDER — OXYCODONE HCL 5 MG PO TABS
5.0000 mg | ORAL_TABLET | Freq: Three times a day (TID) | ORAL | Status: DC | PRN
  Administered 2021-06-06 – 2021-06-07 (×3): 5 mg via ORAL
  Filled 2021-06-06 (×3): qty 1

## 2021-06-06 MED ORDER — ZOLPIDEM TARTRATE 5 MG PO TABS
5.0000 mg | ORAL_TABLET | Freq: Every evening | ORAL | Status: DC | PRN
  Administered 2021-06-06: 5 mg via ORAL
  Filled 2021-06-06: qty 1

## 2021-06-06 MED ORDER — ENOXAPARIN SODIUM 60 MG/0.6ML IJ SOSY
60.0000 mg | PREFILLED_SYRINGE | INTRAMUSCULAR | Status: DC
  Administered 2021-06-06: 60 mg via SUBCUTANEOUS
  Filled 2021-06-06 (×2): qty 0.6

## 2021-06-06 NOTE — Progress Notes (Signed)
Accepted from [facility] for [place] pickup on 06/07/2021 at 7 AM as an attending.  60 year old female with chronic pain syndrome with TENS spinal device and chronic narcotics came in acutely comatose due to ovedose of narcotics, was placed on norcan drip extubated and now stable for medical floor.

## 2021-06-06 NOTE — Progress Notes (Signed)
Inpatient Diabetes Program Recommendations  AACE/ADA: New Consensus Statement on Inpatient Glycemic Control (2015)  Target Ranges:  Prepandial:   less than 140 mg/dL      Peak postprandial:   less than 180 mg/dL (1-2 hours)      Critically ill patients:  140 - 180 mg/dL   Lab Results  Component Value Date   GLUCAP 103 (H) 06/06/2021   HGBA1C 7.3 (H) 06/05/2021    Review of Glycemic Control Results for CHAUNTELLE, AZPEITIA (MRN [id number]) as of 06/06/2021 09:27  Ref. Range 06/05/2021 21:10 06/05/2021 22:59 06/06/2021 04:22 06/06/2021 07:51  Glucose-Capillary Latest Ref Range: 70 - 99 mg/dL 297 (H) 228 (H) 111 (H) 103 (H)   Diabetes history: Type 2 DM Outpatient Diabetes medications: Rybelsus 14 mg QD, Farxiga 10 mg QD, Metformin 1000 mg QD, Onglyza 5 mg QD Current orders for Inpatient glycemic control: Novolog 0-20 units Q4H  Inpatient Diabetes Program Recommendations:    Noted consult.  Given renal function may want to reduce correction to Novolog 0-9 units Q4H. Current A1C at goal outpatient.   Thanks, Bronson Curb, MSN, RNC-OB Diabetes Coordinator [phone] (8a-5p)

## 2021-06-06 NOTE — Evaluation (Signed)
Physical Therapy Evaluation Patient Details Name: Lindsey [name] MRN: [id number] DOB: September 01, 1961 Today's Date: 06/06/2021   History of Present Illness  Pt is a 60 y/o F who presented to [facility] ED from home via EMS unresponsive with a pulse being actively bagged. Grandson saw her well 45 minutes prior; he does report finding a bottle of oxycodone next to the bed but is unsure of how much she might have taken. Pt with hx of DM2 and it's suspected pt has had worsening kidney function & her body has been clearing opiates less effectively. PMH: knee arthritis, COPD, DM, heart attack (2002), HLD, HTN, sleep apnea, spinal cord stimulator for lower back pain, stroke (2004), vertigo  Clinical Impression  Pt seen for PT evaluation with pt eager to participate. Pt reports independence with mobility with PRN use of SPC 2/2 chronic back pain. Pt is able to complete transfers without AD, uses bathroom in room without assistance, and ambulates 1 lap around nursts station with decreased gait speed & forward trunk lean, without AD & without LOB. Pt reports fatigue after ambulation but notes she's at baseline level of mobility. At this time pt does not demonstrate any acute PT needs. PT to sign off & pt in agreement.   BP in RUE:  Supine in bed at beginning of session 97/78 mmHg (MAP 86) Stand pivot to recliner, pt sitting BP 112/81 mmHg (MAP 91) After gait while sitting on toilet BP 120/81 mmHg (MAP 93)     Follow Up Recommendations No PT follow up;Supervision - Intermittent    Equipment Recommendations  None recommended by PT    Recommendations for Other Services       Precautions / Restrictions Precautions Precautions: None Restrictions Weight Bearing Restrictions: No      Mobility  Bed Mobility Overal bed mobility: Modified Independent             General bed mobility comments: supine>sit with HOB elevated    Transfers Overall transfer level: Independent Equipment used:  None Transfers: Sit to/from [employer] to Stand: Independent Stand pivot transfers: Independent          Ambulation/Gait Ambulation/Gait assistance: Modified independent (Device/Increase time) Gait Distance (Feet): 110 Feet Assistive device: None Gait Pattern/deviations: Decreased stride length;Trunk flexed Gait velocity: decreased      Stairs            Wheelchair Mobility    Modified Rankin (Stroke Patients Only)       Balance Overall balance assessment: Mild deficits observed, not formally tested                                           Pertinent Vitals/Pain Pain Assessment: 0-10 Pain Score: 8  Pain Location: low back Pain Descriptors / Indicators: Sore (chronic) Pain Intervention(s): Monitored during session;Patient requesting pain meds-RN notified    Home Living Family/patient expects to be discharged to:: Private residence Living Arrangements:  (68 y/o grandson & 67 y/o granddaughter)   Type of Home: Apartment Home Access: Level entry     Home Layout: One level Home Equipment: Wheelchair - manual;Cane - single point      Prior Function Level of Independence: Independent with assistive device(s)         Comments: PRN use of SPC 2/2 chronic LBP, driving, no falls in the past 6 months     Hand Dominance  Extremity/Trunk Assessment   Upper Extremity Assessment Upper Extremity Assessment: Overall WFL for tasks assessed    Lower Extremity Assessment Lower Extremity Assessment: Overall WFL for tasks assessed    Cervical / Trunk Assessment Cervical / Trunk Assessment: Kyphotic (flexed trunk 2/2 chronic LBP)  Communication   Communication: No difficulties  Cognition Arousal/Alertness: Awake/alert Behavior During Therapy: WFL for tasks assessed/performed Overall Cognitive Status: Within Functional Limits for tasks assessed                                        General  Comments General comments (skin integrity, edema, etc.): Pt uses bathroom with continent BM & void & performs peri hygiene without assistance, dons socks without assistance at beginning of session    Exercises     Assessment/Plan    PT Assessment Patent does not need any further PT services  PT Problem List         PT Treatment Interventions      PT Goals (Current goals can be found in the Care Plan section)  Acute Rehab PT Goals Patient Stated Goal: go home PT Goal Formulation: With patient Time For Goal Achievement: 06/20/21 Potential to Achieve Goals: Good    Frequency     Barriers to discharge        Co-evaluation               AM-PAC PT "6 Clicks" Mobility  Outcome Measure Help needed turning from your back to your side while in a flat bed without using bedrails?: None Help needed moving from lying on your back to sitting on the side of a flat bed without using bedrails?: None Help needed moving to and from a bed to a chair (including a wheelchair)?: None Help needed standing up from a chair using your arms (e.g., wheelchair or bedside chair)?: None Help needed to walk in hospital room?: None Help needed climbing 3-5 steps with a railing? : None 6 Click Score: 24    End of Session Equipment Utilized During Treatment: Gait belt Activity Tolerance: Patient tolerated treatment well Patient left: in chair;with call bell/phone within reach Nurse Communication: Mobility status      Time: 6579-0383 PT Time Calculation (min) (ACUTE ONLY): 30 min   Charges:   PT Evaluation $PT Eval Low Complexity: 1 Low PT Treatments $Therapeutic Activity: 8-22 mins        Lavone Nian, PT, DPT 06/06/21, 4:08 PM   Waunita Schooner 06/06/2021, 4:05 PM

## 2021-06-06 NOTE — Progress Notes (Signed)
NAME:  Lindsey [name], MRN:  [id number], DOB:  09-May-1961, LOS: 1 ADMISSION DATE:  06/05/2021, CONSULTATION DATE:  06/05/21 REFERRING MD:  Dr. Joni Fears, CHIEF COMPLAINT:  unresponsive, respiratory failure  History of Present Illness:  60 yo F presented to [facility] ED from home via EMS unresponsive with a pulse being actively bagged. Grandson who lives with the patient reported seeing her well 45 minutes before finding her unresponsive in her bedroom. He reports this has happened before, when she was wearing opioid pain patches. He also reported finding a bottle of oxycodone next to the bed, but is unsure how much she might have taken. ED course: Patient intubated emergently in ED requiring mechanical ventilation and vasopressor support.  Initial Vitals: T- 97.5, RR 4 (prior to intubation), ST 108, BP- 91/66 (prior to intubation) Significant labs: mildly hyponatremic at 366, non-AG metabolic acidosis with Serum CO2 at 19 and AG at 7, CBG 333, osmolality elevated at 312, mild transaminitis: AST-69, ALT-50, leukocytosis- 17, lactic acidosis- 2.8, COVID-19 negative, bet-hydroxy: 0.12, UA negative, alcohol level < 10, tox screen: +benzo, +opiates, +tricyclics. CTH: negative for acute intracranial abnormality, CXR: showed cardiomegaly and mild interstitial edema however difficult to fully evaluate LLL  PCCM consulted to admit.   Events:  06/05/21- Admit to ICU in Acute hypoxic respiratory failure, emergently intubated in ED requiring mechanical ventilation and vasopressor support. Narcan given, patient awake and responsive > will attempt to extubate 06/06/21- patient reports resolution of all symptoms.  She is AAOx3.  She reports unintentional overdose of narcotics .  Plan for transfer to [facility].    Pertinent  Medical History  COPD - has O2 at home but does not wear per grandson report T2DM CAD with MI (2002) HLD HTN PUD OSA CVA (2004) Vertigo   Objective   Blood pressure 104/75, pulse 87,  temperature (!) 96.7 F (35.9 C), temperature source Core, resp. rate 13, height 5\' 7"  (1.702 m), weight 123.4 kg, SpO2 99 %.    Vent Mode: AC FiO2 (%):  [40 %] 40 % Set Rate:  [16 bmp] 16 bmp Vt Set:  [500 mL] 500 mL PEEP:  [5 cmH20] 5 cmH20   Intake/Output Summary (Last 24 hours) at 06/06/2021 2947 Last data filed at 06/06/2021 0500 Gross per 24 hour  Intake 2325.11 ml  Output --  Net 2325.11 ml    Filed Weights   06/05/21 1735 06/06/21 0500  Weight: (!) 158.8 kg 123.4 kg    Examination: General: Adult female NAD HEENT: MM pink/moist, anicteric, atraumatic, neck supple Neuro: no FND grossly CV: s1s2 RRR, NSR on monitor, no r/m/g Pulm: Regular, non labored on AC at 40%, PEEP 5, breath sounds clear-BUL & diminished-BLL GI: soft, rounded, non tender, bs x 4 GU: foley in place with clear yellow urine Skin:  no rashes/lesions noted Extremities: warm/dry, pulses + 2 R/P, no edema noted  Resolved Hospital Problem list     Assessment & Plan:  Acute Hypoxic / Hypercapnic Respiratory Failure secondary to of Suspected Unintentional Opioid Overdose   PMHx: chronic opioid use, COPD, OSA (not on chronic O2) - RESOLVED PLAN TO DOWNGRADE TO TRH AND PERFORM PT/OT   Lactic Acidosis  Leukocytosis Lactic: 2.8 > 2.0, PCT pending Patient denies any s/s of infection, she is prescribed metformin outpatient- unclear if she's been taking this regularly. She received 1 L of NS and empiric abx: cefepime, flagyl & vancomycin. Will hold off on further antibiotics at this time - BNP normal so will give additional liter of  NS slowly - trend lactic/ PCT - daily CBC, monitor WBC & fever curve  Acute Kidney Injury on suspected CKD in the setting of hypoperfusion Non-AG Metabolic Acidosis Baseline Cr: unclear (patient reports PCP concerned about worsening function-most recent Cr in EMR fr 04/2020: 1.70, Cr on admission: 2.42, UA + for protein Patient does admit to taking Naprosyn outpatient BID -  Strict I/O's: alert provider if UOP < 0.5 mL/kg/hr - gentle IVF hydration  - Daily BMP, replace electrolytes PRN - Avoid nephrotoxic agents as able, ensure adequate renal perfusion > patient counseled to avoid NSAIDs - Consider nephrology consult  - consider renal US  Transaminitis in the setting of hypoperfusion - Trend hepatic function - Consider RUQ [place] - if not normalized by AM - avoid hepatotoxic agents - hold simvastatin for now  Poorly controlled Type 2 Diabetes Mellitus Hemoglobin A1C: pending Patient reports being prescribed insulin by PCP but not wanting to take it because of her mother's experience with insulin. Counseled the patient on the importance of controlling her glucose and how it impacts her kidney function as well as her overall health - Monitor CBG Q 4 hours - SSI resistant dosing - target range while in ICU: 140-180 - follow ICU hyper/hypo-glycemia protocol - consult Diabetes Coordinator  Best Practice (right click and "Reselect all SmartList Selections" daily)  Diet/type: NPO DVT prophylaxis: LMWH GI prophylaxis: PPI Lines: N/A Foley:  Yes, and it is still needed Code Status:  full code Last date of multidisciplinary goals of care discussion 06/05/21  Labs   CBC: Recent Labs  Lab 06/05/21 1742 06/06/21 0501  WBC 17.0* 12.4*  NEUTROABS 8.1*  --   HGB 12.4 12.7  HCT 40.1 39.6  MCV 92.4 88.4  PLT 338 297     Basic Metabolic Panel: Recent Labs  Lab 06/05/21 1742 06/06/21 0501  NA 134* 138  K 4.9 4.4  CL 108 112*  CO2 19* 21*  GLUCOSE 333* 111*  BUN 36* 32*  CREATININE 2.42* 1.64*  CALCIUM 8.1* 8.5*  MG  --  1.9  PHOS  --  4.0    GFR: Estimated Creatinine Clearance: 48.5 mL/min (A) (by C-G formula based on SCr of 1.64 mg/dL (H)). Recent Labs  Lab 06/05/21 1742 06/05/21 1749 06/05/21 2113 06/06/21 0501  PROCALCITON <0.10  --   --   --   WBC 17.0*  --   --  12.4*  LATICACIDVEN  --  2.8* 2.0* 1.2     Liver Function  Tests: Recent Labs  Lab 06/05/21 1742 06/06/21 0501  AST 69* 37  ALT 50* 46*  ALKPHOS 106 101  BILITOT 1.0 0.7  PROT 6.4* 6.1*  ALBUMIN 3.2* 3.0*    No results for input(s): LIPASE, AMYLASE in the last 168 hours. No results for input(s): AMMONIA in the last 168 hours.  ABG    Component Value Date/Time   PHART 7.33 (L) 06/05/2021 1925   PCO2ART 32 06/05/2021 1925   PO2ART 170 (H) 06/05/2021 1925   HCO3 16.9 (L) 06/05/2021 1925   ACIDBASEDEF 8.0 (H) 06/05/2021 1925   O2SAT 99.4 06/05/2021 1925      Coagulation Profile: No results for input(s): INR, PROTIME in the last 168 hours.  Cardiac Enzymes: No results for input(s): CKTOTAL, CKMB, CKMBINDEX, TROPONINI in the last 168 hours.  HbA1C: Hgb A1c MFr Bld  Date/Time Value Ref Range Status  06/05/2021 09:13 PM 7.3 (H) 4.8 - 5.6 % Final    Comment:    (NOTE) Pre  diabetes:          5.7%-6.4%  Diabetes:              >6.4%  Glycemic control for   <7.0% adults with diabetes   05/07/2020 12:24 AM 9.5 (H) 4.8 - 5.6 % Final    Comment:    (NOTE) Pre diabetes:          5.7%-6.4%  Diabetes:              >6.4%  Glycemic control for   <7.0% adults with diabetes     CBG: Recent Labs  Lab 06/05/21 1733 06/05/21 2110 06/05/21 2259 06/06/21 0422 06/06/21 0751  GLUCAP 373* 297* 228* 111* 103*     Review of Systems: Positives in BOLD (interview completed post extubation)  Gen: Denies fever, chills, weight change, fatigue, night sweats HEENT: Denies blurred vision, double vision, hearing loss, tinnitus, sinus congestion, rhinorrhea, sore throat, neck stiffness, dysphagia PULM: Denies shortness of breath, cough, sputum production, hemoptysis, wheezing CV: Denies chest pain, edema, orthopnea, paroxysmal nocturnal dyspnea, palpitations GI: Denies abdominal pain, nausea, vomiting, diarrhea, hematochezia, melena, constipation, change in bowel habits GU: Denies dysuria, hematuria, polyuria, oliguria, urethral  discharge Endocrine: Denies hot or cold intolerance, polyuria, polyphagia or appetite change Derm: Denies rash, dry skin, scaling or peeling skin change Heme: Denies easy bruising, bleeding, bleeding gums Neuro: Denies headache, numbness, weakness, slurred speech, loss of memory or consciousness  Past Medical History:  She,  has a past medical history of Allergy, Anemia, Arthritis, COPD (chronic obstructive pulmonary disease) ([place]), Diabetes mellitus without complication ([facility]), GERD (gastroesophageal reflux disease), Heart attack ([place]) (2002), Hyperlipidemia, Hypertension, PUD (peptic ulcer disease), Sleep apnea, Spinal cord stimulator status, Stroke ([place]) (2004), Vertigo, and Wears dentures.   Surgical History:   Past Surgical History:  Procedure Laterality Date   ABDOMINAL HYSTERECTOMY     back surgery     broken wrist     COLONOSCOPY WITH PROPOFOL N/A 01/30/2017   Procedure: COLONOSCOPY WITH PROPOFOL;  Surgeon: Jonathon Bellows, MD;  Location: [facility] ENDOSCOPY;  Service: Endoscopy;  Laterality: N/A;   COLONOSCOPY WITH PROPOFOL N/A 02/20/2017   Procedure: Colonoscopy with propofol ;  Surgeon: Jonathon Bellows, MD;  Location[facility] ENDOSCOPY;  Service: Endoscopy;  Laterality: N/A;   COLONOSCOPY WITH PROPOFOL N/A 03/13/2017   Procedure: COLONOSCOPY WITH PROPOFOL;  Surgeon: Jonathon Bellows, MD;  Location: [facility] ENDOSCOPY;  Service: Endoscopy;  Laterality: N/A;   COLONOSCOPY WITH PROPOFOL N/A 04/17/2017   Procedure: COLONOSCOPY WITH PROPOFOL;  Surgeon: Jonathon Bellows, MD;  Location: [facility] ENDOSCOPY;  Service: Endoscopy;  Laterality: N/A;   ESOPHAGOGASTRODUODENOSCOPY N/A 05/05/2020   Procedure: ESOPHAGOGASTRODUODENOSCOPY (EGD);  Surgeon: Toledo, Benay Pike, MD;  Location: [facility] ENDOSCOPY;  Service: Gastroenterology;  Laterality: N/A;   ESOPHAGOGASTRODUODENOSCOPY (EGD) WITH PROPOFOL N/A 09/28/2016   Procedure: ESOPHAGOGASTRODUODENOSCOPY (EGD) WITH PROPOFOL;  Surgeon: Manya Silvas, MD;  Location: [facility] ENDOSCOPY;  Service:  Endoscopy;  Laterality: N/A;   ESOPHAGOGASTRODUODENOSCOPY (EGD) WITH PROPOFOL N/A 03/13/2017   Procedure: ESOPHAGOGASTRODUODENOSCOPY (EGD) WITH PROPOFOL;  Surgeon: Jonathon Bellows, MD;  Location: [facility] ENDOSCOPY;  Service: Endoscopy;  Laterality: N/A;   GALLBLADDER SURGERY     GASTRIC BYPASS     GIVENS CAPSULE STUDY N/A 03/13/2017   Procedure: GIVENS CAPSULE STUDY;  Surgeon: Jonathon Bellows, MD;  Location: [facility] ENDOSCOPY;  Service: Endoscopy;  Laterality: N/A;   HERNIA REPAIR     JOINT REPLACEMENT  1995   REPLACEMENT TOTAL KNEE     ROTATOR CUFF REPAIR  Social History:   reports that she has never smoked. She has never used smokeless tobacco. She reports that she does not drink alcohol and does not use drugs.   Family History:  Her family history includes COPD in her father; Heart disease in her father; Hypertension in her father. There is no history of Breast cancer.   Allergies Allergies  Allergen Reactions   Morphine Hives   Iodinated Diagnostic Agents Hives   Aspirin Hives    Noted on MD progress notes and discussed with MD 09/02/15   Etodolac Hives   Ibuprofen Hives   Shellfish Allergy Hives   Tylenol [Acetaminophen] Hives    Upset stomach      Home Medications  Prior to Admission medications   Medication Sig Start Date End Date Taking? Authorizing Provider  albuterol (PROVENTIL HFA;VENTOLIN HFA) 108 (90 BASE) MCG/ACT inhaler Inhale 2 puffs into the lungs every 4 (four) hours as needed for wheezing or shortness of breath.    [provider]  albuterol (PROVENTIL) (2.5 MG/3ML) 0.083% nebulizer solution Take 2.5 mg by nebulization every 6 (six) hours as needed for wheezing or shortness of breath.    [provider]  ALPRAZolam Duanne Moron) 0.25 MG tablet Take 0.25 mg by mouth 3 (three) times daily as needed for anxiety.     [provider]  baclofen (LIORESAL) 10 MG tablet Take 10 mg by mouth 3 (three) times daily.    [provider]  busPIRone (BUSPAR)  7.5 MG tablet Take 7.5 mg by mouth 2 (two) times daily.    [provider]  Calcium Carbonate-Vitamin D (OYSTER SHELL CALCIUM 500 + D PO)  12/26/16   [provider]  Calcium Lactate 648 MG TABS Take 648 mg by mouth 2 (two) times daily.    [provider]  dapagliflozin propanediol (FARXIGA) 10 MG TABS tablet Take 10 mg by mouth daily.    [provider]  desvenlafaxine (PRISTIQ) 100 MG 24 hr tablet Take 100 mg by mouth daily. Pt takes 100 mg tab and 50 mg tab for total of 150 mg    [provider]  desvenlafaxine (PRISTIQ) 50 MG 24 hr tablet Take 50 mg by mouth daily.     [provider]  esomeprazole (NEXIUM) 40 MG capsule Take 40 mg by mouth 2 (two) times daily.    [provider]  fentaNYL (DURAGESIC - DOSED MCG/HR) 12 MCG/HR Place 12 mcg onto the skin every 3 (three) days.    [provider]  fentaNYL (DURAGESIC - DOSED MCG/HR) 25 MCG/HR patch Place 25 mcg onto the skin every 3 (three) days.    [provider]  ferrous sulfate 325 (65 FE) MG tablet Take 1 tablet (325 mg total) by mouth 2 (two) times daily with a meal. 12/26/16   Sudini, Srikar, MD  fexofenadine (ALLEGRA) 180 MG tablet Take 180 mg by mouth daily.  01/25/17   [provider]  fluticasone (FLONASE) 50 MCG/ACT nasal spray Place 1 spray into both nostrils 2 (two) times daily.    [provider]  folic acid (FOLVITE) 1 MG tablet Take 1 mg by mouth daily.    [provider]  hydrOXYzine (ATARAX/VISTARIL) 25 MG tablet Take 25 mg by mouth 3 (three) times daily.    [provider]  meclizine (ANTIVERT) 25 MG tablet Take 1 tablet (25 mg total) by mouth 3 (three) times daily as needed for dizziness or nausea. 01/23/17   Earleen Newport, MD  metFORMIN (  GLUCOPHAGE-XR) 500 MG 24 hr tablet Take 1,000 mg by mouth daily with breakfast.    [provider]  metoprolol succinate (TOPROL-XL) 100 MG 24 hr tablet Take 100 mg by  mouth daily. Take with or immediately following a meal.    [provider]  mirtazapine (REMERON) 45 MG tablet Take 45 mg by mouth at bedtime.    [provider]  nitroGLYCERIN (NITROSTAT) 0.3 MG SL tablet Place under the tongue.    [provider]  olmesartan-hydrochlorothiazide (BENICAR HCT) 20-12.5 MG tablet Take 1 tablet by mouth daily.    [provider]  ONE TOUCH ULTRA TEST test strip  01/25/17   [provider]  ONGLYZA 5 MG TABS tablet Take 1 tablet by mouth daily. 08/28/16   [provider]  oxybutynin (DITROPAN) 5 MG tablet Take 5 mg by mouth 2 (two) times daily.    [provider]  oxyCODONE (OXY IR/ROXICODONE) 5 MG immediate release tablet Take 5 mg by mouth every 6 (six) hours as needed for severe pain.    [provider]  phenazopyridine (PYRIDIUM) 100 MG tablet Take 100 mg by mouth 3 (three) times daily as needed for pain.    [provider]  potassium chloride (K-DUR) 10 MEQ tablet Take 10 mEq by mouth daily.    [provider]  Semaglutide (RYBELSUS) 14 MG TABS Take 1 tablet by mouth in the morning.    [provider]  simvastatin (ZOCOR) 40 MG tablet Take 1 tablet by mouth daily. 09/24/16   [provider]  sucralfate (CARAFATE) 1 g tablet Take 1 g by mouth 4 (four) times daily.     [provider]  Suvorexant (BELSOMRA) 15 MG TABS Take 1 tablet by mouth at bedtime.    [provider]  tamsulosin (FLOMAX) 0.4 MG CAPS capsule Take 0.4 mg by mouth daily. 04/09/17   [provider]  zolpidem (AMBIEN) 10 MG tablet Take 10 mg by mouth at bedtime as needed for sleep.    [provider]     Critical care provider statement:    Critical care time (minutes):  33   Critical care time was exclusive of:  Separately billable procedures and  treating other patients   Critical care was necessary to treat or prevent imminent or  life-threatening  deterioration of the following conditions:  Acutely comatose state,  acute hypoxemic respiratory failure, multiple comorbid conditions.    Critical care was time spent personally by me on the following  activities:  Development of treatment plan with patient or surrogate,  discussions with consultants, evaluation of patient's response to  treatment, examination of patient, obtaining history from patient or  surrogate, ordering and performing treatments and interventions, ordering  and review of laboratory studies and re-evaluation of patient's condition   I assumed direction of critical care for this patient from another  provider in my specialty: no         Ottie Glazier, M.D.  Pulmonary & [place]

## 2021-06-07 ENCOUNTER — Other Ambulatory Visit: Payer: Self-pay

## 2021-06-07 DIAGNOSIS — I959 Hypotension, unspecified: Secondary | ICD-10-CM | POA: Diagnosis not present

## 2021-06-07 DIAGNOSIS — J9601 Acute respiratory failure with hypoxia: Secondary | ICD-10-CM | POA: Diagnosis not present

## 2021-06-07 DIAGNOSIS — N179 Acute kidney failure, unspecified: Secondary | ICD-10-CM | POA: Diagnosis not present

## 2021-06-07 DIAGNOSIS — N189 Chronic kidney disease, unspecified: Secondary | ICD-10-CM

## 2021-06-07 DIAGNOSIS — E872 Acidosis: Secondary | ICD-10-CM | POA: Diagnosis not present

## 2021-06-07 DIAGNOSIS — G8929 Other chronic pain: Secondary | ICD-10-CM

## 2021-06-07 DIAGNOSIS — R7989 Other specified abnormal findings of blood chemistry: Secondary | ICD-10-CM

## 2021-06-07 DIAGNOSIS — J9602 Acute respiratory failure with hypercapnia: Secondary | ICD-10-CM

## 2021-06-07 LAB — URINE CULTURE: Culture: NO GROWTH

## 2021-06-07 LAB — GLUCOSE, CAPILLARY
Glucose-Capillary: 101 mg/dL — ABNORMAL HIGH (ref 70–99)
Glucose-Capillary: 116 mg/dL — ABNORMAL HIGH (ref 70–99)
Glucose-Capillary: 96 mg/dL (ref 70–99)

## 2021-06-07 LAB — PROCALCITONIN: Procalcitonin: 0.14 ng/mL

## 2021-06-07 MED ORDER — NALOXONE HCL 4 MG/0.1ML NA LIQD: NASAL | 0 refills | Status: DC

## 2021-06-07 MED ORDER — TRESIBA FLEXTOUCH 200 UNIT/ML ~~LOC~~ SOPN: 15.0000 [IU] | PEN_INJECTOR | Freq: Every day | SUBCUTANEOUS | Status: DC

## 2021-06-07 MED ORDER — ZOLPIDEM TARTRATE 10 MG PO TABS: 5.0000 mg | ORAL_TABLET | Freq: Every evening | ORAL | 0 refills | Status: DC | PRN

## 2021-06-07 MED ORDER — OXYCODONE HCL 20 MG PO TABS: 0.5000 | ORAL_TABLET | Freq: Four times a day (QID) | ORAL | 0 refills | Status: DC

## 2021-06-07 NOTE — Plan of Care (Signed)
  Problem: Education: Goal: Knowledge of General Education information will improve Description: Including pain rating scale, medication(s)/side effects and non-pharmacologic comfort measures 06/07/2021 1044 by Milinda Hirschfeld, RN Outcome: Adequate for Discharge 06/07/2021 0814 by Milinda Hirschfeld, RN Outcome: Progressing   Problem: Health Behavior/Discharge Planning: Goal: Ability to manage health-related needs will improve 06/07/2021 1044 by Milinda Hirschfeld, RN Outcome: Adequate for Discharge 06/07/2021 0814 by Milinda Hirschfeld, RN Outcome: Progressing   Problem: Clinical Measurements: Goal: Ability to maintain clinical measurements within normal limits will improve 06/07/2021 1044 by Milinda Hirschfeld, RN Outcome: Adequate for Discharge 06/07/2021 0814 by Milinda Hirschfeld, RN Outcome: Progressing Goal: Will remain free from infection 06/07/2021 1044 by Milinda Hirschfeld, RN Outcome: Adequate for Discharge 06/07/2021 0814 by Milinda Hirschfeld, RN Outcome: Progressing Goal: Diagnostic test results will improve 06/07/2021 1044 by Milinda Hirschfeld, RN Outcome: Adequate for Discharge 06/07/2021 0814 by Milinda Hirschfeld, RN Outcome: Progressing Goal: Respiratory complications will improve 06/07/2021 1044 by Milinda Hirschfeld, RN Outcome: Adequate for Discharge 06/07/2021 0814 by Milinda Hirschfeld, RN Outcome: Progressing Goal: Cardiovascular complication will be avoided 06/07/2021 1044 by Milinda Hirschfeld, RN Outcome: Adequate for Discharge 06/07/2021 0814 by Milinda Hirschfeld, RN Outcome: Progressing   Problem: Activity: Goal: Risk for activity intolerance will decrease 06/07/2021 1044 by Milinda Hirschfeld, RN Outcome: Adequate for Discharge 06/07/2021 0814 by Milinda Hirschfeld, RN Outcome: Progressing   Problem: Nutrition: Goal: Adequate nutrition will be maintained 06/07/2021 1044 by Milinda Hirschfeld, RN Outcome: Adequate for Discharge 06/07/2021 0814 by Milinda Hirschfeld, RN Outcome: Progressing   Problem:  Coping: Goal: Level of anxiety will decrease 06/07/2021 1044 by Milinda Hirschfeld, RN Outcome: Adequate for Discharge 06/07/2021 0814 by Milinda Hirschfeld, RN Outcome: Progressing   Problem: Elimination: Goal: Will not experience complications related to bowel motility 06/07/2021 1044 by Milinda Hirschfeld, RN Outcome: Adequate for Discharge 06/07/2021 0814 by Milinda Hirschfeld, RN Outcome: Progressing Goal: Will not experience complications related to urinary retention 06/07/2021 1044 by Milinda Hirschfeld, RN Outcome: Adequate for Discharge 06/07/2021 0814 by Milinda Hirschfeld, RN Outcome: Progressing   Problem: Pain Managment: Goal: General experience of comfort will improve 06/07/2021 1044 by Milinda Hirschfeld, RN Outcome: Adequate for Discharge 06/07/2021 0814 by Milinda Hirschfeld, RN Outcome: Progressing   Problem: Safety: Goal: Ability to remain free from injury will improve 06/07/2021 1044 by Milinda Hirschfeld, RN Outcome: Adequate for Discharge 06/07/2021 0814 by Milinda Hirschfeld, RN Outcome: Progressing   Problem: Skin Integrity: Goal: Risk for impaired skin integrity will decrease 06/07/2021 1044 by Milinda Hirschfeld, RN Outcome: Adequate for Discharge 06/07/2021 0814 by Milinda Hirschfeld, RN Outcome: Progressing

## 2021-06-07 NOTE — Plan of Care (Signed)

## 2021-06-07 NOTE — Discharge Summary (Signed)
[place] [place] NAME: Lindsey [name]    MR#:  [id number]  DATE OF BIRTH:  [birth year]  DATE OF ADMISSION:  06/05/2021 ADMITTING PHYSICIAN: Ottie Glazier, MD  DATE OF DISCHARGE: 06/07/2021 10:56 AM  PRIMARY CARE PHYSICIAN: Jodi Marble, MD    ADMISSION DIAGNOSIS:  Acute respiratory failure (HCC) [J96.00] Seizures ([place]) [R56.9] Morbid obesity with BMI of 45.0-49.9, adult ([place]) [E66.01, Z68.42] Glasgow coma scale total score 3-8, at arrival to emergency department [facility]) [Y69.4854] Chronic obstructive pulmonary disease, unspecified COPD type ([place]) [J44.9] Type 2 diabetes mellitus without complication, unspecified whether long term insulin use ([place]) [E11.9]  DISCHARGE DIAGNOSIS:  Principal Problem:   Acute respiratory failure with hypoxia and hypercapnia (HCC) Active Problems:   Leukocytosis   Acute kidney injury superimposed on chronic kidney disease (HCC)   Chronic obstructive pulmonary disease, unspecified COPD type ([place])   Opioid overdose ([place])   Poorly controlled type 2 diabetes mellitus ([place])   Transaminitis   Metabolic acidosis with normal anion gap and bicarbonate losses   SECONDARY DIAGNOSIS:   Past Medical History:  Diagnosis Date   Allergy    Anemia    Arthritis    knees   COPD (chronic obstructive pulmonary disease) (HCC)    Diabetes mellitus without complication (HCC)    diet controlled   GERD (gastroesophageal reflux disease)    Heart attack ([place]) 2002   Hyperlipidemia    Hypertension    PUD (peptic ulcer disease)    Sleep apnea    can't tolerate CPAP.  Uses O2 only   Spinal cord stimulator status    for lower back pain   Stroke [facility]) 2004   "mini-stroke"   Vertigo    Wears dentures    full upper and lower    HOSPITAL COURSE:   Acute hypoxic hypercapnic respiratory failure and unresponsiveness.  Patient was intubated and admitted to the critical care service.  Extubated and respiratory status  is fine currently.  Patient breathing comfortably on room air. Acute kidney injury on chronic kidney disease stage IIIb patient was given IV fluids.  Creatinine 1.64 upon discharge. Hypotension.  All of her blood pressure medications were held.  Follow-up as outpatient. Type 2 diabetes mellitus with hyperlipidemia.  Last hemoglobin A1c 7.3.  Patient does not want to be on insulin anymore.  Patient was only given sliding scale here in the hospital by the critical care team.  Can go back on Farxiga.  I called the patient at home and left a message for her to stop the metformin Glucophage at this point also. Elevated liver function test secondary to hypotension Chronic pain.  Patient does not believe that her issues were secondary to pain medications.  I did prescribe Narcan upon going home just in case.  The patient states that she takes a half a pill of her 20 mg rather than the whole pill.  I did not prescribe any pain medications for this patient going home. Lactic acid improved with IV fluids  DISCHARGE CONDITIONS:   Satisfactory  CONSULTS OBTAINED:   None  DRUG ALLERGIES:   Allergies  Allergen Reactions   Morphine Hives   Iodinated Diagnostic Agents Hives   Aspirin Hives    Noted on MD progress notes and discussed with MD 09/02/15   Etodolac Hives   Ibuprofen Hives   Shellfish Allergy Hives   Tylenol [Acetaminophen] Hives    Upset stomach     DISCHARGE MEDICATIONS:   Allergies as  of 06/07/2021       Reactions   Morphine Hives   Iodinated Diagnostic Agents Hives   Aspirin Hives   Noted on MD progress notes and discussed with MD 09/02/15   Etodolac Hives   Ibuprofen Hives   Shellfish Allergy Hives   Tylenol [acetaminophen] Hives   Upset stomach         Medication List     STOP taking these medications    ALPRAZolam 0.25 MG tablet Commonly known as: XANAX   baclofen 10 MG tablet Commonly known as: LIORESAL   busPIRone 7.5 MG tablet Commonly known as: BUSPAR    cloNIDine 0.1 MG tablet Commonly known as: CATAPRES   hydrOXYzine 25 MG tablet Commonly known as: ATARAX/VISTARIL   metoprolol succinate 100 MG 24 hr tablet Commonly known as: TOPROL-XL   mirtazapine 45 MG tablet Commonly known as: REMERON   nitroGLYCERIN 0.3 MG SL tablet Commonly known as: NITROSTAT   olmesartan-hydrochlorothiazide 20-12.5 MG tablet Commonly known as: BENICAR HCT   Onglyza 5 MG Tabs tablet Generic drug: saxagliptin HCl   oxybutynin 5 MG tablet Commonly known as: DITROPAN   phenazopyridine 100 MG tablet Commonly known as: PYRIDIUM   potassium chloride 10 MEQ tablet Commonly known as: KLOR-CON   Rybelsus 14 MG Tabs Generic drug: Semaglutide   tamsulosin 0.4 MG Caps capsule Commonly known as: FLOMAX   Tresiba FlexTouch 200 UNIT/ML FlexTouch Pen Generic drug: insulin degludec   Ubrelvy 100 MG Tabs Generic drug: Ubrogepant       TAKE these medications    albuterol 108 (90 Base) MCG/ACT inhaler Commonly known as: VENTOLIN HFA Inhale 2 puffs into the lungs every 4 (four) hours as needed for wheezing or shortness of breath.   albuterol (2.5 MG/3ML) 0.083% nebulizer solution Commonly known as: PROVENTIL Take 2.5 mg by nebulization every 6 (six) hours as needed for wheezing or shortness of breath.   atorvastatin 40 MG tablet Commonly known as: LIPITOR Take 40 mg by mouth daily.   Belsomra 15 MG Tabs Generic drug: Suvorexant Take 1 tablet by mouth at bedtime.   dapagliflozin propanediol 10 MG Tabs tablet Commonly known as: FARXIGA Take 5 mg by mouth daily.   esomeprazole 40 MG capsule Commonly known as: NEXIUM Take 40 mg by mouth 2 (two) times daily.   ferrous sulfate 325 (65 FE) MG tablet Take 1 tablet (325 mg total) by mouth 2 (two) times daily with a meal.   fexofenadine 180 MG tablet Commonly known as: ALLEGRA Take 180 mg by mouth daily.   fluticasone 50 MCG/ACT nasal spray Commonly known as: FLONASE Place 1 spray into both  nostrils 2 (two) times daily.   folic acid 1 MG tablet Commonly known as: FOLVITE Take 1 mg by mouth daily.   meclizine 25 MG tablet Commonly known as: ANTIVERT Take 1 tablet (25 mg total) by mouth 3 (three) times daily as needed for dizziness or nausea. What changed: when to take this   metFORMIN 500 MG 24 hr tablet Commonly known as: GLUCOPHAGE-XR Take 1,000 mg by mouth daily with breakfast.   naloxone 4 MG/0.1ML Liqd nasal spray kit Commonly known as: NARCAN One intranasal for altered mental status   Oxycodone HCl 20 MG Tabs Take 0.5 tablets (10 mg total) by mouth 4 (four) times daily. What changed: how much to take   OYSTER SHELL CALCIUM 500 + D PO Take 1 tablet by mouth daily.   zolpidem 10 MG tablet Commonly known as: AMBIEN Take 0.5 tablets (5  mg total) by mouth at bedtime as needed for sleep. What changed: how much to take       Called patient to tell her to hold off on the metformin Glucophage at this point also.  DISCHARGE INSTRUCTIONS:   Follow-up with Dr. Westley Gambles in 5 days.  If you experience worsening of your admission symptoms, develop shortness of breath, life threatening emergency, suicidal or homicidal thoughts you must seek medical attention immediately by calling 911 or calling your MD immediately  if symptoms less severe.  You Must read complete instructions/literature along with all the possible adverse reactions/side effects for all the Medicines you take and that have been prescribed to you. Take any new Medicines after you have completely understood and accept all the possible adverse reactions/side effects.   Please note  You were cared for by a hospitalist during your hospital stay. If you have any questions about your discharge medications or the care you received while you were in the hospital after you are discharged, you can call the unit and asked to speak with the hospitalist on call if the hospitalist that took care of you is not  available. Once you are discharged, your primary care physician will handle any further medical issues. Please note that NO REFILLS for any discharge medications will be authorized once you are discharged, as it is imperative that you return to your primary care physician (or establish a relationship with a primary care physician if you do not have one) for your aftercare needs so that they can reassess your need for medications and monitor your lab values.    Today   CHIEF COMPLAINT:   Chief Complaint  Patient presents with   Respiratory Arrest    Being bagged EMS    HISTORY OF PRESENT ILLNESS:  Heidemarie Goodnow  is a 60 y.o. female came in unresponsive in respiratory failure.   VITAL SIGNS:  Blood pressure 105/71, pulse 86, temperature 98.6 F (37 C), temperature source Oral, resp. rate 16, height $RemoveBe'5\' 7"'qnvNZwrDT$  (1.702 m), weight 123.4 kg, SpO2 98 %.  I/O:   Intake/Output Summary (Last 24 hours) at 06/07/2021 1730 Last data filed at 06/07/2021 0545 Gross per 24 hour  Intake --  Output 0 ml  Net 0 ml    PHYSICAL EXAMINATION:  GENERAL:  60 y.o.-year-old patient lying in the bed with no acute distress.  EYES: Pupils equal, round, reactive to light and accommodation. No scleral icterus. HEENT: Head atraumatic, normocephalic. Oropharynx and nasopharynx clear.  LUNGS: Normal breath sounds bilaterally, no wheezing, rales,rhonchi or crepitation. No use of accessory muscles of respiration.  CARDIOVASCULAR: S1, S2 normal. No murmurs, rubs, or gallops.  ABDOMEN: Soft, non-tender, non-distended. Bowel sounds present. No organomegaly or mass.  EXTREMITIES: No pedal edema.  NEUROLOGIC: Cranial nerves II through XII are intact. Muscle strength 5/5 in all extremities. Sensation intact. Gait not checked.  PSYCHIATRIC: The patient is alert and oriented x 3.  SKIN: No obvious rash, lesion, or ulcer.   DATA REVIEW:   CBC Recent Labs  Lab 06/06/21 0501  WBC 12.4*  HGB 12.7  HCT 39.6  PLT 297     Chemistries  Recent Labs  Lab 06/06/21 0501  NA 138  K 4.4  CL 112*  CO2 21*  GLUCOSE 111*  BUN 32*  CREATININE 1.64*  CALCIUM 8.5*  MG 1.9  AST 37  ALT 46*  ALKPHOS 101  BILITOT 0.7     Microbiology Results  Results for orders placed or performed during  the hospital encounter of 06/05/21  Resp Panel by RT-PCR (Flu A&B, Covid) Nasopharyngeal Swab     Status: None   Collection Time: 06/05/21  5:42 PM   Specimen: Nasopharyngeal Swab; Nasopharyngeal(NP) swabs in vial transport medium  Result Value Ref Range Status   SARS Coronavirus 2 by RT PCR NEGATIVE NEGATIVE Final    Comment: (NOTE) SARS-CoV-2 target nucleic acids are NOT DETECTED.  The SARS-CoV-2 RNA is generally detectable in upper respiratory specimens during the acute phase of infection. The lowest concentration of SARS-CoV-2 viral copies this assay can detect is 138 copies/mL. A negative result does not preclude SARS-Cov-2 infection and should not be used as the sole basis for treatment or other patient management decisions. A negative result may occur with  improper specimen collection/handling, submission of specimen other than nasopharyngeal swab, presence of viral mutation(s) within the areas targeted by this assay, and inadequate number of viral copies(<138 copies/mL). A negative result must be combined with clinical observations, patient history, and epidemiological information. The expected result is Negative.  Fact Sheet for Patients:  EntrepreneurPulse.com.au  Fact Sheet for Healthcare Providers:  IncredibleEmployment.be  This test is no t yet approved or cleared by the [place] FDA and  has been authorized for detection and/or diagnosis of SARS-CoV-2 by FDA under an Emergency Use Authorization (EUA). This EUA will remain  in effect (meaning this test can be used) for the duration of the COVID-19 declaration under Section 564(b)(1) of the Act,  21 U.S.C.section 360bbb-3(b)(1), unless the authorization is terminated  or revoked sooner.       Influenza A by PCR NEGATIVE NEGATIVE Final   Influenza B by PCR NEGATIVE NEGATIVE Final    Comment: (NOTE) The Xpert Xpress SARS-CoV-2/FLU/RSV plus assay is intended as an aid in the diagnosis of influenza from Nasopharyngeal swab specimens and should not be used as a sole basis for treatment. Nasal washings and aspirates are unacceptable for Xpert Xpress SARS-CoV-2/FLU/RSV testing.  Fact Sheet for Patients: EntrepreneurPulse.com.au  Fact Sheet for Healthcare Providers: IncredibleEmployment.be  This test is not yet approved or cleared by the [place] FDA and has been authorized for detection and/or diagnosis of SARS-CoV-2 by FDA under an Emergency Use Authorization (EUA). This EUA will remain in effect (meaning this test can be used) for the duration of the COVID-19 declaration under Section 564(b)(1) of the Act, 21 U.S.C. section 360bbb-3(b)(1), unless the authorization is terminated or revoked.  Performed at [facility], [address] [place], [place] [address]   Urine culture     Status: None   Collection Time: 06/05/21  5:42 PM   Specimen: Urine, Random  Result Value Ref Range Status   Specimen Description   Final    URINE, RANDOM Performed at [facility], [address] [place], [place] [address]    Special Requests   Final    NONE Performed at [facility], [address] [place], [place] [address]    Culture   Final    NO GROWTH Performed at [facility], [place] [address] [place], [place] [address]    Report Status 06/07/2021 FINAL  Final  Culture, blood (routine x 2)     Status: None (Preliminary result)   Collection Time: 06/05/21  5:42 PM   Specimen: BLOOD  Result Value Ref Range Status   Specimen Description BLOOD RIGHT HAND  Final   Special Requests   Final    BOTTLES DRAWN  AEROBIC AND ANAEROBIC Blood Culture adequate  volume   Culture   Final    NO GROWTH 2 DAYS Performed at [facility], [address] [place], [place] [address]    Report Status PENDING  Incomplete  Culture, blood (routine x 2)     Status: None (Preliminary result)   Collection Time: 06/05/21  5:42 PM   Specimen: BLOOD  Result Value Ref Range Status   Specimen Description BLOOD LEFT HAND  Final   Special Requests   Final    BOTTLES DRAWN AEROBIC AND ANAEROBIC Blood Culture results may not be optimal due to an excessive volume of blood received in culture bottles   Culture   Final    NO GROWTH 2 DAYS Performed at [facility], [address], [place], [place] [address]    Report Status PENDING  Incomplete  MRSA Next Gen by PCR, Nasal     Status: None   Collection Time: 06/05/21  9:04 PM   Specimen: Nasal Mucosa; Nasal Swab  Result Value Ref Range Status   MRSA by PCR Next Gen NOT DETECTED NOT DETECTED Final    Comment: (NOTE) The GeneXpert MRSA Assay (FDA approved for NASAL specimens only), is one component of a comprehensive MRSA colonization surveillance program. It is not intended to diagnose MRSA infection nor to guide or monitor treatment for MRSA infections. Test performance is not FDA approved in patients less than 5 years old. Performed at [facility], [address], [place], [place] [address]     RADIOLOGY:  CT Head Wo Contrast  Result Date: 06/05/2021 CLINICAL DATA:  Altered mental status. EXAM: CT HEAD WITHOUT CONTRAST TECHNIQUE: Contiguous axial images were obtained from the base of the skull through the vertex without intravenous contrast. COMPARISON:  January 23, 2017 FINDINGS: Brain: No evidence of acute large vascular territory infarction, hemorrhage, hydrocephalus, extra-axial collection or mass lesion/mass effect. Vascular: No hyperdense vessel. Atherosclerotic calcifications of the internal carotid arteries at the skull base.  Skull: Negative for fracture or focal lesion. Sinuses/Orbits: Ring fluid in the right maxillary sinus. Orbits are unremarkable. Mastoid air cells are predominantly clear. Other: Partially visualized endotracheal tube. IMPRESSION: No acute intracranial abnormality visualized. Electronically Signed   By: Dahlia Bailiff MD   On: 06/05/2021 18:44   US RENAL  Result Date: 06/06/2021 CLINICAL DATA:  Acute kidney injury. EXAM: RENAL / URINARY TRACT ULTRASOUND COMPLETE COMPARISON:  CT scan 05/04/2020 FINDINGS: Right Kidney: Renal measurements: 11.3 x 5.7 x 5.3 cm = volume: 178 mL. Echogenicity within normal limits. No mass or hydronephrosis visualized. Left Kidney: Renal measurements: 11.6 x 5.5 x 5.4 cm = volume: 179 mL. Echogenicity within normal limits. No mass or hydronephrosis visualized. Bladder: Decompressed Other: None. IMPRESSION: Unremarkable.  No evidence for hydronephrosis. Electronically Signed   By: Misty Stanley M.D.   On: 06/06/2021 08:24   DG Chest Portable 1 View  Result Date: 06/05/2021 CLINICAL DATA:  Endotracheal and nasogastric tube placement. EXAM: PORTABLE CHEST 1 VIEW COMPARISON:  12/23/2016 FINDINGS: Motion and patient body habitus degradation. Endotracheal tube terminates 2.0 cm above carina. Nasogastric tube terminates in the distal esophagus. Cardiomegaly accentuated by AP portable technique. No right-sided pleural effusion. Left pleural space not well evaluated secondary to overlying soft tissues. No pneumothorax. Interstitial prominence and indistinctness. No right-sided lobar consolidation. Inferior left lung poorly evaluated. IMPRESSION: Nasogastric tube terminating in the distal esophagus. This should be advanced 8-10 cm for optimal positioning. Endotracheal tube likely appropriately positioned on this reverse apical lordotic film. Otherwise, moderately limited exam secondary  to patient's size and mild motion. Cardiomegaly with probable interstitial edema. Suboptimal evaluation of  the inferior left hemithorax. These results will be called to the ordering clinician or representative by the Radiologist Assistant, and communication documented in the PACS or [employer]. Electronically Signed   By: Abigail Miyamoto M.D.   On: 06/05/2021 18:39      Management plans discussed with the patient, and she is agreement.  Tried to call the 2 numbers that the patient gave me but unable to reach family at either of those numbers  CODE STATUS:  Code Status History     Date Active Date Inactive Code Status Order ID Comments User Context   06/05/2021 1919 06/07/2021 1556 Full Code [id number]  Rust-[place]ster, Huel Cote, NP ED   05/04/2020 2005 05/08/2020 0151 Full Code [id number]  Harvie Bridge, DO ED   06/09/2017 0424 06/10/2017 1639 Full Code [id number]  Harrie Foreman, MD Inpatient   12/23/2016 1818 12/26/2016 1844 Full Code [id number]  Theodoro Grist, MD Inpatient   09/27/2016 1128 09/29/2016 1330 Full Code [id number]  Saundra Shelling, MD ED   08/31/2015 1748 09/02/2015 1952 Full Code [id number]  Nicholes Mango, MD ED      Questions for Most Recent Historical Code Status (Order [id number])        TOTAL TIME TAKING CARE OF THIS PATIENT: 35 minutes.    Loletha Grayer M.D on 06/07/2021 at 5:30 PM   [facility]  CC: Primary care physician; Jodi Marble, MD

## 2021-06-07 NOTE — TOC Transition Note (Signed)
Transition of Care [facility]) - CM/SW Discharge Note   Patient Details  Name: Lindsey [name] MRN: [id number] Date of Birth: [birth year]  [facility]) CM/SW Contact:  Candie Chroman, LCSW Phone Number: 06/07/2021, 10:43 AM   Clinical Narrative:  Patient has orders to discharge home today. Readmission prevention screen complete. CSW met with patient. No supports at bedside. CSW introduced role and explained that discharge planning would be discussed. PCP is Dr. Elijio Miles. Patient lives close to his office so she usually walks there. On days it is too hot, either she or her grandson drives. Pharmacy is [facility] on [address]. No issues obtaining medications. No home health prior to admission. Patient has a single-point cane and wheelchair (that was her husbands) at home. Patient requesting shower chair and blood pressure cuff. Patient unable to afford to pay for blood pressure cuff. Provided from TOC-donated items. Ordered 3-in-1 through [employer] to be shipped to her home. No further concerns. Patient's grandson is downstairs to pick her up. CSW signing off.   Final next level of care: Home/Self Care Barriers to Discharge: No Barriers Identified   Patient Goals and CMS Choice        Discharge Placement                Patient to be transferred to facility by: Grandson will take her home.   Patient and family notified of of transfer: 06/07/21  Discharge Plan and Services                DME Arranged: 3-N-1 DME Agency: [employer] Date DME Agency Contacted: 06/07/21   Representative spoke with at DME Agency: [place] ([place]) Interventions     Readmission Risk Interventions Readmission Risk Prevention Plan 06/07/2021  Transportation Screening Complete  PCP or Specialist Appt within 3-5 Days Complete  Social Work Consult for [place] Planning/Counseling [place] Not Applicable   Medication Review Press photographer) Complete  Some recent data might be hidden

## 2021-06-11 LAB — CULTURE, BLOOD (ROUTINE X 2)
Culture: NO GROWTH
Culture: NO GROWTH
Special Requests: ADEQUATE

## 2021-06-17 ENCOUNTER — Emergency Department: Payer: Medicare Other

## 2021-06-17 ENCOUNTER — Inpatient Hospital Stay
Admission: EM | Admit: 2021-06-17 | Discharge: 2021-06-22 | DRG: 177 | Disposition: A | Discharge status: Home / Self Care | Payer: Medicare Other | Attending: Internal Medicine | Admitting: Internal Medicine

## 2021-06-17 ENCOUNTER — Other Ambulatory Visit: Payer: Self-pay

## 2021-06-17 DIAGNOSIS — J449 Chronic obstructive pulmonary disease, unspecified: Secondary | ICD-10-CM | POA: Diagnosis not present

## 2021-06-17 DIAGNOSIS — I509 Heart failure, unspecified: Secondary | ICD-10-CM | POA: Diagnosis not present

## 2021-06-17 DIAGNOSIS — R7989 Other specified abnormal findings of blood chemistry: Secondary | ICD-10-CM

## 2021-06-17 DIAGNOSIS — Z8673 Personal history of transient ischemic attack (TIA), and cerebral infarction without residual deficits: Secondary | ICD-10-CM

## 2021-06-17 DIAGNOSIS — I110 Hypertensive heart disease with heart failure: Secondary | ICD-10-CM | POA: Diagnosis present

## 2021-06-17 DIAGNOSIS — R109 Unspecified abdominal pain: Secondary | ICD-10-CM

## 2021-06-17 DIAGNOSIS — Z96659 Presence of unspecified artificial knee joint: Secondary | ICD-10-CM | POA: Diagnosis present

## 2021-06-17 DIAGNOSIS — E785 Hyperlipidemia, unspecified: Secondary | ICD-10-CM | POA: Diagnosis not present

## 2021-06-17 DIAGNOSIS — K449 Diaphragmatic hernia without obstruction or gangrene: Secondary | ICD-10-CM | POA: Diagnosis not present

## 2021-06-17 DIAGNOSIS — R0600 Dyspnea, unspecified: Secondary | ICD-10-CM | POA: Diagnosis present

## 2021-06-17 DIAGNOSIS — U071 COVID-19: Secondary | ICD-10-CM | POA: Diagnosis not present

## 2021-06-17 DIAGNOSIS — Z8711 Personal history of peptic ulcer disease: Secondary | ICD-10-CM

## 2021-06-17 DIAGNOSIS — R001 Bradycardia, unspecified: Secondary | ICD-10-CM | POA: Diagnosis present

## 2021-06-17 DIAGNOSIS — Z79899 Other long term (current) drug therapy: Secondary | ICD-10-CM

## 2021-06-17 DIAGNOSIS — Z91013 Allergy to seafood: Secondary | ICD-10-CM

## 2021-06-17 DIAGNOSIS — K5900 Constipation, unspecified: Secondary | ICD-10-CM | POA: Diagnosis present

## 2021-06-17 DIAGNOSIS — K2100 Gastro-esophageal reflux disease with esophagitis, without bleeding: Secondary | ICD-10-CM | POA: Diagnosis not present

## 2021-06-17 DIAGNOSIS — F1320 Sedative, hypnotic or anxiolytic dependence, uncomplicated: Secondary | ICD-10-CM | POA: Diagnosis present

## 2021-06-17 DIAGNOSIS — Z885 Allergy status to narcotic agent status: Secondary | ICD-10-CM

## 2021-06-17 DIAGNOSIS — R0602 Shortness of breath: Secondary | ICD-10-CM

## 2021-06-17 DIAGNOSIS — E119 Type 2 diabetes mellitus without complications: Secondary | ICD-10-CM | POA: Diagnosis not present

## 2021-06-17 DIAGNOSIS — E1165 Type 2 diabetes mellitus with hyperglycemia: Secondary | ICD-10-CM

## 2021-06-17 DIAGNOSIS — K921 Melena: Secondary | ICD-10-CM | POA: Diagnosis not present

## 2021-06-17 DIAGNOSIS — I252 Old myocardial infarction: Secondary | ICD-10-CM

## 2021-06-17 DIAGNOSIS — D509 Iron deficiency anemia, unspecified: Secondary | ICD-10-CM | POA: Diagnosis not present

## 2021-06-17 DIAGNOSIS — I2699 Other pulmonary embolism without acute cor pulmonale: Secondary | ICD-10-CM | POA: Diagnosis not present

## 2021-06-17 DIAGNOSIS — I2510 Atherosclerotic heart disease of native coronary artery without angina pectoris: Secondary | ICD-10-CM | POA: Diagnosis present

## 2021-06-17 DIAGNOSIS — Z9884 Bariatric surgery status: Secondary | ICD-10-CM | POA: Diagnosis not present

## 2021-06-17 DIAGNOSIS — Z90710 Acquired absence of both cervix and uterus: Secondary | ICD-10-CM

## 2021-06-17 DIAGNOSIS — R0789 Other chest pain: Secondary | ICD-10-CM | POA: Diagnosis not present

## 2021-06-17 DIAGNOSIS — Z6839 Body mass index (BMI) 39.0-39.9, adult: Secondary | ICD-10-CM

## 2021-06-17 DIAGNOSIS — Z91041 Radiographic dye allergy status: Secondary | ICD-10-CM

## 2021-06-17 DIAGNOSIS — E669 Obesity, unspecified: Secondary | ICD-10-CM | POA: Diagnosis present

## 2021-06-17 DIAGNOSIS — Z7984 Long term (current) use of oral hypoglycemic drugs: Secondary | ICD-10-CM

## 2021-06-17 DIAGNOSIS — R059 Cough, unspecified: Secondary | ICD-10-CM | POA: Diagnosis not present

## 2021-06-17 DIAGNOSIS — F419 Anxiety disorder, unspecified: Secondary | ICD-10-CM | POA: Diagnosis present

## 2021-06-17 DIAGNOSIS — R079 Chest pain, unspecified: Secondary | ICD-10-CM | POA: Diagnosis not present

## 2021-06-17 DIAGNOSIS — G8929 Other chronic pain: Secondary | ICD-10-CM | POA: Diagnosis present

## 2021-06-17 DIAGNOSIS — I517 Cardiomegaly: Secondary | ICD-10-CM | POA: Diagnosis not present

## 2021-06-17 DIAGNOSIS — Z981 Arthrodesis status: Secondary | ICD-10-CM | POA: Diagnosis not present

## 2021-06-17 DIAGNOSIS — Z9889 Other specified postprocedural states: Secondary | ICD-10-CM | POA: Diagnosis not present

## 2021-06-17 DIAGNOSIS — Z8249 Family history of ischemic heart disease and other diseases of the circulatory system: Secondary | ICD-10-CM

## 2021-06-17 DIAGNOSIS — Z825 Family history of asthma and other chronic lower respiratory diseases: Secondary | ICD-10-CM

## 2021-06-17 DIAGNOSIS — D3500 Benign neoplasm of unspecified adrenal gland: Secondary | ICD-10-CM | POA: Diagnosis not present

## 2021-06-17 DIAGNOSIS — Z886 Allergy status to analgesic agent status: Secondary | ICD-10-CM

## 2021-06-17 DIAGNOSIS — D62 Acute posthemorrhagic anemia: Secondary | ICD-10-CM | POA: Diagnosis present

## 2021-06-17 LAB — CBC
HCT: 40.7 % (ref 36.0–46.0)
Hemoglobin: 13 g/dL (ref 12.0–15.0)
MCH: 28.4 pg (ref 26.0–34.0)
MCHC: 31.9 g/dL (ref 30.0–36.0)
MCV: 89.1 fL (ref 80.0–100.0)
Platelets: 384 10*3/uL (ref 150–400)
RBC: 4.57 MIL/uL (ref 3.87–5.11)
RDW: 14 % (ref 11.5–15.5)
WBC: 8.2 10*3/uL (ref 4.0–10.5)
nRBC: 0 % (ref 0.0–0.2)

## 2021-06-17 LAB — BASIC METABOLIC PANEL
Anion gap: 4 — ABNORMAL LOW (ref 5–15)
BUN: 25 mg/dL — ABNORMAL HIGH (ref 8–23)
CO2: 23 mmol/L (ref 22–32)
Calcium: 8.6 mg/dL — ABNORMAL LOW (ref 8.9–10.3)
Chloride: 113 mmol/L — ABNORMAL HIGH (ref 98–111)
Creatinine, Ser: 1.17 mg/dL — ABNORMAL HIGH (ref 0.44–1.00)
GFR, Estimated: 53 mL/min — ABNORMAL LOW (ref >60)
Glucose, Bld: 178 mg/dL — ABNORMAL HIGH (ref 70–99)
Potassium: 4.6 mmol/L (ref 3.5–5.1)
Sodium: 140 mmol/L (ref 135–145)

## 2021-06-17 LAB — RESP PANEL BY RT-PCR (FLU A&B, COVID) ARPGX2
Influenza A by PCR: NEGATIVE
Influenza B by PCR: NEGATIVE
SARS Coronavirus 2 by RT PCR: POSITIVE — AB

## 2021-06-17 LAB — TROPONIN I (HIGH SENSITIVITY)
Troponin I (High Sensitivity): 3 ng/L (ref <18)
Troponin I (High Sensitivity): 3 ng/L (ref <18)

## 2021-06-17 LAB — D-DIMER, QUANTITATIVE: D-Dimer, Quant: 0.99 ug/mL-FEU — ABNORMAL HIGH (ref 0.00–0.50)

## 2021-06-17 MED ORDER — IPRATROPIUM-ALBUTEROL 0.5-2.5 (3) MG/3ML IN SOLN: 3.0000 mL | RESPIRATORY_TRACT | Status: DC | PRN

## 2021-06-17 MED ORDER — ONDANSETRON HCL 4 MG/2ML IJ SOLN: 4.0000 mg | Freq: Four times a day (QID) | INTRAMUSCULAR | Status: DC | PRN

## 2021-06-17 MED ORDER — SODIUM CHLORIDE 0.9 % IV SOLN
100.0000 mg | Freq: Every day | INTRAVENOUS | Status: AC
  Administered 2021-06-18 – 2021-06-20 (×3): 100 mg via INTRAVENOUS
  Filled 2021-06-17 (×3): qty 20
  Filled 2021-06-17: qty 100

## 2021-06-17 MED ORDER — FENTANYL CITRATE (PF) 100 MCG/2ML IJ SOLN
50.0000 ug | Freq: Once | INTRAMUSCULAR | Status: AC
Start: 2021-06-17 — End: 2021-06-17
  Administered 2021-06-17: 50 ug via INTRAVENOUS
  Filled 2021-06-17: qty 2

## 2021-06-17 MED ORDER — SODIUM CHLORIDE 0.9 % IV BOLUS
1000.0000 mL | Freq: Once | INTRAVENOUS | Status: AC
  Administered 2021-06-17: 1000 mL via INTRAVENOUS

## 2021-06-17 MED ORDER — SODIUM CHLORIDE 0.9 % IV SOLN
200.0000 mg | Freq: Once | INTRAVENOUS | Status: AC
  Administered 2021-06-18: 200 mg via INTRAVENOUS
  Filled 2021-06-17: qty 200

## 2021-06-17 MED ORDER — POLYETHYLENE GLYCOL 3350 17 G PO PACK: 17.0000 g | PACK | Freq: Every day | ORAL | Status: DC | PRN

## 2021-06-17 MED ORDER — ENOXAPARIN SODIUM 60 MG/0.6ML IJ SOSY: 0.5000 mg/kg | PREFILLED_SYRINGE | INTRAMUSCULAR | Status: DC

## 2021-06-17 MED ORDER — PANTOPRAZOLE INFUSION (NEW) - SIMPLE MED
8.0000 mg/h | INTRAVENOUS | Status: DC
  Administered 2021-06-17 – 2021-06-18 (×4): 8 mg/h via INTRAVENOUS
  Filled 2021-06-17: qty 100
  Filled 2021-06-17 (×3): qty 80

## 2021-06-17 MED ORDER — PANTOPRAZOLE 80MG IVPB - SIMPLE MED
80.0000 mg | Freq: Once | INTRAVENOUS | Status: AC
  Administered 2021-06-17: 80 mg via INTRAVENOUS
  Filled 2021-06-17: qty 80

## 2021-06-17 MED ORDER — IPRATROPIUM-ALBUTEROL 0.5-2.5 (3) MG/3ML IN SOLN
3.0000 mL | Freq: Four times a day (QID) | RESPIRATORY_TRACT | Status: DC
  Administered 2021-06-17: 3 mL via RESPIRATORY_TRACT
  Filled 2021-06-17: qty 3

## 2021-06-17 MED ORDER — IPRATROPIUM-ALBUTEROL 20-100 MCG/ACT IN AERS
1.0000 | INHALATION_SPRAY | RESPIRATORY_TRACT | Status: DC | PRN
  Filled 2021-06-17: qty 4

## 2021-06-17 MED ORDER — TRAMADOL HCL 50 MG PO TABS
50.0000 mg | ORAL_TABLET | Freq: Four times a day (QID) | ORAL | Status: DC | PRN
  Administered 2021-06-18 – 2021-06-19 (×3): 50 mg via ORAL
  Filled 2021-06-17 (×3): qty 1

## 2021-06-17 MED ORDER — ONDANSETRON HCL 4 MG PO TABS: 4.0000 mg | ORAL_TABLET | Freq: Four times a day (QID) | ORAL | Status: DC | PRN

## 2021-06-17 MED ORDER — IPRATROPIUM-ALBUTEROL 20-100 MCG/ACT IN AERS
1.0000 | INHALATION_SPRAY | Freq: Four times a day (QID) | RESPIRATORY_TRACT | Status: DC
  Administered 2021-06-18 (×3): 1 via RESPIRATORY_TRACT
  Filled 2021-06-17: qty 4

## 2021-06-17 MED ORDER — HALOPERIDOL LACTATE 5 MG/ML IJ SOLN
5.0000 mg | Freq: Once | INTRAMUSCULAR | Status: AC
  Administered 2021-06-17: 5 mg via INTRAVENOUS
  Filled 2021-06-17: qty 1

## 2021-06-17 MED ORDER — PANTOPRAZOLE SODIUM 40 MG IV SOLR: 40.0000 mg | Freq: Two times a day (BID) | INTRAVENOUS | Status: DC

## 2021-06-17 NOTE — ED Provider Notes (Signed)
[facility] Emergency Department Provider Note   ____________________________________________   I have reviewed the triage vital signs and the nursing notes.   HISTORY  Chief Complaint   History limited by: Not Limited   HPI Lindsey [name] is a 60 y.o. female who presents to the emergency department today with complaints of chest pain, and secondary complaint of abdominal pain. The patient states that she has been having problems with chest pain since a recent admission. She says that it has been accompanied by shortness of breath. The patient is also concerned that she might have issues with her gastric bypass. She states that for the past three days she has been having significant abdominal pain. She has also noticed some blood in her stool as well. The patient feels like she has had fevers and chills.     Records reviewed. Per medical record review patient has a history of recent admission for respiratory issues.   Past Medical History:  Diagnosis Date   Allergy    Anemia    Arthritis    knees   COPD (chronic obstructive pulmonary disease) ([place])    Diabetes mellitus without complication (HCC)    diet controlled   GERD (gastroesophageal reflux disease)    Heart attack ([place]) 2002   Hyperlipidemia    Hypertension    PUD (peptic ulcer disease)    Sleep apnea    can't tolerate CPAP.  Uses O2 only   Spinal cord stimulator status    for lower back pain   Stroke [facility]) 2004   "mini-stroke"   Vertigo    Wears dentures    full upper and lower    Patient Active Problem List   Diagnosis Date Noted   Elevated LFTs    Other chronic pain    Acute respiratory failure with hypoxia and hypercapnia ([place]) 06/06/2021   Opioid overdose ([place]) 06/06/2021   Poorly controlled type 2 diabetes mellitus ([place]) 06/06/2021   Transaminitis 06/06/2021   Lactic acidosis 06/06/2021   Chronic obstructive pulmonary disease, unspecified COPD type ([place]) 06/05/2021   Acute  lower UTI 05/08/2020   Upper GI bleed 05/04/2020   Acute kidney injury superimposed on chronic kidney disease ([place]) 06/09/2017   Symptomatic anemia 12/24/2016   Chest pain 12/23/2016   Dyspnea 12/23/2016   Palpitations 12/23/2016   Hypotension 09/29/2016   Renal insufficiency 09/29/2016   Anemia 09/29/2016   Leukocytosis 09/29/2016   Prediabetes 09/29/2016   Jejunal inflammation 09/29/2016   Abnormal findings on esophagogastroduodenoscopy (EGD) 09/29/2016   Vitamin D deficiency 09/29/2016   Syncope 09/27/2016   Medication overuse headache 11/08/2015   Morbid obesity with BMI of 45.0-49.9, adult ([place]) 11/08/2015   Seizures ([place]) 09/02/2015   Lipodystrophy 65/78/4696   Complications due to nervous system device, implant, and graft 04/18/2013   Disease of female genital organs 04/01/2013   Difficult or painful urination 03/18/2013   Urge incontinence 03/18/2013   Urgency of micturation 03/18/2013    Past Surgical History:  Procedure Laterality Date   ABDOMINAL HYSTERECTOMY     back surgery     broken wrist     COLONOSCOPY WITH PROPOFOL N/A 01/30/2017   Procedure: COLONOSCOPY WITH PROPOFOL;  Surgeon: Jonathon Bellows, MD;  Location: [facility] ENDOSCOPY;  Service: Endoscopy;  Laterality: N/A;   COLONOSCOPY WITH PROPOFOL N/A 02/20/2017   Procedure: Colonoscopy with propofol ;  Surgeon: Jonathon Bellows, MD;  Location: [facility] ENDOSCOPY;  Service: Endoscopy;  Laterality: N/A;   COLONOSCOPY WITH PROPOFOL N/A 03/13/2017   Procedure:  COLONOSCOPY WITH PROPOFOL;  Surgeon: Jonathon Bellows, MD;  Location: [facility] ENDOSCOPY;  Service: Endoscopy;  Laterality: N/A;   COLONOSCOPY WITH PROPOFOL N/A 04/17/2017   Procedure: COLONOSCOPY WITH PROPOFOL;  Surgeon: Jonathon Bellows, MD;  Location: [facility] ENDOSCOPY;  Service: Endoscopy;  Laterality: N/A;   ESOPHAGOGASTRODUODENOSCOPY N/A 05/05/2020   Procedure: ESOPHAGOGASTRODUODENOSCOPY (EGD);  Surgeon: Toledo, Benay Pike, MD;  Location: [facility] ENDOSCOPY;  Service: Gastroenterology;  Laterality:  N/A;   ESOPHAGOGASTRODUODENOSCOPY (EGD) WITH PROPOFOL N/A 09/28/2016   Procedure: ESOPHAGOGASTRODUODENOSCOPY (EGD) WITH PROPOFOL;  Surgeon: Manya Silvas, MD;  Location: [facility] ENDOSCOPY;  Service: Endoscopy;  Laterality: N/A;   ESOPHAGOGASTRODUODENOSCOPY (EGD) WITH PROPOFOL N/A 03/13/2017   Procedure: ESOPHAGOGASTRODUODENOSCOPY (EGD) WITH PROPOFOL;  Surgeon: Jonathon Bellows, MD;  Location: [facility] ENDOSCOPY;  Service: Endoscopy;  Laterality: N/A;   GALLBLADDER SURGERY     GASTRIC BYPASS     GIVENS CAPSULE STUDY N/A 03/13/2017   Procedure: GIVENS CAPSULE STUDY;  Surgeon: Jonathon Bellows, MD;  Location: [facility] ENDOSCOPY;  Service: Endoscopy;  Laterality: N/A;   HERNIA REPAIR     JOINT REPLACEMENT  1995   REPLACEMENT TOTAL KNEE     ROTATOR CUFF REPAIR      Prior to Admission medications   Medication Sig Start Date End Date Taking? Authorizing Provider  albuterol (PROVENTIL HFA;VENTOLIN HFA) 108 (90 BASE) MCG/ACT inhaler Inhale 2 puffs into the lungs every 4 (four) hours as needed for wheezing or shortness of breath.    [provider]  albuterol (PROVENTIL) (2.5 MG/3ML) 0.083% nebulizer solution Take 2.5 mg by nebulization every 6 (six) hours as needed for wheezing or shortness of breath.    [provider]  atorvastatin (LIPITOR) 40 MG tablet Take 40 mg by mouth daily. 03/18/21   [provider]  Calcium Carbonate-Vitamin D (OYSTER SHELL CALCIUM 500 + D PO) Take 1 tablet by mouth daily. 12/26/16   [provider]  dapagliflozin propanediol (FARXIGA) 10 MG TABS tablet Take 5 mg by mouth daily.    [provider]  esomeprazole (NEXIUM) 40 MG capsule Take 40 mg by mouth 2 (two) times daily.    [provider]  ferrous sulfate 325 (65 FE) MG tablet Take 1 tablet (325 mg total) by mouth 2 (two) times daily with a meal. 12/26/16   Sudini, Srikar, MD  fexofenadine (ALLEGRA) 180 MG tablet Take 180 mg by mouth daily.  01/25/17   [provider]  fluticasone  (FLONASE) 50 MCG/ACT nasal spray Place 1 spray into both nostrils 2 (two) times daily.    [provider]  folic acid (FOLVITE) 1 MG tablet Take 1 mg by mouth daily.    [provider]  meclizine (ANTIVERT) 25 MG tablet Take 1 tablet (25 mg total) by mouth 3 (three) times daily as needed for dizziness or nausea. 01/23/17   Earleen Newport, MD  metFORMIN (GLUCOPHAGE-XR) 500 MG 24 hr tablet Take 1,000 mg by mouth daily with breakfast.    [provider]  naloxone [facility]) nasal spray 4 mg/0.1 mL One intranasal for altered mental status 06/07/21   Loletha Grayer, MD  Oxycodone HCl 20 MG TABS Take 0.5 tablets (10 mg total) by mouth 4 (four) times daily. 06/07/21   Wieting, Richard, MD  Suvorexant (BELSOMRA) 15 MG TABS Take 1 tablet by mouth at bedtime.    [provider]  zolpidem (AMBIEN) 10 MG tablet Take 0.5 tablets (5 mg total) by mouth at bedtime as needed for sleep. 06/07/21   Loletha Grayer, MD  Allergies Morphine, Iodinated diagnostic agents, Aspirin, Etodolac, Ibuprofen, Shellfish allergy, and Tylenol [acetaminophen]  Family History  Problem Relation Age of Onset   Hypertension Father    COPD Father    Heart disease Father    Breast cancer Neg Hx     Social History Social History   Tobacco Use   Smoking status: Never   Smokeless tobacco: Never  Vaping Use   Vaping Use: Never used  Substance Use Topics   Alcohol use: No    Alcohol/week: 0.0 standard drinks   Drug use: No    Review of Systems Constitutional: Positive for fever/chills Eyes: No visual changes. ENT: No sore throat. Cardiovascular: Positive for chest pain. Respiratory: Positive for shortness of breath. Gastrointestinal: Positive for abdominal pain. Genitourinary: Negative for dysuria. Musculoskeletal: Negative for back pain. Skin: Negative for rash. Neurological: Negative for headaches, focal weakness or  numbness.  ____________________________________________   PHYSICAL EXAM:  VITAL SIGNS: ED Triage Vitals  Enc Vitals Group     BP 06/17/21 1219 132/89     Pulse Rate 06/17/21 1219 78     Resp 06/17/21 1219 16     Temp 06/17/21 1219 98.6 F (37 C)     Temp Source 06/17/21 1219 Oral     SpO2 06/17/21 1219 97 %     Weight 06/17/21 1220 250 lb (113.4 kg)     Height 06/17/21 1220 5\' 7"  (1.702 m)     Head Circumference --      Peak Flow --      Pain Score 06/17/21 1220 7   Constitutional: Alert and oriented.  Eyes: Conjunctivae are normal.  ENT      Head: Normocephalic and atraumatic.      Nose: No congestion/rhinnorhea.      Mouth/Throat: Mucous membranes are moist.      Neck: No stridor. Hematological/Lymphatic/Immunilogical: No cervical lymphadenopathy. Cardiovascular: Normal rate, regular rhythm.  No murmurs, rubs, or gallops.  Respiratory: Normal respiratory effort without tachypnea nor retractions. Breath sounds are clear and equal bilaterally. No wheezes/rales/rhonchi. Gastrointestinal: Soft and tender in the left upper quadrant.  Genitourinary: Deferred Musculoskeletal: Normal range of motion in all extremities. No lower extremity edema. Neurologic:  Normal speech and language. No gross focal neurologic deficits are appreciated.  Skin:  Skin is warm, dry and intact. No rash noted. Psychiatric: Mood and affect are normal. Speech and behavior are normal. Patient exhibits appropriate insight and judgment.  ____________________________________________    LABS (pertinent positives/negatives)  Trop hs 3 CBC wbc 8.2, hgb 13.0, plt 384 BMP na 140, k 4.6, cl 113, glu 178, cr 1.17 COVID positive D-dimer 0.99 ____________________________________________   EKG  I, Nance Pear, attending physician, personally viewed and interpreted this EKG  EKG Time: 1224 Rate: 76 Rhythm: normal sinus rhythm Axis: normal Intervals: qtc 407 QRS: narrow ST changes: no st  elevation, t wave inversion v1, v2 Impression: abnormal ekg ____________________________________________    RADIOLOGY  CXR Mild cardiomegaly without failure  CT abd/pel No acute abnormality ____________________________________________   PROCEDURES  Procedures  ____________________________________________   INITIAL IMPRESSION / ASSESSMENT AND PLAN / ED COURSE  Pertinent labs & imaging results that were available during my care of the patient were reviewed by me and considered in my medical decision making (see chart for details).   Patient presented to the emergency department because of concern for SOB and secondary complaint of abdominal pain. In terms of the shortness of breath did have initial concern for possible PE given recent hospitalization. Patient  has iodine allergy so V/Q scan was ordered. However while awaiting V/Q scan covid did come back positive and could certainly explain the breathing difficulty. In terms of the abdominal pain ct was obtained and did not show any acute abnormality. Patient states she has had ulcer type diseases in the past with bleeding, and do have concern that this could be occurring again. Because of this protonix was ordered. Will plan on admission to the hospitalist service.  ____________________________________________   FINAL CLINICAL IMPRESSION(S) / ED DIAGNOSES  Final diagnoses:  COVID-19  Abdominal pain, unspecified abdominal location     Note: This dictation was prepared with Dragon dictation. Any transcriptional errors that result from this process are unintentional     Nance Pear, MD 06/17/21 [phone]

## 2021-06-17 NOTE — ED Notes (Signed)
Attempted X 2 to place PIV for fluids without success. IV team consult placed.

## 2021-06-17 NOTE — ED Triage Notes (Signed)
Pt reports she was hospitalized for cardiac issues 2 weeks ago, and has c/o CP, SOB, dry cough since her D/C. Pt also reports dark red blood in slool x3 days. Pt denies N/V/D. Pt took 2 covid tests at home and they were negative.

## 2021-06-17 NOTE — ED Notes (Signed)
EDP notified of positive covid test

## 2021-06-17 NOTE — Progress Notes (Addendum)
Remdesivir - Pharmacy Brief Note   O:  ALT: 46 - from 06/06/2021 CXR: Mild cardiomegaly without failure SpO2: 95% on RA   A/P:  Remdesivir 200 mg IVPB once followed by 100 mg IVPB daily x 4 days.   **Consulted again 7/23 to adjust or total of 3 days of remdesivir - orders updated**  Sherilyn Banker, PharmD 06/17/2021 10:32 PM

## 2021-06-17 NOTE — ED Notes (Signed)
Attempted blood draw/IV x2 without success. Pt states she has a history of difficult sticks.

## 2021-06-17 NOTE — ED Notes (Signed)
Pt transported to CT via stretcher at this time.  

## 2021-06-17 NOTE — H&P (Addendum)
History and Physical    Lindsey [name] ZDG:[id number] DOB: Sep 04, 1961 DOA: 06/17/2021  PCP: Jodi Marble, MD  Chief Complaint: Chest pain, shortness of breath, cough, epigastric abdominal pain  HPI: Lindsey [name] is a 60 y.o. female with a past medical history of hypertension, hyperlipidemia, insulin-dependent diabetes mellitus, GERD, peptic ulcer disease, history of CVA, history of coronary artery disease with MI in 2002, history of gastric bypass surgery.  The patient presents to the emergency department with several complaints including chest pain, epigastric abdominal pain, shortness of breath and cough.  She states this has been going on since she was discharged from the hospital on 7/12.  She states that she is fully vaccinated and has got the booster shot. She also reports 3 days of melena.  Denies any fevers or chills.  Denies any nausea or vomiting or hematemesis.  In the emergency department she has tested positive for COVID-19.    ED Course: COVID-19 PCR positive, chest x-ray shows mild cardiomegaly.  CT abdomen pelvis without contrast no acute findings.  Lab work obtained as well.  Review of Systems: 14 point review of systems is negative except for what is mentioned above in the HPI.   Past Medical History:  Diagnosis Date   Allergy    Anemia    Arthritis    knees   COPD (chronic obstructive pulmonary disease) ([place])    Diabetes mellitus without complication (HCC)    diet controlled   GERD (gastroesophageal reflux disease)    Heart attack ([place]) 2002   Hyperlipidemia    Hypertension    PUD (peptic ulcer disease)    Sleep apnea    can't tolerate CPAP.  Uses O2 only   Spinal cord stimulator status    for lower back pain   Stroke [facility]) 2004   "mini-stroke"   Vertigo    Wears dentures    full upper and lower    Past Surgical History:  Procedure Laterality Date   ABDOMINAL HYSTERECTOMY     back surgery     broken wrist     COLONOSCOPY WITH PROPOFOL N/A  01/30/2017   Procedure: COLONOSCOPY WITH PROPOFOL;  Surgeon: Jonathon Bellows, MD;  Location: [facility] ENDOSCOPY;  Service: Endoscopy;  Laterality: N/A;   COLONOSCOPY WITH PROPOFOL N/A 02/20/2017   Procedure: Colonoscopy with propofol ;  Surgeon: Jonathon Bellows, MD;  Location: [facility] ENDOSCOPY;  Service: Endoscopy;  Laterality: N/A;   COLONOSCOPY WITH PROPOFOL N/A 03/13/2017   Procedure: COLONOSCOPY WITH PROPOFOL;  Surgeon: Jonathon Bellows, MD;  Location: [facility] ENDOSCOPY;  Service: Endoscopy;  Laterality: N/A;   COLONOSCOPY WITH PROPOFOL N/A 04/17/2017   Procedure: COLONOSCOPY WITH PROPOFOL;  Surgeon: Jonathon Bellows, MD;  Location: [facility] ENDOSCOPY;  Service: Endoscopy;  Laterality: N/A;   ESOPHAGOGASTRODUODENOSCOPY N/A 05/05/2020   Procedure: ESOPHAGOGASTRODUODENOSCOPY (EGD);  Surgeon: Toledo, Benay Pike, MD;  Location: [facility] ENDOSCOPY;  Service: Gastroenterology;  Laterality: N/A;   ESOPHAGOGASTRODUODENOSCOPY (EGD) WITH PROPOFOL N/A 09/28/2016   Procedure: ESOPHAGOGASTRODUODENOSCOPY (EGD) WITH PROPOFOL;  Surgeon: Manya Silvas, MD;  Location: [facility] ENDOSCOPY;  Service: Endoscopy;  Laterality: N/A;   ESOPHAGOGASTRODUODENOSCOPY (EGD) WITH PROPOFOL N/A 03/13/2017   Procedure: ESOPHAGOGASTRODUODENOSCOPY (EGD) WITH PROPOFOL;  Surgeon: Jonathon Bellows, MD;  Location: [facility] ENDOSCOPY;  Service: Endoscopy;  Laterality: N/A;   GALLBLADDER SURGERY     GASTRIC BYPASS     GIVENS CAPSULE STUDY N/A 03/13/2017   Procedure: GIVENS CAPSULE STUDY;  Surgeon: Jonathon Bellows, MD;  Location: [facility] ENDOSCOPY;  Service: Endoscopy;  Laterality: N/A;   HERNIA REPAIR  JOINT REPLACEMENT  1995   REPLACEMENT TOTAL KNEE     ROTATOR CUFF REPAIR      Social History   Socioeconomic History   Marital status: Widowed    Spouse name: Not on file   Number of children: Not on file   Years of education: Not on file   Highest education level: Not on file  Occupational History   Occupation: disabled  Tobacco Use   Smoking status: Never   Smokeless tobacco: Never   Vaping Use   Vaping Use: Never used  Substance and Sexual Activity   Alcohol use: No    Alcohol/week: 0.0 standard drinks   Drug use: No   Sexual activity: Yes  Other Topics Concern   Not on file  Social History Narrative   Not on file   Social Determinants of Health   Financial Resource Strain: Not on file  Food Insecurity: Not on file  Transportation Needs: Not on file  Physical Activity: Not on file  Stress: Not on file  Social Connections: Not on file  Intimate Partner Violence: Not on file    Allergies  Allergen Reactions   Morphine Hives   Iodinated Diagnostic Agents Hives   Aspirin Hives    Noted on MD progress notes and discussed with MD 09/02/15   Etodolac Hives   Ibuprofen Hives   Shellfish Allergy Hives   Tylenol [Acetaminophen] Hives    Upset stomach     Family History  Problem Relation Age of Onset   Hypertension Father    COPD Father    Heart disease Father    Breast cancer Neg Hx     Prior to Admission medications   Medication Sig Start Date End Date Taking? Authorizing Provider  albuterol (PROVENTIL HFA;VENTOLIN HFA) 108 (90 BASE) MCG/ACT inhaler Inhale 2 puffs into the lungs every 4 (four) hours as needed for wheezing or shortness of breath.    [provider]  albuterol (PROVENTIL) (2.5 MG/3ML) 0.083% nebulizer solution Take 2.5 mg by nebulization every 6 (six) hours as needed for wheezing or shortness of breath.    [provider]  atorvastatin (LIPITOR) 40 MG tablet Take 40 mg by mouth daily. 03/18/21   [provider]  Calcium Carbonate-Vitamin D (OYSTER SHELL CALCIUM 500 + D PO) Take 1 tablet by mouth daily. 12/26/16   [provider]  dapagliflozin propanediol (FARXIGA) 10 MG TABS tablet Take 5 mg by mouth daily.    [provider]  esomeprazole (NEXIUM) 40 MG capsule Take 40 mg by mouth 2 (two) times daily.    [provider]  ferrous sulfate 325 (65 FE) MG tablet Take 1 tablet (325 mg  total) by mouth 2 (two) times daily with a meal. 12/26/16   Sudini, Srikar, MD  fexofenadine (ALLEGRA) 180 MG tablet Take 180 mg by mouth daily.  01/25/17   [provider]  fluticasone (FLONASE) 50 MCG/ACT nasal spray Place 1 spray into both nostrils 2 (two) times daily.    [provider]  folic acid (FOLVITE) 1 MG tablet Take 1 mg by mouth daily.    [provider]  meclizine (ANTIVERT) 25 MG tablet Take 1 tablet (25 mg total) by mouth 3 (three) times daily as needed for dizziness or nausea. 01/23/17   Earleen Newport, MD  metFORMIN (GLUCOPHAGE-XR) 500 MG 24 hr tablet Take 1,000 mg by mouth daily with breakfast.    [provider]  naloxone (NARCAN) nasal spray 4 mg/0.1 mL One intranasal  for altered mental status 06/07/21   Loletha Grayer, MD  Oxycodone HCl 20 MG TABS Take 0.5 tablets (10 mg total) by mouth 4 (four) times daily. 06/07/21   Wieting, Richard, MD  Suvorexant (BELSOMRA) 15 MG TABS Take 1 tablet by mouth at bedtime.    [provider]  zolpidem (AMBIEN) 10 MG tablet Take 0.5 tablets (5 mg total) by mouth at bedtime as needed for sleep. 06/07/21   Loletha Grayer, MD    Physical Exam: Vitals:   06/17/21 2100 06/17/21 2130 06/17/21 2200 06/17/21 2230  BP: (!) 141/88 139/77 110/70 (!) 121/101  Pulse: (!) 53 (!) 48 (!) 53 (!) 54  Resp: 12 11 13 15   Temp:      TempSrc:      SpO2: 95% 96% 94% 100%  Weight:      Height:         General:  Appears calm and comfortable and is in NAD Cardiovascular:  RRR, no m/r/g.  Respiratory:   CTA bilaterally with no wheezes/rales/rhonchi.  Normal respiratory effort. Abdomen:  soft, ND, NABS, moderate epigastric tenderness to palpation Skin:  no rash or induration seen on limited exam Musculoskeletal:  grossly normal tone BUE/BLE, good ROM, no bony abnormality Lower extremity:  No LE edema.  Limited foot exam with no ulcerations.  2+ distal pulses. Psychiatric:  grossly normal mood and affect,  speech fluent and appropriate, AOx3 Neurologic:  CN 2-12 grossly intact, moves all extremities in coordinated fashion, sensation intact    Radiological Exams on Admission: Independently reviewed - see discussion in A/P where applicable  CT ABDOMEN PELVIS WO CONTRAST  Result Date: 06/17/2021 CLINICAL DATA:  60 year old female with abdominal pain. EXAM: CT ABDOMEN AND PELVIS WITHOUT CONTRAST TECHNIQUE: Multidetector CT imaging of the abdomen and pelvis was performed following the standard protocol without IV contrast. COMPARISON:  CT abdomen pelvis dated 05/04/2020. FINDINGS: Evaluation of this exam is limited in the absence of intravenous contrast. Lower chest: The visualized lung bases are clear. No intra-abdominal free air or free fluid. Hepatobiliary: The liver is unremarkable. No intrahepatic biliary ductal dilatation. Cholecystectomy. Pancreas: Unremarkable. No pancreatic ductal dilatation or surrounding inflammatory changes. Spleen: Normal in size without focal abnormality. Adrenals/Urinary Tract: There is a 14 mm left adrenal adenoma. Indeterminate right adrenal nodule measures 24 mm. There is no hydronephrosis or nephrolithiasis on either side. The visualized ureters and urinary bladder appear unremarkable. Stomach/Bowel: Postsurgical changes of gastric bypass. There is a small hiatal hernia containing the anastomotic suture similar to prior CT. There is no bowel obstruction. The appendix is not visualized with certainty. No inflammatory changes identified in the right lower quadrant. Vascular/Lymphatic: The abdominal aorta and IVC are unremarkable. No portal venous gas. There is no adenopathy. Reproductive: Hysterectomy. No adnexal masses. Other: Ventral hernia repair mesh. Musculoskeletal: Osteopenia with degenerative changes of the spine. L5-S1 posterior fusion. No acute osseous pathology. Spinal stimulator with battery pack in the right gluteal subcutaneous soft tissues. IMPRESSION: 1. No acute  intra-abdominal or pelvic pathology. 2. Postsurgical changes of gastric bypass. Small hiatal hernia containing the anastomotic suture similar to prior CT. No bowel obstruction. Electronically Signed   By: Anner Crete M.D.   On: 06/17/2021 17:48   DG Chest 2 View  Result Date: 06/17/2021 CLINICAL DATA:  Chest pain.  Shortness of breath.  Dry cough. EXAM: CHEST - 2 VIEW COMPARISON:  One-view chest x-ray 06/05/2021 FINDINGS: Is mildly enlarged. Previously seen airspace disease has resolved. No edema or effusion is present. Spinal cord  stimulator noted. Other lines or tubes are present. IMPRESSION: Mild cardiomegaly without failure. Electronically Signed   By: San Morelle M.D.   On: 06/17/2021 14:11    EKG: Independently reviewed.  NSR with rate 76   Labs on Admission: I have personally reviewed the available labs and imaging studies at the time of the admission.  Pertinent labs: D-dimer 0.99, creatinine 1.17, blood glucose 178.     Assessment/Plan: Acute dyspnea secondary to COVID-19 infection: The patient will be admitted for observation to the medical/surgical floor.  D-dimer is elevated.  Unfortunately she has iodine allergy therefore unable to obtain CT PE study.  VQ scan cannot be done overnight.  We will obtain a VQ scan in the morning to rule out pulmonary embolism.  The patient will be started on remdesivir.  No infiltrate or consolidation on plain film imaging therefore will not start any antibiotics for any superimposed bacterial pneumonia.  She is not acutely hypoxic therefore will not start steroids.  Chest pain: Plan as above.  Troponins unremarkable.  EKG shows no acute ischemic changes.  Will rule out PE with VQ scan.  Melena likely secondary to upper GI bleed: The patient has a history of peptic ulcer disease.  The patient has been given a bolus of Protonix in the emergency department 80 mg x 1.  Subsequently started on a Protonix drip.  Reassuringly she is  hemodynamically stable.  She is not acutely anemic.  Day team can discuss with GI if they want to pursue inpatient endoscopy.  She is pretty tender in the epigastric area on palpation.  Concern that she may have a peptic ulcer.  Other chronic conditions: Hyperlipidemia, non-insulin-dependent diabetes mellitus, GERD, COPD, hypertension, history of CVA, coronary artery disease with history of MI in 2002: Resume home medications once med rec has been completed.  Level of Care: MedSurg DVT prophylaxis: SCDs Code Status: Full code Consults: None Admission status: Observation   Leslee Home DO [facility]   How to contact the [facility] Attending or Consulting provider [place] or covering provider during after hours [place], for this patient?  Check the care team in [facility] and look for a) attending/consulting [facility] provider listed and b) the [facility] team listed Log into [email or web address] and use [place]'s universal password to access. If you do not have the password, please contact the hospital operator. Locate the [facility] provider you are looking for under [facility] and page to a number that you can be directly reached. If you still have difficulty reaching the provider, please page the [facility] (Director on Call) for the Hospitalists listed on amion for assistance.   06/17/2021, 11:06 PM

## 2021-06-17 NOTE — Progress Notes (Signed)
PHARMACIST - PHYSICIAN COMMUNICATION  CONCERNING:  Enoxaparin (Lovenox) for DVT Prophylaxis    RECOMMENDATION: Patient was prescribed enoxaprin 40mg  q24 hours for VTE prophylaxis.   Filed Weights   06/17/21 1220  Weight: 113.4 kg (250 lb)    Body mass index is 39.16 kg/m.  Estimated Creatinine Clearance: 64.8 mL/min (A) (by C-G formula based on SCr of 1.17 mg/dL (H)).   Based on [place] patient is candidate for enoxaparin 0.5mg /kg TBW SQ every 24 hours based on BMI being >30.  DESCRIPTION: Pharmacy has adjusted enoxaparin dose per [facility] policy.  Patient is now receiving enoxaparin 57.5 mg every 24 hours    Sherilyn Banker, PharmD Clinical Pharmacist  06/17/2021 10:31 PM

## 2021-06-18 ENCOUNTER — Observation Stay: Payer: Medicare Other

## 2021-06-18 ENCOUNTER — Encounter: Payer: Self-pay | Admitting: Family Medicine

## 2021-06-18 DIAGNOSIS — E119 Type 2 diabetes mellitus without complications: Secondary | ICD-10-CM | POA: Diagnosis present

## 2021-06-18 DIAGNOSIS — R079 Chest pain, unspecified: Secondary | ICD-10-CM | POA: Diagnosis not present

## 2021-06-18 DIAGNOSIS — K449 Diaphragmatic hernia without obstruction or gangrene: Secondary | ICD-10-CM | POA: Diagnosis present

## 2021-06-18 DIAGNOSIS — R7989 Other specified abnormal findings of blood chemistry: Secondary | ICD-10-CM | POA: Diagnosis not present

## 2021-06-18 DIAGNOSIS — Z8711 Personal history of peptic ulcer disease: Secondary | ICD-10-CM | POA: Diagnosis not present

## 2021-06-18 DIAGNOSIS — E669 Obesity, unspecified: Secondary | ICD-10-CM | POA: Diagnosis present

## 2021-06-18 DIAGNOSIS — R001 Bradycardia, unspecified: Secondary | ICD-10-CM | POA: Diagnosis present

## 2021-06-18 DIAGNOSIS — D509 Iron deficiency anemia, unspecified: Secondary | ICD-10-CM | POA: Diagnosis present

## 2021-06-18 DIAGNOSIS — K921 Melena: Secondary | ICD-10-CM | POA: Diagnosis not present

## 2021-06-18 DIAGNOSIS — I252 Old myocardial infarction: Secondary | ICD-10-CM | POA: Diagnosis not present

## 2021-06-18 DIAGNOSIS — G8929 Other chronic pain: Secondary | ICD-10-CM | POA: Diagnosis present

## 2021-06-18 DIAGNOSIS — D62 Acute posthemorrhagic anemia: Secondary | ICD-10-CM | POA: Diagnosis present

## 2021-06-18 DIAGNOSIS — Z9884 Bariatric surgery status: Secondary | ICD-10-CM | POA: Diagnosis not present

## 2021-06-18 DIAGNOSIS — I2699 Other pulmonary embolism without acute cor pulmonale: Secondary | ICD-10-CM

## 2021-06-18 DIAGNOSIS — R059 Cough, unspecified: Secondary | ICD-10-CM | POA: Diagnosis not present

## 2021-06-18 DIAGNOSIS — K2100 Gastro-esophageal reflux disease with esophagitis, without bleeding: Secondary | ICD-10-CM | POA: Diagnosis present

## 2021-06-18 DIAGNOSIS — U071 COVID-19: Secondary | ICD-10-CM | POA: Diagnosis present

## 2021-06-18 DIAGNOSIS — Z8673 Personal history of transient ischemic attack (TIA), and cerebral infarction without residual deficits: Secondary | ICD-10-CM | POA: Diagnosis not present

## 2021-06-18 DIAGNOSIS — F1320 Sedative, hypnotic or anxiolytic dependence, uncomplicated: Secondary | ICD-10-CM | POA: Diagnosis present

## 2021-06-18 DIAGNOSIS — Z6839 Body mass index (BMI) 39.0-39.9, adult: Secondary | ICD-10-CM | POA: Diagnosis not present

## 2021-06-18 DIAGNOSIS — R0602 Shortness of breath: Secondary | ICD-10-CM | POA: Diagnosis present

## 2021-06-18 DIAGNOSIS — J449 Chronic obstructive pulmonary disease, unspecified: Secondary | ICD-10-CM | POA: Diagnosis present

## 2021-06-18 DIAGNOSIS — I2510 Atherosclerotic heart disease of native coronary artery without angina pectoris: Secondary | ICD-10-CM | POA: Diagnosis present

## 2021-06-18 DIAGNOSIS — R0600 Dyspnea, unspecified: Secondary | ICD-10-CM | POA: Diagnosis not present

## 2021-06-18 DIAGNOSIS — Z91041 Radiographic dye allergy status: Secondary | ICD-10-CM | POA: Diagnosis not present

## 2021-06-18 DIAGNOSIS — E785 Hyperlipidemia, unspecified: Secondary | ICD-10-CM | POA: Diagnosis present

## 2021-06-18 DIAGNOSIS — F419 Anxiety disorder, unspecified: Secondary | ICD-10-CM | POA: Diagnosis present

## 2021-06-18 DIAGNOSIS — I110 Hypertensive heart disease with heart failure: Secondary | ICD-10-CM | POA: Diagnosis present

## 2021-06-18 DIAGNOSIS — I509 Heart failure, unspecified: Secondary | ICD-10-CM | POA: Diagnosis present

## 2021-06-18 LAB — CBG MONITORING, ED
Glucose-Capillary: 143 mg/dL — ABNORMAL HIGH (ref 70–99)
Glucose-Capillary: 95 mg/dL (ref 70–99)

## 2021-06-18 LAB — PROTIME-INR
INR: 1.1 (ref 0.8–1.2)
Prothrombin Time: 13.7 seconds (ref 11.4–15.2)

## 2021-06-18 LAB — HEPARIN LEVEL (UNFRACTIONATED): Heparin Unfractionated: >1.1 IU/mL — ABNORMAL HIGH (ref 0.30–0.70)

## 2021-06-18 LAB — APTT: aPTT: 26 seconds (ref 24–36)

## 2021-06-18 MED ORDER — LORATADINE 10 MG PO TABS
10.0000 mg | ORAL_TABLET | Freq: Every day | ORAL | Status: DC
  Administered 2021-06-19 – 2021-06-22 (×4): 10 mg via ORAL
  Filled 2021-06-18 (×4): qty 1

## 2021-06-18 MED ORDER — ALPRAZOLAM 0.5 MG PO TABS
0.2500 mg | ORAL_TABLET | Freq: Three times a day (TID) | ORAL | Status: DC
  Administered 2021-06-18 – 2021-06-22 (×13): 0.25 mg via ORAL
  Filled 2021-06-18 (×13): qty 1

## 2021-06-18 MED ORDER — LINAGLIPTIN 5 MG PO TABS
5.0000 mg | ORAL_TABLET | Freq: Every day | ORAL | Status: DC
  Administered 2021-06-18 – 2021-06-22 (×5): 5 mg via ORAL
  Filled 2021-06-18 (×7): qty 1

## 2021-06-18 MED ORDER — TECHNETIUM TO 99M ALBUMIN AGGREGATED
4.1700 | Freq: Once | INTRAVENOUS | Status: AC | PRN
  Administered 2021-06-18: 4.17 via INTRAVENOUS

## 2021-06-18 MED ORDER — FOLIC ACID 1 MG PO TABS
1.0000 mg | ORAL_TABLET | Freq: Every day | ORAL | Status: DC
  Administered 2021-06-18 – 2021-06-22 (×5): 1 mg via ORAL
  Filled 2021-06-18 (×5): qty 1

## 2021-06-18 MED ORDER — MIRTAZAPINE 15 MG PO TABS
45.0000 mg | ORAL_TABLET | Freq: Every day | ORAL | Status: DC
  Administered 2021-06-18 – 2021-06-21 (×4): 45 mg via ORAL
  Filled 2021-06-18 (×4): qty 3

## 2021-06-18 MED ORDER — HEPARIN (PORCINE) 25000 UT/250ML-% IV SOLN
1100.0000 [IU]/h | INTRAVENOUS | Status: DC
  Administered 2021-06-19 – 2021-06-20 (×2): 1100 [IU]/h via INTRAVENOUS
  Filled 2021-06-18 (×2): qty 250

## 2021-06-18 MED ORDER — ALBUTEROL SULFATE HFA 108 (90 BASE) MCG/ACT IN AERS
2.0000 | INHALATION_SPRAY | RESPIRATORY_TRACT | Status: DC | PRN
  Filled 2021-06-18: qty 6.7

## 2021-06-18 MED ORDER — OXYCODONE HCL 5 MG PO TABS
20.0000 mg | ORAL_TABLET | Freq: Four times a day (QID) | ORAL | Status: DC
  Administered 2021-06-18 (×3): 20 mg via ORAL
  Filled 2021-06-18 (×3): qty 4

## 2021-06-18 MED ORDER — IPRATROPIUM-ALBUTEROL 20-100 MCG/ACT IN AERS
1.0000 | INHALATION_SPRAY | Freq: Three times a day (TID) | RESPIRATORY_TRACT | Status: DC
  Administered 2021-06-18 – 2021-06-22 (×12): 1 via RESPIRATORY_TRACT
  Filled 2021-06-18: qty 4

## 2021-06-18 MED ORDER — HEPARIN BOLUS VIA INFUSION
5000.0000 [IU] | Freq: Once | INTRAVENOUS | Status: AC
  Administered 2021-06-18: 5000 [IU] via INTRAVENOUS
  Filled 2021-06-18: qty 5000

## 2021-06-18 MED ORDER — INSULIN ASPART 100 UNIT/ML IJ SOLN
0.0000 [IU] | Freq: Every day | INTRAMUSCULAR | Status: DC
  Administered 2021-06-20: 2 [IU] via SUBCUTANEOUS
  Filled 2021-06-18: qty 1

## 2021-06-18 MED ORDER — ATORVASTATIN CALCIUM 20 MG PO TABS
40.0000 mg | ORAL_TABLET | Freq: Every day | ORAL | Status: DC
  Administered 2021-06-19 – 2021-06-22 (×4): 40 mg via ORAL
  Filled 2021-06-18 (×4): qty 2

## 2021-06-18 MED ORDER — BUSPIRONE HCL 15 MG PO TABS
7.5000 mg | ORAL_TABLET | Freq: Two times a day (BID) | ORAL | Status: DC
  Administered 2021-06-18 – 2021-06-22 (×9): 7.5 mg via ORAL
  Filled 2021-06-18 (×3): qty 1
  Filled 2021-06-18: qty 2
  Filled 2021-06-18 (×3): qty 1
  Filled 2021-06-18: qty 2
  Filled 2021-06-18 (×3): qty 1

## 2021-06-18 MED ORDER — FLUTICASONE PROPIONATE 50 MCG/ACT NA SUSP
1.0000 | Freq: Every day | NASAL | Status: DC
  Administered 2021-06-18 – 2021-06-22 (×5): 1 via NASAL
  Filled 2021-06-18: qty 16

## 2021-06-18 MED ORDER — HEPARIN (PORCINE) 25000 UT/250ML-% IV SOLN
1400.0000 [IU]/h | INTRAVENOUS | Status: DC
  Administered 2021-06-18: 1400 [IU]/h via INTRAVENOUS
  Filled 2021-06-18: qty 250

## 2021-06-18 MED ORDER — LIDOCAINE HCL (PF) 1 % IJ SOLN
INTRAMUSCULAR | Status: AC
  Filled 2021-06-18: qty 5

## 2021-06-18 MED ORDER — INSULIN ASPART 100 UNIT/ML IJ SOLN
0.0000 [IU] | Freq: Three times a day (TID) | INTRAMUSCULAR | Status: DC
  Administered 2021-06-18 – 2021-06-20 (×2): 2 [IU] via SUBCUTANEOUS
  Administered 2021-06-20 (×2): 3 [IU] via SUBCUTANEOUS
  Administered 2021-06-21 – 2021-06-22 (×4): 2 [IU] via SUBCUTANEOUS
  Filled 2021-06-18 (×9): qty 1

## 2021-06-18 MED ORDER — OXYBUTYNIN CHLORIDE 5 MG PO TABS
5.0000 mg | ORAL_TABLET | Freq: Two times a day (BID) | ORAL | Status: DC
  Administered 2021-06-18 – 2021-06-22 (×9): 5 mg via ORAL
  Filled 2021-06-18 (×10): qty 1

## 2021-06-18 MED ORDER — INSULIN DETEMIR 100 UNIT/ML ~~LOC~~ SOLN
25.0000 [IU] | Freq: Every day | SUBCUTANEOUS | Status: DC
  Administered 2021-06-18 – 2021-06-22 (×5): 25 [IU] via SUBCUTANEOUS
  Filled 2021-06-18 (×8): qty 0.25

## 2021-06-18 NOTE — Consult Note (Signed)
[place] for IV Heparin Indication: pulmonary embolus  Patient Measurements: Heparin Dosing Weight: 87.9 kg  Labs: Recent Labs    06/17/21 1452 06/17/21 1930 06/18/21 1336 06/18/21 2009  HGB 13.0  --   --   --   HCT 40.7  --   --   --   PLT 384  --   --   --   APTT  --   --  26  --   LABPROT  --   --  13.7  --   INR  --   --  1.1  --   HEPARINUNFRC  --   --   --  >1.10*  CREATININE 1.17*  --   --   --   TROPONINIHS 3 3  --   --      Estimated Creatinine Clearance: 64.8 mL/min (A) (by C-G formula based on SCr of 1.17 mg/dL (H)).   Medical History: Past Medical History:  Diagnosis Date   Allergy    Anemia    Arthritis    knees   COPD (chronic obstructive pulmonary disease) ([place])    Diabetes mellitus without complication (HCC)    diet controlled   GERD (gastroesophageal reflux disease)    Heart attack ([place]) 2002   Hyperlipidemia    Hypertension    PUD (peptic ulcer disease)    Sleep apnea    can't tolerate CPAP.  Uses O2 only   Spinal cord stimulator status    for lower back pain   Stroke [facility]) 2004   "mini-stroke"   Vertigo    Wears dentures    full upper and lower    Medications:  No anticoagulation prior to admission per my chart review  Assessment: Patient is a 60 y/o F with medical history as above who is admitted with COVID-19 infection, chest pain, and melena possibly secondary to peptic ulcer which she has a history of. VQ scan with moderate-high suspicion for PE. Patient is hemodynamically stable but bradycardic. Per GI note, no contraindication to start therapeutic anticoagulation as patient is not actively bleeding nor does she has hemodynamically significant GI bleed. Pharmacy consulted to initiate heparin infusion for suspected PE.   Baseline CBC with H&H, platelets within normal limits. Baseline aPTT and PT-INR are also WNL  7/23 @2009  HL >1.10 - spoke with nurse and confirmed lab was not drawn from same  arm as heparin infusion  Goal of Therapy:  Heparin level 0.3-0.7 units/ml Monitor platelets by anticoagulation protocol: Yes   Plan:  HL supratherapeutic, will hold heparin infusion for 1 hour and decrease rate to 1200 units/hr to start ~2200 Will check HL 6 hours after restart of heparin infusion Daily CBC per protocol Monitor closely for active GIB, worsening anemia given presentation / history  Billy Turvey O [place] 06/18/2021,8:48 PM

## 2021-06-18 NOTE — Consult Note (Addendum)
Lindsey Darby, MD [address]  [place]  [place], [place] [address]  Main: [phone]  Fax: [phone] Pager: [phone]   Consultation  Referring Provider:     No ref. provider found Primary Care Physician:  Jodi Marble, MD Primary Gastroenterologist:  Dr. Alice Reichert         Reason for Consultation:     Epigastric pain  Date of Admission:  06/17/2021 Date of Consultation:  06/18/2021         HPI:   Lindsey [name] is a 60 y.o. female with history of gastric bypass, olmesartan induced enteritis, history of diabetes, coronary disease, COPD Patient was recently discharged from [facility] secondary to hypoxic and hypercapnic respiratory failure 2 weeks ago, presented with chest pain, shortness of breath, dry cough since discharge.  She also reported black stool from Wednesday through Friday, 3 episodes.  Patient is diagnosed with SARS COVID-19 infection in the ER.  She underwent VQ scan which confirmed multiple moderate perfusion defect throughout the right upper lobes indicating moderate to high suspicion for acute pulmonary embolism.  Lower extremity Dopplers were negative for DVT.  She also underwent CT abdomen pelvis without contrast as she was complaining of upper abdominal burning pain, no acute intra-abdominal pathology revealed.  Small hiatal hernia.  GI is consulted for melena.  Labs revealed normal hemoglobin which is at her baseline.  No leukocytosis, no evidence of cirrhosis, mildly elevated BUN/creatinine. Patient is on Nexium 40 mg p.o. twice daily as outpatient Patient had work-up for iron deficiency anemia including upper endoscopy, colonoscopy as well as video capsule endoscopy which were all unremarkable.  Her iron deficiency anemia was attributed to history of gastric bypass.  NSAIDs: None  Antiplts/Anticoagulants/Anti thrombotics: None  GI Procedures:  Upper endoscopy 05/05/2020 - Benign-appearing esophageal stenosis. - Non-severe esophagitis with no  bleeding. - Gastric bypass with a small-sized pouch and intact staple line. Gastrojejunal anastomosis characterized by healthy appearing mucosa. - No specimens collected.  Colonoscopy 04/17/2017 - The examination was otherwise normal on direct and retroflexion views. - No specimens collected.  Video capsule endoscopy 05/10/2017 Normal  Past Medical History:  Diagnosis Date   Allergy    Anemia    Arthritis    knees   COPD (chronic obstructive pulmonary disease) ([place])    Diabetes mellitus without complication (HCC)    diet controlled   GERD (gastroesophageal reflux disease)    Heart attack ([place]) 2002   Hyperlipidemia    Hypertension    PUD (peptic ulcer disease)    Sleep apnea    can't tolerate CPAP.  Uses O2 only   Spinal cord stimulator status    for lower back pain   Stroke [facility]) 2004   "mini-stroke"   Vertigo    Wears dentures    full upper and lower    Past Surgical History:  Procedure Laterality Date   ABDOMINAL HYSTERECTOMY     back surgery     broken wrist     COLONOSCOPY WITH PROPOFOL N/A 01/30/2017   Procedure: COLONOSCOPY WITH PROPOFOL;  Surgeon: Jonathon Bellows, MD;  Location: [facility] ENDOSCOPY;  Service: Endoscopy;  Laterality: N/A;   COLONOSCOPY WITH PROPOFOL N/A 02/20/2017   Procedure: Colonoscopy with propofol ;  Surgeon: Jonathon Bellows, MD;  Location: [facility] ENDOSCOPY;  Service: Endoscopy;  Laterality: N/A;   COLONOSCOPY WITH PROPOFOL N/A 03/13/2017   Procedure: COLONOSCOPY WITH PROPOFOL;  Surgeon: Jonathon Bellows, MD;  Location: [facility] ENDOSCOPY;  Service: Endoscopy;  Laterality: N/A;  COLONOSCOPY WITH PROPOFOL N/A 04/17/2017   Procedure: COLONOSCOPY WITH PROPOFOL;  Surgeon: Jonathon Bellows, MD;  Location: [facility] ENDOSCOPY;  Service: Endoscopy;  Laterality: N/A;   ESOPHAGOGASTRODUODENOSCOPY N/A 05/05/2020   Procedure: ESOPHAGOGASTRODUODENOSCOPY (EGD);  Surgeon: Toledo, Benay Pike, MD;  Location: [facility] ENDOSCOPY;  Service: Gastroenterology;  Laterality: N/A;   ESOPHAGOGASTRODUODENOSCOPY  (EGD) WITH PROPOFOL N/A 09/28/2016   Procedure: ESOPHAGOGASTRODUODENOSCOPY (EGD) WITH PROPOFOL;  Surgeon: Manya Silvas, MD;  Location: [facility] ENDOSCOPY;  Service: Endoscopy;  Laterality: N/A;   ESOPHAGOGASTRODUODENOSCOPY (EGD) WITH PROPOFOL N/A 03/13/2017   Procedure: ESOPHAGOGASTRODUODENOSCOPY (EGD) WITH PROPOFOL;  Surgeon: Jonathon Bellows, MD;  Location: [facility] ENDOSCOPY;  Service: Endoscopy;  Laterality: N/A;   GALLBLADDER SURGERY     GASTRIC BYPASS     GIVENS CAPSULE STUDY N/A 03/13/2017   Procedure: GIVENS CAPSULE STUDY;  Surgeon: Jonathon Bellows, MD;  Location: [facility] ENDOSCOPY;  Service: Endoscopy;  Laterality: N/A;   HERNIA REPAIR     JOINT REPLACEMENT  1995   REPLACEMENT TOTAL KNEE     ROTATOR CUFF REPAIR      Prior to Admission medications   Medication Sig Start Date End Date Taking? Authorizing Provider  albuterol (PROVENTIL HFA;VENTOLIN HFA) 108 (90 BASE) MCG/ACT inhaler Inhale 2 puffs into the lungs every 4 (four) hours as needed for wheezing or shortness of breath.   Yes [provider]  albuterol (PROVENTIL) (2.5 MG/3ML) 0.083% nebulizer solution Take 2.5 mg by nebulization every 6 (six) hours as needed for wheezing or shortness of breath.   Yes [provider]  ALPRAZolam (XANAX) 0.25 MG tablet Take 0.25 mg by mouth 3 (three) times daily.   Yes [provider]  atorvastatin (LIPITOR) 40 MG tablet Take 40 mg by mouth daily. 03/18/21  Yes [provider]  busPIRone (BUSPAR) 7.5 MG tablet Take 7.5 mg by mouth 2 (two) times daily.   Yes [provider]  Calcium Carbonate-Vitamin D (OYSTER SHELL CALCIUM 500 + D PO) Take 1 tablet by mouth daily. 12/26/16  Yes [provider]  cloNIDine (CATAPRES) 0.1 MG tablet Take 0.1 mg by mouth at bedtime. 06/13/17  Yes [provider]  dapagliflozin propanediol (FARXIGA) 10 MG TABS tablet Take 10 mg by mouth daily.   Yes [provider]  diclofenac Sodium (VOLTAREN) 1 % GEL Apply 4 g  topically See admin instructions. Apply 4 grams twice a week to neck.   Yes [provider]  esomeprazole (NEXIUM) 40 MG capsule Take 40 mg by mouth 2 (two) times daily.   Yes [provider]  ferrous sulfate 325 (65 FE) MG tablet Take 1 tablet (325 mg total) by mouth 2 (two) times daily with a meal. 12/26/16  Yes Sudini, Srikar, MD  fexofenadine (ALLEGRA) 180 MG tablet Take 180 mg by mouth daily.  01/25/17  Yes [provider]  fluticasone (FLONASE) 50 MCG/ACT nasal spray Place 1 spray into both nostrils daily.   Yes [provider]  folic acid (FOLVITE) 1 MG tablet Take 1 mg by mouth daily.   Yes [provider]  hydrOXYzine (ATARAX/VISTARIL) 25 MG tablet Take 25 mg by mouth 3 (three) times daily. 06/13/21  Yes [provider]  meclizine (ANTIVERT) 25 MG tablet Take 1 tablet (25 mg total) by mouth 3 (three) times daily as needed for dizziness or nausea. 01/23/17  Yes Earleen Newport, MD  metFORMIN (GLUCOPHAGE-XR) 750 MG 24 hr tablet Take 1,500 mg by mouth every morning. 05/12/21  Yes [provider]  metoprolol succinate (TOPROL-XL) 100  MG 24 hr tablet Take 100 mg by mouth daily. 06/01/18  Yes [provider]  mirtazapine (REMERON) 45 MG tablet Take 45 mg by mouth at bedtime. 03/21/17  Yes [provider]  naloxone Karma Greaser) nasal spray 4 mg/0.1 mL One intranasal for altered mental status 06/07/21  Yes Wieting, Richard, MD  olmesartan-hydrochlorothiazide (BENICAR HCT) 20-12.5 MG tablet Take 1 tablet by mouth every morning. 04/11/18  Yes [provider]  oxybutynin (DITROPAN) 5 MG tablet Take 5 mg by mouth 2 (two) times daily. 03/18/13  Yes [provider]  Oxycodone HCl 20 MG TABS Take 0.5 tablets (10 mg total) by mouth 4 (four) times daily. Patient taking differently: Take 20 mg by mouth 4 (four) times daily. 06/07/21  Yes Wieting, Richard, MD  potassium chloride (KLOR-CON) 10 MEQ tablet Take 10 mEq by mouth  every morning. 09/13/18  Yes [provider]  saxagliptin HCl (ONGLYZA) 5 MG TABS tablet Take 5 mg by mouth in the morning. 05/17/18  Yes [provider]  Semaglutide 14 MG TABS Take 14 mg by mouth in the morning.   Yes [provider]  Suvorexant (BELSOMRA) 15 MG TABS Take 1 tablet by mouth at bedtime.   Yes [provider]  TRADJENTA 5 MG TABS tablet Take 5 mg by mouth daily. 04/27/21  Yes [provider]  TRESIBA FLEXTOUCH 200 UNIT/ML FlexTouch Pen Inject 56 Units into the skin every morning. 06/13/21  Yes [provider]  UBRELVY 100 MG TABS Take 100 mg by mouth as needed. 06/15/21  Yes [provider]  zolpidem (AMBIEN) 10 MG tablet Take 0.5 tablets (5 mg total) by mouth at bedtime as needed for sleep. 06/07/21  Yes Wieting, Richard, MD  metFORMIN (GLUCOPHAGE-XR) 500 MG 24 hr tablet Take 1,000 mg by mouth daily with breakfast. Patient not taking: No sig reported    [provider]   Current Facility-Administered Medications:    albuterol (VENTOLIN HFA) 108 (90 Base) MCG/ACT inhaler 2 puff, 2 puff, Inhalation, Q4H PRN, Patrecia Pour, MD   ALPRAZolam Duanne Moron) tablet 0.25 mg, 0.25 mg, Oral, TID, Patrecia Pour, MD   [START ON 06/19/2021] atorvastatin (LIPITOR) tablet 40 mg, 40 mg, Oral, Daily, Vance Gather B, MD   busPIRone (BUSPAR) tablet 7.5 mg, 7.5 mg, Oral, BID, Patrecia Pour, MD   fluticasone (FLONASE) 50 MCG/ACT nasal spray 1 spray, 1 spray, Each Nare, Daily, Patrecia Pour, MD   folic acid (FOLVITE) tablet 1 mg, 1 mg, Oral, Daily, Vance Gather B, MD   heparin ADULT infusion 100 units/mL (25000 units/263mL), 1,400 Units/hr, Intravenous, Continuous, Chappell, Alex B, RPH   heparin bolus via infusion 5,000 Units, 5,000 Units, Intravenous, Once, Benita Gutter, RPH   Ipratropium-Albuterol (COMBIVENT) respimat 1 puff, 1 puff, Inhalation, TID, Patrecia Pour, MD   linagliptin (TRADJENTA) tablet 5 mg, 5 mg, Oral, Daily, Patrecia Pour, MD   [START ON 06/19/2021] loratadine (CLARITIN) tablet 10 mg, 10 mg, Oral, Daily, Vance Gather B, MD   mirtazapine (REMERON) tablet 45 mg, 45 mg, Oral, QHS, Bonner Puna, Ryan B, MD   ondansetron (ZOFRAN) tablet 4 mg, 4 mg, Oral, Q6H PRN **OR** ondansetron (ZOFRAN) injection 4 mg, 4 mg, Intravenous, Q6H PRN, Rowe Pavy, Shayan S, DO   oxybutynin (DITROPAN) tablet 5 mg, 5 mg, Oral, BID, Vance Gather B, MD   oxyCODONE (Oxy IR/ROXICODONE) immediate release tablet 20 mg, 20 mg, Oral, QID, Bonner Puna, Ryan B, MD   pantoprozole (PROTONIX) 80 mg /NS 100 mL  infusion, 8 mg/hr, Intravenous, Continuous, Nance Pear, MD, Paused at 06/18/21 1338   polyethylene glycol (MIRALAX / GLYCOLAX) packet 17 g, 17 g, Oral, Daily PRN, Leslee Home, DO   [COMPLETED] remdesivir 200 mg in sodium chloride 0.9% 250 mL IVPB, 200 mg, Intravenous, Once, Stopped at 06/18/21 0043 **FOLLOWED BY** remdesivir 100 mg in sodium chloride 0.9 % 100 mL IVPB, 100 mg, Intravenous, Daily, Anwar, Shayan S, DO, Stopped at 06/18/21 1240   traMADol (ULTRAM) tablet 50 mg, 50 mg, Oral, Q6H PRN, Imagene Sheller S, DO, 50 mg at 06/18/21 0907  Current Outpatient Medications:    albuterol (PROVENTIL HFA;VENTOLIN HFA) 108 (90 BASE) MCG/ACT inhaler, Inhale 2 puffs into the lungs every 4 (four) hours as needed for wheezing or shortness of breath., Disp: , Rfl:    albuterol (PROVENTIL) (2.5 MG/3ML) 0.083% nebulizer solution, Take 2.5 mg by nebulization every 6 (six) hours as needed for wheezing or shortness of breath., Disp: , Rfl:    ALPRAZolam (XANAX) 0.25 MG tablet, Take 0.25 mg by mouth 3 (three) times daily., Disp: , Rfl:    atorvastatin (LIPITOR) 40 MG tablet, Take 40 mg by mouth daily., Disp: , Rfl:    busPIRone (BUSPAR) 7.5 MG tablet, Take 7.5 mg by mouth 2 (two) times daily., Disp: , Rfl:    Calcium Carbonate-Vitamin D (OYSTER SHELL CALCIUM 500 + D PO), Take 1 tablet by mouth daily., Disp: , Rfl: 0   cloNIDine (CATAPRES) 0.1 MG tablet, Take 0.1 mg by mouth  at bedtime., Disp: , Rfl:    dapagliflozin propanediol (FARXIGA) 10 MG TABS tablet, Take 10 mg by mouth daily., Disp: , Rfl:    diclofenac Sodium (VOLTAREN) 1 % GEL, Apply 4 g topically See admin instructions. Apply 4 grams twice a week to neck., Disp: , Rfl:    esomeprazole (NEXIUM) 40 MG capsule, Take 40 mg by mouth 2 (two) times daily., Disp: , Rfl:    ferrous sulfate 325 (65 FE) MG tablet, Take 1 tablet (325 mg total) by mouth 2 (two) times daily with a meal., Disp: 60 tablet, Rfl: 0   fexofenadine (ALLEGRA) 180 MG tablet, Take 180 mg by mouth daily. , Disp: , Rfl: 0   fluticasone (FLONASE) 50 MCG/ACT nasal spray, Place 1 spray into both nostrils daily., Disp: , Rfl:    folic acid (FOLVITE) 1 MG tablet, Take 1 mg by mouth daily., Disp: , Rfl:    hydrOXYzine (ATARAX/VISTARIL) 25 MG tablet, Take 25 mg by mouth 3 (three) times daily., Disp: , Rfl:    meclizine (ANTIVERT) 25 MG tablet, Take 1 tablet (25 mg total) by mouth 3 (three) times daily as needed for dizziness or nausea., Disp: 30 tablet, Rfl: 1   metFORMIN (GLUCOPHAGE-XR) 750 MG 24 hr tablet, Take 1,500 mg by mouth every morning., Disp: , Rfl:    metoprolol succinate (TOPROL-XL) 100 MG 24 hr tablet, Take 100 mg by mouth daily., Disp: , Rfl:    mirtazapine (REMERON) 45 MG tablet, Take 45 mg by mouth at bedtime., Disp: , Rfl:    naloxone (NARCAN) nasal spray 4 mg/0.1 mL, One intranasal for altered mental status, Disp: 2 each, Rfl: 0   olmesartan-hydrochlorothiazide (BENICAR HCT) 20-12.5 MG tablet, Take 1 tablet by mouth every morning., Disp: , Rfl:    oxybutynin (DITROPAN) 5 MG tablet, Take 5 mg by mouth 2 (two) times daily., Disp: , Rfl:    Oxycodone HCl 20 MG TABS, Take 0.5 tablets (10 mg total) by mouth 4 (four) times daily. (  Patient taking differently: Take 20 mg by mouth 4 (four) times daily.), Disp: , Rfl: 0   potassium chloride (KLOR-CON) 10 MEQ tablet, Take 10 mEq by mouth every morning., Disp: , Rfl:    saxagliptin HCl (ONGLYZA) 5 MG  TABS tablet, Take 5 mg by mouth in the morning., Disp: , Rfl:    Semaglutide 14 MG TABS, Take 14 mg by mouth in the morning., Disp: , Rfl:    Suvorexant (BELSOMRA) 15 MG TABS, Take 1 tablet by mouth at bedtime., Disp: , Rfl:    TRADJENTA 5 MG TABS tablet, Take 5 mg by mouth daily., Disp: , Rfl:    TRESIBA FLEXTOUCH 200 UNIT/ML FlexTouch Pen, Inject 56 Units into the skin every morning., Disp: , Rfl:    UBRELVY 100 MG TABS, Take 100 mg by mouth as needed., Disp: , Rfl:    zolpidem (AMBIEN) 10 MG tablet, Take 0.5 tablets (5 mg total) by mouth at bedtime as needed for sleep., Disp: 30 tablet, Rfl: 0   metFORMIN (GLUCOPHAGE-XR) 500 MG 24 hr tablet, Take 1,000 mg by mouth daily with breakfast. (Patient not taking: No sig reported), Disp: , Rfl:    Family History  Problem Relation Age of Onset   Hypertension Father    COPD Father    Heart disease Father    Breast cancer Neg Hx      Social History   Tobacco Use   Smoking status: Never   Smokeless tobacco: Never  Vaping Use   Vaping Use: Never used  Substance Use Topics   Alcohol use: No    Alcohol/week: 0.0 standard drinks   Drug use: No    Allergies as of 06/17/2021 - Review Complete 06/17/2021  Allergen Reaction Noted   Morphine Hives 04/12/2015   Iodinated diagnostic agents Hives 04/12/2015   Aspirin Hives 09/02/2015   Etodolac Hives 04/12/2015   Ibuprofen Hives 04/12/2015   Shellfish allergy Hives 04/12/2015   Tylenol [acetaminophen] Hives 09/27/2016    Review of Systems:    All systems reviewed and negative except where noted in HPI.   Physical Exam:  Vital signs in last 24 hours: Temp:  [98 F (36.7 C)] 98 F (36.7 C) (07/22 1800) Pulse Rate:  [48-104] 104 (07/23 1300) Resp:  [11-19] 15 (07/23 1300) BP: (106-141)/(63-101) 121/73 (07/23 1100) SpO2:  [90 %-100 %] 100 % (07/23 1300)   General:   Pleasant, cooperative in NAD Head:  Normocephalic and atraumatic. Eyes:   No icterus.   Conjunctiva pink. PERRLA. Ears:   Normal auditory acuity. Neck:  Supple; no masses or thyroidomegaly Lungs: Respirations even and unlabored. Lungs clear to auscultation bilaterally.   No wheezes, crackles, or rhonchi.  Heart:  Regular rate and rhythm;  Without murmur, clicks, rubs or gallops Abdomen:  Soft, nondistended, epigastric tender. Normal bowel sounds. No appreciable masses or hepatomegaly.  No rebound or guarding.  Rectal:  Not performed. Msk:  Symmetrical without gross deformities.  Extremities:  Without edema, cyanosis or clubbing. Neurologic:  Alert and oriented x3;  grossly normal neurologically. Skin:  Intact without significant lesions or rashes. Cervical Nodes:  No significant cervical adenopathy. Psych:  Alert and cooperative. Normal affect.  LAB RESULTS: CBC Latest Ref Rng & Units 06/17/2021 06/06/2021 06/05/2021  WBC 4.0 - 10.5 K/uL 8.2 12.4(H) 17.0(H)  Hemoglobin 12.0 - 15.0 g/dL 13.0 12.7 12.4  Hematocrit 36.0 - 46.0 % 40.7 39.6 40.1  Platelets 150 - 400 K/uL 384 297 338    BMET BMP Latest Ref Rng &  Units 06/17/2021 06/06/2021 06/05/2021  Glucose 70 - 99 mg/dL 178(H) 111(H) 333(H)  BUN 8 - 23 mg/dL 25(H) 32(H) 36(H)  Creatinine 0.44 - 1.00 mg/dL 1.17(H) 1.64(H) 2.42(H)  Sodium 135 - 145 mmol/L 140 138 134(L)  Potassium 3.5 - 5.1 mmol/L 4.6 4.4 4.9  Chloride 98 - 111 mmol/L 113(H) 112(H) 108  CO2 22 - 32 mmol/L 23 21(L) 19(L)  Calcium 8.9 - 10.3 mg/dL 8.6(L) 8.5(L) 8.1(L)    LFT Hepatic Function Latest Ref Rng & Units 06/06/2021 06/05/2021 05/04/2020  Total Protein 6.5 - 8.1 g/dL 6.1(L) 6.4(L) 7.4  Albumin 3.5 - 5.0 g/dL 3.0(L) 3.2(L) 3.6  AST 15 - 41 U/L 37 69(H) 22  ALT 0 - 44 U/L 46(H) 50(H) 21  Alk Phosphatase 38 - 126 U/L 101 106 138(H)  Total Bilirubin 0.3 - 1.2 mg/dL 0.7 1.0 0.8  Bilirubin, Direct 0.1 - 0.5 mg/dL - - -     STUDIES: CT ABDOMEN PELVIS WO CONTRAST  Result Date: 06/17/2021 CLINICAL DATA:  60 year old female with abdominal pain. EXAM: CT ABDOMEN AND PELVIS WITHOUT CONTRAST  TECHNIQUE: Multidetector CT imaging of the abdomen and pelvis was performed following the standard protocol without IV contrast. COMPARISON:  CT abdomen pelvis dated 05/04/2020. FINDINGS: Evaluation of this exam is limited in the absence of intravenous contrast. Lower chest: The visualized lung bases are clear. No intra-abdominal free air or free fluid. Hepatobiliary: The liver is unremarkable. No intrahepatic biliary ductal dilatation. Cholecystectomy. Pancreas: Unremarkable. No pancreatic ductal dilatation or surrounding inflammatory changes. Spleen: Normal in size without focal abnormality. Adrenals/Urinary Tract: There is a 14 mm left adrenal adenoma. Indeterminate right adrenal nodule measures 24 mm. There is no hydronephrosis or nephrolithiasis on either side. The visualized ureters and urinary bladder appear unremarkable. Stomach/Bowel: Postsurgical changes of gastric bypass. There is a small hiatal hernia containing the anastomotic suture similar to prior CT. There is no bowel obstruction. The appendix is not visualized with certainty. No inflammatory changes identified in the right lower quadrant. Vascular/Lymphatic: The abdominal aorta and IVC are unremarkable. No portal venous gas. There is no adenopathy. Reproductive: Hysterectomy. No adnexal masses. Other: Ventral hernia repair mesh. Musculoskeletal: Osteopenia with degenerative changes of the spine. L5-S1 posterior fusion. No acute osseous pathology. Spinal stimulator with battery pack in the right gluteal subcutaneous soft tissues. IMPRESSION: 1. No acute intra-abdominal or pelvic pathology. 2. Postsurgical changes of gastric bypass. Small hiatal hernia containing the anastomotic suture similar to prior CT. No bowel obstruction. Electronically Signed   By: Anner Crete M.D.   On: 06/17/2021 17:48   DG Chest 2 View  Result Date: 06/17/2021 CLINICAL DATA:  Chest pain.  Shortness of breath.  Dry cough. EXAM: CHEST - 2 VIEW COMPARISON:  One-view  chest x-ray 06/05/2021 FINDINGS: Is mildly enlarged. Previously seen airspace disease has resolved. No edema or effusion is present. Spinal cord stimulator noted. Other lines or tubes are present. IMPRESSION: Mild cardiomegaly without failure. Electronically Signed   By: San Morelle M.D.   On: 06/17/2021 14:11   NM Pulmonary Perfusion  Addendum Date: 06/18/2021   ADDENDUM REPORT: 06/18/2021 10:52 ADDENDUM: Critical Value/emergent results were called by telephone at the time of interpretation on 06/18/2021 at 10:52 am to provider DR. GRUNZ, who verbally acknowledged these results. Electronically Signed   By: Ilona Sorrel M.D.   On: 06/18/2021 10:52   Result Date: 06/18/2021 CLINICAL DATA:  Chest pain and dyspnea.  Recent hospitalization. EXAM: NUCLEAR MEDICINE PERFUSION LUNG SCAN TECHNIQUE: Perfusion images were obtained in  multiple projections after intravenous injection of radiopharmaceutical. Ventilation scans intentionally deferred if perfusion scan and chest x-ray adequate for interpretation during COVID 19 epidemic. RADIOPHARMACEUTICALS:  4.2 mCi Tc-67m MAA IV COMPARISON:  Chest radiograph from one day prior. 12/23/2016 V/Q scan. FINDINGS: Multiple moderate perfusion defects throughout the right greater than left upper lobes bilaterally, without chest radiographic correlate, new from 2018 V/Q scan. IMPRESSION: Multiple moderate perfusion defects throughout the right greater than left upper lobes, without chest radiograph correlate, new from 2018 V/Q scan, moderate to high suspicion for acute pulmonary embolism. Electronically Signed: By: Ilona Sorrel M.D. On: 06/18/2021 10:34   US Venous Img Lower Bilateral (DVT)  Result Date: 06/18/2021 CLINICAL DATA:  COVID-19 infection, elevated D-dimer, renal insufficiency and V/Q scan demonstrating probable evidence of pulmonary embolism. EXAM: BILATERAL LOWER EXTREMITY VENOUS DOPPLER ULTRASOUND TECHNIQUE: Gray-scale sonography with graded compression, as  well as color Doppler and duplex ultrasound were performed to evaluate the lower extremity deep venous systems from the level of the common femoral vein and including the common femoral, femoral, profunda femoral, popliteal and calf veins including the posterior tibial, peroneal and gastrocnemius veins when visible. The superficial great saphenous vein was also interrogated. Spectral Doppler was utilized to evaluate flow at rest and with distal augmentation maneuvers in the common femoral, femoral and popliteal veins. COMPARISON:  None. FINDINGS: RIGHT LOWER EXTREMITY Common Femoral Vein: No evidence of thrombus. Normal compressibility, respiratory phasicity and response to augmentation. Saphenofemoral Junction: No evidence of thrombus. Normal compressibility and flow on color Doppler imaging. Profunda Femoral Vein: No evidence of thrombus. Normal compressibility and flow on color Doppler imaging. Femoral Vein: No evidence of thrombus. Normal compressibility, respiratory phasicity and response to augmentation. Popliteal Vein: No evidence of thrombus. Normal compressibility, respiratory phasicity and response to augmentation. Calf Veins: No evidence of thrombus. Normal compressibility and flow on color Doppler imaging. Superficial Great Saphenous Vein: No evidence of thrombus. Normal compressibility. Venous Reflux:  None. Other Findings: No evidence of superficial thrombophlebitis or abnormal fluid collection. LEFT LOWER EXTREMITY Common Femoral Vein: No evidence of thrombus. Normal compressibility, respiratory phasicity and response to augmentation. Saphenofemoral Junction: No evidence of thrombus. Normal compressibility and flow on color Doppler imaging. Profunda Femoral Vein: No evidence of thrombus. Normal compressibility and flow on color Doppler imaging. Femoral Vein: No evidence of thrombus. Normal compressibility, respiratory phasicity and response to augmentation. Popliteal Vein: No evidence of thrombus.  Normal compressibility, respiratory phasicity and response to augmentation. Calf Veins: No evidence of thrombus. Normal compressibility and flow on color Doppler imaging. Superficial Great Saphenous Vein: No evidence of thrombus. Normal compressibility. Venous Reflux:  None. Other Findings: No evidence of superficial thrombophlebitis or abnormal fluid collection. IMPRESSION: No evidence of deep venous thrombosis in either lower extremity. Electronically Signed   By: Aletta Edouard M.D.   On: 06/18/2021 12:01      Impression / Plan:   Manisha Cancel is a 60 y.o. female with history of metabolic syndrome, gastric bypass, COPD, CHF is admitted with cough, shortness of breath, chest pain, VQ scan with high suspicion for pulmonary embolism.  She also presented with 3 days of epigastric pain and melena with no rise in BUN/creatinine or drop in hemoglobin.  Patient had past history of iron deficiency anemia which has currently resolved.  She also underwent thorough endoscopy evaluation which was otherwise unremarkable. Given new onset of pulmonary embolism, no contraindication to start therapeutic anticoagulation as patient is not actively bleeding nor she has hemodynamically significant GI bleed Check iron  panel, B12 and folate levels Empirically start pantoprazole drip as we are starting therapeutic anticoagulation Defer endoscopic evaluation at this time unless patient develops active GI bleed  Monitor CBC daily Diet as tolerated Avoid NSAID use, follow-up with Dr. Alice Reichert upon discharge  Thank you for involving me in the care of this patient.      LOS: 0 days   Sherri Sear, MD  06/18/2021, 2:06 PM    Note: This dictation was prepared with Dragon dictation along with smaller phrase technology. Any transcriptional errors that result from this process are unintentional.

## 2021-06-18 NOTE — ED Notes (Signed)
Pt care taken, pt resting, no complaints at this time, request her night meds closer to 9 pm than 10

## 2021-06-18 NOTE — Progress Notes (Signed)
PROGRESS NOTE  Lindsey [name]  PYP:[id number] DOB: [birth year] DOA: 06/17/2021 PCP: Jodi Marble, MD   Brief Narrative: Lindsey [name] is a 60 y.o. female with a history of PUD, GERD, morbid obesity s/p Roux-en-Y gastric bypass 2006, CVA, CAD s/p MI, IDT2DM, HTN, HLD and chronic pain and recent admission for suspicion of unintentional opioid overdose requiring intubation who presented to the ED 7/22 with chest pain, dyspnea, cough, and epigastric abdominal pain as well as a few days of melenotic stools. Vital signs were stable, hgb 13g/dl, troponin negative x2, d-dimer elevated to 0.99, and SARS-CoV-2 PCR was positive. CXR showed cardiomegaly without infiltrates. CT abd/pelvis revealed no acute findings, stable hiatal hernia containing anastomotic suture. V/Q scan (due to contrast allergy) showed multiple perfusion defects consistent with pulmonary emboli for which heparin is started. GI has been consulted.  Assessment & Plan: Active Problems:   Dyspnea   Acute pulmonary embolism without acute cor pulmonale (HCC)  Acute PEs: Based on V/Q scan. No DVT on LE venous U/S.  - Check echocardiogram, though no hemodynamic suggestion of cor pulmonale.  - Initiate heparin gtt in order to be able to stop if GI bleed develops.   Abdominal pain in patient s/p gastric bypass with reported melena: CT abd/pelvis reassuring. Fortunately no anemia. - GI consulted - Analgesics per chronic pain regimen. Will attempt to avoid oversedation as pt was recently intubated due to AMS though to be due to this.  - PPI gtt due to melena and hx PUD/GERD. Avoid NSAIDs.   Covid-19 infection: In fully vaccinated patient. Likely underlying precipitant of VTE. Remains dyspneic at rest, though no evidence of pneumonia at this time.  - Isolation x10 days - With borderline renal function and multiple medications (particularly benzodiazepine dependence), not an ideal candidate for paxlovid. Having been triple vaccinated,  antibody therapy may not be very effective. Will give remdesivir x3 doses if LFTs.   IDT2DM:  - Restart basal insulin at 1/2 dose given anticipate diet restrictions, start SSI and monitor.  - Linagliptin  CAD: Negative troponin, no ischemic ECG changes noted.  - Not on aspirin. Holding BB with bradycardia. Continue statin  HTN: Normotensive currently.  - Restart home medications if/when BP becomes elevated.   Anxiety:  - Continue buspar, very low dose xanax  HLD:  - Continue statin  Obesity: Estimated body mass index is 39.16 kg/m as calculated from the following:   Height as of this encounter: 5\' 7"  (1.702 m).   Weight as of this encounter: 113.4 kg.  DVT prophylaxis: Heparin gtt Code Status: Full Family Communication: None at bedside Disposition Plan:  Status is: Inpatient  Remains inpatient appropriate because:Ongoing diagnostic testing needed not appropriate for outpatient work up and IV treatments appropriate due to intensity of illness or inability to take PO  Dispo: The patient is from: Home              Anticipated d/c is to: Home              Patient currently is not medically stable to d/c.   Difficult to place patient No  Consultants:  GI  Procedures:  None  Antimicrobials: Remdesivir   Subjective: Still having lower abdominal and (separate) epigastric abdominal pain that are severe, improved with the fentanyl she received last night, not improved with multiple doses of tramadol. Dyspnea is moderate, present at rest. Nausea without vomiting or diarrhea.  Objective: Vitals:   06/18/21 1045 06/18/21 1100 06/18/21 1245 06/18/21 1300  BP:  121/73    Pulse: 62 (!) 59 (!) 54 (!) 104  Resp: 15 13 19 15   Temp:      TempSrc:      SpO2: 95% 95% 93% 100%  Weight:      Height:        Intake/Output Summary (Last 24 hours) at 06/18/2021 1345 Last data filed at 06/18/2021 1128 Gross per 24 hour  Intake 126.78 ml  Output --  Net 126.78 ml   Filed Weights    06/17/21 1220  Weight: 113.4 kg    Gen: 60 y.o. female in no distress Pulm: Tachypnea without wheezes or crackles.  CV: Regular bradycardia. No murmur, rub, or gallop. No JVD, no pitting pedal edema. GI: Abdomen soft, tender in epigastrium, not elsewhere. Non-distended, with normoactive bowel sounds. No organomegaly or masses felt. Ext: Warm, no deformities Skin: No rashes, lesions or ulcers on visualized skin. Neuro: Alert and oriented. No focal neurological deficits. Psych: Judgement and insight appear normal. Mood & affect appropriate.   Data Reviewed: I have personally reviewed following labs and imaging studies  CBC: Recent Labs  Lab 06/17/21 1452  WBC 8.2  HGB 13.0  HCT 40.7  MCV 89.1  PLT 397   Basic Metabolic Panel: Recent Labs  Lab 06/17/21 1452  NA 140  K 4.6  CL 113*  CO2 23  GLUCOSE 178*  BUN 25*  CREATININE 1.17*  CALCIUM 8.6*   GFR: Estimated Creatinine Clearance: 64.8 mL/min (A) (by C-G formula based on SCr of 1.17 mg/dL (H)).  Urine analysis:    Component Value Date/Time   COLORURINE YELLOW (A) 06/05/2021 1742   APPEARANCEUR HAZY (A) 06/05/2021 1742   APPEARANCEUR CLEAR 06/29/2014 0028   LABSPEC 1.026 06/05/2021 1742   LABSPEC 1.025 06/29/2014 0028   PHURINE 5.0 06/05/2021 1742   GLUCOSEU >=500 (A) 06/05/2021 1742   GLUCOSEU see comment 06/29/2014 0028   HGBUR NEGATIVE 06/05/2021 1742   BILIRUBINUR NEGATIVE 06/05/2021 1742   BILIRUBINUR see comment 06/29/2014 0028   KETONESUR NEGATIVE 06/05/2021 1742   PROTEINUR 30 (A) 06/05/2021 1742   NITRITE NEGATIVE 06/05/2021 1742   LEUKOCYTESUR NEGATIVE 06/05/2021 1742   LEUKOCYTESUR see comment 06/29/2014 0028   Recent Results (from the past 240 hour(s))  Resp Panel by RT-PCR (Flu A&B, Covid) Nasopharyngeal Swab     Status: Abnormal   Collection Time: 06/17/21  8:52 PM   Specimen: Nasopharyngeal Swab; Nasopharyngeal(NP) swabs in vial transport medium  Result Value Ref Range Status   SARS  Coronavirus 2 by RT PCR POSITIVE (A) NEGATIVE Final    Comment: RESULT CALLED TO, READ BACK BY AND VERIFIED WITH: LEXI OLIVER @ 2154 06/17/21 LFD (NOTE) SARS-CoV-2 target nucleic acids are DETECTED.  The SARS-CoV-2 RNA is generally detectable in upper respiratory specimens during the acute phase of infection. Positive results are indicative of the presence of the identified virus, but do not rule out bacterial infection or co-infection with other pathogens not detected by the test. Clinical correlation with patient history and other diagnostic information is necessary to determine patient infection status. The expected result is Negative.  Fact Sheet for Patients: EntrepreneurPulse.com.au  Fact Sheet for Healthcare Providers: IncredibleEmployment.be  This test is not yet approved or cleared by the [place] FDA and  has been authorized for detection and/or diagnosis of SARS-CoV-2 by FDA under an Emergency Use Authorization (EUA).  This EUA will remain in effect (meaning this test can be u sed) for the duration of  the COVID-19 declaration under Section  564(b)(1) of the Act, 21 U.S.C. section 360bbb-3(b)(1), unless the authorization is terminated or revoked sooner.     Influenza A by PCR NEGATIVE NEGATIVE Final   Influenza B by PCR NEGATIVE NEGATIVE Final    Comment: (NOTE) The Xpert Xpress SARS-CoV-2/FLU/RSV plus assay is intended as an aid in the diagnosis of influenza from Nasopharyngeal swab specimens and should not be used as a sole basis for treatment. Nasal washings and aspirates are unacceptable for Xpert Xpress SARS-CoV-2/FLU/RSV testing.  Fact Sheet for Patients: EntrepreneurPulse.com.au  Fact Sheet for Healthcare Providers: IncredibleEmployment.be  This test is not yet approved or cleared by the [place] FDA and has been authorized for detection and/or diagnosis of SARS-CoV-2 by FDA  under an Emergency Use Authorization (EUA). This EUA will remain in effect (meaning this test can be used) for the duration of the COVID-19 declaration under Section 564(b)(1) of the Act, 21 U.S.C. section 360bbb-3(b)(1), unless the authorization is terminated or revoked.  Performed at [facility], [address] [place], [place] [address]       Radiology Studies: CT ABDOMEN PELVIS WO CONTRAST  Result Date: 06/17/2021 CLINICAL DATA:  60 year old female with abdominal pain. EXAM: CT ABDOMEN AND PELVIS WITHOUT CONTRAST TECHNIQUE: Multidetector CT imaging of the abdomen and pelvis was performed following the standard protocol without IV contrast. COMPARISON:  CT abdomen pelvis dated 05/04/2020. FINDINGS: Evaluation of this exam is limited in the absence of intravenous contrast. Lower chest: The visualized lung bases are clear. No intra-abdominal free air or free fluid. Hepatobiliary: The liver is unremarkable. No intrahepatic biliary ductal dilatation. Cholecystectomy. Pancreas: Unremarkable. No pancreatic ductal dilatation or surrounding inflammatory changes. Spleen: Normal in size without focal abnormality. Adrenals/Urinary Tract: There is a 14 mm left adrenal adenoma. Indeterminate right adrenal nodule measures 24 mm. There is no hydronephrosis or nephrolithiasis on either side. The visualized ureters and urinary bladder appear unremarkable. Stomach/Bowel: Postsurgical changes of gastric bypass. There is a small hiatal hernia containing the anastomotic suture similar to prior CT. There is no bowel obstruction. The appendix is not visualized with certainty. No inflammatory changes identified in the right lower quadrant. Vascular/Lymphatic: The abdominal aorta and IVC are unremarkable. No portal venous gas. There is no adenopathy. Reproductive: Hysterectomy. No adnexal masses. Other: Ventral hernia repair mesh. Musculoskeletal: Osteopenia with degenerative changes of the spine. L5-S1  posterior fusion. No acute osseous pathology. Spinal stimulator with battery pack in the right gluteal subcutaneous soft tissues. IMPRESSION: 1. No acute intra-abdominal or pelvic pathology. 2. Postsurgical changes of gastric bypass. Small hiatal hernia containing the anastomotic suture similar to prior CT. No bowel obstruction. Electronically Signed   By: Anner Crete M.D.   On: 06/17/2021 17:48   DG Chest 2 View  Result Date: 06/17/2021 CLINICAL DATA:  Chest pain.  Shortness of breath.  Dry cough. EXAM: CHEST - 2 VIEW COMPARISON:  One-view chest x-ray 06/05/2021 FINDINGS: Is mildly enlarged. Previously seen airspace disease has resolved. No edema or effusion is present. Spinal cord stimulator noted. Other lines or tubes are present. IMPRESSION: Mild cardiomegaly without failure. Electronically Signed   By: San Morelle M.D.   On: 06/17/2021 14:11   NM Pulmonary Perfusion  Addendum Date: 06/18/2021   ADDENDUM REPORT: 06/18/2021 10:52 ADDENDUM: Critical Value/emergent results were called by telephone at the time of interpretation on 06/18/2021 at 10:52 am to provider DR. Angeligue Bowne, who verbally acknowledged these results. Electronically Signed   By: Ilona Sorrel M.D.   On: 06/18/2021 10:52   Result Date:  06/18/2021 CLINICAL DATA:  Chest pain and dyspnea.  Recent hospitalization. EXAM: NUCLEAR MEDICINE PERFUSION LUNG SCAN TECHNIQUE: Perfusion images were obtained in multiple projections after intravenous injection of radiopharmaceutical. Ventilation scans intentionally deferred if perfusion scan and chest x-ray adequate for interpretation during COVID 19 epidemic. RADIOPHARMACEUTICALS:  4.2 mCi Tc-71m MAA IV COMPARISON:  Chest radiograph from one day prior. 12/23/2016 V/Q scan. FINDINGS: Multiple moderate perfusion defects throughout the right greater than left upper lobes bilaterally, without chest radiographic correlate, new from 2018 V/Q scan. IMPRESSION: Multiple moderate perfusion defects  throughout the right greater than left upper lobes, without chest radiograph correlate, new from 2018 V/Q scan, moderate to high suspicion for acute pulmonary embolism. Electronically Signed: By: Ilona Sorrel M.D. On: 06/18/2021 10:34   US Venous Img Lower Bilateral (DVT)  Result Date: 06/18/2021 CLINICAL DATA:  COVID-19 infection, elevated D-dimer, renal insufficiency and V/Q scan demonstrating probable evidence of pulmonary embolism. EXAM: BILATERAL LOWER EXTREMITY VENOUS DOPPLER ULTRASOUND TECHNIQUE: Gray-scale sonography with graded compression, as well as color Doppler and duplex ultrasound were performed to evaluate the lower extremity deep venous systems from the level of the common femoral vein and including the common femoral, femoral, profunda femoral, popliteal and calf veins including the posterior tibial, peroneal and gastrocnemius veins when visible. The superficial great saphenous vein was also interrogated. Spectral Doppler was utilized to evaluate flow at rest and with distal augmentation maneuvers in the common femoral, femoral and popliteal veins. COMPARISON:  None. FINDINGS: RIGHT LOWER EXTREMITY Common Femoral Vein: No evidence of thrombus. Normal compressibility, respiratory phasicity and response to augmentation. Saphenofemoral Junction: No evidence of thrombus. Normal compressibility and flow on color Doppler imaging. Profunda Femoral Vein: No evidence of thrombus. Normal compressibility and flow on color Doppler imaging. Femoral Vein: No evidence of thrombus. Normal compressibility, respiratory phasicity and response to augmentation. Popliteal Vein: No evidence of thrombus. Normal compressibility, respiratory phasicity and response to augmentation. Calf Veins: No evidence of thrombus. Normal compressibility and flow on color Doppler imaging. Superficial Great Saphenous Vein: No evidence of thrombus. Normal compressibility. Venous Reflux:  None. Other Findings: No evidence of superficial  thrombophlebitis or abnormal fluid collection. LEFT LOWER EXTREMITY Common Femoral Vein: No evidence of thrombus. Normal compressibility, respiratory phasicity and response to augmentation. Saphenofemoral Junction: No evidence of thrombus. Normal compressibility and flow on color Doppler imaging. Profunda Femoral Vein: No evidence of thrombus. Normal compressibility and flow on color Doppler imaging. Femoral Vein: No evidence of thrombus. Normal compressibility, respiratory phasicity and response to augmentation. Popliteal Vein: No evidence of thrombus. Normal compressibility, respiratory phasicity and response to augmentation. Calf Veins: No evidence of thrombus. Normal compressibility and flow on color Doppler imaging. Superficial Great Saphenous Vein: No evidence of thrombus. Normal compressibility. Venous Reflux:  None. Other Findings: No evidence of superficial thrombophlebitis or abnormal fluid collection. IMPRESSION: No evidence of deep venous thrombosis in either lower extremity. Electronically Signed   By: Aletta Edouard M.D.   On: 06/18/2021 12:01    Scheduled Meds:  ALPRAZolam  0.25 mg Oral TID   [START ON 06/19/2021] atorvastatin  40 mg Oral Daily   busPIRone  7.5 mg Oral BID   fluticasone  1 spray Each Nare Daily   folic acid  1 mg Oral Daily   heparin  5,000 Units Intravenous Once   Ipratropium-Albuterol  1 puff Inhalation TID   linagliptin  5 mg Oral Daily   [START ON 06/19/2021] loratadine  10 mg Oral Daily   mirtazapine  45 mg Oral QHS  oxybutynin  5 mg Oral BID   oxyCODONE  20 mg Oral QID   Continuous Infusions:  heparin     pantoprazole Stopped (06/18/21 1338)   remdesivir 100 mg in NS 100 mL Stopped (06/18/21 1240)     LOS: 0 days   Time spent: 35 minutes in seeing, examining patient, coordinating patient care, greater than 50% of which was spent at the bedside.   Patrecia Pour, MD [facility] [email or web address] 06/18/2021, 1:45 PM

## 2021-06-18 NOTE — ED Provider Notes (Addendum)
Due to difficulty with obtaining IV access, a 20G peripheral IV catheter was inserted using US guidance into the Lindsey [name].  The site was prepped with chlorhexidine and allowed to dry.  The patient tolerated the procedure without any complications.    Merlyn Lot, MD 06/18/21 1539    Merlyn Lot, MD 06/18/21 1539

## 2021-06-18 NOTE — ED Notes (Signed)
IV team at bedside 

## 2021-06-18 NOTE — ED Notes (Signed)
Restarted heparin

## 2021-06-18 NOTE — ED Notes (Signed)
US at bedside

## 2021-06-18 NOTE — ED Notes (Signed)
IV team unable to obtain second line 

## 2021-06-18 NOTE — ED Notes (Signed)
Messaged pharmacy to get remdesivir sent to ER.

## 2021-06-18 NOTE — ED Notes (Signed)
Pt taken to NM 

## 2021-06-18 NOTE — Consult Note (Signed)
[facility] for IV Heparin Indication: pulmonary embolus  Patient Measurements: Heparin Dosing Weight: 87.9 kg  Labs: Recent Labs    06/17/21 1452 06/17/21 1930  HGB 13.0  --   HCT 40.7  --   PLT 384  --   CREATININE 1.17*  --   TROPONINIHS 3 3    Estimated Creatinine Clearance: 64.8 mL/min (A) (by C-G formula based on SCr of 1.17 mg/dL (H)).   Medical History: Past Medical History:  Diagnosis Date   Allergy    Anemia    Arthritis    knees   COPD (chronic obstructive pulmonary disease) ([place])    Diabetes mellitus without complication (HCC)    diet controlled   GERD (gastroesophageal reflux disease)    Heart attack ([place]) 2002   Hyperlipidemia    Hypertension    PUD (peptic ulcer disease)    Sleep apnea    can't tolerate CPAP.  Uses O2 only   Spinal cord stimulator status    for lower back pain   Stroke [facility]) 2004   "mini-stroke"   Vertigo    Wears dentures    full upper and lower    Medications:  No anticoagulation prior to admission per my chart review  Assessment: Patient is a 60 y/o F with medical history as above who is admitted with COVID-19 infection, chest pain, and melena possibly secondary to peptic ulcer which she has a history of. VQ scan with moderate-high suspicion for PE. Patient is hemodynamically stable but bradycardic. Pharmacy consulted to initiate heparin infusion for suspected PE.  Baseline CBC with H&H, platelets within normal limits. Baseline aPTT and PT-INR are pending.   Goal of Therapy:  Heparin level 0.3-0.7 units/ml Monitor platelets by anticoagulation protocol: Yes   Plan:  --Heparin 5000 unit IV bolus followed by continuous infusion at 1400 units/hr --Will check HL 6 hours after initiation of infusion --Daily CBC per protocol --Monitor closely for active GIB, worsening anemia given presentation / history  Benita Gutter 06/18/2021,11:10 AM

## 2021-06-18 NOTE — ED Notes (Signed)
Called by pharmacy to hold for 1 hour.

## 2021-06-18 NOTE — ED Notes (Signed)
Pt states she is hard stick and was stuck 8 times before IV team was able to get IV. IV team consult with blood draw ordered at this time to start another line in able to be able to start pt on heparin. Pt verbalized understanding.

## 2021-06-18 NOTE — ED Notes (Signed)
Per MD can advance diet as tolerated. Pt provided snack at this time. Has water at bedside. Still waiting on IV team.

## 2021-06-18 NOTE — ED Notes (Signed)
Provided supper tray to pt.

## 2021-06-18 NOTE — ED Notes (Signed)
Dr. Quentin Cornwall at bedside with [place] to attempt to obtain another IV

## 2021-06-18 NOTE — ED Notes (Signed)
Pt ambulatory to toilet with steady gait.  

## 2021-06-18 NOTE — ED Notes (Signed)
MD at bedside. 

## 2021-06-18 NOTE — ED Notes (Signed)
This RN paused protonix until second IV obtained by IV team, will start heparin through current IV.

## 2021-06-19 ENCOUNTER — Inpatient Hospital Stay (HOSPITAL_COMMUNITY)
Admit: 2021-06-19 | Discharge: 2021-06-19 | Disposition: A | Discharge status: Home / Self Care | Payer: Medicare Other | Attending: Family Medicine | Admitting: Family Medicine

## 2021-06-19 DIAGNOSIS — I2699 Other pulmonary embolism without acute cor pulmonale: Secondary | ICD-10-CM | POA: Diagnosis not present

## 2021-06-19 DIAGNOSIS — R079 Chest pain, unspecified: Secondary | ICD-10-CM

## 2021-06-19 LAB — BASIC METABOLIC PANEL
Anion gap: 6 (ref 5–15)
BUN: 21 mg/dL (ref 8–23)
CO2: 24 mmol/L (ref 22–32)
Calcium: 8.7 mg/dL — ABNORMAL LOW (ref 8.9–10.3)
Chloride: 109 mmol/L (ref 98–111)
Creatinine, Ser: 1.37 mg/dL — ABNORMAL HIGH (ref 0.44–1.00)
GFR, Estimated: 44 mL/min — ABNORMAL LOW (ref >60)
Glucose, Bld: 99 mg/dL (ref 70–99)
Potassium: 4.2 mmol/L (ref 3.5–5.1)
Sodium: 139 mmol/L (ref 135–145)

## 2021-06-19 LAB — HEPARIN LEVEL (UNFRACTIONATED)
Heparin Unfractionated: 0.8 IU/mL — ABNORMAL HIGH (ref 0.30–0.70)
Heparin Unfractionated: 0.39 IU/mL (ref 0.30–0.70)
Heparin Unfractionated: 0.32 IU/mL (ref 0.30–0.70)

## 2021-06-19 LAB — IRON AND TIBC
Iron: 92 ug/dL (ref 28–170)
Saturation Ratios: 34 % — ABNORMAL HIGH (ref 10.4–31.8)
TIBC: 273 ug/dL (ref 250–450)
UIBC: 181 ug/dL

## 2021-06-19 LAB — ECHOCARDIOGRAM COMPLETE
AR max vel: 3.49 cm2
AV Peak grad: 7.4 mmHg
Ao pk vel: 1.36 m/s
Area-P 1/2: 4.49 cm2
Height: 67 in
S' Lateral: 3.12 cm
Weight: 4000 oz

## 2021-06-19 LAB — HEPATIC FUNCTION PANEL
ALT: 27 U/L (ref 0–44)
AST: 26 U/L (ref 15–41)
Albumin: 2.8 g/dL — ABNORMAL LOW (ref 3.5–5.0)
Alkaline Phosphatase: 90 U/L (ref 38–126)
Bilirubin, Direct: <0.1 mg/dL (ref 0.0–0.2)
Total Bilirubin: 0.7 mg/dL (ref 0.3–1.2)
Total Protein: 5.7 g/dL — ABNORMAL LOW (ref 6.5–8.1)

## 2021-06-19 LAB — FOLATE: Folate: 47 ng/mL (ref >5.9)

## 2021-06-19 LAB — C-REACTIVE PROTEIN: CRP: 0.6 mg/dL (ref <1.0)

## 2021-06-19 LAB — CBC
HCT: 37.2 % (ref 36.0–46.0)
Hemoglobin: 11.9 g/dL — ABNORMAL LOW (ref 12.0–15.0)
MCH: 28.5 pg (ref 26.0–34.0)
MCHC: 32 g/dL (ref 30.0–36.0)
MCV: 89.2 fL (ref 80.0–100.0)
Platelets: 310 10*3/uL (ref 150–400)
RBC: 4.17 MIL/uL (ref 3.87–5.11)
RDW: 14.3 % (ref 11.5–15.5)
WBC: 7.5 10*3/uL (ref 4.0–10.5)
nRBC: 0 % (ref 0.0–0.2)

## 2021-06-19 LAB — VITAMIN B12: Vitamin B-12: 195 pg/mL (ref 180–914)

## 2021-06-19 LAB — GLUCOSE, CAPILLARY
Glucose-Capillary: 85 mg/dL (ref 70–99)
Glucose-Capillary: 172 mg/dL — ABNORMAL HIGH (ref 70–99)
Glucose-Capillary: 130 mg/dL — ABNORMAL HIGH (ref 70–99)
Glucose-Capillary: 146 mg/dL — ABNORMAL HIGH (ref 70–99)

## 2021-06-19 LAB — FERRITIN: Ferritin: 396 ng/mL — ABNORMAL HIGH (ref 11–307)

## 2021-06-19 MED ORDER — OXYCODONE HCL 5 MG PO TABS
20.0000 mg | ORAL_TABLET | Freq: Four times a day (QID) | ORAL | Status: DC
  Administered 2021-06-19 (×2): 20 mg via ORAL
  Filled 2021-06-19 (×2): qty 4

## 2021-06-19 MED ORDER — PANTOPRAZOLE SODIUM 40 MG PO TBEC
40.0000 mg | DELAYED_RELEASE_TABLET | Freq: Two times a day (BID) | ORAL | Status: DC
  Administered 2021-06-19 – 2021-06-22 (×6): 40 mg via ORAL
  Filled 2021-06-19 (×6): qty 1

## 2021-06-19 MED ORDER — OXYCODONE HCL 5 MG PO TABS
20.0000 mg | ORAL_TABLET | Freq: Four times a day (QID) | ORAL | Status: DC
Start: 2021-06-19 — End: 2021-06-22
  Administered 2021-06-19 – 2021-06-22 (×13): 20 mg via ORAL
  Filled 2021-06-19 (×13): qty 4

## 2021-06-19 MED ORDER — SODIUM CHLORIDE 0.9 % IV SOLN
INTRAVENOUS | Status: DC | PRN
Start: 2021-06-19 — End: 2021-06-22
  Administered 2021-06-19: 250 mL via INTRAVENOUS

## 2021-06-19 MED ORDER — OXYCODONE HCL 5 MG PO TABS: 20.0000 mg | ORAL_TABLET | Freq: Four times a day (QID) | ORAL | Status: DC

## 2021-06-19 MED ORDER — METOPROLOL SUCCINATE ER 100 MG PO TB24
100.0000 mg | ORAL_TABLET | Freq: Every day | ORAL | Status: DC
  Administered 2021-06-19 – 2021-06-22 (×4): 100 mg via ORAL
  Filled 2021-06-19 (×4): qty 1

## 2021-06-19 NOTE — Consult Note (Signed)
[facility] for IV Heparin Indication: pulmonary embolus  Patient Measurements: Heparin Dosing Weight: 87.9 kg  Labs: Recent Labs    06/17/21 1452 06/17/21 1930 06/18/21 1336 06/18/21 2009 06/19/21 0731  HGB 13.0  --   --   --  11.9*  HCT 40.7  --   --   --  37.2  PLT 384  --   --   --  310  APTT  --   --  26  --   --   LABPROT  --   --  13.7  --   --   INR  --   --  1.1  --   --   HEPARINUNFRC  --   --   --  >1.10* 0.80*  CREATININE 1.17*  --   --   --  1.37*  TROPONINIHS 3 3  --   --   --      Estimated Creatinine Clearance: 55.3 mL/min (A) (by C-G formula based on SCr of 1.37 mg/dL (H)).   Medical History: Past Medical History:  Diagnosis Date   Allergy    Anemia    Arthritis    knees   COPD (chronic obstructive pulmonary disease) ([place])    Diabetes mellitus without complication (HCC)    diet controlled   GERD (gastroesophageal reflux disease)    Heart attack ([place]) 2002   Hyperlipidemia    Hypertension    PUD (peptic ulcer disease)    Sleep apnea    can't tolerate CPAP.  Uses O2 only   Spinal cord stimulator status    for lower back pain   Stroke [facility]) 2004   "mini-stroke"   Vertigo    Wears dentures    full upper and lower    Medications:  No anticoagulation prior to admission per my chart review  Assessment: Patient is a 60 y/o F with medical history as above who is admitted with COVID-19 infection, chest pain, and melena possibly secondary to peptic ulcer which she has a history of. VQ scan with moderate-high suspicion for PE. Patient is hemodynamically stable but bradycardic. Per GI note, no contraindication to start therapeutic anticoagulation as patient is not actively bleeding nor does she has hemodynamically significant GI bleed. Pharmacy consulted to initiate heparin infusion for suspected PE.   7/23 @2009  HL >1.10 - spoke with nurse and confirmed lab was not drawn from same arm as heparin infusion 7/24  @0731  HL 0.8 - supratherapeutic  Goal of Therapy:  Heparin level 0.3-0.7 units/ml Monitor platelets by anticoagulation protocol: Yes   Plan:  Heparin level is supratherapeutic. Will decrease heparin infusion to 850 units/hr. Recheck heparin level in 6 hours. CBC daily while on heparin.  Monitor closely for active GIB, worsening anemia given presentation / history  Oswald Hillock, PharmD 06/19/2021,8:10 AM

## 2021-06-19 NOTE — Progress Notes (Signed)
Attempted restart of 2nd site PIV x 3 without success.. Assessed both arms with and used ultrasound to attempt. Veins are small and deep. Unable to find suitable vein for piv or midline placement at this time. Patients RN to notify MD

## 2021-06-19 NOTE — Consult Note (Signed)
[place] for IV Heparin Indication: pulmonary embolus  Patient Measurements: Heparin Dosing Weight: 87.9 kg  Labs: Recent Labs    06/17/21 1452 06/17/21 1930 06/18/21 1336 06/18/21 2009 06/19/21 0731 06/19/21 1525  HGB 13.0  --   --   --  11.9*  --   HCT 40.7  --   --   --  37.2  --   PLT 384  --   --   --  310  --   APTT  --   --  26  --   --   --   LABPROT  --   --  13.7  --   --   --   INR  --   --  1.1  --   --   --   HEPARINUNFRC  --   --   --  >1.10* 0.80* 0.39  CREATININE 1.17*  --   --   --  1.37*  --   TROPONINIHS 3 3  --   --   --   --      Estimated Creatinine Clearance: 55.3 mL/min (A) (by C-G formula based on SCr of 1.37 mg/dL (H)).   Medical History: Past Medical History:  Diagnosis Date   Allergy    Anemia    Arthritis    knees   COPD (chronic obstructive pulmonary disease) ([place])    Diabetes mellitus without complication (HCC)    diet controlled   GERD (gastroesophageal reflux disease)    Heart attack ([place]) 2002   Hyperlipidemia    Hypertension    PUD (peptic ulcer disease)    Sleep apnea    can't tolerate CPAP.  Uses O2 only   Spinal cord stimulator status    for lower back pain   Stroke [facility]) 2004   "mini-stroke"   Vertigo    Wears dentures    full upper and lower    Medications:  No anticoagulation prior to admission per my chart review  Assessment: Patient is a 60 y/o F with medical history as above who is admitted with COVID-19 infection, chest pain, and melena possibly secondary to peptic ulcer which she has a history of. VQ scan with moderate-high suspicion for PE. Patient is hemodynamically stable but bradycardic. Per GI note, no contraindication to start therapeutic anticoagulation as patient is not actively bleeding nor does she has hemodynamically significant GI bleed. Pharmacy consulted to initiate heparin infusion for suspected PE.   7/23 @2009  HL >1.10 - spoke with nurse and confirmed  lab was not drawn from same arm as heparin infusion 7/24 @0731  HL 0.8 - supratherapeutic 7/24 @1525  HL 0.39 - therapeutic  Goal of Therapy:  Heparin level 0.3-0.7 units/ml Monitor platelets by anticoagulation protocol: Yes   Plan:  HL is therapeutic. Will continue heparin infusion at 850 units/hr. Recheck heparin level in 6 hours to confirm.  CBC daily while on heparin.  Monitor closely for active GIB, worsening anemia given presentation / history  Sherilyn Banker, PharmD 06/19/2021,4:30 PM

## 2021-06-19 NOTE — Progress Notes (Signed)
PROGRESS NOTE  Lindsey [name]  ION:[id number] DOB: [birth year] DOA: 06/17/2021 PCP: Jodi Marble, MD   Brief Narrative: Lindsey [name] is a 60 y.o. female with a history of PUD, GERD, morbid obesity s/p Roux-en-Y gastric bypass 2006, CVA, CAD s/p MI, IDT2DM, HTN, HLD and chronic pain and recent admission for suspicion of unintentional opioid overdose requiring intubation who presented to the ED 7/22 with chest pain, dyspnea, cough, and epigastric abdominal pain as well as a few days of melenotic stools. Vital signs were stable, hgb 13g/dl, troponin negative x2, d-dimer elevated to 0.99, and SARS-CoV-2 PCR was positive. CXR showed cardiomegaly without infiltrates. CT abd/pelvis revealed no acute findings, stable hiatal hernia containing anastomotic suture. V/Q scan (due to contrast allergy) showed multiple perfusion defects consistent with pulmonary emboli for which heparin is started. GI is recommending deferring endoscopic evaluation at this time.   Assessment & Plan: Active Problems:   Dyspnea   Acute pulmonary embolism without acute cor pulmonale (HCC)  Acute PEs: Based on V/Q scan. No DVT on LE venous U/S.  - Check echocardiogram - Continue heparin. Levels supratherapeutic, will continue monitoring and dose adjusting per pharmacy.  Abdominal pain in patient s/p gastric bypass with reported melena: CT abd/pelvis reassuring. Fortunately no anemia. - GI consulted, deferring endoscopic evaluation at this time.  - Analgesics per chronic pain regimen. Will attempt to avoid oversedation as pt was recently intubated due to AMS though to be due to this.  - PPI gtt continued. Avoid NSAIDs.   Acute blood loss anemia: Hgb down from admission.  - Recheck CBC in AM or prn bleeding. Hemodynamically stable. BUN remains elevated.   Covid-19 infection: In fully vaccinated patient. Likely underlying precipitant of VTE. Remains dyspneic at rest, though no evidence of pneumonia at this time.  -  Isolation x10 days - With borderline renal function and multiple medications (particularly benzodiazepine dependence), not an ideal candidate for paxlovid. Having been triple vaccinated, antibody therapy may not be very effective. Will give remdesivir x3 doses if LFTs.   IDT2DM:  - Restarted basal insulin at 1/2 dose, Fasting CBG 85. No changes. Continue SSI. - Linagliptin  CAD: Negative troponin, no ischemic ECG changes noted.  - Not on aspirin. Continue BB. Continue statin  HTN: Normotensive currently.  - Restart home medications if/when BP becomes elevated. Remains normotensive.  Anxiety:  - Continue buspar, very low dose xanax  HLD:  - Continue statin  Obesity: Estimated body mass index is 39.16 kg/m as calculated from the following:   Height as of this encounter: 5\' 7"  (1.702 m).   Weight as of this encounter: 113.4 kg.  DVT prophylaxis: Heparin gtt Code Status: Full Family Communication: None at bedside Disposition Plan:  Status is: Inpatient  Remains inpatient appropriate because:Ongoing diagnostic testing needed not appropriate for outpatient work up and IV treatments appropriate due to intensity of illness or inability to take PO  Dispo: The patient is from: Home              Anticipated d/c is to: Home              Patient currently is not medically stable to d/c.   Difficult to place patient No  Consultants:  GI  Procedures:  None  Antimicrobials: Remdesivir (7/22 - 7/26)  Subjective: No BM since arrival. Pain in abdomen is stable, in epigastrium, severe, improved with oxycodone, not limited po intake. She has not noticed other signs of bleeding either. Her only concern is that the  pain medications 4 times per day are not being given at the same times as they are at home (6 and 12).   Objective: Vitals:   06/19/21 0358 06/19/21 0538 06/19/21 0610 06/19/21 0843  BP: (!) 143/95 117/74  124/69  Pulse: 78 78  80  Resp: 18 16  16   Temp: 98.2 F (36.8 C)   98.1 F (36.7 C) 98.2 F (36.8 C)  TempSrc:   Oral Oral  SpO2: 96% 98%  93%  Weight:      Height:        Intake/Output Summary (Last 24 hours) at 06/19/2021 1238 Last data filed at 06/19/2021 1038 Gross per 24 hour  Intake 230.77 ml  Output --  Net 230.77 ml   Filed Weights   06/17/21 1220  Weight: 113.4 kg   Gen: 60 y.o. female in no distress Pulm: Nonlabored breathing room air. Clear. CV: Regular tachycardia without murmur, rub, or gallop. No JVD, no dependent edema. GI: Abdomen soft, stable tenderness in the epigastrium without guarding, non-distended, with normoactive bowel sounds.  Ext: Warm, no deformities Skin: No new rashes, lesions or ulcers on visualized skin. No bleeding sources. Neuro: Alert and oriented. No focal neurological deficits. Psych: Judgement and insight appear fair. Mood euthymic & affect congruent. Behavior is appropriate.    Data Reviewed: I have personally reviewed following labs and imaging studies  CBC: Recent Labs  Lab 06/17/21 1452 06/19/21 0731  WBC 8.2 7.5  HGB 13.0 11.9*  HCT 40.7 37.2  MCV 89.1 89.2  PLT 384 093   Basic Metabolic Panel: Recent Labs  Lab 06/17/21 1452 06/19/21 0731  NA 140 139  K 4.6 4.2  CL 113* 109  CO2 23 24  GLUCOSE 178* 99  BUN 25* 21  CREATININE 1.17* 1.37*  CALCIUM 8.6* 8.7*   GFR: Estimated Creatinine Clearance: 55.3 mL/min (A) (by C-G formula based on SCr of 1.37 mg/dL (H)).  Urine analysis:    Component Value Date/Time   COLORURINE YELLOW (A) 06/05/2021 1742   APPEARANCEUR HAZY (A) 06/05/2021 1742   APPEARANCEUR CLEAR 06/29/2014 0028   LABSPEC 1.026 06/05/2021 1742   LABSPEC 1.025 06/29/2014 0028   PHURINE 5.0 06/05/2021 1742   GLUCOSEU >=500 (A) 06/05/2021 1742   GLUCOSEU see comment 06/29/2014 0028   HGBUR NEGATIVE 06/05/2021 1742   BILIRUBINUR NEGATIVE 06/05/2021 1742   BILIRUBINUR see comment 06/29/2014 0028   KETONESUR NEGATIVE 06/05/2021 1742   PROTEINUR 30 (A) 06/05/2021 1742    NITRITE NEGATIVE 06/05/2021 1742   LEUKOCYTESUR NEGATIVE 06/05/2021 1742   LEUKOCYTESUR see comment 06/29/2014 0028   Recent Results (from the past 240 hour(s))  Resp Panel by RT-PCR (Flu A&B, Covid) Nasopharyngeal Swab     Status: Abnormal   Collection Time: 06/17/21  8:52 PM   Specimen: Nasopharyngeal Swab; Nasopharyngeal(NP) swabs in vial transport medium  Result Value Ref Range Status   SARS Coronavirus 2 by RT PCR POSITIVE (A) NEGATIVE Final    Comment: RESULT CALLED TO, READ BACK BY AND VERIFIED WITH: LEXI OLIVER @ 2154 06/17/21 LFD (NOTE) SARS-CoV-2 target nucleic acids are DETECTED.  The SARS-CoV-2 RNA is generally detectable in upper respiratory specimens during the acute phase of infection. Positive results are indicative of the presence of the identified virus, but do not rule out bacterial infection or co-infection with other pathogens not detected by the test. Clinical correlation with patient history and other diagnostic information is necessary to determine patient infection status. The expected result is Negative.  Fact Sheet  for Patients: EntrepreneurPulse.com.au  Fact Sheet for Healthcare Providers: IncredibleEmployment.be  This test is not yet approved or cleared by the [place] FDA and  has been authorized for detection and/or diagnosis of SARS-CoV-2 by FDA under an Emergency Use Authorization (EUA).  This EUA will remain in effect (meaning this test can be u sed) for the duration of  the COVID-19 declaration under Section 564(b)(1) of the Act, 21 U.S.C. section 360bbb-3(b)(1), unless the authorization is terminated or revoked sooner.     Influenza A by PCR NEGATIVE NEGATIVE Final   Influenza B by PCR NEGATIVE NEGATIVE Final    Comment: (NOTE) The Xpert Xpress SARS-CoV-2/FLU/RSV plus assay is intended as an aid in the diagnosis of influenza from Nasopharyngeal swab specimens and should not be used as a sole basis  for treatment. Nasal washings and aspirates are unacceptable for Xpert Xpress SARS-CoV-2/FLU/RSV testing.  Fact Sheet for Patients: EntrepreneurPulse.com.au  Fact Sheet for Healthcare Providers: IncredibleEmployment.be  This test is not yet approved or cleared by the [place] FDA and has been authorized for detection and/or diagnosis of SARS-CoV-2 by FDA under an Emergency Use Authorization (EUA). This EUA will remain in effect (meaning this test can be used) for the duration of the COVID-19 declaration under Section 564(b)(1) of the Act, 21 U.S.C. section 360bbb-3(b)(1), unless the authorization is terminated or revoked.  Performed at [facility], [address], [place], [place] [address]       Radiology Studies: CT ABDOMEN PELVIS WO CONTRAST  Result Date: 06/17/2021 CLINICAL DATA:  60 year old female with abdominal pain. EXAM: CT ABDOMEN AND PELVIS WITHOUT CONTRAST TECHNIQUE: Multidetector CT imaging of the abdomen and pelvis was performed following the standard protocol without IV contrast. COMPARISON:  CT abdomen pelvis dated 05/04/2020. FINDINGS: Evaluation of this exam is limited in the absence of intravenous contrast. Lower chest: The visualized lung bases are clear. No intra-abdominal free air or free fluid. Hepatobiliary: The liver is unremarkable. No intrahepatic biliary ductal dilatation. Cholecystectomy. Pancreas: Unremarkable. No pancreatic ductal dilatation or surrounding inflammatory changes. Spleen: Normal in size without focal abnormality. Adrenals/Urinary Tract: There is a 14 mm left adrenal adenoma. Indeterminate right adrenal nodule measures 24 mm. There is no hydronephrosis or nephrolithiasis on either side. The visualized ureters and urinary bladder appear unremarkable. Stomach/Bowel: Postsurgical changes of gastric bypass. There is a small hiatal hernia containing the anastomotic suture similar to prior CT. There is  no bowel obstruction. The appendix is not visualized with certainty. No inflammatory changes identified in the right lower quadrant. Vascular/Lymphatic: The abdominal aorta and IVC are unremarkable. No portal venous gas. There is no adenopathy. Reproductive: Hysterectomy. No adnexal masses. Other: Ventral hernia repair mesh. Musculoskeletal: Osteopenia with degenerative changes of the spine. L5-S1 posterior fusion. No acute osseous pathology. Spinal stimulator with battery pack in the right gluteal subcutaneous soft tissues. IMPRESSION: 1. No acute intra-abdominal or pelvic pathology. 2. Postsurgical changes of gastric bypass. Small hiatal hernia containing the anastomotic suture similar to prior CT. No bowel obstruction. Electronically Signed   By: Anner Crete M.D.   On: 06/17/2021 17:48   DG Chest 2 View  Result Date: 06/17/2021 CLINICAL DATA:  Chest pain.  Shortness of breath.  Dry cough. EXAM: CHEST - 2 VIEW COMPARISON:  One-view chest x-ray 06/05/2021 FINDINGS: Is mildly enlarged. Previously seen airspace disease has resolved. No edema or effusion is present. Spinal cord stimulator noted. Other lines or tubes are present. IMPRESSION: Mild cardiomegaly without failure. Electronically Signed   By: Harrell Gave  Mattern M.D.   On: 06/17/2021 14:11   NM Pulmonary Perfusion  Addendum Date: 06/18/2021   ADDENDUM REPORT: 06/18/2021 10:52 ADDENDUM: Critical Value/emergent results were called by telephone at the time of interpretation on 06/18/2021 at 10:52 am to provider DR. Shaylah Mcghie, who verbally acknowledged these results. Electronically Signed   By: Ilona Sorrel M.D.   On: 06/18/2021 10:52   Result Date: 06/18/2021 CLINICAL DATA:  Chest pain and dyspnea.  Recent hospitalization. EXAM: NUCLEAR MEDICINE PERFUSION LUNG SCAN TECHNIQUE: Perfusion images were obtained in multiple projections after intravenous injection of radiopharmaceutical. Ventilation scans intentionally deferred if perfusion scan and chest  x-ray adequate for interpretation during COVID 19 epidemic. RADIOPHARMACEUTICALS:  4.2 mCi Tc-25m MAA IV COMPARISON:  Chest radiograph from one day prior. 12/23/2016 V/Q scan. FINDINGS: Multiple moderate perfusion defects throughout the right greater than left upper lobes bilaterally, without chest radiographic correlate, new from 2018 V/Q scan. IMPRESSION: Multiple moderate perfusion defects throughout the right greater than left upper lobes, without chest radiograph correlate, new from 2018 V/Q scan, moderate to high suspicion for acute pulmonary embolism. Electronically Signed: By: Ilona Sorrel M.D. On: 06/18/2021 10:34   US Venous Img Lower Bilateral (DVT)  Result Date: 06/18/2021 CLINICAL DATA:  COVID-19 infection, elevated D-dimer, renal insufficiency and V/Q scan demonstrating probable evidence of pulmonary embolism. EXAM: BILATERAL LOWER EXTREMITY VENOUS DOPPLER ULTRASOUND TECHNIQUE: Gray-scale sonography with graded compression, as well as color Doppler and duplex ultrasound were performed to evaluate the lower extremity deep venous systems from the level of the common femoral vein and including the common femoral, femoral, profunda femoral, popliteal and calf veins including the posterior tibial, peroneal and gastrocnemius veins when visible. The superficial great saphenous vein was also interrogated. Spectral Doppler was utilized to evaluate flow at rest and with distal augmentation maneuvers in the common femoral, femoral and popliteal veins. COMPARISON:  None. FINDINGS: RIGHT LOWER EXTREMITY Common Femoral Vein: No evidence of thrombus. Normal compressibility, respiratory phasicity and response to augmentation. Saphenofemoral Junction: No evidence of thrombus. Normal compressibility and flow on color Doppler imaging. Profunda Femoral Vein: No evidence of thrombus. Normal compressibility and flow on color Doppler imaging. Femoral Vein: No evidence of thrombus. Normal compressibility, respiratory  phasicity and response to augmentation. Popliteal Vein: No evidence of thrombus. Normal compressibility, respiratory phasicity and response to augmentation. Calf Veins: No evidence of thrombus. Normal compressibility and flow on color Doppler imaging. Superficial Great Saphenous Vein: No evidence of thrombus. Normal compressibility. Venous Reflux:  None. Other Findings: No evidence of superficial thrombophlebitis or abnormal fluid collection. LEFT LOWER EXTREMITY Common Femoral Vein: No evidence of thrombus. Normal compressibility, respiratory phasicity and response to augmentation. Saphenofemoral Junction: No evidence of thrombus. Normal compressibility and flow on color Doppler imaging. Profunda Femoral Vein: No evidence of thrombus. Normal compressibility and flow on color Doppler imaging. Femoral Vein: No evidence of thrombus. Normal compressibility, respiratory phasicity and response to augmentation. Popliteal Vein: No evidence of thrombus. Normal compressibility, respiratory phasicity and response to augmentation. Calf Veins: No evidence of thrombus. Normal compressibility and flow on color Doppler imaging. Superficial Great Saphenous Vein: No evidence of thrombus. Normal compressibility. Venous Reflux:  None. Other Findings: No evidence of superficial thrombophlebitis or abnormal fluid collection. IMPRESSION: No evidence of deep venous thrombosis in either lower extremity. Electronically Signed   By: Aletta Edouard M.D.   On: 06/18/2021 12:01    Scheduled Meds:  ALPRAZolam  0.25 mg Oral TID   atorvastatin  40 mg Oral Daily   busPIRone  7.5  mg Oral BID   fluticasone  1 spray Each Nare Daily   folic acid  1 mg Oral Daily   insulin aspart  0-15 Units Subcutaneous TID WC   insulin aspart  0-5 Units Subcutaneous QHS   insulin detemir  25 Units Subcutaneous Daily   Ipratropium-Albuterol  1 puff Inhalation TID   linagliptin  5 mg Oral Daily   loratadine  10 mg Oral Daily   mirtazapine  45 mg Oral QHS    oxybutynin  5 mg Oral BID   oxyCODONE  20 mg Oral QID   Continuous Infusions:  heparin 850 Units/hr (06/19/21 1038)   pantoprazole Stopped (06/19/21 0839)   remdesivir 100 mg in NS 100 mL 100 mg (06/19/21 1220)     LOS: 1 day   Time spent: 35 minutes   Patrecia Pour, MD [facility] [email or web address] 06/19/2021, 12:38 PM

## 2021-06-19 NOTE — Progress Notes (Signed)
Patient was just admitted earlier this month. See below pasted note from [facility] on 06/07/21:   "PCP is Dr. Elijio Miles. Patient lives close to his office so she usually walks there. On days it is too hot, either she or her grandson drives. Pharmacy is [facility] on [address]. No issues obtaining medications. No home health prior to admission. Patient has a single-point cane and wheelchair (that was her husbands) at home. Patient requesting shower chair and blood pressure cuff. Patient unable to afford to pay for blood pressure cuff. Provided from TOC-donated items. Ordered 3-in-1 through [employer] to be shipped to her home. No further concerns. Patient's grandson is downstairs to pick her up."      Oleh Genin, LCSW [phone]

## 2021-06-19 NOTE — Consult Note (Signed)
[place] for IV Heparin Indication: pulmonary embolus  Patient Measurements: Heparin Dosing Weight: 87.9 kg  Labs: Recent Labs    06/17/21 1452 06/17/21 1930 06/18/21 1336 06/18/21 2009 06/19/21 0731 06/19/21 1525 06/19/21 2056  HGB 13.0  --   --   --  11.9*  --   --   HCT 40.7  --   --   --  37.2  --   --   PLT 384  --   --   --  310  --   --   APTT  --   --  26  --   --   --   --   LABPROT  --   --  13.7  --   --   --   --   INR  --   --  1.1  --   --   --   --   HEPARINUNFRC  --   --   --    < > 0.80* 0.39 0.32  CREATININE 1.17*  --   --   --  1.37*  --   --   TROPONINIHS 3 3  --   --   --   --   --    < > = values in this interval not displayed.     Estimated Creatinine Clearance: 55.3 mL/min (A) (by C-G formula based on SCr of 1.37 mg/dL (H)).   Medical History: Past Medical History:  Diagnosis Date   Allergy    Anemia    Arthritis    knees   COPD (chronic obstructive pulmonary disease) ([place])    Diabetes mellitus without complication (HCC)    diet controlled   GERD (gastroesophageal reflux disease)    Heart attack ([place]) 2002   Hyperlipidemia    Hypertension    PUD (peptic ulcer disease)    Sleep apnea    can't tolerate CPAP.  Uses O2 only   Spinal cord stimulator status    for lower back pain   Stroke [facility]) 2004   "mini-stroke"   Vertigo    Wears dentures    full upper and lower    Medications:  No anticoagulation prior to admission per my chart review  Assessment: Patient is a 60 y/o F with medical history as above who is admitted with COVID-19 infection, chest pain, and melena possibly secondary to peptic ulcer which she has a history of. VQ scan with moderate-high suspicion for PE. Patient is hemodynamically stable but bradycardic. Per GI note, no contraindication to start therapeutic anticoagulation as patient is not actively bleeding nor does she has hemodynamically significant GI bleed. Pharmacy consulted  to initiate heparin infusion for suspected PE.   7/23 @2009  HL >1.10 - spoke with nurse and confirmed lab was not drawn from same arm as heparin infusion 7/24 @0731  HL 0.8 - supratherapeutic 7/24 @1525  HL 0.39 - therapeutic 7/24 @2056  HL 0.32 - therapeutic x2  Goal of Therapy:  Heparin level 0.3-0.7 units/ml Monitor platelets by anticoagulation protocol: Yes   Plan:  HL is therapeutic x2. Continue heparin infusion at 850 units/hr.  Recheck heparin level with AM labs CBC daily while on heparin.  Monitor closely for active GIB, worsening anemia given presentation / history  Sherilyn Banker, PharmD 06/19/2021,9:59 PM

## 2021-06-20 DIAGNOSIS — I2699 Other pulmonary embolism without acute cor pulmonale: Secondary | ICD-10-CM | POA: Diagnosis not present

## 2021-06-20 LAB — CBC
HCT: 35.1 % — ABNORMAL LOW (ref 36.0–46.0)
Hemoglobin: 11.3 g/dL — ABNORMAL LOW (ref 12.0–15.0)
MCH: 28.6 pg (ref 26.0–34.0)
MCHC: 32.2 g/dL (ref 30.0–36.0)
MCV: 88.9 fL (ref 80.0–100.0)
Platelets: 287 10*3/uL (ref 150–400)
RBC: 3.95 MIL/uL (ref 3.87–5.11)
RDW: 14.2 % (ref 11.5–15.5)
WBC: 7.3 10*3/uL (ref 4.0–10.5)
nRBC: 0 % (ref 0.0–0.2)

## 2021-06-20 LAB — GLUCOSE, CAPILLARY
Glucose-Capillary: 128 mg/dL — ABNORMAL HIGH (ref 70–99)
Glucose-Capillary: 157 mg/dL — ABNORMAL HIGH (ref 70–99)
Glucose-Capillary: 158 mg/dL — ABNORMAL HIGH (ref 70–99)
Glucose-Capillary: 211 mg/dL — ABNORMAL HIGH (ref 70–99)

## 2021-06-20 LAB — HEPARIN LEVEL (UNFRACTIONATED): Heparin Unfractionated: 0.14 IU/mL — ABNORMAL LOW (ref 0.30–0.70)

## 2021-06-20 MED ORDER — APIXABAN 5 MG PO TABS
10.0000 mg | ORAL_TABLET | Freq: Two times a day (BID) | ORAL | Status: DC
  Administered 2021-06-20 – 2021-06-22 (×5): 10 mg via ORAL
  Filled 2021-06-20 (×5): qty 2

## 2021-06-20 MED ORDER — APIXABAN 5 MG PO TABS: 5.0000 mg | ORAL_TABLET | Freq: Two times a day (BID) | ORAL | Status: DC

## 2021-06-20 MED ORDER — HEPARIN BOLUS VIA INFUSION
2600.0000 [IU] | Freq: Once | INTRAVENOUS | Status: AC
  Administered 2021-06-20: 2600 [IU] via INTRAVENOUS
  Filled 2021-06-20: qty 2600

## 2021-06-20 NOTE — Progress Notes (Addendum)
22g Left forearm IV site upon assessment.

## 2021-06-20 NOTE — Consult Note (Signed)
[place] for transition from IV heparin to Apixaban Indication: pulmonary embolus  Patient Measurements: Heparin Dosing Weight: 87.9 kg  Labs: Recent Labs    06/17/21 1452 06/17/21 1930 06/18/21 1336 06/18/21 2009 06/19/21 0731 06/19/21 1525 06/19/21 2056 06/20/21 0405  HGB 13.0  --   --   --  11.9*  --   --  11.3*  HCT 40.7  --   --   --  37.2  --   --  35.1*  PLT 384  --   --   --  310  --   --  287  APTT  --   --  26  --   --   --   --   --   LABPROT  --   --  13.7  --   --   --   --   --   INR  --   --  1.1  --   --   --   --   --   HEPARINUNFRC  --   --   --    < > 0.80* 0.39 0.32 0.14*  CREATININE 1.17*  --   --   --  1.37*  --   --   --   TROPONINIHS 3 3  --   --   --   --   --   --    < > = values in this interval not displayed.     Estimated Creatinine Clearance: 55.3 mL/min (A) (by C-G formula based on SCr of 1.37 mg/dL (H)).   Medical History: Past Medical History:  Diagnosis Date   Allergy    Anemia    Arthritis    knees   COPD (chronic obstructive pulmonary disease) ([place])    Diabetes mellitus without complication (HCC)    diet controlled   GERD (gastroesophageal reflux disease)    Heart attack ([place]) 2002   Hyperlipidemia    Hypertension    PUD (peptic ulcer disease)    Sleep apnea    can't tolerate CPAP.  Uses O2 only   Spinal cord stimulator status    for lower back pain   Stroke [facility]) 2004   "mini-stroke"   Vertigo    Wears dentures    full upper and lower    Medications:  No anticoagulation prior to admission per my chart review  Assessment: Patient is a 60 y/o F with medical history as above who is admitted with COVID-19 infection, chest pain, and melena possibly secondary to peptic ulcer which she has a history of. VQ scan with moderate-high suspicion for PE. Patient is hemodynamically stable but bradycardic. Per GI note, no contraindication to start therapeutic anticoagulation as patient is not  actively bleeding nor does she has hemodynamically significant GI bleed. Pharmacy consulted to initiate heparin infusion for suspected PE.   7/23 @2009  HL >1.10 - spoke with nurse and confirmed lab was not drawn from same arm as heparin infusion 7/24 @0731  HL 0.8 - supratherapeutic 7/24 @1525  HL 0.39 - therapeutic 7/24 @2056  HL 0.32 - therapeutic x2 7/25 @0405  HL 0.14 - supratherapeutic 7/25 Heparin drip discontinued ~09:30    Plan:  Will initiate patient on Apixaban 10mg  twice daily for 7 days followed by 5mg  twice daily. Will continue to monitor CBC per protocol.  Paulina Fusi, PharmD, BCPS 06/20/2021 9:43 AM

## 2021-06-20 NOTE — Progress Notes (Signed)
   06/20/21 1200  Clinical Encounter Type  Visited With Patient  Visit Type Initial;Spiritual support;Social support  Referral From Nurse  Consult/Referral To [place] did AD education with PT, as requested. PT will complete it and will contact the nurse when she is ready to have it notarized.

## 2021-06-20 NOTE — Consult Note (Signed)
[place] for IV Heparin Indication: pulmonary embolus  Patient Measurements: Heparin Dosing Weight: 87.9 kg  Labs: Recent Labs    06/17/21 1452 06/17/21 1930 06/18/21 1336 06/18/21 2009 06/19/21 0731 06/19/21 1525 06/19/21 2056 06/20/21 0405  HGB 13.0  --   --   --  11.9*  --   --  11.3*  HCT 40.7  --   --   --  37.2  --   --  35.1*  PLT 384  --   --   --  310  --   --  287  APTT  --   --  26  --   --   --   --   --   LABPROT  --   --  13.7  --   --   --   --   --   INR  --   --  1.1  --   --   --   --   --   HEPARINUNFRC  --   --   --    < > 0.80* 0.39 0.32 0.14*  CREATININE 1.17*  --   --   --  1.37*  --   --   --   TROPONINIHS 3 3  --   --   --   --   --   --    < > = values in this interval not displayed.     Estimated Creatinine Clearance: 55.3 mL/min (A) (by C-G formula based on SCr of 1.37 mg/dL (H)).   Medical History: Past Medical History:  Diagnosis Date   Allergy    Anemia    Arthritis    knees   COPD (chronic obstructive pulmonary disease) ([place])    Diabetes mellitus without complication (HCC)    diet controlled   GERD (gastroesophageal reflux disease)    Heart attack ([place]) 2002   Hyperlipidemia    Hypertension    PUD (peptic ulcer disease)    Sleep apnea    can't tolerate CPAP.  Uses O2 only   Spinal cord stimulator status    for lower back pain   Stroke [facility]) 2004   "mini-stroke"   Vertigo    Wears dentures    full upper and lower    Medications:  No anticoagulation prior to admission per my chart review  Assessment: Patient is a 60 y/o F with medical history as above who is admitted with COVID-19 infection, chest pain, and melena possibly secondary to peptic ulcer which she has a history of. VQ scan with moderate-high suspicion for PE. Patient is hemodynamically stable but bradycardic. Per GI note, no contraindication to start therapeutic anticoagulation as patient is not actively bleeding nor does  she has hemodynamically significant GI bleed. Pharmacy consulted to initiate heparin infusion for suspected PE.   7/23 @2009  HL >1.10 - spoke with nurse and confirmed lab was not drawn from same arm as heparin infusion 7/24 @0731  HL 0.8 - supratherapeutic 7/24 @1525  HL 0.39 - therapeutic 7/24 @2056  HL 0.32 - therapeutic x2  Goal of Therapy:  Heparin level 0.3-0.7 units/ml Monitor platelets by anticoagulation protocol: Yes   Plan:  7/25:  HL @ 0405 = 0.14 (subtherapeutic) Will order heparin 2600 units IV X 1 bolus and increase drip rate to 1100 units/hr. Will recheck HL 6 hrs after rate change.  Ronika Kelson D, PharmD 06/20/2021,5:31 AM

## 2021-06-20 NOTE — Progress Notes (Signed)
PROGRESS NOTE  Lindsey [name]  DOB: [birth year]  PCP: Jodi Marble, MD NLZ:[id number]  DOA: 06/17/2021  LOS: 2 days  Hospital Day: 4   Chief Complaint  Patient presents with   Chest Pain    Brief narrative: Lindsey [name] is a 60 y.o. female with PMH significant for PUD, GERD, morbid obesity s/p Roux-en-Y gastric bypass 2006, CVA, CAD s/p MI, IDT2DM, HTN, HLD and chronic pain and recent admission for suspicion of unintentional opioid overdose requiring intubation. Patient presented to the ED on 7/22 with complaint of chest pain, dyspnea, cough, epigastric pain as well as few days of melanotic stools.  In the ED, vital signs were stable.  Labs showed hemoglobin normal at 13, troponin negative, COVID PCR positive.  Chest x-ray did not show any infiltrates.  CT abd/pelvis revealed no acute findings, stable hiatal hernia containing anastomotic suture.  Admitted to hospital service for further evaluation management 7/23, V/Q scan (due to contrast allergy) showed multiple perfusion defects consistent with pulmonary emboli for which heparin was started.  GI was also consulted because of epigastric pain, deferred endoscopic evaluation at this time.  Subjective: Patient was seen and examined this morning.  Pleasant middle-aged African-American female.  Lying in bed.  Not in distress.  Assessment and plan: Acute pulmonary embolism -7/23, VQ scan showed multiple moderate perfusion defects throughout bilateral upper lobes, right more than left. -DVT scan of lower extremities negative. -Started on heparin drip.  After 72 hours of heparin drip, I switched her to Eliquis this morning.  Severe epigastric abdominal pain -For 2 days prior to presentation, also reported melena  -History of gastric bypass surgery.   -Last endoscopy normal about a year ago  -CT abdomen pelvis on admission reassuring for any acute intra abdominal/intra pelvic pathology.   -GI consultation was obtained  because of severity of symptoms.   -Endoscopy deferred at this time.   -Patient is currently on chronic pain regimen with oxycodone.   -Continue Protonix 40 mg twice daily.  Avoid NSAID. -Hemoglobin remained stable with ferritin 396. Recent Labs    06/05/21 1742 06/06/21 0501 06/17/21 1452 06/19/21 0731 06/20/21 0405  HGB 12.4 12.7 13.0 11.9* 11.3*  MCV 92.4 88.4 89.1 89.2 88.9  VITAMINB12  --   --   --  195  --   FOLATE  --   --   --  47.0  --   FERRITIN  --   --   --  396*  --   TIBC  --   --   --  273  --   IRON  --   --   --  92  --    Covid-19 infection -COVID PCR positive on admission without symptoms.  Fully vaccinated.   -Isolation for 10 days.   -Given 3 days of remdesivir.   Type 2 diabetes mellitus -A1c 7.3 on 7/10 -Currently on Lantus 25 units daily with sliding scale insulin.  On home metformin, Farxiga, Onglyza, semaglutide, Tresiba -Blood sugar level consistently less than 150. Recent Labs  Lab 06/19/21 0840 06/19/21 1213 06/19/21 1729 06/19/21 2201 06/20/21 0841  GLUCAP 85 172* 130* 146* 128*    CAD/HLD  -negative troponin, no ischemic ECG changes noted. -Not on aspirin. Continue BB. Continue statin   Essential hypertension -Currently normotensive without blood pressure meds.  -Home meds include clonidine 0.1 mg at bedtime, Benicar/HCTZ 20 mg / 12.5 mg daily. -Resume home meds when appropriate.   Anxiety: - Continue buspar, very low dose xanax  Mobility: Encourage ambulation.  Uses a walker Code Status:   Code Status: Full Code  Nutritional status: Body mass index is 39.16 kg/m.     Diet: We will advance to full liquid diet today. Diet Order             Diet full liquid Room service appropriate? Yes; Fluid consistency: Thin  Diet effective now                  DVT prophylaxis:   apixaban (ELIQUIS) tablet 10 mg  apixaban (ELIQUIS) tablet 5 mg   Antimicrobials: None Fluid: None Consultants: GI Family Communication: None at  bedside  Status is: Inpatient  Remains inpatient appropriate because: Continues to have severe abdominal pain  Dispo: The patient is from: Home              Anticipated d/c is to: Hopefully home in 2 to 3 days              Patient currently is not medically stable to d/c.   Difficult to place patient No     Infusions:   sodium chloride Stopped (06/19/21 2327)    Scheduled Meds:  ALPRAZolam  0.25 mg Oral TID   apixaban  10 mg Oral BID   Followed by   Derrill Memo ON 06/27/2021] apixaban  5 mg Oral BID   atorvastatin  40 mg Oral Daily   busPIRone  7.5 mg Oral BID   fluticasone  1 spray Each Nare Daily   folic acid  1 mg Oral Daily   insulin aspart  0-15 Units Subcutaneous TID WC   insulin aspart  0-5 Units Subcutaneous QHS   insulin detemir  25 Units Subcutaneous Daily   Ipratropium-Albuterol  1 puff Inhalation TID   linagliptin  5 mg Oral Daily   loratadine  10 mg Oral Daily   metoprolol succinate  100 mg Oral Daily   mirtazapine  45 mg Oral QHS   oxybutynin  5 mg Oral BID   oxyCODONE  20 mg Oral Q6H   pantoprazole  40 mg Oral BID    Antimicrobials: Anti-infectives (From admission, onward)    Start     Dose/Rate Route Frequency Ordered Stop   06/18/21 1000  remdesivir 100 mg in sodium chloride 0.9 % 100 mL IVPB       See Hyperspace for full Linked Orders Report.   100 mg 200 mL/hr over 30 Minutes Intravenous Daily 06/17/21 2220 06/20/21 0949   06/17/21 2300  remdesivir 200 mg in sodium chloride 0.9% 250 mL IVPB       See Hyperspace for full Linked Orders Report.   200 mg 580 mL/hr over 30 Minutes Intravenous Once 06/17/21 2220 06/18/21 0043       PRN meds: sodium chloride, albuterol, ondansetron **OR** ondansetron (ZOFRAN) IV, polyethylene glycol, traMADol   Objective: Vitals:   06/19/21 2036 06/20/21 0552  BP: 113/68 106/62  Pulse: 80 83  Resp: 17 16  Temp: 97.8 F (36.6 C) 98 F (36.7 C)  SpO2: 97% 94%    Intake/Output Summary (Last 24 hours) at  06/20/2021 1106 Last data filed at 06/20/2021 0762 Gross per 24 hour  Intake 373.38 ml  Output --  Net 373.38 ml   Filed Weights   06/17/21 1220  Weight: 113.4 kg   Weight change:  Body mass index is 39.16 kg/m.   Physical Exam: General exam: Pleasant, middle-aged African-American female.  Mild distress because of epigastric pain Skin: No rashes, lesions or  ulcers. HEENT: Atraumatic, normocephalic, no obvious bleeding Lungs: Clear to auscultation bilaterally CVS: Regular rate and rhythm, no murmur GI/Abd soft, moderate to severe tenderness on the epigastrium on gentle touch, bowel sound present CNS: Alert, awake, oriented x3 Psychiatry: Mood appropriate Extremities: No pedal edema, no calf tenderness  Data Review: I have personally reviewed the laboratory data and studies available.  Recent Labs  Lab 06/17/21 1452 06/19/21 0731 06/20/21 0405  WBC 8.2 7.5 7.3  HGB 13.0 11.9* 11.3*  HCT 40.7 37.2 35.1*  MCV 89.1 89.2 88.9  PLT 384 310 287   Recent Labs  Lab 06/17/21 1452 06/19/21 0731  NA 140 139  K 4.6 4.2  CL 113* 109  CO2 23 24  GLUCOSE 178* 99  BUN 25* 21  CREATININE 1.17* 1.37*  CALCIUM 8.6* 8.7*    F/u labs ordered Unresulted Labs (From admission, onward)     Start     Ordered   06/21/21 0500  CBC with Differential/Platelet  Daily,   R      06/20/21 1041   06/21/21 4128  Basic metabolic panel  Daily,   R      06/20/21 1041            Signed, Terrilee Croak, MD [facility] 06/20/2021

## 2021-06-21 DIAGNOSIS — I2699 Other pulmonary embolism without acute cor pulmonale: Secondary | ICD-10-CM | POA: Diagnosis not present

## 2021-06-21 LAB — CBC WITH DIFFERENTIAL/PLATELET
Abs Immature Granulocytes: 0.03 10*3/uL (ref 0.00–0.07)
Basophils Absolute: 0 10*3/uL (ref 0.0–0.1)
Basophils Relative: 0 %
Eosinophils Absolute: 0.2 10*3/uL (ref 0.0–0.5)
Eosinophils Relative: 3 %
HCT: 35.5 % — ABNORMAL LOW (ref 36.0–46.0)
Hemoglobin: 11.4 g/dL — ABNORMAL LOW (ref 12.0–15.0)
Immature Granulocytes: 0 %
Lymphocytes Relative: 33 %
Lymphs Abs: 2.4 10*3/uL (ref 0.7–4.0)
MCH: 28.1 pg (ref 26.0–34.0)
MCHC: 32.1 g/dL (ref 30.0–36.0)
MCV: 87.4 fL (ref 80.0–100.0)
Monocytes Absolute: 0.5 10*3/uL (ref 0.1–1.0)
Monocytes Relative: 7 %
Neutro Abs: 4.2 10*3/uL (ref 1.7–7.7)
Neutrophils Relative %: 57 %
Platelets: 274 10*3/uL (ref 150–400)
RBC: 4.06 MIL/uL (ref 3.87–5.11)
RDW: 14.4 % (ref 11.5–15.5)
WBC: 7.4 10*3/uL (ref 4.0–10.5)
nRBC: 0 % (ref 0.0–0.2)

## 2021-06-21 LAB — BASIC METABOLIC PANEL
Anion gap: 2 — ABNORMAL LOW (ref 5–15)
BUN: 15 mg/dL (ref 8–23)
CO2: 27 mmol/L (ref 22–32)
Calcium: 8.7 mg/dL — ABNORMAL LOW (ref 8.9–10.3)
Chloride: 112 mmol/L — ABNORMAL HIGH (ref 98–111)
Creatinine, Ser: 1.25 mg/dL — ABNORMAL HIGH (ref 0.44–1.00)
GFR, Estimated: 49 mL/min — ABNORMAL LOW (ref >60)
Glucose, Bld: 94 mg/dL (ref 70–99)
Potassium: 4 mmol/L (ref 3.5–5.1)
Sodium: 141 mmol/L (ref 135–145)

## 2021-06-21 LAB — GLUCOSE, CAPILLARY
Glucose-Capillary: 107 mg/dL — ABNORMAL HIGH (ref 70–99)
Glucose-Capillary: 144 mg/dL — ABNORMAL HIGH (ref 70–99)
Glucose-Capillary: 141 mg/dL — ABNORMAL HIGH (ref 70–99)
Glucose-Capillary: 129 mg/dL — ABNORMAL HIGH (ref 70–99)

## 2021-06-21 MED ORDER — SUCRALFATE 1 GM/10ML PO SUSP
1.0000 g | Freq: Three times a day (TID) | ORAL | Status: DC
  Filled 2021-06-21: qty 10

## 2021-06-21 MED ORDER — POLYETHYLENE GLYCOL 3350 17 G PO PACK
17.0000 g | PACK | Freq: Every day | ORAL | Status: DC
  Filled 2021-06-21: qty 1

## 2021-06-21 NOTE — Progress Notes (Signed)
Mobility Specialist - Progress Note   06/21/21 1600  Mobility  Activity Ambulated in room  Level of Assistance Independent  Assistive Device None  Distance Ambulated (ft) 80 ft  Mobility Ambulated independently in room  Mobility Response Tolerated well  Mobility performed by Mobility specialist  $Mobility charge 1 Mobility    Pre-mobility: 84 HR, 95% SpO2 During mobility: 101 HR, 93% SpO2 Post-mobility: 93 HR, 95% SpO2   Pt ambulated in room independently, utilizing RA. No AD, no LOB. O2 maintained mid-high 90s with a brief low of 91%. Denied SOB and dizziness. Voiced pain only in abdomen 8/10.    Kathee Delton Mobility Specialist 06/21/21, 4:22 PM

## 2021-06-21 NOTE — Progress Notes (Signed)
PROGRESS NOTE  Lindsey [name]  DOB: February 27, 1961  PCP: Jodi Marble, MD [id number]  DOA: 06/17/2021  LOS: 3 days  Hospital Day: 5   Chief Complaint  Patient presents with   Chest Pain    Brief narrative: Lindsey [name] is a 60 y.o. female with PMH significant for PUD, GERD, morbid obesity s/p Roux-en-Y gastric bypass 2006, CVA, CAD s/p MI, IDT2DM, HTN, HLD and chronic pain and recent admission for suspicion of unintentional opioid overdose requiring intubation. Patient presented to the ED on 7/22 with complaint of chest pain, dyspnea, cough, epigastric pain as well as few days of melanotic stools.  In the ED, vital signs were stable.  Labs showed hemoglobin normal at 13, troponin negative, COVID PCR positive.  Chest x-ray did not show any infiltrates.  CT abd/pelvis revealed no acute findings, stable hiatal hernia containing anastomotic suture.  Admitted to hospital service for further evaluation management 7/23, V/Q scan (due to contrast allergy) showed multiple perfusion defects consistent with pulmonary emboli for which heparin was started.  GI was also consulted because of epigastric pain, deferred endoscopic evaluation at this time.  Subjective: Patient was seen and examined this morning. Continues to complain of epigastric pain.  No evidence of bleeding.  Hemoglobin stable.    Assessment and plan: Acute pulmonary embolism -7/23, VQ scan showed multiple moderate perfusion defects throughout bilateral upper lobes, right more than left. -DVT scan of lower extremities negative. -She was initially started on heparin drip.  After 72 hours of heparin drip, I switched her to oral Eliquis on 7/25.  Severe epigastric abdominal pain History of gastric bypass surgery -Continues to have epigastric pain. She also had melena prior to presentation.   -Last endoscopy normal about a year ago  -CT abdomen pelvis on admission reassuring for any acute intra abdominal/intra pelvic  pathology.   -GI consultation was obtained because of severity of symptoms.   -Discussed again with GI Dr. Vicente Males this morning.  Patient has stable hemoglobin.  She is already on Protonix twice daily.  She has increased risk of negative outcome with endoscopy at this time because of concomitant COVID infection, and acute pulmonary embolism on anticoagulation -We will add Carafate.  We will give her MiraLAX to relieve constipation -Continue Protonix. -Advance diet to regular consistency. -Continue to monitor symptoms. Recent Labs    06/06/21 0501 06/17/21 1452 06/19/21 0731 06/20/21 0405 06/21/21 0503  HGB 12.7 13.0 11.9* 11.3* 11.4*  MCV 88.4 89.1 89.2 88.9 87.4  VITAMINB12  --   --  195  --   --   FOLATE  --   --  47.0  --   --   FERRITIN  --   --  396*  --   --   TIBC  --   --  273  --   --   IRON  --   --  92  --   --    Covid-19 infection -COVID PCR positive on admission without symptoms.  Fully vaccinated.   -Isolation for 10 days.   -Given 3 days of remdesivir.   Type 2 diabetes mellitus -A1c 7.3 on 7/10 -Currently on Lantus 25 units daily with sliding scale insulin.  On hold are metformin, Farxiga, Onglyza, semaglutide, Tresiba -Blood sugar level consistently less than 150. -Monitor glucose with diet advancement. Recent Labs  Lab 06/20/21 1220 06/20/21 1701 06/20/21 2248 06/21/21 0813 06/21/21 1122  GLUCAP 157* 158* 211* 107* 144*    CAD/HLD  -negative troponin, no ischemic ECG  changes noted. -Not on aspirin. Continue BB. Continue statin   Essential hypertension -Currently normotensive without blood pressure meds.  -Home meds include clonidine 0.1 mg at bedtime, Benicar/HCTZ 20 mg / 12.5 mg daily.  Currently on hold. -Resume home meds when appropriate.   Anxiety: - Continue buspar, very low dose xanax  Chronic pain -Chronically on oxycodone.  Mobility: Encourage ambulation.  Uses a walker Code Status:   Code Status: Full Code  Nutritional status: Body  mass index is 39.16 kg/m.     Diet: We will advance to full liquid diet today. Diet Order             Diet heart healthy/carb modified Room service appropriate? Yes; Fluid consistency: Thin  Diet effective now                  DVT prophylaxis:   apixaban (ELIQUIS) tablet 10 mg  apixaban (ELIQUIS) tablet 5 mg   Antimicrobials: None Fluid: None Consultants: GI Family Communication: None at bedside  Status is: Inpatient  Remains inpatient appropriate because: Continues to have severe abdominal pain  Dispo: The patient is from: Home              Anticipated d/c is to: Hopefully home in 2 to 3 days              Patient currently is not medically stable to d/c.   Difficult to place patient No     Infusions:   sodium chloride Stopped (06/19/21 2327)    Scheduled Meds:  ALPRAZolam  0.25 mg Oral TID   apixaban  10 mg Oral BID   Followed by   Derrill Memo ON 06/27/2021] apixaban  5 mg Oral BID   atorvastatin  40 mg Oral Daily   busPIRone  7.5 mg Oral BID   fluticasone  1 spray Each Nare Daily   folic acid  1 mg Oral Daily   insulin aspart  0-15 Units Subcutaneous TID WC   insulin aspart  0-5 Units Subcutaneous QHS   insulin detemir  25 Units Subcutaneous Daily   Ipratropium-Albuterol  1 puff Inhalation TID   linagliptin  5 mg Oral Daily   loratadine  10 mg Oral Daily   metoprolol succinate  100 mg Oral Daily   mirtazapine  45 mg Oral QHS   oxybutynin  5 mg Oral BID   oxyCODONE  20 mg Oral Q6H   pantoprazole  40 mg Oral BID   polyethylene glycol  17 g Oral Daily   sucralfate  1 g Oral TID WC & HS    Antimicrobials: Anti-infectives (From admission, onward)    Start     Dose/Rate Route Frequency Ordered Stop   06/18/21 1000  remdesivir 100 mg in sodium chloride 0.9 % 100 mL IVPB       See Hyperspace for full Linked Orders Report.   100 mg 200 mL/hr over 30 Minutes Intravenous Daily 06/17/21 2220 06/20/21 0949   06/17/21 2300  remdesivir 200 mg in sodium chloride  0.9% 250 mL IVPB       See Hyperspace for full Linked Orders Report.   200 mg 580 mL/hr over 30 Minutes Intravenous Once 06/17/21 2220 06/18/21 0043       PRN meds: sodium chloride, albuterol, ondansetron **OR** ondansetron (ZOFRAN) IV, polyethylene glycol, traMADol   Objective: Vitals:   06/21/21 0814 06/21/21 0907  BP: 98/70 116/73  Pulse: 72 71  Resp:    Temp: 97.8 F (36.6 C)   SpO2:  96%     Intake/Output Summary (Last 24 hours) at 06/21/2021 1214 Last data filed at 06/21/2021 0646 Gross per 24 hour  Intake 480 ml  Output --  Net 480 ml   Filed Weights   06/17/21 1220  Weight: 113.4 kg   Weight change:  Body mass index is 39.16 kg/m.   Physical Exam: General exam: Pleasant, middle-aged African-American female.  Mild distress because of epigastric pain Skin: No rashes, lesions or ulcers. HEENT: Atraumatic, normocephalic, no obvious bleeding Lungs: Clear to auscultation bilaterally CVS: Regular rate and rhythm, no murmur GI/Abd soft, continues to have moderate to severe tenderness on the epigastrium on gentle touch, bowel sound present CNS: Alert, awake, oriented x3 Psychiatry: Mood appropriate Extremities: No pedal edema, no calf tenderness  Data Review: I have personally reviewed the laboratory data and studies available.  Recent Labs  Lab 06/17/21 1452 06/19/21 0731 06/20/21 0405 06/21/21 0503  WBC 8.2 7.5 7.3 7.4  NEUTROABS  --   --   --  4.2  HGB 13.0 11.9* 11.3* 11.4*  HCT 40.7 37.2 35.1* 35.5*  MCV 89.1 89.2 88.9 87.4  PLT 384 310 287 274   Recent Labs  Lab 06/17/21 1452 06/19/21 0731 06/21/21 0503  NA 140 139 141  K 4.6 4.2 4.0  CL 113* 109 112*  CO2 23 24 27   GLUCOSE 178* 99 94  BUN 25* 21 15  CREATININE 1.17* 1.37* 1.25*  CALCIUM 8.6* 8.7* 8.7*    F/u labs ordered Unresulted Labs (From admission, onward)     Start     Ordered   06/21/21 0500  CBC with Differential/Platelet  Daily,   R      06/20/21 1041   06/21/21 5750   Basic metabolic panel  Daily,   R      06/20/21 1041            Signed, Terrilee Croak, MD [facility] 06/21/2021

## 2021-06-21 NOTE — TOC Initial Note (Signed)
Transition of Care (TOC) - Initial/Assessment Note    Patient Details  Name: Lindsey [name] MRN: [id number] Date of Birth: February 16, 1961  Transition of Care [facility]) CM/SW Contact:    Beverly Sessions, RN Phone Number: 06/21/2021, 12:55 PM  Clinical Narrative:                  Eliquis coupon placed on chart  Due to covid isolation precautions bedside RN to take in coupon the next time she is in the room       Patient Goals and CMS Choice        Expected Discharge Plan and Services                                                Prior Living Arrangements/Services                       Activities of Daily Living Home Assistive Devices/Equipment: Blood pressure cuff, Bedside commode/3-in-1 ADL Screening (condition at time of admission) Patient's cognitive ability adequate to safely complete daily activities?: Yes Is the patient deaf or have difficulty hearing?: No Does the patient have difficulty seeing, even when wearing glasses/contacts?: No Does the patient have difficulty concentrating, remembering, or making decisions?: No Patient able to express need for assistance with ADLs?: Yes Does the patient have difficulty dressing or bathing?: No Independently performs ADLs?: Yes (appropriate for developmental age) Does the patient have difficulty walking or climbing stairs?: No Weakness of Legs: None Weakness of Arms/Hands: None  Permission Sought/Granted                  Emotional Assessment              Admission diagnosis:  Dyspnea [R06.00] Elevated d-dimer [R79.89] Abdominal pain, unspecified abdominal location [R10.9] Acute pulmonary embolism without acute cor pulmonale (HCC) [I26.99] COVID-19 [U07.1] Patient Active Problem List   Diagnosis Date Noted   Acute pulmonary embolism without acute cor pulmonale ([place]) 06/18/2021   Elevated LFTs    Other chronic pain    Acute respiratory failure with hypoxia and hypercapnia ([place])  06/06/2021   Opioid overdose ([place]) 06/06/2021   Poorly controlled type 2 diabetes mellitus ([place]) 06/06/2021   Transaminitis 06/06/2021   Lactic acidosis 06/06/2021   Chronic obstructive pulmonary disease, unspecified COPD type ([place]) 06/05/2021   Acute lower UTI 05/08/2020   Upper GI bleed 05/04/2020   Acute kidney injury superimposed on chronic kidney disease ([place]) 06/09/2017   Symptomatic anemia 12/24/2016   Chest pain 12/23/2016   Dyspnea 12/23/2016   Palpitations 12/23/2016   Hypotension 09/29/2016   Renal insufficiency 09/29/2016   Anemia 09/29/2016   Leukocytosis 09/29/2016   Prediabetes 09/29/2016   Jejunal inflammation 09/29/2016   Abnormal findings on esophagogastroduodenoscopy (EGD) 09/29/2016   Vitamin D deficiency 09/29/2016   Syncope 09/27/2016   Medication overuse headache 11/08/2015   Morbid obesity with BMI of 45.0-49.9, adult ([place]) 11/08/2015   Seizures ([place]) 09/02/2015   Lipodystrophy 40/98/1191   Complications due to nervous system device, implant, and graft 04/18/2013   Disease of female genital organs 04/01/2013   Difficult or painful urination 03/18/2013   Urge incontinence 03/18/2013   Urgency of micturation 03/18/2013   PCP:  Jodi Marble, MD Pharmacy:   [place], [place] AT [facility]  [address] [place] [address] Phone: [phone] Fax: [phone]     Social Determinants of Health (SDOH) Interventions    Readmission Risk Interventions Readmission Risk Prevention Plan 06/07/2021  Transportation Screening Complete  PCP or Specialist Appt within 3-5 Days Complete  Social Work Consult for [place] Planning/Counseling Complete  Palliative Care Screening Not Applicable  Medication Review Press photographer) Complete  Some recent data might be hidden

## 2021-06-22 DIAGNOSIS — I2699 Other pulmonary embolism without acute cor pulmonale: Secondary | ICD-10-CM | POA: Diagnosis not present

## 2021-06-22 LAB — CBC WITH DIFFERENTIAL/PLATELET
Abs Immature Granulocytes: 0.02 10*3/uL (ref 0.00–0.07)
Basophils Absolute: 0 10*3/uL (ref 0.0–0.1)
Basophils Relative: 1 %
Eosinophils Absolute: 0.2 10*3/uL (ref 0.0–0.5)
Eosinophils Relative: 4 %
HCT: 34.8 % — ABNORMAL LOW (ref 36.0–46.0)
Hemoglobin: 11.3 g/dL — ABNORMAL LOW (ref 12.0–15.0)
Immature Granulocytes: 0 %
Lymphocytes Relative: 42 %
Lymphs Abs: 2.5 10*3/uL (ref 0.7–4.0)
MCH: 28.8 pg (ref 26.0–34.0)
MCHC: 32.5 g/dL (ref 30.0–36.0)
MCV: 88.5 fL (ref 80.0–100.0)
Monocytes Absolute: 0.5 10*3/uL (ref 0.1–1.0)
Monocytes Relative: 8 %
Neutro Abs: 2.7 10*3/uL (ref 1.7–7.7)
Neutrophils Relative %: 45 %
Platelets: 274 10*3/uL (ref 150–400)
RBC: 3.93 MIL/uL (ref 3.87–5.11)
RDW: 14.4 % (ref 11.5–15.5)
WBC: 6 10*3/uL (ref 4.0–10.5)
nRBC: 0 % (ref 0.0–0.2)

## 2021-06-22 LAB — GLUCOSE, CAPILLARY
Glucose-Capillary: 115 mg/dL — ABNORMAL HIGH (ref 70–99)
Glucose-Capillary: 144 mg/dL — ABNORMAL HIGH (ref 70–99)
Glucose-Capillary: 141 mg/dL — ABNORMAL HIGH (ref 70–99)

## 2021-06-22 LAB — BASIC METABOLIC PANEL
Anion gap: 5 (ref 5–15)
BUN: 16 mg/dL (ref 8–23)
CO2: 25 mmol/L (ref 22–32)
Calcium: 8.4 mg/dL — ABNORMAL LOW (ref 8.9–10.3)
Chloride: 107 mmol/L (ref 98–111)
Creatinine, Ser: 1.31 mg/dL — ABNORMAL HIGH (ref 0.44–1.00)
GFR, Estimated: 46 mL/min — ABNORMAL LOW (ref >60)
Glucose, Bld: 127 mg/dL — ABNORMAL HIGH (ref 70–99)
Potassium: 4.2 mmol/L (ref 3.5–5.1)
Sodium: 137 mmol/L (ref 135–145)

## 2021-06-22 MED ORDER — MIRTAZAPINE 45 MG PO TABS: 45.0000 mg | ORAL_TABLET | Freq: Every day | ORAL | 0 refills | Status: DC

## 2021-06-22 MED ORDER — POLYETHYLENE GLYCOL 3350 17 G PO PACK: 17.0000 g | PACK | Freq: Every day | ORAL | 0 refills | Status: DC | PRN

## 2021-06-22 MED ORDER — SUCRALFATE 1 GM/10ML PO SUSP: 1.0000 g | Freq: Three times a day (TID) | ORAL | 0 refills | Status: DC

## 2021-06-22 MED ORDER — OXYCODONE HCL 20 MG PO TABS: 20.0000 mg | ORAL_TABLET | Freq: Four times a day (QID) | ORAL | Status: DC

## 2021-06-22 MED ORDER — TRESIBA FLEXTOUCH 200 UNIT/ML ~~LOC~~ SOPN: 25.0000 [IU] | PEN_INJECTOR | Freq: Every morning | SUBCUTANEOUS | Status: DC

## 2021-06-22 MED ORDER — OXYCODONE HCL 20 MG PO TABS: 10.0000 mg | ORAL_TABLET | Freq: Four times a day (QID) | ORAL | 0 refills | Status: DC

## 2021-06-22 MED ORDER — APIXABAN 5 MG PO TABS: ORAL_TABLET | ORAL | 0 refills | Status: DC

## 2021-06-22 NOTE — Discharge Summary (Signed)
Physician Discharge Summary  Lindsey [name] PXL:[id number] DOB: September 13, 1963 DOA: 06/17/2021  PCP: Sherron Monday, MD  Admit date: 06/17/2021 Discharge date: 06/22/2021  Admitted From: Home Discharge disposition: Home   Code Status: Full Code   Discharge Diagnosis:   Active Problems:   Dyspnea   Acute pulmonary embolism without acute cor pulmonale [facility])    Chief Complaint  Patient presents with   Chest Pain    Brief narrative: Lindsey [name] is a 60 y.o. female with PMH significant for PUD, GERD, morbid obesity s/p Roux-en-Y gastric bypass 2006, CVA, CAD s/p MI, IDT2DM, HTN, HLD and chronic pain and recent admission for suspicion of unintentional opioid overdose requiring intubation. Patient presented to the ED on 7/22 with complaint of chest pain, dyspnea, cough, epigastric pain as well as few days of melanotic stools.  In the ED, vital signs were stable.  Labs showed hemoglobin normal at 13, troponin negative, COVID PCR positive.  Chest x-ray did not show any infiltrates.  CT abd/pelvis revealed no acute findings, stable hiatal hernia containing anastomotic suture.  Admitted to hospital service for further evaluation management 7/23, V/Q scan (due to contrast allergy) showed multiple perfusion defects consistent with pulmonary emboli for which heparin was started.  GI was also consulted because of epigastric pain, deferred endoscopic evaluation at this time.  Subjective: Patient was seen and examined this morning. Gastric pain improving.  No evidence of bleeding.  Hemoglobin stable.  Feels comfortable to go home today.  Hospital course Acute pulmonary embolism -7/23, VQ scan showed multiple moderate perfusion defects throughout bilateral upper lobes, right more than left. -DVT scan of lower extremities negative. -She was initially started on heparin drip.  After 72 hours of heparin drip, I switched her to oral Eliquis on 7/25.  Continue Eliquis at home for at least  3 to 6 months.  To follow-up with PCP for repeat scan.  Severe epigastric abdominal pain History of gastric bypass surgery -While in the hospital, she continued to have epigastric pain.  She also had melena prior to presentation.   -Last endoscopy normal about a year ago  -CT abdomen pelvis on admission reassuring for any acute intra abdominal/intra pelvic pathology.   -GI consultation was obtained because of severity of symptoms.   -Discussed again with GI Dr. Tobi Bastos t on 7/26.Marland Kitchen  Patient has stable hemoglobin.  She is already on Protonix twice daily.  She has increased risk of negative outcome with endoscopy at this time because of concomitant COVID infection, and acute pulmonary embolism on anticoagulation -Carafate was added after which her pain is improved.   -Also on bowel regimen to avoid constipation. -Continue Protonix.  Covid-19 infection -COVID PCR positive on admission without symptoms.  Fully vaccinated.   -Isolation for 10 days since diagnosis. -Given 3 days of remdesivir.   Type 2 diabetes mellitus -A1c 7.3 on 7/10 -Currently on Lantus 25 units daily with sliding scale insulin.  On hold are metformin, Farxiga, Onglyza, semaglutide, Tresiba -Blood sugar level consistently less than 150. -At discharge, I would resume [name] at 25 units daily and keep others on hold. Recent Labs  Lab 06/21/21 0813 06/21/21 1122 06/21/21 1624 06/21/21 2125 06/22/21 0808  GLUCAP 107* 144* 141* 129* 115*    CAD/HLD  -negative troponin, no ischemic ECG changes noted. -Not on aspirin. Continue BB. Continue statin   Essential hypertension -Home meds include clonidine 0.1 mg at bedtime, Benicar/HCTZ 20 mg / 12.5 mg daily.  -Currently normotensive without blood pressure meds.  -Continue  monitor blood pressure at home and resume meds gradually if systolic blood pressure is over 140.   Anxiety: - Continue buspar, very low dose xanax  Chronic pain -Chronically on oxycodone.   Allergies  as of 06/22/2021       Reactions   Morphine Hives   Iodinated Diagnostic Agents Hives   Aspirin Hives   Noted on MD progress notes and discussed with MD 09/02/15   Etodolac Hives   Ibuprofen Hives   Shellfish Allergy Hives   Tylenol [acetaminophen] Hives   Upset stomach         Medication List     STOP taking these medications    cloNIDine 0.1 MG tablet Commonly known as: CATAPRES   dapagliflozin propanediol 10 MG Tabs tablet Commonly known as: FARXIGA   metFORMIN 500 MG 24 hr tablet Commonly known as: GLUCOPHAGE-XR   metFORMIN 750 MG 24 hr tablet Commonly known as: GLUCOPHAGE-XR   olmesartan-hydrochlorothiazide 20-12.5 MG tablet Commonly known as: BENICAR HCT   potassium chloride 10 MEQ tablet Commonly known as: KLOR-CON   saxagliptin HCl 5 MG Tabs tablet Commonly known as: ONGLYZA   Semaglutide 14 MG Tabs       TAKE these medications    albuterol 108 (90 Base) MCG/ACT inhaler Commonly known as: VENTOLIN HFA Inhale 2 puffs into the lungs every 4 (four) hours as needed for wheezing or shortness of breath. What changed: Another medication with the same name was removed. Continue taking this medication, and follow the directions you see here.   ALPRAZolam 0.25 MG tablet Commonly known as: XANAX Take 0.25 mg by mouth 3 (three) times daily.   apixaban 5 MG Tabs tablet Commonly known as: ELIQUIS 10 mg twice daily for first 7 days followed by 5 mg twice daily.   atorvastatin 40 MG tablet Commonly known as: LIPITOR Take 40 mg by mouth daily.   Belsomra 15 MG Tabs Generic drug: Suvorexant Take 1 tablet by mouth at bedtime.   busPIRone 7.5 MG tablet Commonly known as: BUSPAR Take 7.5 mg by mouth 2 (two) times daily.   diclofenac Sodium 1 % Gel Commonly known as: VOLTAREN Apply 4 g topically See admin instructions. Apply 4 grams twice a week to neck.   esomeprazole 40 MG capsule Commonly known as: NEXIUM Take 40 mg by mouth 2 (two) times daily.    ferrous sulfate 325 (65 FE) MG tablet Take 1 tablet (325 mg total) by mouth 2 (two) times daily with a meal.   fexofenadine 180 MG tablet Commonly known as: ALLEGRA Take 180 mg by mouth daily.   fluticasone 50 MCG/ACT nasal spray Commonly known as: FLONASE Place 1 spray into both nostrils daily.   folic acid 1 MG tablet Commonly known as: FOLVITE Take 1 mg by mouth daily.   hydrOXYzine 25 MG tablet Commonly known as: ATARAX/VISTARIL Take 25 mg by mouth 3 (three) times daily.   meclizine 25 MG tablet Commonly known as: ANTIVERT Take 1 tablet (25 mg total) by mouth 3 (three) times daily as needed for dizziness or nausea.   metoprolol succinate 100 MG 24 hr tablet Commonly known as: TOPROL-XL Take 100 mg by mouth daily.   mirtazapine 45 MG tablet Commonly known as: REMERON Take 1 tablet (45 mg total) by mouth at bedtime for 7 days.   naloxone 4 MG/0.1ML Liqd nasal spray kit Commonly known as: NARCAN One intranasal for altered mental status   oxybutynin 5 MG tablet Commonly known as: DITROPAN Take 5 mg by mouth  2 (two) times daily.   Oxycodone HCl 20 MG Tabs Take 1 tablet (20 mg total) by mouth 4 (four) times daily.   OYSTER SHELL CALCIUM 500 + D PO Take 1 tablet by mouth daily.   polyethylene glycol 17 g packet Commonly known as: MIRALAX / GLYCOLAX Take 17 g by mouth daily as needed for mild constipation.   sucralfate 1 GM/10ML suspension Commonly known as: CARAFATE Take 10 mLs (1 g total) by mouth 4 (four) times daily -  with meals and at bedtime for 14 days.   Tradjenta 5 MG Tabs tablet Generic drug: linagliptin Take 5 mg by mouth daily.   Tyler Aas FlexTouch 200 UNIT/ML FlexTouch Pen Generic drug: insulin degludec Inject 26 Units into the skin every morning. What changed: how much to take   Ubrelvy 100 MG Tabs Generic drug: Ubrogepant Take 100 mg by mouth as needed.   zolpidem 10 MG tablet Commonly known as: AMBIEN Take 0.5 tablets (5 mg total) by  mouth at bedtime as needed for sleep.        Discharge Instructions:  Diet Recommendation: Cardiac/diabetic diet   Follow with Primary MD Jodi Marble, MD in 7 days   Get CBC/BMP checked in next visit within 1 week by PCP or SNF MD ( we routinely change or add medications that can affect your baseline labs and fluid status, therefore we recommend that you get the mentioned basic workup next visit with your PCP, your PCP may decide not to get them or add new tests based on their clinical decision)  On your next visit with your PCP, please Get Medicines reviewed and adjusted.  Please request your PCP  to go over all Hospital Tests and Procedure/Radiological results at the follow up, please get all Hospital records sent to your Prim MD by signing hospital release before you go home.  Activity: As tolerated with Full fall precautions use walker/cane & assistance as needed  For Heart failure patients - Check your Weight same time everyday, if you gain over 2 pounds, or you develop in leg swelling, experience more shortness of breath or chest pain, call your Primary MD immediately. Follow Cardiac Low Salt Diet and 1.5 lit/day fluid restriction.  If you have smoked or chewed Tobacco in the last 2 yrs please stop smoking, stop any regular Alcohol  and or any Recreational drug use.  If you experience worsening of your admission symptoms, develop shortness of breath, life threatening emergency, suicidal or homicidal thoughts you must seek medical attention immediately by calling 911 or calling your MD immediately  if symptoms less severe.  You Must read complete instructions/literature along with all the possible adverse reactions/side effects for all the Medicines you take and that have been prescribed to you. Take any new Medicines after you have completely understood and accpet all the possible adverse reactions/side effects.   Do not drive, operate heavy machinery, perform activities at  heights, swimming or participation in water activities or provide baby sitting services if your were admitted for syncope or siezures until you have seen by Primary MD or a Neurologist and advised to do so again.  Do not drive when taking Pain medications.  Do not take more than prescribed Pain, Sleep and Anxiety Medications  Wear Seat belts while driving.   Please note You were cared for by a hospitalist during your hospital stay. If you have any questions about your discharge medications or the care you received while you were in the hospital after  you are discharged, you can call the unit and asked to speak with the hospitalist on call if the hospitalist that took care of you is not available. Once you are discharged, your primary care physician will handle any further medical issues. Please note that NO REFILLS for any discharge medications will be authorized once you are discharged, as it is imperative that you return to your primary care physician (or establish a relationship with a primary care physician if you do not have one) for your aftercare needs so that they can reassess your need for medications and monitor your lab values.    Follow ups:    Follow-up Information     Jodi Marble, MD Follow up.   Specialty: Internal Medicine Contact information: [address] [place] [address] [phone]                 Wound care:     Discharge Exam:   Vitals:   06/21/21 2010 06/22/21 0523 06/22/21 0807 06/22/21 0908  BP: 99/65 (!) 117/54 93/63 125/67  Pulse: 79 (!) 56 64 74  Resp: $Remo'20 18 18   'gGkjy$ Temp: 98.8 F (37.1 C) 98.3 F (36.8 C) 98.4 F (36.9 C)   TempSrc: Oral Oral    SpO2: 97% 96% 96%   Weight:      Height:        Body mass index is 39.16 kg/m.  General exam: Pleasant, middle-aged African-American female.  Not in distress Skin: No rashes, lesions or ulcers. HEENT: Atraumatic, normocephalic, no obvious bleeding Lungs: Clear to auscultation  bilaterally CVS: Regular rate and rhythm, no murmur GI/Abd soft, mild epigastric tenderness, bowel sound present CNS: Alert, awake, oriented x3 Psychiatry: Mood appropriate Extremities: No pedal edema, no calf tenderness  Time coordinating discharge: 35 minutes   The results of significant diagnostics from this hospitalization (including imaging, microbiology, ancillary and laboratory) are listed below for reference.    Procedures and Diagnostic Studies:   ECHOCARDIOGRAM COMPLETE  Result Date: 06/19/2021    ECHOCARDIOGRAM REPORT   Patient Name:   Lindsey [name] Date of Exam: 06/19/2021 Medical Rec #:  [id number]        Height:       67.0 in Accession #:    [id number]       Weight:       250.0 lb Date of Birth:  [birth year]        BSA:          2.223 m Patient Age:    67 years         BP:           124/69 mmHg Patient Gender: F                HR:           80 bpm. Exam Location:  [facility] Procedure: 2D Echo Indications:     Pulmonary Embolus  History:         Patient has prior history of Echocardiogram examinations. COPD;                  Risk Factors:Hypertension and Dyslipidemia.  Sonographer:     L Thornton-Maynard Referring Phys:  [place] Diagnosing Phys: Dorris Carnes MD IMPRESSIONS  1. Left ventricular ejection fraction, by estimation, is 55 to 60%. The left ventricle has normal function. The left ventricle has no regional wall motion abnormalities. There is mild left ventricular hypertrophy. Left ventricular diastolic parameters are indeterminate.  2.  Right ventricular systolic function is normal. The right ventricular size is mildly enlarged.  3. Right atrial size was mildly dilated.  4. The mitral valve is normal in structure. Trivial mitral valve regurgitation.  5. The aortic valve is normal in structure. Aortic valve regurgitation is not visualized. FINDINGS  Left Ventricle: Left ventricular ejection fraction, by estimation, is 55 to 60%. The left ventricle has normal function. The left  ventricle has no regional wall motion abnormalities. The left ventricular internal cavity size was normal in size. There is  mild left ventricular hypertrophy. Left ventricular diastolic parameters are indeterminate. Right Ventricle: The right ventricular size is mildly enlarged. Right vetricular wall thickness was not assessed. Right ventricular systolic function is normal. Left Atrium: Left atrial size was normal in size. Right Atrium: Right atrial size was mildly dilated. Pericardium: There is no evidence of pericardial effusion. Mitral Valve: The mitral valve is normal in structure. Trivial mitral valve regurgitation. Tricuspid Valve: The tricuspid valve is normal in structure. Tricuspid valve regurgitation is mild. Aortic Valve: The aortic valve is normal in structure. Aortic valve regurgitation is not visualized. Aortic valve peak gradient measures 7.4 mmHg. Pulmonic Valve: The pulmonic valve was not well visualized. Pulmonic valve regurgitation is not visualized. No evidence of pulmonic stenosis. Aorta: The aortic root is normal in size and structure. IAS/Shunts: The interatrial septum was not assessed.  LEFT VENTRICLE PLAX 2D LVIDd:         4.82 cm  Diastology LVIDs:         3.12 cm  LV e' medial:    6.74 cm/s LV PW:         1.35 cm  LV E/e' medial:  8.8 LV IVS:        1.40 cm  LV e' lateral:   8.49 cm/s LVOT diam:     2.20 cm  LV E/e' lateral: 7.0 LV SV:         75 LV SV Index:   34 LVOT Area:     3.80 cm  RIGHT VENTRICLE RV S prime:     12.90 cm/s TAPSE (M-mode): 2.6 cm LEFT ATRIUM             Index       RIGHT ATRIUM           Index LA diam:        3.40 cm 1.53 cm/m  RA Area:     22.20 cm LA Vol (A2C):   58.0 ml 26.09 ml/m RA Volume:   67.60 ml  30.40 ml/m LA Vol (A4C):   50.9 ml 22.89 ml/m LA Biplane Vol: 56.1 ml 25.23 ml/m  AORTIC VALVE                PULMONIC VALVE AV Area (Vmax): 3.49 cm    PV Vmax:       0.89 m/s AV Vmax:        136.00 cm/s PV Peak grad:  3.2 mmHg AV Peak Grad:   7.4 mmHg  LVOT Vmax:      125.00 cm/s LVOT Vmean:     76.000 cm/s LVOT VTI:       0.198 m  AORTA Ao Root diam: 3.40 cm MITRAL VALVE MV Area (PHT): 4.49 cm    SHUNTS MV E velocity: 59.60 cm/s  Systemic VTI:  0.20 m MV A velocity: 81.80 cm/s  Systemic Diam: 2.20 cm MV E/A ratio:  0.73 Dorris Carnes MD Electronically signed by Dorris Carnes MD Signature Date/Time: 06/19/2021/2:34:53  PM    Final      Labs:   Basic Metabolic Panel: Recent Labs  Lab 06/17/21 1452 06/19/21 0731 06/21/21 0503 06/22/21 0551  NA 140 139 141 137  K 4.6 4.2 4.0 4.2  CL 113* 109 112* 107  CO2 $Re'23 24 27 25  'pDS$ GLUCOSE 178* 99 94 127*  BUN 25* $Remov'21 15 16  'swRKVG$ CREATININE 1.17* 1.37* 1.25* 1.31*  CALCIUM 8.6* 8.7* 8.7* 8.4*   GFR Estimated Creatinine Clearance: 57.9 mL/min (A) (by C-G formula based on SCr of 1.31 mg/dL (H)). Liver Function Tests: Recent Labs  Lab 06/19/21 0731  AST 26  ALT 27  ALKPHOS 90  BILITOT 0.7  PROT 5.7*  ALBUMIN 2.8*   No results for input(s): LIPASE, AMYLASE in the last 168 hours. No results for input(s): AMMONIA in the last 168 hours. Coagulation profile Recent Labs  Lab 06/18/21 1336  INR 1.1    CBC: Recent Labs  Lab 06/17/21 1452 06/19/21 0731 06/20/21 0405 06/21/21 0503 06/22/21 0551  WBC 8.2 7.5 7.3 7.4 6.0  NEUTROABS  --   --   --  4.2 2.7  HGB 13.0 11.9* 11.3* 11.4* 11.3*  HCT 40.7 37.2 35.1* 35.5* 34.8*  MCV 89.1 89.2 88.9 87.4 88.5  PLT 384 310 287 274 274   Cardiac Enzymes: No results for input(s): CKTOTAL, CKMB, CKMBINDEX, TROPONINI in the last 168 hours. BNP: Invalid input(s): POCBNP CBG: Recent Labs  Lab 06/21/21 0813 06/21/21 1122 06/21/21 1624 06/21/21 2125 06/22/21 0808  GLUCAP 107* 144* 141* 129* 115*   D-Dimer No results for input(s): DDIMER in the last 72 hours. Hgb A1c No results for input(s): HGBA1C in the last 72 hours. Lipid Profile No results for input(s): CHOL, HDL, LDLCALC, TRIG, CHOLHDL, LDLDIRECT in the last 72 hours. Thyroid function  studies No results for input(s): TSH, T4TOTAL, T3FREE, THYROIDAB in the last 72 hours.  Invalid input(s): FREET3 Anemia work up No results for input(s): VITAMINB12, FOLATE, FERRITIN, TIBC, IRON, RETICCTPCT in the last 72 hours. Microbiology Recent Results (from the past 240 hour(s))  Resp Panel by RT-PCR (Flu A&B, Covid) Nasopharyngeal Swab     Status: Abnormal   Collection Time: 06/17/21  8:52 PM   Specimen: Nasopharyngeal Swab; Nasopharyngeal(NP) swabs in vial transport medium  Result Value Ref Range Status   SARS Coronavirus 2 by RT PCR POSITIVE (A) NEGATIVE Final    Comment: RESULT CALLED TO, READ BACK BY AND VERIFIED WITH: LEXI OLIVER @ 2154 06/17/21 LFD (NOTE) SARS-CoV-2 target nucleic acids are DETECTED.  The SARS-CoV-2 RNA is generally detectable in upper respiratory specimens during the acute phase of infection. Positive results are indicative of the presence of the identified virus, but do not rule out bacterial infection or co-infection with other pathogens not detected by the test. Clinical correlation with patient history and other diagnostic information is necessary to determine patient infection status. The expected result is Negative.  Fact Sheet for Patients: EntrepreneurPulse.com.au  Fact Sheet for Healthcare Providers: IncredibleEmployment.be  This test is not yet approved or cleared by the [place] FDA and  has been authorized for detection and/or diagnosis of SARS-CoV-2 by FDA under an Emergency Use Authorization (EUA).  This EUA will remain in effect (meaning this test can be u sed) for the duration of  the COVID-19 declaration under Section 564(b)(1) of the Act, 21 U.S.C. section 360bbb-3(b)(1), unless the authorization is terminated or revoked sooner.     Influenza A by PCR NEGATIVE NEGATIVE Final   Influenza B  by PCR NEGATIVE NEGATIVE Final    Comment: (NOTE) The Xpert Xpress SARS-CoV-2/FLU/RSV plus assay is  intended as an aid in the diagnosis of influenza from Nasopharyngeal swab specimens and should not be used as a sole basis for treatment. Nasal washings and aspirates are unacceptable for Xpert Xpress SARS-CoV-2/FLU/RSV testing.  Fact Sheet for Patients: EntrepreneurPulse.com.au  Fact Sheet for Healthcare Providers: IncredibleEmployment.be  This test is not yet approved or cleared by the [place] FDA and has been authorized for detection and/or diagnosis of SARS-CoV-2 by FDA under an Emergency Use Authorization (EUA). This EUA will remain in effect (meaning this test can be used) for the duration of the COVID-19 declaration under Section 564(b)(1) of the Act, 21 U.S.C. section 360bbb-3(b)(1), unless the authorization is terminated or revoked.  Performed at [facility], [address], [place], [place] [address]      Signed: Terrilee Croak  [facility] 06/22/2021, 11:40 AM

## 2021-06-22 NOTE — Progress Notes (Signed)
Patient given instructions to keep follow up appointment, when to return for worsening symptoms, Ivs taken out, patient to pick up prescriptions, PCP aware of the patient needing oxycodone & PCP MD unable to fill the script till Friday, Hospitalist was able to give prescription, & patient awaiting transport at 1800.

## 2021-06-22 NOTE — Progress Notes (Signed)
Discharged via wheel chair with family

## 2021-07-01 DIAGNOSIS — G8929 Other chronic pain: Secondary | ICD-10-CM | POA: Diagnosis not present

## 2021-07-01 DIAGNOSIS — I639 Cerebral infarction, unspecified: Secondary | ICD-10-CM | POA: Diagnosis not present

## 2021-07-01 DIAGNOSIS — I10 Essential (primary) hypertension: Secondary | ICD-10-CM | POA: Diagnosis not present

## 2021-07-01 DIAGNOSIS — G4700 Insomnia, unspecified: Secondary | ICD-10-CM | POA: Diagnosis not present

## 2021-07-01 DIAGNOSIS — G43009 Migraine without aura, not intractable, without status migrainosus: Secondary | ICD-10-CM | POA: Diagnosis not present

## 2021-07-01 DIAGNOSIS — I959 Hypotension, unspecified: Secondary | ICD-10-CM | POA: Diagnosis not present

## 2021-07-01 DIAGNOSIS — U071 COVID-19: Secondary | ICD-10-CM | POA: Diagnosis not present

## 2021-07-04 DIAGNOSIS — R0902 Hypoxemia: Secondary | ICD-10-CM | POA: Diagnosis not present

## 2021-07-19 DIAGNOSIS — J45998 Other asthma: Secondary | ICD-10-CM | POA: Diagnosis not present

## 2021-07-19 DIAGNOSIS — J301 Allergic rhinitis due to pollen: Secondary | ICD-10-CM | POA: Diagnosis not present

## 2021-07-19 DIAGNOSIS — G4700 Insomnia, unspecified: Secondary | ICD-10-CM | POA: Diagnosis not present

## 2021-07-19 DIAGNOSIS — I639 Cerebral infarction, unspecified: Secondary | ICD-10-CM | POA: Diagnosis not present

## 2021-07-19 DIAGNOSIS — M542 Cervicalgia: Secondary | ICD-10-CM | POA: Diagnosis not present

## 2021-07-19 DIAGNOSIS — G43009 Migraine without aura, not intractable, without status migrainosus: Secondary | ICD-10-CM | POA: Diagnosis not present

## 2021-07-19 DIAGNOSIS — E119 Type 2 diabetes mellitus without complications: Secondary | ICD-10-CM | POA: Diagnosis not present

## 2021-07-19 DIAGNOSIS — G8929 Other chronic pain: Secondary | ICD-10-CM | POA: Diagnosis not present

## 2021-08-04 DIAGNOSIS — R0902 Hypoxemia: Secondary | ICD-10-CM | POA: Diagnosis not present

## 2021-08-11 DIAGNOSIS — E119 Type 2 diabetes mellitus without complications: Secondary | ICD-10-CM | POA: Diagnosis not present

## 2021-09-03 DIAGNOSIS — R0902 Hypoxemia: Secondary | ICD-10-CM | POA: Diagnosis not present

## 2021-09-16 DIAGNOSIS — G8929 Other chronic pain: Secondary | ICD-10-CM | POA: Diagnosis not present

## 2021-09-16 DIAGNOSIS — I639 Cerebral infarction, unspecified: Secondary | ICD-10-CM | POA: Diagnosis not present

## 2021-09-16 DIAGNOSIS — J45998 Other asthma: Secondary | ICD-10-CM | POA: Diagnosis not present

## 2021-09-16 DIAGNOSIS — E119 Type 2 diabetes mellitus without complications: Secondary | ICD-10-CM | POA: Diagnosis not present

## 2021-09-16 DIAGNOSIS — G43009 Migraine without aura, not intractable, without status migrainosus: Secondary | ICD-10-CM | POA: Diagnosis not present

## 2021-10-04 DIAGNOSIS — R0902 Hypoxemia: Secondary | ICD-10-CM | POA: Diagnosis not present

## 2021-10-17 DIAGNOSIS — E119 Type 2 diabetes mellitus without complications: Secondary | ICD-10-CM | POA: Diagnosis not present

## 2021-10-17 DIAGNOSIS — G8929 Other chronic pain: Secondary | ICD-10-CM | POA: Diagnosis not present

## 2021-10-17 DIAGNOSIS — I639 Cerebral infarction, unspecified: Secondary | ICD-10-CM | POA: Diagnosis not present

## 2021-10-17 DIAGNOSIS — G43009 Migraine without aura, not intractable, without status migrainosus: Secondary | ICD-10-CM | POA: Diagnosis not present

## 2021-10-17 DIAGNOSIS — G4700 Insomnia, unspecified: Secondary | ICD-10-CM | POA: Diagnosis not present

## 2021-11-03 DIAGNOSIS — R0902 Hypoxemia: Secondary | ICD-10-CM | POA: Diagnosis not present

## 2021-11-16 DIAGNOSIS — G4700 Insomnia, unspecified: Secondary | ICD-10-CM | POA: Diagnosis not present

## 2021-11-16 DIAGNOSIS — G894 Chronic pain syndrome: Secondary | ICD-10-CM | POA: Diagnosis not present

## 2021-11-16 DIAGNOSIS — G43009 Migraine without aura, not intractable, without status migrainosus: Secondary | ICD-10-CM | POA: Diagnosis not present

## 2021-11-16 DIAGNOSIS — E119 Type 2 diabetes mellitus without complications: Secondary | ICD-10-CM | POA: Diagnosis not present

## 2021-11-16 DIAGNOSIS — I10 Essential (primary) hypertension: Secondary | ICD-10-CM | POA: Diagnosis not present

## 2021-11-16 DIAGNOSIS — G8929 Other chronic pain: Secondary | ICD-10-CM | POA: Diagnosis not present

## 2021-11-16 DIAGNOSIS — I639 Cerebral infarction, unspecified: Secondary | ICD-10-CM | POA: Diagnosis not present

## 2021-11-16 DIAGNOSIS — E7800 Pure hypercholesterolemia, unspecified: Secondary | ICD-10-CM | POA: Diagnosis not present

## 2021-12-02 DIAGNOSIS — E119 Type 2 diabetes mellitus without complications: Secondary | ICD-10-CM | POA: Diagnosis not present

## 2021-12-04 DIAGNOSIS — R0902 Hypoxemia: Secondary | ICD-10-CM | POA: Diagnosis not present

## 2021-12-05 DIAGNOSIS — E1122 Type 2 diabetes mellitus with diabetic chronic kidney disease: Secondary | ICD-10-CM | POA: Diagnosis not present

## 2021-12-05 DIAGNOSIS — E119 Type 2 diabetes mellitus without complications: Secondary | ICD-10-CM | POA: Diagnosis not present

## 2021-12-05 DIAGNOSIS — G4700 Insomnia, unspecified: Secondary | ICD-10-CM | POA: Diagnosis not present

## 2021-12-05 DIAGNOSIS — I639 Cerebral infarction, unspecified: Secondary | ICD-10-CM | POA: Diagnosis not present

## 2021-12-05 DIAGNOSIS — I10 Essential (primary) hypertension: Secondary | ICD-10-CM | POA: Diagnosis not present

## 2021-12-05 DIAGNOSIS — G8929 Other chronic pain: Secondary | ICD-10-CM | POA: Diagnosis not present

## 2021-12-05 DIAGNOSIS — J45998 Other asthma: Secondary | ICD-10-CM | POA: Diagnosis not present

## 2021-12-05 DIAGNOSIS — G43009 Migraine without aura, not intractable, without status migrainosus: Secondary | ICD-10-CM | POA: Diagnosis not present

## 2021-12-16 DIAGNOSIS — G43009 Migraine without aura, not intractable, without status migrainosus: Secondary | ICD-10-CM | POA: Diagnosis not present

## 2021-12-16 DIAGNOSIS — I639 Cerebral infarction, unspecified: Secondary | ICD-10-CM | POA: Diagnosis not present

## 2021-12-16 DIAGNOSIS — E119 Type 2 diabetes mellitus without complications: Secondary | ICD-10-CM | POA: Diagnosis not present

## 2021-12-16 DIAGNOSIS — G4700 Insomnia, unspecified: Secondary | ICD-10-CM | POA: Diagnosis not present

## 2021-12-16 DIAGNOSIS — G8929 Other chronic pain: Secondary | ICD-10-CM | POA: Diagnosis not present

## 2022-01-04 DIAGNOSIS — R0902 Hypoxemia: Secondary | ICD-10-CM | POA: Diagnosis not present

## 2022-01-09 DIAGNOSIS — S92501A Displaced unspecified fracture of right lesser toe(s), initial encounter for closed fracture: Secondary | ICD-10-CM | POA: Diagnosis not present

## 2022-01-09 DIAGNOSIS — S92504A Nondisplaced unspecified fracture of right lesser toe(s), initial encounter for closed fracture: Secondary | ICD-10-CM | POA: Diagnosis not present

## 2022-01-16 DIAGNOSIS — S92504A Nondisplaced unspecified fracture of right lesser toe(s), initial encounter for closed fracture: Secondary | ICD-10-CM | POA: Diagnosis not present

## 2022-01-16 DIAGNOSIS — G8929 Other chronic pain: Secondary | ICD-10-CM | POA: Diagnosis not present

## 2022-01-16 DIAGNOSIS — I10 Essential (primary) hypertension: Secondary | ICD-10-CM | POA: Diagnosis not present

## 2022-01-16 DIAGNOSIS — E119 Type 2 diabetes mellitus without complications: Secondary | ICD-10-CM | POA: Diagnosis not present

## 2022-01-16 DIAGNOSIS — G4700 Insomnia, unspecified: Secondary | ICD-10-CM | POA: Diagnosis not present

## 2022-01-16 DIAGNOSIS — G43009 Migraine without aura, not intractable, without status migrainosus: Secondary | ICD-10-CM | POA: Diagnosis not present

## 2022-01-16 DIAGNOSIS — I639 Cerebral infarction, unspecified: Secondary | ICD-10-CM | POA: Diagnosis not present

## 2022-01-16 DIAGNOSIS — S92504D Nondisplaced unspecified fracture of right lesser toe(s), subsequent encounter for fracture with routine healing: Secondary | ICD-10-CM | POA: Diagnosis not present

## 2022-02-01 DIAGNOSIS — R0902 Hypoxemia: Secondary | ICD-10-CM | POA: Diagnosis not present

## 2022-02-08 DIAGNOSIS — E119 Type 2 diabetes mellitus without complications: Secondary | ICD-10-CM | POA: Diagnosis not present

## 2022-02-14 DIAGNOSIS — G4700 Insomnia, unspecified: Secondary | ICD-10-CM | POA: Diagnosis not present

## 2022-02-14 DIAGNOSIS — E7800 Pure hypercholesterolemia, unspecified: Secondary | ICD-10-CM | POA: Diagnosis not present

## 2022-02-14 DIAGNOSIS — G43009 Migraine without aura, not intractable, without status migrainosus: Secondary | ICD-10-CM | POA: Diagnosis not present

## 2022-02-14 DIAGNOSIS — I639 Cerebral infarction, unspecified: Secondary | ICD-10-CM | POA: Diagnosis not present

## 2022-02-14 DIAGNOSIS — E119 Type 2 diabetes mellitus without complications: Secondary | ICD-10-CM | POA: Diagnosis not present

## 2022-02-14 DIAGNOSIS — S92504D Nondisplaced unspecified fracture of right lesser toe(s), subsequent encounter for fracture with routine healing: Secondary | ICD-10-CM | POA: Diagnosis not present

## 2022-02-14 DIAGNOSIS — G8929 Other chronic pain: Secondary | ICD-10-CM | POA: Diagnosis not present

## 2022-03-01 ENCOUNTER — Other Ambulatory Visit: Payer: Self-pay | Admitting: Internal Medicine

## 2022-03-01 DIAGNOSIS — Z1231 Encounter for screening mammogram for malignant neoplasm of breast: Secondary | ICD-10-CM

## 2022-03-04 DIAGNOSIS — R0902 Hypoxemia: Secondary | ICD-10-CM | POA: Diagnosis not present

## 2022-04-02 ENCOUNTER — Observation Stay
Admission: EM | Admit: 2022-04-02 | Discharge: 2022-04-03 | Disposition: A | Discharge status: Home / Self Care | Payer: Medicare Other | Attending: Internal Medicine | Admitting: Internal Medicine

## 2022-04-02 ENCOUNTER — Other Ambulatory Visit: Payer: Self-pay

## 2022-04-02 ENCOUNTER — Encounter: Payer: Self-pay | Admitting: Emergency Medicine

## 2022-04-02 ENCOUNTER — Emergency Department: Payer: Medicare Other

## 2022-04-02 DIAGNOSIS — Z96659 Presence of unspecified artificial knee joint: Secondary | ICD-10-CM | POA: Insufficient documentation

## 2022-04-02 DIAGNOSIS — I10 Essential (primary) hypertension: Secondary | ICD-10-CM | POA: Diagnosis present

## 2022-04-02 DIAGNOSIS — N179 Acute kidney failure, unspecified: Secondary | ICD-10-CM | POA: Diagnosis not present

## 2022-04-02 DIAGNOSIS — Z794 Long term (current) use of insulin: Secondary | ICD-10-CM | POA: Diagnosis not present

## 2022-04-02 DIAGNOSIS — Z7984 Long term (current) use of oral hypoglycemic drugs: Secondary | ICD-10-CM | POA: Diagnosis not present

## 2022-04-02 DIAGNOSIS — N3000 Acute cystitis without hematuria: Secondary | ICD-10-CM | POA: Insufficient documentation

## 2022-04-02 DIAGNOSIS — Z20822 Contact with and (suspected) exposure to covid-19: Secondary | ICD-10-CM | POA: Insufficient documentation

## 2022-04-02 DIAGNOSIS — N189 Chronic kidney disease, unspecified: Secondary | ICD-10-CM | POA: Diagnosis not present

## 2022-04-02 DIAGNOSIS — I639 Cerebral infarction, unspecified: Secondary | ICD-10-CM | POA: Diagnosis present

## 2022-04-02 DIAGNOSIS — I129 Hypertensive chronic kidney disease with stage 1 through stage 4 chronic kidney disease, or unspecified chronic kidney disease: Secondary | ICD-10-CM | POA: Insufficient documentation

## 2022-04-02 DIAGNOSIS — E1129 Type 2 diabetes mellitus with other diabetic kidney complication: Secondary | ICD-10-CM | POA: Diagnosis present

## 2022-04-02 DIAGNOSIS — I2510 Atherosclerotic heart disease of native coronary artery without angina pectoris: Secondary | ICD-10-CM | POA: Diagnosis present

## 2022-04-02 DIAGNOSIS — F418 Other specified anxiety disorders: Secondary | ICD-10-CM | POA: Diagnosis not present

## 2022-04-02 DIAGNOSIS — R519 Headache, unspecified: Secondary | ICD-10-CM | POA: Insufficient documentation

## 2022-04-02 DIAGNOSIS — E1122 Type 2 diabetes mellitus with diabetic chronic kidney disease: Secondary | ICD-10-CM | POA: Diagnosis not present

## 2022-04-02 DIAGNOSIS — E785 Hyperlipidemia, unspecified: Secondary | ICD-10-CM | POA: Diagnosis not present

## 2022-04-02 DIAGNOSIS — N1831 Chronic kidney disease, stage 3a: Secondary | ICD-10-CM | POA: Diagnosis not present

## 2022-04-02 DIAGNOSIS — Z79899 Other long term (current) drug therapy: Secondary | ICD-10-CM | POA: Diagnosis not present

## 2022-04-02 DIAGNOSIS — Z7901 Long term (current) use of anticoagulants: Secondary | ICD-10-CM | POA: Insufficient documentation

## 2022-04-02 DIAGNOSIS — I2699 Other pulmonary embolism without acute cor pulmonale: Secondary | ICD-10-CM | POA: Diagnosis present

## 2022-04-02 DIAGNOSIS — Z6841 Body Mass Index (BMI) 40.0 and over, adult: Secondary | ICD-10-CM | POA: Diagnosis not present

## 2022-04-02 DIAGNOSIS — Z8673 Personal history of transient ischemic attack (TIA), and cerebral infarction without residual deficits: Secondary | ICD-10-CM | POA: Insufficient documentation

## 2022-04-02 DIAGNOSIS — R531 Weakness: Secondary | ICD-10-CM | POA: Diagnosis not present

## 2022-04-02 DIAGNOSIS — N390 Urinary tract infection, site not specified: Secondary | ICD-10-CM | POA: Diagnosis present

## 2022-04-02 DIAGNOSIS — J449 Chronic obstructive pulmonary disease, unspecified: Secondary | ICD-10-CM | POA: Diagnosis not present

## 2022-04-02 LAB — CBC
HCT: 44 % (ref 36.0–46.0)
Hemoglobin: 13.2 g/dL (ref 12.0–15.0)
MCH: 26.1 pg (ref 26.0–34.0)
MCHC: 30 g/dL (ref 30.0–36.0)
MCV: 87.1 fL (ref 80.0–100.0)
Platelets: 387 10*3/uL (ref 150–400)
RBC: 5.05 MIL/uL (ref 3.87–5.11)
RDW: 14.7 % (ref 11.5–15.5)
WBC: 9.2 10*3/uL (ref 4.0–10.5)
nRBC: 0 % (ref 0.0–0.2)

## 2022-04-02 LAB — URINALYSIS, ROUTINE W REFLEX MICROSCOPIC
Glucose, UA: 50 mg/dL — AB
Ketones, ur: NEGATIVE mg/dL
Nitrite: NEGATIVE
Protein, ur: 30 mg/dL — AB
Specific Gravity, Urine: 1.027 (ref 1.005–1.030)
WBC, UA: >50 WBC/hpf — ABNORMAL HIGH (ref 0–5)
pH: 5 (ref 5.0–8.0)

## 2022-04-02 LAB — HEPATIC FUNCTION PANEL
ALT: 30 U/L (ref 0–44)
AST: 25 U/L (ref 15–41)
Albumin: 3.4 g/dL — ABNORMAL LOW (ref 3.5–5.0)
Alkaline Phosphatase: 120 U/L (ref 38–126)
Bilirubin, Direct: 0.2 mg/dL (ref 0.0–0.2)
Indirect Bilirubin: 0.3 mg/dL (ref 0.3–0.9)
Total Bilirubin: 0.5 mg/dL (ref 0.3–1.2)
Total Protein: 7.1 g/dL (ref 6.5–8.1)

## 2022-04-02 LAB — BASIC METABOLIC PANEL
Anion gap: 8 (ref 5–15)
BUN: 41 mg/dL — ABNORMAL HIGH (ref 8–23)
CO2: 25 mmol/L (ref 22–32)
Calcium: 8.9 mg/dL (ref 8.9–10.3)
Chloride: 110 mmol/L (ref 98–111)
Creatinine, Ser: 2.08 mg/dL — ABNORMAL HIGH (ref 0.44–1.00)
GFR, Estimated: 26 mL/min — ABNORMAL LOW (ref >60)
Glucose, Bld: 102 mg/dL — ABNORMAL HIGH (ref 70–99)
Potassium: 4.1 mmol/L (ref 3.5–5.1)
Sodium: 143 mmol/L (ref 135–145)

## 2022-04-02 LAB — RESP PANEL BY RT-PCR (FLU A&B, COVID) ARPGX2
Influenza A by PCR: NEGATIVE
Influenza B by PCR: NEGATIVE
SARS Coronavirus 2 by RT PCR: NEGATIVE

## 2022-04-02 LAB — GLUCOSE, CAPILLARY
Glucose-Capillary: 211 mg/dL — ABNORMAL HIGH (ref 70–99)
Glucose-Capillary: 154 mg/dL — ABNORMAL HIGH (ref 70–99)

## 2022-04-02 LAB — LACTIC ACID, PLASMA: Lactic Acid, Venous: 0.9 mmol/L (ref 0.5–1.9)

## 2022-04-02 MED ORDER — SUMATRIPTAN SUCCINATE 50 MG PO TABS: 50.0000 mg | ORAL_TABLET | ORAL | Status: DC | PRN

## 2022-04-02 MED ORDER — ONDANSETRON HCL 4 MG/2ML IJ SOLN: 4.0000 mg | Freq: Three times a day (TID) | INTRAMUSCULAR | Status: DC | PRN

## 2022-04-02 MED ORDER — ALBUTEROL SULFATE (2.5 MG/3ML) 0.083% IN NEBU: 3.0000 mL | INHALATION_SOLUTION | RESPIRATORY_TRACT | Status: DC | PRN

## 2022-04-02 MED ORDER — DM-GUAIFENESIN ER 30-600 MG PO TB12
1.0000 | ORAL_TABLET | Freq: Two times a day (BID) | ORAL | Status: DC | PRN
  Filled 2022-04-02: qty 1

## 2022-04-02 MED ORDER — PANTOPRAZOLE SODIUM 40 MG PO TBEC
40.0000 mg | DELAYED_RELEASE_TABLET | Freq: Every day | ORAL | Status: DC
Start: 2022-04-02 — End: 2022-04-03
  Administered 2022-04-02 – 2022-04-03 (×2): 40 mg via ORAL
  Filled 2022-04-02 (×2): qty 1

## 2022-04-02 MED ORDER — HYDROXYZINE HCL 25 MG PO TABS: 25.0000 mg | ORAL_TABLET | Freq: Three times a day (TID) | ORAL | Status: DC

## 2022-04-02 MED ORDER — FLUTICASONE PROPIONATE 50 MCG/ACT NA SUSP
1.0000 | Freq: Every day | NASAL | Status: DC
  Administered 2022-04-02 – 2022-04-03 (×2): 1 via NASAL
  Filled 2022-04-02: qty 16

## 2022-04-02 MED ORDER — POLYETHYLENE GLYCOL 3350 17 G PO PACK
17.0000 g | PACK | Freq: Every day | ORAL | Status: DC | PRN
  Administered 2022-04-02 – 2022-04-03 (×2): 17 g via ORAL
  Filled 2022-04-02 (×2): qty 1

## 2022-04-02 MED ORDER — MECLIZINE HCL 25 MG PO TABS
25.0000 mg | ORAL_TABLET | Freq: Three times a day (TID) | ORAL | Status: DC | PRN
  Administered 2022-04-02 – 2022-04-03 (×2): 25 mg via ORAL
  Filled 2022-04-02 (×4): qty 1

## 2022-04-02 MED ORDER — LACTATED RINGERS IV BOLUS
1000.0000 mL | Freq: Once | INTRAVENOUS | Status: AC
  Administered 2022-04-02: 1000 mL via INTRAVENOUS

## 2022-04-02 MED ORDER — FENTANYL CITRATE PF 50 MCG/ML IJ SOSY
50.0000 ug | PREFILLED_SYRINGE | Freq: Once | INTRAMUSCULAR | Status: AC
  Administered 2022-04-02: 50 ug via INTRAVENOUS
  Filled 2022-04-02: qty 1

## 2022-04-02 MED ORDER — LORATADINE 10 MG PO TABS
10.0000 mg | ORAL_TABLET | Freq: Every day | ORAL | Status: DC
Start: 2022-04-02 — End: 2022-04-03
  Administered 2022-04-02 – 2022-04-03 (×2): 10 mg via ORAL
  Filled 2022-04-02 (×2): qty 1

## 2022-04-02 MED ORDER — NALOXONE HCL 0.4 MG/ML IJ SOLN: 0.4000 mg | INTRAMUSCULAR | Status: DC | PRN

## 2022-04-02 MED ORDER — CLONIDINE HCL 0.1 MG PO TABS
0.1000 mg | ORAL_TABLET | Freq: Two times a day (BID) | ORAL | Status: DC
  Administered 2022-04-02: 0.1 mg via ORAL
  Filled 2022-04-02: qty 1

## 2022-04-02 MED ORDER — SODIUM CHLORIDE 0.9 % IV SOLN
2.0000 g | Freq: Once | INTRAVENOUS | Status: AC
  Administered 2022-04-02: 2 g via INTRAVENOUS
  Filled 2022-04-02: qty 20

## 2022-04-02 MED ORDER — OYSTER SHELL CALCIUM/D3 500-5 MG-MCG PO TABS
ORAL_TABLET | Freq: Every day | ORAL | Status: DC
Start: 2022-04-02 — End: 2022-04-03
  Administered 2022-04-02 – 2022-04-03 (×2): 1 via ORAL
  Filled 2022-04-02 (×2): qty 1

## 2022-04-02 MED ORDER — HYDRALAZINE HCL 20 MG/ML IJ SOLN: 5.0000 mg | INTRAMUSCULAR | Status: DC | PRN

## 2022-04-02 MED ORDER — INSULIN DEGLUDEC 200 UNIT/ML ~~LOC~~ SOPN: 15.0000 mL | PEN_INJECTOR | Freq: Every morning | SUBCUTANEOUS | Status: DC

## 2022-04-02 MED ORDER — LACTATED RINGERS IV BOLUS: 1000.0000 mL | Freq: Once | INTRAVENOUS | Status: DC

## 2022-04-02 MED ORDER — MIRTAZAPINE 15 MG PO TABS
45.0000 mg | ORAL_TABLET | Freq: Every day | ORAL | Status: DC
  Administered 2022-04-02: 45 mg via ORAL
  Filled 2022-04-02: qty 3

## 2022-04-02 MED ORDER — METOCLOPRAMIDE HCL 5 MG/ML IJ SOLN
10.0000 mg | Freq: Once | INTRAMUSCULAR | Status: AC
  Administered 2022-04-02: 10 mg via INTRAVENOUS
  Filled 2022-04-02: qty 2

## 2022-04-02 MED ORDER — OXYCODONE HCL 5 MG PO TABS
20.0000 mg | ORAL_TABLET | Freq: Once | ORAL | Status: AC
  Administered 2022-04-02: 20 mg via ORAL
  Filled 2022-04-02: qty 4

## 2022-04-02 MED ORDER — SODIUM CHLORIDE 0.9 % IV SOLN
2.0000 g | INTRAVENOUS | Status: DC
  Filled 2022-04-02: qty 20

## 2022-04-02 MED ORDER — ALPRAZOLAM 0.25 MG PO TABS
0.2500 mg | ORAL_TABLET | Freq: Three times a day (TID) | ORAL | Status: DC | PRN
  Administered 2022-04-02 – 2022-04-03 (×3): 0.25 mg via ORAL
  Filled 2022-04-02 (×3): qty 1

## 2022-04-02 MED ORDER — SODIUM CHLORIDE 0.9 % IV SOLN: 2.0000 g | INTRAVENOUS | Status: DC

## 2022-04-02 MED ORDER — OXYBUTYNIN CHLORIDE 5 MG PO TABS
5.0000 mg | ORAL_TABLET | Freq: Two times a day (BID) | ORAL | Status: DC
  Administered 2022-04-02 – 2022-04-03 (×3): 5 mg via ORAL
  Filled 2022-04-02 (×3): qty 1

## 2022-04-02 MED ORDER — HYDROXYZINE HCL 25 MG PO TABS
25.0000 mg | ORAL_TABLET | Freq: Two times a day (BID) | ORAL | Status: DC
  Administered 2022-04-02 – 2022-04-03 (×2): 25 mg via ORAL
  Filled 2022-04-02 (×2): qty 1

## 2022-04-02 MED ORDER — HEPARIN SODIUM (PORCINE) 5000 UNIT/ML IJ SOLN
5000.0000 [IU] | Freq: Three times a day (TID) | INTRAMUSCULAR | Status: DC
  Administered 2022-04-02 – 2022-04-03 (×2): 5000 [IU] via SUBCUTANEOUS
  Filled 2022-04-02 (×2): qty 1

## 2022-04-02 MED ORDER — INSULIN ASPART 100 UNIT/ML IJ SOLN
0.0000 [IU] | Freq: Three times a day (TID) | INTRAMUSCULAR | Status: DC
  Administered 2022-04-02: 3 [IU] via SUBCUTANEOUS
  Administered 2022-04-03: 1 [IU] via SUBCUTANEOUS
  Administered 2022-04-03: 2 [IU] via SUBCUTANEOUS
  Filled 2022-04-02 (×3): qty 1

## 2022-04-02 MED ORDER — LIDOCAINE 5 % EX PTCH
1.0000 | MEDICATED_PATCH | CUTANEOUS | Status: DC
  Administered 2022-04-02: 1 via TRANSDERMAL
  Filled 2022-04-02: qty 1

## 2022-04-02 MED ORDER — DIPHENHYDRAMINE HCL 50 MG/ML IJ SOLN
25.0000 mg | Freq: Once | INTRAMUSCULAR | Status: AC
  Administered 2022-04-02: 25 mg via INTRAVENOUS
  Filled 2022-04-02: qty 1

## 2022-04-02 MED ORDER — INSULIN ASPART 100 UNIT/ML IJ SOLN: 0.0000 [IU] | Freq: Every day | INTRAMUSCULAR | Status: DC

## 2022-04-02 MED ORDER — ATORVASTATIN CALCIUM 20 MG PO TABS
40.0000 mg | ORAL_TABLET | Freq: Every day | ORAL | Status: DC
  Administered 2022-04-02: 40 mg via ORAL
  Filled 2022-04-02 (×2): qty 2

## 2022-04-02 MED ORDER — FOLIC ACID 1 MG PO TABS
1.0000 mg | ORAL_TABLET | Freq: Every day | ORAL | Status: DC
  Administered 2022-04-02 – 2022-04-03 (×2): 1 mg via ORAL
  Filled 2022-04-02 (×2): qty 1

## 2022-04-02 MED ORDER — ZOLPIDEM TARTRATE 5 MG PO TABS: 5.0000 mg | ORAL_TABLET | Freq: Every evening | ORAL | Status: DC | PRN

## 2022-04-02 MED ORDER — METOPROLOL SUCCINATE ER 50 MG PO TB24
100.0000 mg | ORAL_TABLET | Freq: Every day | ORAL | Status: DC
  Administered 2022-04-02: 100 mg via ORAL
  Filled 2022-04-02 (×2): qty 2

## 2022-04-02 MED ORDER — BUSPIRONE HCL 15 MG PO TABS
7.5000 mg | ORAL_TABLET | Freq: Two times a day (BID) | ORAL | Status: DC
  Administered 2022-04-02 – 2022-04-03 (×2): 7.5 mg via ORAL
  Filled 2022-04-02 (×3): qty 1

## 2022-04-02 MED ORDER — UBROGEPANT 100 MG PO TABS: 100.0000 mg | ORAL_TABLET | ORAL | Status: DC | PRN

## 2022-04-02 MED ORDER — APIXABAN 5 MG PO TABS: 5.0000 mg | ORAL_TABLET | Freq: Two times a day (BID) | ORAL | Status: DC

## 2022-04-02 MED ORDER — INSULIN GLARGINE-YFGN 100 UNIT/ML ~~LOC~~ SOLN
18.0000 [IU] | Freq: Every day | SUBCUTANEOUS | Status: DC
  Administered 2022-04-03: 18 [IU] via SUBCUTANEOUS
  Filled 2022-04-02 (×2): qty 0.18

## 2022-04-02 MED ORDER — FERROUS SULFATE 325 (65 FE) MG PO TABS
325.0000 mg | ORAL_TABLET | Freq: Two times a day (BID) | ORAL | Status: DC
  Administered 2022-04-02 – 2022-04-03 (×2): 325 mg via ORAL
  Filled 2022-04-02 (×2): qty 1

## 2022-04-02 MED ORDER — ALPRAZOLAM 0.5 MG PO TABS: 0.2500 mg | ORAL_TABLET | Freq: Three times a day (TID) | ORAL | Status: DC

## 2022-04-02 NOTE — H&P (Signed)
?History and Physical  ? ? ?Lindsey [name] YYT:[id number] DOB: Mar 08, 1961 DOA: 04/02/2022 ? ?Referring MD/NP/PA:  ? ?PCP: Jodi Marble, MD  ? ?Patient coming from:  The patient is coming from home.  At baseline, pt is independent for most of ADL.       ? ?Chief Complaint: Generalized weakness, chills, poor appetite, headache ? ?HPI: Lindsey [name] is a 61 y.o. female with medical history significant of hypertension, hyperlipidemia, diabetes mellitus, COPD, stroke, GERD, depression, anxiety, spinal cord stimulator, PUD, CAD, OSA not on CPAP, obesity with BMI 40.72, headache, CKD-3A, PE (just finished course of Eliquis treatment this week), who presents with generalized weakness, chills, poor appetite, headache. ? ?Patient states that he has not been feeling good for 2 days.  He reports generalized weakness, chills, poor appetite, decreased oral intake.  She feels shaky.  Denies chest pain, cough, shortness breath.  No fever.  No nausea vomiting, diarrhea or abdominal pain.  Denies dysuria or burning on urination, no increased urinary frequency, but states that urine smells bad.  Initially patient reports severe headache on the top of her head, which has resolved currently.  Patient states that she has history of PE, but completed course of Eliquis this week.  Her Eliquis was discontinued by her PCP. ? ? ?Data Reviewed and ED Course: pt was found to have WBC 9.2, negative COVID PCR, urinalysis (cloudy appearance, large amount of leukocyte, many bacteria, WBC> 50, with 11-20 squamous cells), worsening renal function, lactic acid 0.9, temperature 99.1, blood pressure 104/76, heart rate 88, RR 20, oxygen saturation 100% on room air.  CT of head is negative for acute intracranial abnormalities.  Patient is placed on MedSurg bed for observation. ? ?EKG: I have personally reviewed.  Sinus rhythm, QTc 422, low voltage, T wave inversion in V1-V2, lead I/aVL, poor R wave progression. ? ? ?Review of Systems:  ? ?General:  no fevers, has chills, no body weight gain, has poor appetite, has fatigue ?HEENT: no blurry vision, hearing changes or sore throat ?Respiratory: no dyspnea, coughing, wheezing ?CV: no chest pain, no palpitations ?GI: no nausea, vomiting, abdominal pain, diarrhea, constipation ?GU: no dysuria, burning on urination, increased urinary frequency, hematuria  ?Ext: no leg edema ?Neuro: no unilateral weakness, numbness, or tingling, no vision change or hearing loss. Has HA ?Skin: no rash, no skin tear. ?MSK: No muscle spasm, no deformity, no limitation of range of movement in spin ?Heme: No easy bruising.  ?Travel history: No recent long distant travel. ? ? ?Allergy:  ?Allergies  ?Allergen Reactions  ? Morphine Hives  ? Iodinated Contrast Media Hives  ? Aspirin Hives  ?  Noted on MD progress notes and discussed with MD 09/02/15  ? Etodolac Hives  ? Ibuprofen Hives  ? Shellfish Allergy Hives  ? Tylenol [Acetaminophen] Hives  ?  Upset stomach   ? ? ?Past Medical History:  ?Diagnosis Date  ? Allergy   ? Anemia   ? Arthritis   ? knees  ? COPD (chronic obstructive pulmonary disease) ([place])   ? Diabetes mellitus without complication ([place])   ? diet controlled  ? GERD (gastroesophageal reflux disease)   ? Heart attack [facility]) 2002  ? Hyperlipidemia   ? Hypertension   ? PUD (peptic ulcer disease)   ? Sleep apnea   ? can't tolerate CPAP.  Uses O2 only  ? Spinal cord stimulator status   ? for lower back pain  ? Stroke [facility]) 2004  ? "mini-stroke"  ? Vertigo   ?  Wears dentures   ? full upper and lower  ? ? ?Past Surgical History:  ?Procedure Laterality Date  ? ABDOMINAL HYSTERECTOMY    ? back surgery    ? broken wrist    ? COLONOSCOPY WITH PROPOFOL N/A 01/30/2017  ? Procedure: COLONOSCOPY WITH PROPOFOL;  Surgeon: Jonathon Bellows, MD;  Location: [facility] ENDOSCOPY;  Service: Endoscopy;  Laterality: N/A;  ? COLONOSCOPY WITH PROPOFOL N/A 02/20/2017  ? Procedure: Colonoscopy with propofol ;  Surgeon: Jonathon Bellows, MD;  Location: [facility] ENDOSCOPY;  Service:  Endoscopy;  Laterality: N/A;  ? COLONOSCOPY WITH PROPOFOL N/A 03/13/2017  ? Procedure: COLONOSCOPY WITH PROPOFOL;  Surgeon: Jonathon Bellows, MD;  Location: [facility] ENDOSCOPY;  Service: Endoscopy;  Laterality: N/A;  ? COLONOSCOPY WITH PROPOFOL N/A 04/17/2017  ? Procedure: COLONOSCOPY WITH PROPOFOL;  Surgeon: Jonathon Bellows, MD;  Location: [facility] ENDOSCOPY;  Service: Endoscopy;  Laterality: N/A;  ? ESOPHAGOGASTRODUODENOSCOPY N/A 05/05/2020  ? Procedure: ESOPHAGOGASTRODUODENOSCOPY (EGD);  Surgeon: Toledo, Benay Pike, MD;  Location: [facility] ENDOSCOPY;  Service: Gastroenterology;  Laterality: N/A;  ? ESOPHAGOGASTRODUODENOSCOPY (EGD) WITH PROPOFOL N/A 09/28/2016  ? Procedure: ESOPHAGOGASTRODUODENOSCOPY (EGD) WITH PROPOFOL;  Surgeon: Manya Silvas, MD;  Location: [facility] ENDOSCOPY;  Service: Endoscopy;  Laterality: N/A;  ? ESOPHAGOGASTRODUODENOSCOPY (EGD) WITH PROPOFOL N/A 03/13/2017  ? Procedure: ESOPHAGOGASTRODUODENOSCOPY (EGD) WITH PROPOFOL;  Surgeon: Jonathon Bellows, MD;  Location: [facility] ENDOSCOPY;  Service: Endoscopy;  Laterality: N/A;  ? GALLBLADDER SURGERY    ? GASTRIC BYPASS    ? GIVENS CAPSULE STUDY N/A 03/13/2017  ? Procedure: GIVENS CAPSULE STUDY;  Surgeon: Jonathon Bellows, MD;  Location: [facility] ENDOSCOPY;  Service: Endoscopy;  Laterality: N/A;  ? HERNIA REPAIR    ? JOINT REPLACEMENT  1995  ? REPLACEMENT TOTAL KNEE    ? ROTATOR CUFF REPAIR    ? ? ?Social History:  reports that she has never smoked. She has never used smokeless tobacco. She reports that she does not drink alcohol and does not use drugs. ? ?Family History:  ?Family History  ?Problem Relation Age of Onset  ? Hypertension Father   ? COPD Father   ? Heart disease Father   ? Breast cancer Neg Hx   ?  ? ?Prior to Admission medications   ?Medication Sig Start Date End Date Taking? Authorizing Provider  ?albuterol (PROVENTIL HFA;VENTOLIN HFA) 108 (90 BASE) MCG/ACT inhaler Inhale 2 puffs into the lungs every 4 (four) hours as needed for wheezing or shortness of breath.    [provider]  ?ALPRAZolam Duanne Moron) 0.25 MG tablet Take 0.25 mg by mouth 3 (three) times daily.    [provider]  ?apixaban (ELIQUIS) 5 MG TABS tablet 10 mg twice daily for first 7 days followed by 5 mg twice daily. 06/22/21   Terrilee Croak, MD  ?atorvastatin (LIPITOR) 40 MG tablet Take 40 mg by mouth daily. 03/18/21   [provider]  ?busPIRone (BUSPAR) 7.5 MG tablet Take 7.5 mg by mouth 2 (two) times daily.    [provider]  ?Calcium Carbonate-Vitamin D (OYSTER SHELL CALCIUM 500 + D PO) Take 1 tablet by mouth daily. 12/26/16   [provider]  ?diclofenac Sodium (VOLTAREN) 1 % GEL Apply 4 g topically See admin instructions. Apply 4 grams twice a week to neck.    [provider]  ?esomeprazole (NEXIUM) 40 MG capsule Take 40 mg by mouth 2 (two) times daily.    [provider]  ?ferrous sulfate 325 (65 FE) MG tablet Take 1 tablet (325 mg total) by mouth 2 (  two) times daily with a meal. 12/26/16   Hillary Bow, MD  ?fexofenadine (ALLEGRA) 180 MG tablet Take 180 mg by mouth daily.  01/25/17   [provider]  ?fluticasone (FLONASE) 50 MCG/ACT nasal spray Place 1 spray into both nostrils daily.    [provider]  ?folic acid (FOLVITE) 1 MG tablet Take 1 mg by mouth daily.    [provider]  ?hydrOXYzine (ATARAX/VISTARIL) 25 MG tablet Take 25 mg by mouth 3 (three) times daily. 06/13/21   [provider]  ?meclizine (ANTIVERT) 25 MG tablet Take 1 tablet (25 mg total) by mouth 3 (three) times daily as needed for dizziness or nausea. 01/23/17   Earleen Newport, MD  ?metoprolol succinate (TOPROL-XL) 100 MG 24 hr tablet Take 100 mg by mouth daily. 06/01/18   [provider]  ?mirtazapine (REMERON) 45 MG tablet Take 1 tablet (45 mg total) by mouth at bedtime for 7 days. 06/22/21 06/29/21  Terrilee Croak, MD  ?naloxone Karma Greaser) nasal spray 4 mg/0.1 mL One intranasal for altered mental status 06/07/21   Loletha Grayer, MD   ?oxybutynin (DITROPAN) 5 MG tablet Take 5 mg by mouth 2 (two) times daily. 03/18/13   [provider]  ?polyethylene glycol (MIRALAX / GLYCOLAX) 17 g packet Take 17 g by mouth daily as needed for mild constip

## 2022-04-02 NOTE — Assessment & Plan Note (Signed)
Stable -Bronchodilators 

## 2022-04-02 NOTE — Assessment & Plan Note (Signed)
-   Continue home medications 

## 2022-04-02 NOTE — ED Notes (Signed)
Pt ambulatory to restroom without any assistance. Pt given urine cup to provide urine sample.  ?

## 2022-04-02 NOTE — Assessment & Plan Note (Signed)
Baseline creatinine 1.31 recently, her creatinine is at 2.08, BUN 41. Likely due to dehydration and continuation of Voltaren gel and olmesartan.  Patient also has possible UTI. ?-Placed on MedSurg bed for observation. ?-IV fluid: 2 L LR ?-Hold olmesartan and Voltaren gel ?

## 2022-04-02 NOTE — Assessment & Plan Note (Signed)
No chest pain ?-Lipitor ?

## 2022-04-02 NOTE — ED Notes (Signed)
Pt ambulatory to bathroom at this time.

## 2022-04-02 NOTE — ED Notes (Signed)
Dr. Ellender Hose at bedside for reevaluation ?

## 2022-04-02 NOTE — Assessment & Plan Note (Signed)
-  Continue Lipitor °

## 2022-04-02 NOTE — Assessment & Plan Note (Signed)
Likely migraine headache.  Headache has resolved. ?-As needed Ubrelvy ?

## 2022-04-02 NOTE — ED Notes (Signed)
Pt states her headache is unchanged and the medication did not help. Dr. Bland Span, MD made aware.  ?

## 2022-04-02 NOTE — Plan of Care (Signed)
  Problem: Education: Goal: Knowledge of General Education information will improve Description Including pain rating scale, medication(s)/side effects and non-pharmacologic comfort measures Outcome: Progressing   

## 2022-04-02 NOTE — Assessment & Plan Note (Signed)
Patient states that she finished 7- month course of Eliquis this week.  Patient does not have shortness of breath or chest pain. ?-No acute issues. ?

## 2022-04-02 NOTE — Assessment & Plan Note (Signed)
?  BMI= 4.72  and BW=117.9 ?-Diet and exercise.   ?-Encouraged to lose weight. ? ?

## 2022-04-02 NOTE — Progress Notes (Signed)
? ?      CROSS COVER NOTE ? ?NAME: Lindsey [name] ?MRN: [id number] ?DOB : [birth year] ? ?Secure chat received from nursing reporting 9/10 back and hip pain. PDMP checked. Home oxycodone ordered x1. PRN narcan ordered. Lidocaine patch ordered. ? ? ?Neomia Glass MHA, MSN, FNP-BC ?Nurse Practitioner ?[facility] ?[place] ?Pager [phone] ? ? ?

## 2022-04-02 NOTE — Assessment & Plan Note (Signed)
Lipitor 

## 2022-04-02 NOTE — Assessment & Plan Note (Signed)
-   IV hydralazine as needed ?-Clonidine, metoprolol ?-Hold olmesartan due to worsening renal function ?

## 2022-04-02 NOTE — ED Triage Notes (Signed)
Pt to ED via POV, pt c/o HA, dizziness, and bilateral leg weakness. Pt ambulatory to triage room at this time. Pt states symptoms started yesterday. Pt A&O x4, able to answer all questions and speak in full and complete sentences on arrival.  ?

## 2022-04-02 NOTE — Assessment & Plan Note (Signed)
Patient has possible UTI.  Urinalysis is positive for UTI, but has squamous cell contamination. ?-IV Rocephin started ?-Follow-up urine culture.  If culture is negative, will discontinue antibiotics. ?

## 2022-04-02 NOTE — Assessment & Plan Note (Signed)
Recent A1c 7.3, poorly controlled.  Patient is taking Tresiba 26 units daily ?-Glargine insulin 18 units daily ?-Sliding scale insulin ?

## 2022-04-02 NOTE — ED Provider Notes (Addendum)
? ?[facility] ?Provider Note ? ? ? Event Date/Time  ? First MD Initiated Contact with Patient 04/02/22 [phone]   ?  (approximate) ? ? ?History  ? ?Dizziness and Weakness ? ? ?HPI ? ?Lindsey [name] is a 61 y.o. female with past medical history significant for gastric bypass, CVA, CAD, diabetes, hypertension, here with generalized weakness.  The patient states that for the last 2 days or so, she has felt generally weak.  She has felt shaky.  She has been having chills.  She had somewhat decreased appetite.  She has had some lightheadedness with standing.  Reports she is also noticed darker than usual urine, though no overt dysuria.  Reports no other specific complaints.  No cough.  No sputum production.  No known sick contacts.  No recent medication changes.  She does admit she is fairly chronically dehydrated due to poor p.o. intake after her bypass, but this is not necessarily new.  No recent medication changes.  No abdominal pain. ?  ? ? ?Physical Exam  ? ?Triage Vital Signs: ?ED Triage Vitals  ?Enc Vitals Group  ?   BP 04/02/22 0454 100/80  ?   Pulse Rate 04/02/22 0454 82  ?   Resp 04/02/22 0454 20  ?   Temp 04/02/22 0454 99.1 ?F (37.3 ?C)  ?   Temp Source 04/02/22 0454 Oral  ?   SpO2 04/02/22 0454 96 %  ?   Weight 04/02/22 0448 260 lb (117.9 kg)  ?   Height 04/02/22 0448 '5\' 7"'$  (1.702 m)  ?   Head Circumference --   ?   Peak Flow --   ?   Pain Score 04/02/22 0448 9  ?   Pain Loc --   ?   Pain Edu? --   ?   Excl. in [place]? --   ? ? ?Most recent vital signs: ?Vitals:  ? 04/02/22 0800 04/02/22 0930  ?BP: 106/67 104/76  ?Pulse: 88 62  ?Resp: 20 20  ?Temp:    ?SpO2: 100% 100%  ? ? ? ?General: Awake, no distress.  ?CV:  Good peripheral perfusion.  Regular rate and rhythm.  No murmurs. ?Resp:  Normal effort.  Lungs clear to auscultation bilaterally. ?Abd:  No distention.  No overt tenderness to palpation. ?Other:  Dry mucous membranes.  Current nerves II through XII intact.  Strength on 5 bilateral  upper and lower extremities.  No new focal neurological deficits. ? ? ?ED Results / Procedures / Treatments  ? ?Labs ?(all labs ordered are listed, but only abnormal results are displayed) ?Labs Reviewed  ?BASIC METABOLIC PANEL - Abnormal; Notable for the following components:  ?    Result Value  ? Glucose, Bld 102 (*)   ? BUN 41 (*)   ? Creatinine, Ser 2.08 (*)   ? GFR, Estimated 26 (*)   ? All other components within normal limits  ?URINALYSIS, ROUTINE W REFLEX MICROSCOPIC - Abnormal; Notable for the following components:  ? Color, Urine AMBER (*)   ? APPearance CLOUDY (*)   ? Glucose, UA 50 (*)   ? Hgb urine dipstick SMALL (*)   ? Bilirubin Urine SMALL (*)   ? Protein, ur 30 (*)   ? Leukocytes,Ua LARGE (*)   ? WBC, UA >50 (*)   ? Bacteria, UA MANY (*)   ? All other components within normal limits  ?HEPATIC FUNCTION PANEL - Abnormal; Notable for the following components:  ? Albumin 3.4 (*)   ?  All other components within normal limits  ?RESP PANEL BY RT-PCR (FLU A&B, COVID) ARPGX2  ?CULTURE, BLOOD (SINGLE)  ?URINE CULTURE  ?CBC  ?LACTIC ACID, PLASMA  ?CBG MONITORING, ED  ? ? ? ?EKG ?Normal sinus rhythm, ventricular rate 82.  PR 158, QRS 92, QTc 422.  No acute ST elevations or depressions. ? ? ?RADIOLOGY ?CT head: No acute abnormality ? ? ?I also independently reviewed and agree with radiologist interpretations. ? ? ?PROCEDURES: ? ?Critical Care performed: No ? ? ?MEDICATIONS ORDERED IN ED: ?Medications  ?lactated ringers bolus 1,000 mL (has no administration in time range)  ?cefTRIAXone (ROCEPHIN) 2 g in sodium chloride 0.9 % 100 mL IVPB (2 g Intravenous New Bag/Given 04/02/22 1026)  ?fentaNYL (SUBLIMAZE) injection 50 mcg (has no administration in time range)  ?lactated ringers bolus 1,000 mL (0 mLs Intravenous Stopped 04/02/22 1026)  ?metoCLOPramide (REGLAN) injection 10 mg (10 mg Intravenous Given 04/02/22 0837)  ?diphenhydrAMINE (BENADRYL) injection 25 mg (25 mg Intravenous Given 04/02/22 0837)  ? ? ? ?IMPRESSION / MDM  / ASSESSMENT AND PLAN / ED COURSE  ?I reviewed the triage vital signs and the nursing notes. ?             ?               ? ? ?The patient is on the cardiac monitor to evaluate for evidence of arrhythmia and/or significant heart rate changes. ? ? ?Ddx:  ?Generalized weakness secondary to dehydration, UTI, anemia, polypharmacy, acute on chronic kidney injury, occult infection ? ?Headache likely secondary t dehydration as above, with history of chronic headaches.  Not acute in onset or thunderclap, doubt subarachnoid.  No meningismus or stiffness to suggest meningitis or encephalitis. ? ? ?MDM:  ?61 year old female here with generalized weakness, headache.  Suspect dehydration with generalized weakness in the setting of likely UTI.  She has had some urinary symptoms and appears dehydrated clinically with borderline fever, hypotension, and orthostasis.  Patient given IV fluids and labs sent.  Urinalysis is consistent with UTI with significant pyuria.  Many bacteria noted.  CBC shows no leukocytosis.  BMP is consistent with acute on chronic kidney injury with elevated BUN and creatinine above baseline.  Lactic acid normal.  Abdomen is soft.  CT head obtained, reviewed, shows no acute abnormality.  She does have persistent headache, but I suspect this is more so acute on chronic headache versus meningitis encephalitis that she has no other symptoms to suggest this.  Moreover, she is on anticoagulation so cannot perform lumbar puncture.  Plan to continue to treat symptomatically for headache, fluid resuscitate and treat her dehydration and infection, and reassess. ? ? ?MEDICATIONS GIVEN IN ED: ?Medications  ?lactated ringers bolus 1,000 mL (has no administration in time range)  ?cefTRIAXone (ROCEPHIN) 2 g in sodium chloride 0.9 % 100 mL IVPB (2 g Intravenous New Bag/Given 04/02/22 1026)  ?fentaNYL (SUBLIMAZE) injection 50 mcg (has no administration in time range)  ?lactated ringers bolus 1,000 mL (0 mLs Intravenous Stopped  04/02/22 1026)  ?metoCLOPramide (REGLAN) injection 10 mg (10 mg Intravenous Given 04/02/22 0837)  ?diphenhydrAMINE (BENADRYL) injection 25 mg (25 mg Intravenous Given 04/02/22 0837)  ? ? ? ?Consults:  ?Hospitalist consulted for admission ? ? ?EMR reviewed  ?Reviewed discharge summary from admission 05/2021, where she was admitted for acute PE in the setting of COVID-19 ? ? ? ? ?FINAL CLINICAL IMPRESSION(S) / ED DIAGNOSES  ? ?Final diagnoses:  ?Acute renal failure superimposed on chronic kidney disease, unspecified  CKD stage, unspecified acute renal failure type ([place])  ?Generalized headache  ?Acute cystitis without hematuria  ? ? ? ?Rx / DC Orders  ? ?ED Discharge Orders   ? ? None  ? ?  ? ? ? ?Note:  This document was prepared using Dragon voice recognition software and may include unintentional dictation errors. ?  ?Duffy Bruce, MD ?04/02/22 1039 ? ?  ?Duffy Bruce, MD ?04/02/22 1040 ? ?

## 2022-04-02 NOTE — ED Notes (Signed)
Pt presents to the ED from home. Pt states that starting yesterday she has been having chills and headache since yesterday. States she has a hx of migraines but this does not feel similar. Pt states that she said last time this happened or felt this way it was 5-6 years ago and her B12 was low and she was getting injections. Denies any fever. Denies NVD. Denies any pain other than the headache. Pt is A&Ox4 and NAD.  ?

## 2022-04-03 ENCOUNTER — Other Ambulatory Visit: Payer: Self-pay | Admitting: Internal Medicine

## 2022-04-03 DIAGNOSIS — R0902 Hypoxemia: Secondary | ICD-10-CM | POA: Diagnosis not present

## 2022-04-03 DIAGNOSIS — N179 Acute kidney failure, unspecified: Secondary | ICD-10-CM | POA: Diagnosis not present

## 2022-04-03 DIAGNOSIS — N1831 Chronic kidney disease, stage 3a: Secondary | ICD-10-CM | POA: Diagnosis not present

## 2022-04-03 LAB — BASIC METABOLIC PANEL
Anion gap: 3 — ABNORMAL LOW (ref 5–15)
BUN: 38 mg/dL — ABNORMAL HIGH (ref 8–23)
CO2: 26 mmol/L (ref 22–32)
Calcium: 8.4 mg/dL — ABNORMAL LOW (ref 8.9–10.3)
Chloride: 111 mmol/L (ref 98–111)
Creatinine, Ser: 1.66 mg/dL — ABNORMAL HIGH (ref 0.44–1.00)
GFR, Estimated: 34 mL/min — ABNORMAL LOW (ref >60)
Glucose, Bld: 114 mg/dL — ABNORMAL HIGH (ref 70–99)
Potassium: 4.4 mmol/L (ref 3.5–5.1)
Sodium: 140 mmol/L (ref 135–145)

## 2022-04-03 LAB — GLUCOSE, CAPILLARY
Glucose-Capillary: 141 mg/dL — ABNORMAL HIGH (ref 70–99)
Glucose-Capillary: 174 mg/dL — ABNORMAL HIGH (ref 70–99)

## 2022-04-03 MED ORDER — CEPHALEXIN 500 MG PO CAPS: 500.0000 mg | ORAL_CAPSULE | Freq: Two times a day (BID) | ORAL | 0 refills | Status: AC

## 2022-04-03 MED ORDER — CLONIDINE HCL 0.1 MG PO TABS
0.1000 mg | ORAL_TABLET | Freq: Every day | ORAL | Status: DC
Start: 2022-04-03 — End: 2022-04-03

## 2022-04-03 MED ORDER — SODIUM CHLORIDE 0.9 % IV SOLN
1.0000 g | INTRAVENOUS | Status: DC
  Filled 2022-04-03: qty 10

## 2022-04-03 MED ORDER — ZOLPIDEM TARTRATE 10 MG PO TABS: 5.0000 mg | ORAL_TABLET | Freq: Every evening | ORAL | 0 refills | Status: DC | PRN

## 2022-04-03 MED ORDER — CLONIDINE HCL 0.1 MG PO TABS: ORAL_TABLET | ORAL | 11 refills | Status: DC

## 2022-04-03 MED ORDER — OXYCODONE HCL 5 MG PO TABS
20.0000 mg | ORAL_TABLET | Freq: Three times a day (TID) | ORAL | Status: DC
  Administered 2022-04-03: 20 mg via ORAL
  Filled 2022-04-03: qty 4

## 2022-04-03 MED ORDER — OXYCODONE HCL 5 MG PO TABS
20.0000 mg | ORAL_TABLET | Freq: Once | ORAL | Status: AC
  Administered 2022-04-03: 20 mg via ORAL
  Filled 2022-04-03: qty 4

## 2022-04-03 MED ORDER — CEPHALEXIN 500 MG PO CAPS
500.0000 mg | ORAL_CAPSULE | Freq: Two times a day (BID) | ORAL | Status: DC
  Administered 2022-04-03: 500 mg via ORAL
  Filled 2022-04-03: qty 1

## 2022-04-03 NOTE — TOC Initial Note (Signed)
Transition of Care (TOC) - Initial/Assessment Note  ? ? ?Patient Details  ?Name: Lindsey [name] ?MRN: [id number] ?Date of Birth: [birth year] ? ?Transition of Care (TOC) CM/SW Contact:    ?Conception Oms, RN ?Phone Number: ?04/03/2022, 10:58 AM ? ?Clinical Narrative:                 ? ? ?Transition of Care (TOC) Screening Note ? ? ?Patient Details  ?Name: Lindsey [name] ?Date of Birth: 1961-06-13 ? ? ?Transition of Care (TOC) CM/SW Contact:    ?Conception Oms, RN ?Phone Number: ?04/03/2022, 10:58 AM ? ? ? ?Transition of Care Department [facility]) has reviewed patient and no TOC needs have been identified at this time. We will continue to monitor patient advancement through interdisciplinary progression rounds. If new patient transition needs arise, please place a TOC consult. ?  ?  ?  ? ? ?Patient Goals and CMS Choice ?  ?  ?  ? ?Expected Discharge Plan and Services ?  ?  ?  ?  ?  ?                ?  ?  ?  ?  ?  ?  ?  ?  ?  ?  ? ?Prior Living Arrangements/Services ?  ?  ?  ?       ?  ?  ?  ?  ? ?Activities of Daily Living ?Home Assistive Devices/Equipment: None ?ADL Screening (condition at time of admission) ?Patient's cognitive ability adequate to safely complete daily activities?: Yes ?Is the patient deaf or have difficulty hearing?: No ?Does the patient have difficulty seeing, even when wearing glasses/contacts?: Yes ?Does the patient have difficulty concentrating, remembering, or making decisions?: No ?Patient able to express need for assistance with ADLs?: No ?Does the patient have difficulty dressing or bathing?: No ?Independently performs ADLs?: Yes (appropriate for developmental age) ?Does the patient have difficulty walking or climbing stairs?: No ?Weakness of Legs: None ?Weakness of Arms/Hands: None ? ?Permission Sought/Granted ?  ?  ?   ?   ?   ?   ? ?Emotional Assessment ?  ?  ?  ?  ?  ?  ? ?Admission diagnosis:  UTI (urinary tract infection) [N39.0] ?Acute cystitis without hematuria [N30.00] ?Generalized  headache [R51.9] ?Acute renal failure superimposed on chronic kidney disease, unspecified CKD stage, unspecified acute renal failure type ([place]) [N17.9, N18.9] ?Patient Active Problem List  ? Diagnosis Date Noted  ? UTI (urinary tract infection) 04/02/2022  ? Pulmonary embolism ([place]) 04/02/2022  ? Headache 04/02/2022  ? HTN (hypertension) 04/02/2022  ? HLD (hyperlipidemia) 04/02/2022  ? Type II diabetes mellitus with renal manifestations ([place]) 04/02/2022  ? Acute renal failure superimposed on stage 3a chronic kidney disease ([place]) 04/02/2022  ? Stroke ([place]) 04/02/2022  ? Depression with anxiety 04/02/2022  ? CAD (coronary artery disease) 04/02/2022  ? Morbid obesity with BMI of 40.0-44.9, adult ([place]) 04/02/2022  ? Acute renal failure superimposed on chronic kidney disease ([place])   ? Generalized headache   ? Acute pulmonary embolism without acute cor pulmonale ([place]) 06/18/2021  ? Elevated LFTs   ? Other chronic pain   ? Acute respiratory failure with hypoxia and hypercapnia ([place]) 06/06/2021  ? Opioid overdose ([place]) 06/06/2021  ? Poorly controlled type 2 diabetes mellitus ([place]) 06/06/2021  ? Transaminitis 06/06/2021  ? Lactic acidosis 06/06/2021  ? Chronic obstructive pulmonary disease, unspecified COPD type ([place]) 06/05/2021  ? Acute lower UTI 05/08/2020  ? Upper GI bleed 05/04/2020  ?  Acute kidney injury superimposed on chronic kidney disease ([place]) 06/09/2017  ? Symptomatic anemia 12/24/2016  ? Chest pain 12/23/2016  ? Dyspnea 12/23/2016  ? Palpitations 12/23/2016  ? Hypotension 09/29/2016  ? Renal insufficiency 09/29/2016  ? Anemia 09/29/2016  ? Leukocytosis 09/29/2016  ? Prediabetes 09/29/2016  ? Jejunal inflammation 09/29/2016  ? Abnormal findings on esophagogastroduodenoscopy (EGD) 09/29/2016  ? Vitamin D deficiency 09/29/2016  ? Syncope 09/27/2016  ? Medication overuse headache 11/08/2015  ? Morbid obesity with BMI of 45.0-49.9, adult ([place]) 11/08/2015  ? Seizures ([place]) 09/02/2015  ? 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  Complications due to nervous system device, implant, and graft 04/18/2013  ? Disease of female genital organs 04/01/2013  ? Difficult or painful urination 03/18/2013  ? Urge incontinence 03/18/2013  ? Urgency of micturation 03/18/2013  ? ?PCP:  Jodi Marble, MD ?Pharmacy:   ?[facility] [place], [facility] ?[address] ?[place] [address] ?Phone: [phone] Fax: [phone] ? ? ? ? ?Social Determinants of Health (SDOH) Interventions ?  ? ?Readmission Risk Interventions ? ?  06/07/2021  ? 10:42 AM  ?Readmission Risk Prevention Plan  ?Transportation Screening Complete  ?PCP or Specialist Appt within 3-5 Days Complete  ?Social Work Consult for [place] Planning/Counseling Complete  ?Palliative Care Screening Not Applicable  ?Medication Review Press photographer) Complete  ? ? ? ?

## 2022-04-03 NOTE — Discharge Summary (Signed)
?Physician Discharge Summary ?  ?Patient: Lindsey [name] MRN: [id number] DOB: 10-30-1961  ?Admit date:     04/02/2022  ?Discharge date: 04/03/22  ?Discharge Physician: Fritzi Mandes  ? ?PCP: Jodi Marble, MD  ? ?Recommendations at discharge:  ? ? Keep log of BP at home and hold you BP meds if systolic bp <010 ?Keep yourself hydrated ? ? ?Discharge Diagnoses: ?acute renal failure superimposed on stage IIIa chronic kidney disease ?UTI ? ?Hospital Course: ?[name] is a 61 y.o. female with medical history significant of hypertension, hyperlipidemia, diabetes mellitus, COPD, stroke, GERD, depression, anxiety, spinal cord stimulator, PUD, CAD, OSA not on CPAP, obesity with BMI 40.72, headache, CKD-3A, PE (just finished course of Eliquis treatment this week), who presents with generalized weakness, chills, poor appetite, headache. ? ?Patient states that she has history of PE, but completed course of Eliquis this week.  Her Eliquis was discontinued by her PCP ? ?acute renal failure superimposed on stage IIIa chronic disease ?-- patient received IV fluids. Came in with creatinine of 2.08-- 1.6 ?-- likely due to dehydration in men ?-- patient advised oral hydration. She feels better after IV fluids ?-- BP meds were held however discussed with patient regarding resumes from once systolic blood pressure is about 120. ?-- Eating well. ? ?Headache ?-- resolved ? ? ?Morbid obesity with BMI of 40.0-44.9, adult ([place]) ?BMI= 4.72  and BW=117.9 ?-Diet and exercise.   ?-Encouraged to lose weight. ?  ?CAD (coronary artery disease) ?No chest pain ?-Lipitor ?  ?Depression with anxiety ?- Continue home medications ?  ?Stroke [facility]) ?- Continue Lipitor ?  ?Type II diabetes mellitus with renal manifestations ([place]) ?Recent A1c 7.3, poorly controlled.  Patient is taking [place] 26 units daily ?-resume home meds. Recommend carb control diet ?-Sliding scale insulin ?  ?HLD (hyperlipidemia) ?- Lipitor ?  ?HTN (hypertension) ?- IV  hydralazine as needed ?-Clonidine, metoprolol ?-Hold olmesartan due to worsening renal function ?-- patient's blood pressure remains soft.MAP was more than 70. She ambulated with the nurse. Did not have any dizzy spell. Discussed with her to hold her BP meds the day she feels dizzy and if systolic is less than 932 ?-- defer to PCP to adjust BP meds as outpatient ?  ?Pulmonary embolism ([place]) ?Patient states that she finished of Eliquis this week.  -No acute issues. ?  ?Chronic obstructive pulmonary disease, unspecified COPD type ([place]) ?Stable ?-Bronchodilators ?  ?overall improving. Discussed discharge planning with patient agreeable. ?  ? ? ?  ? ? ?Consultants: none ?Disposition: Home ?Diet recommendation:  ?Discharge Diet Orders (From admission, onward)  ? ?  Start     Ordered  ? 04/03/22 0000  Diet - low sodium heart healthy       ? 04/03/22 1242  ? ?  ?  ? ?  ? ?Cardiac and Carb modified diet ?DISCHARGE MEDICATION: ?Allergies as of 04/03/2022   ? ?   Reactions  ? Morphine Hives  ? Iodinated Contrast Media Hives  ? Aspirin Hives  ? Noted on MD progress notes and discussed with MD 09/02/15  ? Etodolac Hives  ? Ibuprofen Hives  ? Shellfish Allergy Hives  ? Tylenol [acetaminophen] Hives  ? Upset stomach   ? ?  ? ?  ?Medication List  ?  ? ?STOP taking these medications   ? ?apixaban 5 MG Tabs tablet ?Commonly known as: ELIQUIS ?  ? ?  ? ?TAKE these medications   ? ?albuterol 108 (90 Base) MCG/ACT inhaler ?Commonly known  asEnid Cutter HFA ?Inhale 2 puffs into the lungs every 4 (four) hours as needed for wheezing or shortness of breath. ?  ?ALPRAZolam 0.25 MG tablet ?Commonly known as: Duanne Moron ?Take 0.25 mg by mouth 3 (three) times daily. ?  ?atorvastatin 40 MG tablet ?Commonly known as: LIPITOR ?Take 40 mg by mouth daily. ?  ?Belsomra 15 MG Tabs ?Generic drug: Suvorexant ?Take 1 tablet by mouth at bedtime. ?  ?busPIRone 7.5 MG tablet ?Commonly known as: BUSPAR ?Take 7.5 mg by mouth 2 (two) times daily. ?  ?cephALEXin 500 MG  capsule ?Commonly known as: KEFLEX ?Take 1 capsule (500 mg total) by mouth every 12 (twelve) hours for 5 days. ?  ?cloNIDine 0.1 MG tablet ?Commonly known as: CATAPRES ?Take 0.1 mg qhs ?What changed:  ?how much to take ?how to take this ?when to take this ?additional instructions ?  ?diclofenac Sodium 1 % Gel ?Commonly known as: VOLTAREN ?Apply 4 g topically See admin instructions. Apply 4 grams twice a week to neck. ?  ?esomeprazole 40 MG capsule ?Commonly known as: [place] ?Take 40 mg by mouth 2 (two) times daily. ?  ?ferrous sulfate 325 (65 FE) MG tablet ?Take 1 tablet (325 mg total) by mouth 2 (two) times daily with a meal. ?  ?fexofenadine 180 MG tablet ?Commonly known as: ALLEGRA ?Take 180 mg by mouth daily. ?  ?fluticasone 50 MCG/ACT nasal spray ?Commonly known as: FLONASE ?Place 1 spray into both nostrils daily. ?  ?folic acid 1 MG tablet ?Commonly known as: FOLVITE ?Take 1 mg by mouth daily. ?  ?hydrOXYzine 25 MG tablet ?Commonly known as: ATARAX ?Take 25 mg by mouth 3 (three) times daily. ?  ?meclizine 25 MG tablet ?Commonly known as: ANTIVERT ?Take 1 tablet (25 mg total) by mouth 3 (three) times daily as needed for dizziness or nausea. ?  ?metoprolol succinate 100 MG 24 hr tablet ?Commonly known as: TOPROL-XL ?Take 100 mg by mouth daily. ?  ?mirtazapine 45 MG tablet ?Commonly known as: REMERON ?Take 1 tablet (45 mg total) by mouth at bedtime for 7 days. ?  ?naloxone 4 MG/0.1ML Liqd nasal spray kit ?Commonly known as: NARCAN ?One intranasal for altered mental status ?  ?OLMESARTAN MEDOXOMIL PO ?Take by mouth. ?  ?oxybutynin 5 MG tablet ?Commonly known as: DITROPAN ?Take 5 mg by mouth 2 (two) times daily. ?  ?OYSTER SHELL CALCIUM 500 + D PO ?Take 1 tablet by mouth daily. ?  ?polyethylene glycol 17 g packet ?Commonly known as: MIRALAX / GLYCOLAX ?Take 17 g by mouth daily as needed for mild constipation. ?  ?sucralfate 1 GM/10ML suspension ?Commonly known as: CARAFATE ?Take 10 mLs (1 g total) by mouth 4 (four)  times daily -  with meals and at bedtime for 14 days. ?  ?Tradjenta 5 MG Tabs tablet ?Generic drug: linagliptin ?Take 5 mg by mouth daily. ?  ?Tyler Aas FlexTouch 200 UNIT/ML FlexTouch Pen ?Generic drug: insulin degludec ?Inject 26 Units into the skin every morning. ?  ?Ubrelvy 100 MG Tabs ?Generic drug: Ubrogepant ?Take 100 mg by mouth as needed. ?  ?zolpidem 10 MG tablet ?Commonly known as: AMBIEN ?Take 0.5 tablets (5 mg total) by mouth at bedtime as needed for sleep. ?  ? ?  ? ? Follow-up Information   ? ? Jodi Marble, MD. Schedule an appointment as soon as possible for a visit.   ?Specialty: Internal Medicine ?Why: pt to call and make appt for hospital ?Contact information: ?[address] ?[place] [address] ?  [phone] ? ? ?  ?  ? ?  ?  ? ?  ? ?Discharge Exam: ?Danley Danker Weights  ? 04/02/22 0448  ?Weight: 117.9 kg  ? ? ? ?Condition at discharge: fair ? ?The results of significant diagnostics from this hospitalization (including imaging, microbiology, ancillary and laboratory) are listed below for reference.  ? ?Imaging Studies: ?CT Head Wo Contrast ? ?Result Date: 04/02/2022 ?CLINICAL DATA:  Sudden severe headache. EXAM: CT HEAD WITHOUT CONTRAST TECHNIQUE: Contiguous axial images were obtained from the base of the skull through the vertex without intravenous contrast. RADIATION DOSE REDUCTION: This exam was performed according to the departmental dose-optimization program which includes automated exposure control, adjustment of the mA and/or kV according to patient size and/or use of iterative reconstruction technique. COMPARISON:  Head CT 06/05/2021 FINDINGS: Brain: No evidence of acute infarction, hemorrhage, hydrocephalus, extra-axial collection or mass lesion/mass effect. There are benign dural calcifications along the falx and right lateral tentorium. Vascular: There are scattered calcifications of the carotid siphons but no hyperdense central vasculature. Skull: No evidence of fractures or focal  lesion. Sinuses/Orbits: No acute finding. Other: None. IMPRESSION: No acute intracranial CT findings or interval changes. Electronically Signed   By: Telford Nab M.D.   On: 04/02/2022 05:32   ? ?Microbiology

## 2022-04-03 NOTE — Plan of Care (Signed)

## 2022-04-03 NOTE — Progress Notes (Signed)
Patient ambulated around nurses station 1 time, no dizziness, only complaint is weakness from being tired and no sleep. ?

## 2022-04-03 NOTE — Plan of Care (Signed)

## 2022-04-03 NOTE — Progress Notes (Unsigned)
{  Select_TRH_Note:[id number]} 

## 2022-04-03 NOTE — Discharge Instructions (Addendum)
Keep log of BP at home and hold you BP meds if systolic bp <929 ?Keep yourself hydrated ?

## 2022-04-04 ENCOUNTER — Other Ambulatory Visit: Payer: Self-pay | Admitting: Internal Medicine

## 2022-04-04 LAB — URINE CULTURE: Culture: >=100000 — AB

## 2022-04-04 MED ORDER — ZOLPIDEM TARTRATE 10 MG PO TABS
5.0000 mg | ORAL_TABLET | Freq: Every evening | ORAL | 0 refills | Status: DC | PRN
Start: 2022-04-04 — End: 2023-06-07

## 2022-04-04 NOTE — Progress Notes (Unsigned)
{  Select_TRH_Note:[id number]} 

## 2022-04-07 LAB — CULTURE, BLOOD (SINGLE): Culture: NO GROWTH

## 2022-04-14 DIAGNOSIS — G8929 Other chronic pain: Secondary | ICD-10-CM | POA: Diagnosis not present

## 2022-04-14 DIAGNOSIS — I639 Cerebral infarction, unspecified: Secondary | ICD-10-CM | POA: Diagnosis not present

## 2022-04-14 DIAGNOSIS — S92504D Nondisplaced unspecified fracture of right lesser toe(s), subsequent encounter for fracture with routine healing: Secondary | ICD-10-CM | POA: Diagnosis not present

## 2022-04-14 DIAGNOSIS — E119 Type 2 diabetes mellitus without complications: Secondary | ICD-10-CM | POA: Diagnosis not present

## 2022-04-14 DIAGNOSIS — G43009 Migraine without aura, not intractable, without status migrainosus: Secondary | ICD-10-CM | POA: Diagnosis not present

## 2022-04-14 DIAGNOSIS — G4700 Insomnia, unspecified: Secondary | ICD-10-CM | POA: Diagnosis not present

## 2022-05-04 DIAGNOSIS — R0902 Hypoxemia: Secondary | ICD-10-CM | POA: Diagnosis not present

## 2022-05-15 DIAGNOSIS — Z23 Encounter for immunization: Secondary | ICD-10-CM | POA: Diagnosis not present

## 2022-05-15 DIAGNOSIS — J45998 Other asthma: Secondary | ICD-10-CM | POA: Diagnosis not present

## 2022-05-15 DIAGNOSIS — E119 Type 2 diabetes mellitus without complications: Secondary | ICD-10-CM | POA: Diagnosis not present

## 2022-05-15 DIAGNOSIS — G43009 Migraine without aura, not intractable, without status migrainosus: Secondary | ICD-10-CM | POA: Diagnosis not present

## 2022-05-15 DIAGNOSIS — G4700 Insomnia, unspecified: Secondary | ICD-10-CM | POA: Diagnosis not present

## 2022-05-15 DIAGNOSIS — I639 Cerebral infarction, unspecified: Secondary | ICD-10-CM | POA: Diagnosis not present

## 2022-05-15 DIAGNOSIS — E7800 Pure hypercholesterolemia, unspecified: Secondary | ICD-10-CM | POA: Diagnosis not present

## 2022-06-03 DIAGNOSIS — R0902 Hypoxemia: Secondary | ICD-10-CM | POA: Diagnosis not present

## 2022-06-14 DIAGNOSIS — I639 Cerebral infarction, unspecified: Secondary | ICD-10-CM | POA: Diagnosis not present

## 2022-06-14 DIAGNOSIS — G8929 Other chronic pain: Secondary | ICD-10-CM | POA: Diagnosis not present

## 2022-06-14 DIAGNOSIS — E119 Type 2 diabetes mellitus without complications: Secondary | ICD-10-CM | POA: Diagnosis not present

## 2022-06-14 DIAGNOSIS — G43009 Migraine without aura, not intractable, without status migrainosus: Secondary | ICD-10-CM | POA: Diagnosis not present

## 2022-07-04 DIAGNOSIS — R0902 Hypoxemia: Secondary | ICD-10-CM | POA: Diagnosis not present

## 2022-07-11 DIAGNOSIS — E119 Type 2 diabetes mellitus without complications: Secondary | ICD-10-CM | POA: Diagnosis not present

## 2022-07-15 ENCOUNTER — Other Ambulatory Visit: Payer: Self-pay

## 2022-07-15 ENCOUNTER — Emergency Department
Admission: EM | Admit: 2022-07-15 | Discharge: 2022-07-16 | Disposition: A | Discharge status: Home / Self Care | Payer: Medicare Other | Attending: Emergency Medicine | Admitting: Emergency Medicine

## 2022-07-15 ENCOUNTER — Emergency Department: Payer: Medicare Other

## 2022-07-15 DIAGNOSIS — N179 Acute kidney failure, unspecified: Secondary | ICD-10-CM

## 2022-07-15 DIAGNOSIS — Z743 Need for continuous supervision: Secondary | ICD-10-CM | POA: Diagnosis not present

## 2022-07-15 DIAGNOSIS — R0789 Other chest pain: Secondary | ICD-10-CM | POA: Diagnosis not present

## 2022-07-15 DIAGNOSIS — R6889 Other general symptoms and signs: Secondary | ICD-10-CM | POA: Diagnosis not present

## 2022-07-15 DIAGNOSIS — G253 Myoclonus: Secondary | ICD-10-CM | POA: Diagnosis not present

## 2022-07-15 DIAGNOSIS — E1122 Type 2 diabetes mellitus with diabetic chronic kidney disease: Secondary | ICD-10-CM | POA: Insufficient documentation

## 2022-07-15 DIAGNOSIS — J449 Chronic obstructive pulmonary disease, unspecified: Secondary | ICD-10-CM | POA: Diagnosis not present

## 2022-07-15 DIAGNOSIS — I2510 Atherosclerotic heart disease of native coronary artery without angina pectoris: Secondary | ICD-10-CM | POA: Insufficient documentation

## 2022-07-15 DIAGNOSIS — R079 Chest pain, unspecified: Secondary | ICD-10-CM | POA: Diagnosis not present

## 2022-07-15 DIAGNOSIS — J9811 Atelectasis: Secondary | ICD-10-CM | POA: Diagnosis not present

## 2022-07-15 DIAGNOSIS — I129 Hypertensive chronic kidney disease with stage 1 through stage 4 chronic kidney disease, or unspecified chronic kidney disease: Secondary | ICD-10-CM | POA: Diagnosis not present

## 2022-07-15 DIAGNOSIS — R059 Cough, unspecified: Secondary | ICD-10-CM | POA: Diagnosis not present

## 2022-07-15 DIAGNOSIS — N1831 Chronic kidney disease, stage 3a: Secondary | ICD-10-CM | POA: Diagnosis not present

## 2022-07-15 DIAGNOSIS — G9389 Other specified disorders of brain: Secondary | ICD-10-CM | POA: Diagnosis not present

## 2022-07-15 DIAGNOSIS — R29818 Other symptoms and signs involving the nervous system: Secondary | ICD-10-CM | POA: Diagnosis not present

## 2022-07-15 LAB — CBC WITH DIFFERENTIAL/PLATELET
Abs Immature Granulocytes: 0.02 10*3/uL (ref 0.00–0.07)
Basophils Absolute: 0.1 10*3/uL (ref 0.0–0.1)
Basophils Relative: 1 %
Eosinophils Absolute: 0.1 10*3/uL (ref 0.0–0.5)
Eosinophils Relative: 1 %
HCT: 44.3 % (ref 36.0–46.0)
Hemoglobin: 13.8 g/dL (ref 12.0–15.0)
Immature Granulocytes: 0 %
Lymphocytes Relative: 38 %
Lymphs Abs: 3.7 10*3/uL (ref 0.7–4.0)
MCH: 27.2 pg (ref 26.0–34.0)
MCHC: 31.2 g/dL (ref 30.0–36.0)
MCV: 87.4 fL (ref 80.0–100.0)
Monocytes Absolute: 0.7 10*3/uL (ref 0.1–1.0)
Monocytes Relative: 7 %
Neutro Abs: 5.1 10*3/uL (ref 1.7–7.7)
Neutrophils Relative %: 53 %
Platelets: 263 10*3/uL (ref 150–400)
RBC: 5.07 MIL/uL (ref 3.87–5.11)
RDW: 14.2 % (ref 11.5–15.5)
WBC: 9.7 10*3/uL (ref 4.0–10.5)
nRBC: 0 % (ref 0.0–0.2)

## 2022-07-15 LAB — COMPREHENSIVE METABOLIC PANEL
ALT: 28 U/L (ref 0–44)
AST: 24 U/L (ref 15–41)
Albumin: 3.6 g/dL (ref 3.5–5.0)
Alkaline Phosphatase: 105 U/L (ref 38–126)
Anion gap: 6 (ref 5–15)
BUN: 54 mg/dL — ABNORMAL HIGH (ref 8–23)
CO2: 22 mmol/L (ref 22–32)
Calcium: 8.7 mg/dL — ABNORMAL LOW (ref 8.9–10.3)
Chloride: 112 mmol/L — ABNORMAL HIGH (ref 98–111)
Creatinine, Ser: 2.55 mg/dL — ABNORMAL HIGH (ref 0.44–1.00)
GFR, Estimated: 21 mL/min — ABNORMAL LOW (ref >60)
Glucose, Bld: 154 mg/dL — ABNORMAL HIGH (ref 70–99)
Potassium: 4.3 mmol/L (ref 3.5–5.1)
Sodium: 140 mmol/L (ref 135–145)
Total Bilirubin: 0.8 mg/dL (ref 0.3–1.2)
Total Protein: 7.2 g/dL (ref 6.5–8.1)

## 2022-07-15 LAB — TROPONIN I (HIGH SENSITIVITY)
Troponin I (High Sensitivity): 4 ng/L (ref <18)
Troponin I (High Sensitivity): 2 ng/L (ref <18)

## 2022-07-15 LAB — MAGNESIUM: Magnesium: 2.6 mg/dL — ABNORMAL HIGH (ref 1.7–2.4)

## 2022-07-15 MED ORDER — LACTATED RINGERS IV BOLUS
1000.0000 mL | Freq: Once | INTRAVENOUS | Status: AC
  Administered 2022-07-15: 1000 mL via INTRAVENOUS

## 2022-07-15 MED ORDER — NYSTATIN 100000 UNIT/GM EX POWD
Freq: Once | CUTANEOUS | Status: AC
  Filled 2022-07-15: qty 15

## 2022-07-15 NOTE — ED Notes (Signed)
Repeat EKG completed and shown to Dr. Starleen Blue per request.

## 2022-07-15 NOTE — ED Notes (Signed)
Md at bedside, will complete Si screening and obtain temperature after md finished.

## 2022-07-15 NOTE — ED Triage Notes (Signed)
Pt with chest pain that began this am around 11 while at rest. Pt states she has also been "shaking" all over her body since yesterday.

## 2022-07-15 NOTE — ED Provider Notes (Addendum)
[facility] Provider Note    Event Date/Time   First MD Initiated Contact with Patient 07/15/22 2019     (approximate)   History   Chest Pain   HPI  Lindsey [name] is a 61 y.o. female   with past medical history of hypertension hyperlipidemia diabetes COPD obesity stroke CKD presents with chest pain.  Chest pain started around 11 AM today.  She endorses a sharp pain in the left side of her chest that does not radiate.  It is intermittently worse but has been rather constant.  Not associated with nausea vomiting diaphoresis patient has not really been up much since it started so cannot tell if it is exertional.  Took a nap and when she woke up pain seemed to feel worse she does have some mild associated dyspnea.  Denies cough fevers chills recent illnesses.  She also endorses twitching that is been going on for the last 2 to 3 days primarily in the upper extremities denies any new headache focal numbness tingling weakness.  Does have history of CVA with some mild residual left-sided deficits ambulates without assistance.  Patient thinks she is dehydrated says she had similar twitching when she was hospitalized for AKI and dehydration 3 months ago.     Past Medical History:  Diagnosis Date   Allergy    Anemia    Arthritis    knees   COPD (chronic obstructive pulmonary disease) ([place])    Diabetes mellitus without complication (HCC)    diet controlled   GERD (gastroesophageal reflux disease)    Heart attack ([place]) 2002   Hyperlipidemia    Hypertension    PUD (peptic ulcer disease)    Sleep apnea    can't tolerate CPAP.  Uses O2 only   Spinal cord stimulator status    for lower back pain   Stroke [facility]) 2004   "mini-stroke"   Vertigo    Wears dentures    full upper and lower    Patient Active Problem List   Diagnosis Date Noted   UTI (urinary tract infection) 04/02/2022   Pulmonary embolism ([place]) 04/02/2022   Headache 04/02/2022   HTN  (hypertension) 04/02/2022   HLD (hyperlipidemia) 04/02/2022   Type II diabetes mellitus with renal manifestations ([place]) 04/02/2022   Acute renal failure superimposed on stage 3a chronic kidney disease ([place]) 04/02/2022   Stroke ([place]) 04/02/2022   Depression with anxiety 04/02/2022   CAD (coronary artery disease) 04/02/2022   Morbid obesity with BMI of 40.0-44.9, adult ([place]) 04/02/2022   Acute renal failure superimposed on chronic kidney disease ([place])    Generalized headache    Acute pulmonary embolism without acute cor pulmonale ([place]) 06/18/2021   Elevated LFTs    Other chronic pain    Acute respiratory failure with hypoxia and hypercapnia (HCC) 06/06/2021   Opioid overdose ([place]) 06/06/2021   Poorly controlled type 2 diabetes mellitus ([place]) 06/06/2021   Transaminitis 06/06/2021   Lactic acidosis 06/06/2021   Chronic obstructive pulmonary disease, unspecified COPD type ([place]) 06/05/2021   Acute lower UTI 05/08/2020   Upper GI bleed 05/04/2020   Acute kidney injury superimposed on chronic kidney disease ([place]) 06/09/2017   Symptomatic anemia 12/24/2016   Chest pain 12/23/2016   Dyspnea 12/23/2016   Palpitations 12/23/2016   Hypotension 09/29/2016   Renal insufficiency 09/29/2016   Anemia 09/29/2016   Leukocytosis 09/29/2016   Prediabetes 09/29/2016   Jejunal inflammation 09/29/2016   Abnormal findings on esophagogastroduodenoscopy (EGD) 09/29/2016   Vitamin  D deficiency 09/29/2016   Syncope 09/27/2016   Medication overuse headache 11/08/2015   Morbid obesity with BMI of 45.0-49.9, adult ([place]) 11/08/2015   Seizures ([place]) 09/02/2015   Lipodystrophy 95/62/1308   Complications due to nervous system device, implant, and graft 04/18/2013   Disease of female genital organs 04/01/2013   Difficult or painful urination 03/18/2013   Urge incontinence 03/18/2013   Urgency of micturation 03/18/2013     Physical Exam  Triage Vital Signs: ED Triage Vitals  Enc Vitals Group     BP       Pulse      Resp      Temp      Temp src      SpO2      Weight      Height      Head Circumference      Peak Flow      Pain Score      Pain Loc      Pain Edu?      Excl. in [place]?     Most recent vital signs: Vitals:   07/15/22 2130 07/15/22 2135  BP:  103/71  Pulse: 86 81  Resp: (!) 23 16  Temp:    SpO2: 99% 99%     General: Awake, no distress.  CV:  Good peripheral perfusion. No edema Resp:  Normal effort. No increased wib Abd:  No distention. Nontender throughout Neuro:             Awake, Alert, Oriented x 3  Other:  Aox3, nml speech  PERRL, EOMI, face symmetric, nml tongue movement  5/5 strength in the BL upper and lower extremities  Sensation grossly intact in the BL upper and lower extremities  Finger-nose-finger intact BL Intermittent myoclonic-like jerking primarily in the RUE    ED Results / Procedures / Treatments  Labs (all labs ordered are listed, but only abnormal results are displayed) Labs Reviewed  COMPREHENSIVE METABOLIC PANEL - Abnormal; Notable for the following components:      Result Value   Chloride 112 (*)    Glucose, Bld 154 (*)    BUN 54 (*)    Creatinine, Ser 2.55 (*)    Calcium 8.7 (*)    GFR, Estimated 21 (*)    All other components within normal limits  MAGNESIUM - Abnormal; Notable for the following components:   Magnesium 2.6 (*)    All other components within normal limits  CBC WITH DIFFERENTIAL/PLATELET  TROPONIN I (HIGH SENSITIVITY)     EKG  Significant baseline artifact,   RADIOLOGY I reviewed and interpreted the CXR which does not show any acute cardiopulmonary process  I reviewed and interpreted the CT scan of the brain which does not show any acute intracranial process    PROCEDURES:  Critical Care performed: No  .1-3 Lead EKG Interpretation  Performed by: Rada Hay, MD Authorized by: Rada Hay, MD     Interpretation: normal     ECG rate assessment: normal     Rhythm: sinus rhythm      Ectopy: none     Conduction: normal     The patient is on the cardiac monitor to evaluate for evidence of arrhythmia and/or significant heart rate changes.   MEDICATIONS ORDERED IN ED: Medications  lactated ringers bolus 1,000 mL (1,000 mLs Intravenous New Bag/Given 07/15/22 2201)     IMPRESSION / MDM / ASSESSMENT AND PLAN / ED COURSE  I reviewed the triage vital signs and  the nursing notes.                              Patient's presentation is most consistent with acute presentation with potential threat to life or bodily function.  Differential diagnosis includes, but is not limited to, acute coronary syndrome, pulmonary embolism, pleurisy, GERD, esophageal spasm  Patient is a 61 year old female with multiple comorbidities presents primarily with chest pain and upper extremity twitching.  Chest pain has been going on for the past 9 hours it is sharp intermittent without clear exacerbating or alleviating factors.  She also has some what appears to be myoclonic jerking primarily in the right upper extremity says she has had this in the past with dehydration.  She feels like she is dehydrated currently unclear reasons that she still tolerating p.o.  Mucous membranes are somewhat dry.  Neurologic exam nonfocal other than the twitching.  Plan to check labs and CT head EKG  CT head is negative for acute abnormality chest x-ray showed cardiomegaly that is stable but no other acute process.  EKG does have T wave inversion V1 and V2 with large Q-wave in lead III S wave in lead I which were present before.  Patient's labs do show mild AKI creatinine 2.5 her creatinine has ranged around 1.6-2.  We will give a liter of fluid EF is 55 to 60% last checked.  On reassessment patient is not having any pain in her chest she is satting well.  Consider the diagnosis of PE given her history.  Patient contrast allergy and with AKI would not want to get a CTA anyway.  Given she is feeling improved satting  well with no new EKG changes negative troponin my suspicion for PE overall is low and I think we can avoid further work-up at this time.  Did discuss with patient that if symptoms are returning that she needs to come to the ER back to the ED.  We will repeat the troponin.   Repeat troponin is negative.  Have discussed the AKI with the patient recommended avoiding NSAIDs and holding her olmesartan until she can follow-up with the primary doctor.  Discussed return precautions.  FINAL CLINICAL IMPRESSION(S) / ED DIAGNOSES   Final diagnoses:  Myoclonic jerking  Chest pain, unspecified type     Rx / DC Orders   ED Discharge Orders     None        Note:  This document was prepared using Dragon voice recognition software and may include unintentional dictation errors.   Rada Hay, MD 07/15/22 2202    Rada Hay, MD 07/15/22 2351

## 2022-07-15 NOTE — Discharge Instructions (Addendum)
Your EKG and blood work for your heart was reassuring.  Your kidney function was slightly worse than it has been before.  Please make sure you are taking in enough hydration.  Please follow-up with your primary doctor in about 1 week to have repeat blood work done to ensure this is improving.  Please avoid all NSAIDs such as ibuprofen or Motrin.  Please stop taking your olmesartan until you can follow-up for repeat blood draw, this can sometimes contribute to kidney injury.  Please follow-up with neurology in regards to the jerking movements you are having.  If your chest pain returns or is changing or not improving please return to emergency department.

## 2022-07-17 ENCOUNTER — Emergency Department
Admission: EM | Admit: 2022-07-17 | Discharge: 2022-07-17 | Disposition: A | Discharge status: Home / Self Care | Payer: Medicare Other | Attending: Emergency Medicine | Admitting: Emergency Medicine

## 2022-07-17 ENCOUNTER — Other Ambulatory Visit: Payer: Self-pay

## 2022-07-17 ENCOUNTER — Encounter: Payer: Self-pay | Admitting: Intensive Care

## 2022-07-17 ENCOUNTER — Emergency Department: Payer: Medicare Other

## 2022-07-17 DIAGNOSIS — K5900 Constipation, unspecified: Secondary | ICD-10-CM | POA: Diagnosis not present

## 2022-07-17 DIAGNOSIS — E119 Type 2 diabetes mellitus without complications: Secondary | ICD-10-CM | POA: Diagnosis not present

## 2022-07-17 DIAGNOSIS — K625 Hemorrhage of anus and rectum: Secondary | ICD-10-CM | POA: Diagnosis not present

## 2022-07-17 DIAGNOSIS — I2699 Other pulmonary embolism without acute cor pulmonale: Secondary | ICD-10-CM | POA: Diagnosis not present

## 2022-07-17 DIAGNOSIS — R109 Unspecified abdominal pain: Secondary | ICD-10-CM | POA: Diagnosis not present

## 2022-07-17 DIAGNOSIS — G8929 Other chronic pain: Secondary | ICD-10-CM | POA: Diagnosis not present

## 2022-07-17 DIAGNOSIS — N61 Mastitis without abscess: Secondary | ICD-10-CM | POA: Diagnosis not present

## 2022-07-17 DIAGNOSIS — K922 Gastrointestinal hemorrhage, unspecified: Secondary | ICD-10-CM | POA: Diagnosis not present

## 2022-07-17 DIAGNOSIS — G43009 Migraine without aura, not intractable, without status migrainosus: Secondary | ICD-10-CM | POA: Diagnosis not present

## 2022-07-17 DIAGNOSIS — I639 Cerebral infarction, unspecified: Secondary | ICD-10-CM | POA: Diagnosis not present

## 2022-07-17 DIAGNOSIS — I10 Essential (primary) hypertension: Secondary | ICD-10-CM | POA: Diagnosis not present

## 2022-07-17 DIAGNOSIS — G4700 Insomnia, unspecified: Secondary | ICD-10-CM | POA: Diagnosis not present

## 2022-07-17 DIAGNOSIS — R1084 Generalized abdominal pain: Secondary | ICD-10-CM | POA: Diagnosis not present

## 2022-07-17 HISTORY — DX: Heart failure, unspecified: I50.9

## 2022-07-17 LAB — CBC WITH DIFFERENTIAL/PLATELET
Abs Immature Granulocytes: 0.03 10*3/uL (ref 0.00–0.07)
Basophils Absolute: 0 10*3/uL (ref 0.0–0.1)
Basophils Relative: 1 %
Eosinophils Absolute: 0.1 10*3/uL (ref 0.0–0.5)
Eosinophils Relative: 1 %
HCT: 45.9 % (ref 36.0–46.0)
Hemoglobin: 14.3 g/dL (ref 12.0–15.0)
Immature Granulocytes: 0 %
Lymphocytes Relative: 28 %
Lymphs Abs: 2.5 10*3/uL (ref 0.7–4.0)
MCH: 27.3 pg (ref 26.0–34.0)
MCHC: 31.2 g/dL (ref 30.0–36.0)
MCV: 87.8 fL (ref 80.0–100.0)
Monocytes Absolute: 0.5 10*3/uL (ref 0.1–1.0)
Monocytes Relative: 5 %
Neutro Abs: 5.8 10*3/uL (ref 1.7–7.7)
Neutrophils Relative %: 65 %
Platelets: 288 10*3/uL (ref 150–400)
RBC: 5.23 MIL/uL — ABNORMAL HIGH (ref 3.87–5.11)
RDW: 13.9 % (ref 11.5–15.5)
WBC: 8.8 10*3/uL (ref 4.0–10.5)
nRBC: 0 % (ref 0.0–0.2)

## 2022-07-17 LAB — COMPREHENSIVE METABOLIC PANEL
ALT: 26 U/L (ref 0–44)
AST: 24 U/L (ref 15–41)
Albumin: 3.7 g/dL (ref 3.5–5.0)
Alkaline Phosphatase: 106 U/L (ref 38–126)
Anion gap: 6 (ref 5–15)
BUN: 43 mg/dL — ABNORMAL HIGH (ref 8–23)
CO2: 22 mmol/L (ref 22–32)
Calcium: 9.1 mg/dL (ref 8.9–10.3)
Chloride: 109 mmol/L (ref 98–111)
Creatinine, Ser: 1.68 mg/dL — ABNORMAL HIGH (ref 0.44–1.00)
GFR, Estimated: 34 mL/min — ABNORMAL LOW (ref >60)
Glucose, Bld: 151 mg/dL — ABNORMAL HIGH (ref 70–99)
Potassium: 4.5 mmol/L (ref 3.5–5.1)
Sodium: 137 mmol/L (ref 135–145)
Total Bilirubin: 1.1 mg/dL (ref 0.3–1.2)
Total Protein: 7.3 g/dL (ref 6.5–8.1)

## 2022-07-17 LAB — TYPE AND SCREEN
ABO/RH(D): A POS
Antibody Screen: NEGATIVE

## 2022-07-17 LAB — LIPASE, BLOOD: Lipase: 28 U/L (ref 11–51)

## 2022-07-17 NOTE — ED Triage Notes (Signed)
Patient c/o rectal bleeding, that is bright red in color, starting this AM. Reports hx gastroparesis in 2007. C/o severe abdominal pain. Seen 07/15/22 and treated for dehydration per patient and sent home.  Patient wears 2L O2 continuous

## 2022-07-17 NOTE — Discharge Instructions (Addendum)
Please use MiraLAX one half capful every hour until your first bowel movement.  Please do not take any MiraLAX after this for at least 24 hours.  You may use one half capful twice a day of MiraLAX in order to have 1 solid well-formed bowel movement per day.  You may increase or decrease this dosage as needed to obtain this 1 well-formed bowel movement.  Please make sure that you are drinking at least 8 ounces of water every hour during this initial bowel regimen. ?

## 2022-07-17 NOTE — ED Provider Notes (Signed)
[facility] Provider Note   Event Date/Time   First MD Initiated Contact with Patient 07/17/22 2016     (approximate) History  Rectal Bleeding  HPI Lindsey [name] is a 61 y.o. female with a past medical history of gastric bypass surgery who presents for 1 day of bright red blood per rectum as well and has midepigastric abdominal pain.  Patient is concerned as she states that when she has had similar pains in the past she had a "hole in my stomach that needed to be fixed".  Patient denies any recent travel, sick contacts, or food out of the ordinary.  Patient does endorse constipation over the last 2 days ROS: Patient currently denies any vision changes, tinnitus, difficulty speaking, facial droop, sore throat, chest pain, shortness of breath, abdominal pain, nausea/vomiting/diarrhea, dysuria, or weakness/numbness/paresthesias in any extremity   Physical Exam  Triage Vital Signs: ED Triage Vitals [07/17/22 1557]  Enc Vitals Group     BP 107/86     Pulse Rate 94     Resp 20     Temp 98.2 F (36.8 C)     Temp Source Oral     SpO2 100 %     Weight 257 lb (116.6 kg)     Height '5\' 7"'$  (1.702 m)     Head Circumference      Peak Flow      Pain Score 9     Pain Loc      Pain Edu?      Excl. in [place]?    Most recent vital signs: Vitals:   07/17/22 2030 07/17/22 2100  BP: 116/75 107/77  Pulse: 69 71  Resp: 17 17  Temp:    SpO2: 99% 97%   General: Awake, oriented x4. CV:  Good peripheral perfusion.  Resp:  Normal effort.  Abd:  No distention.  Patient denied rectal exam Other:  Obese elderly African-American female laying in bed in no acute distress ED Results / Procedures / Treatments  Labs (all labs ordered are listed, but only abnormal results are displayed) Labs Reviewed  COMPREHENSIVE METABOLIC PANEL - Abnormal; Notable for the following components:      Result Value   Glucose, Bld 151 (*)    BUN 43 (*)    Creatinine, Ser 1.68 (*)    GFR,  Estimated 34 (*)    All other components within normal limits  CBC WITH DIFFERENTIAL/PLATELET - Abnormal; Notable for the following components:   RBC 5.23 (*)    All other components within normal limits  LIPASE, BLOOD  URINALYSIS, ROUTINE W REFLEX MICROSCOPIC  TYPE AND SCREEN   RADIOLOGY ED MD interpretation: CT of the abdomen and pelvis without IV contrast shows bilateral lipid rich adrenal adenomas with evidence of prior bypass surgery and no acute intra-abdominal or intrapelvic abnormalities.  This study was interpreted by me -Agree with radiology assessment Official radiology report(s): CT ABDOMEN PELVIS WO CONTRAST  Result Date: 07/17/2022 CLINICAL DATA:  Acute abdominal pain, diffuse abdominal pain and rectal bleeding EXAM: CT ABDOMEN AND PELVIS WITHOUT CONTRAST TECHNIQUE: Multidetector CT imaging of the abdomen and pelvis was performed following the standard protocol without IV contrast. RADIATION DOSE REDUCTION: This exam was performed according to the departmental dose-optimization program which includes automated exposure control, adjustment of the mA and/or kV according to patient size and/or use of iterative reconstruction technique. COMPARISON:  06/17/2021 FINDINGS: Lower chest: Lung bases clear Hepatobiliary: Gallbladder surgically absent.  Liver unremarkable. Pancreas: Normal appearance Spleen:  Normal appearance Adrenals/Urinary Tract: LEFT adrenal mass 1.5 x 1.4 cm previously 1.4 x 1.3 cm, 3 HU attenuation consistent with lipid rich adenoma; no follow-up imaging recommended. RIGHT adrenal mass 2.6 x 2.0 cm previously 1.9 x 1.6 cm measuring 8 HU attenuation consistent with liver batch adenoma; no follow-up imaging recommended. Kidneys,, ureters, and bladder normal appearance Stomach/Bowel: Appendix not visualized. Prior gastric bypass surgery. Bowel loops unremarkable. Vascular/Lymphatic: Minimal atherosclerotic calcification aorta. Aorta normal caliber. No adenopathy. Few scattered  pelvic phleboliths. Reproductive: Uterus surgically absent with nonvisualization of ovaries Other: No free air or free fluid. No hernia or inflammatory process. Musculoskeletal: Prior lumbar fusion. Intraspinal stimulator lower thoracic spine. Scattered degenerative disc disease changes especially L3-L4. IMPRESSION: BILATERAL lipid rich adrenal adenomas; no follow-up imaging recommended. Prior gastric bypass surgery. No acute intra-abdominal or intrapelvic abnormalities. Electronically Signed   By: Lavonia Dana M.D.   On: 07/17/2022 16:34   PROCEDURES: Critical Care performed: No Procedures MEDICATIONS ORDERED IN ED: Medications - No data to display IMPRESSION / MDM / [place] / ED COURSE  I reviewed the triage vital signs and the nursing notes.                             The patient is on the cardiac monitor to evaluate for evidence of arrhythmia and/or significant heart rate changes. Patient's presentation is most consistent with acute presentation with potential threat to life or bodily function. Given history and exam patients presentation most consistent with Lower GI bleed possibly secondary to hemorrhoid or other nonemergent cause of bleeding. I have low suspicion for Aortoenteric fistula, Upper GI Bleed, IBD, Mesenteric Ischemia, Rectal foreign body or ulcer.  Workup: CBC, BMP, Type and Screen  Disposition: Discharge. Hemodynamically stable with no gross blood on rectal exam. SRP and prompt PCP follow up.   FINAL CLINICAL IMPRESSION(S) / ED DIAGNOSES   Final diagnoses:  Rectal bleeding  Constipation, unspecified constipation type   Rx / DC Orders   ED Discharge Orders     None      Note:  This document was prepared using Dragon voice recognition software and may include unintentional dictation errors.   Naaman Plummer, MD 07/17/22 [id number]

## 2022-07-17 NOTE — ED Provider Triage Note (Signed)
Emergency Medicine Provider Triage Evaluation Note  Lindsey [name] , a 61 y.o. female  was evaluated in triage.  Pt complains of abdominal pain and rectal bleeding. Diffuse abdominal pain, bright red blood per rectum. Both began today. No HX diverticulitis according to patient. No HX hemorrhoids.  Review of Systems  Positive: Abd pain and rectal bleeding Negative: Fever, chills, urinary changes  Physical Exam  BP 107/86 (BP Location: Left Arm)   Pulse 94   Temp 98.2 F (36.8 C) (Oral)   Resp 20   Ht '5\' 7"'$  (1.702 m)   Wt 116.6 kg   SpO2 100%   BMI 40.25 kg/m  Gen:   Awake, no distress   Resp:  Normal effort  MSK:   Moves extremities without difficulty  Other:  Diffuse abd tenderness  Medical Decision Making  Medically screening exam initiated at 3:59 PM.  Appropriate orders placed.  Faithe Ariola was informed that the remainder of the evaluation will be completed by another provider, this initial triage assessment does not replace that evaluation, and the importance of remaining in the ED until their evaluation is complete.  Abd pain and rectal bleeding. Labs, urinalysis, Ct scan at this time   Darletta Moll, PA-C 07/17/22 1601

## 2022-07-18 DIAGNOSIS — K219 Gastro-esophageal reflux disease without esophagitis: Secondary | ICD-10-CM | POA: Diagnosis not present

## 2022-07-18 DIAGNOSIS — K5900 Constipation, unspecified: Secondary | ICD-10-CM | POA: Diagnosis not present

## 2022-07-18 DIAGNOSIS — Z8673 Personal history of transient ischemic attack (TIA), and cerebral infarction without residual deficits: Secondary | ICD-10-CM | POA: Diagnosis not present

## 2022-07-18 DIAGNOSIS — Z79891 Long term (current) use of opiate analgesic: Secondary | ICD-10-CM | POA: Diagnosis not present

## 2022-07-18 DIAGNOSIS — E785 Hyperlipidemia, unspecified: Secondary | ICD-10-CM | POA: Diagnosis not present

## 2022-07-18 DIAGNOSIS — I2699 Other pulmonary embolism without acute cor pulmonale: Secondary | ICD-10-CM | POA: Diagnosis not present

## 2022-07-18 DIAGNOSIS — E278 Other specified disorders of adrenal gland: Secondary | ICD-10-CM | POA: Diagnosis not present

## 2022-07-18 DIAGNOSIS — I130 Hypertensive heart and chronic kidney disease with heart failure and stage 1 through stage 4 chronic kidney disease, or unspecified chronic kidney disease: Secondary | ICD-10-CM | POA: Diagnosis not present

## 2022-07-18 DIAGNOSIS — I2510 Atherosclerotic heart disease of native coronary artery without angina pectoris: Secondary | ICD-10-CM | POA: Diagnosis not present

## 2022-07-18 DIAGNOSIS — E1122 Type 2 diabetes mellitus with diabetic chronic kidney disease: Secondary | ICD-10-CM | POA: Diagnosis not present

## 2022-07-18 DIAGNOSIS — E611 Iron deficiency: Secondary | ICD-10-CM | POA: Diagnosis not present

## 2022-07-18 DIAGNOSIS — F32A Depression, unspecified: Secondary | ICD-10-CM | POA: Diagnosis not present

## 2022-07-18 DIAGNOSIS — R1013 Epigastric pain: Secondary | ICD-10-CM | POA: Diagnosis not present

## 2022-07-18 DIAGNOSIS — R1084 Generalized abdominal pain: Secondary | ICD-10-CM | POA: Diagnosis not present

## 2022-07-18 DIAGNOSIS — R9431 Abnormal electrocardiogram [ECG] [EKG]: Secondary | ICD-10-CM | POA: Diagnosis not present

## 2022-07-18 DIAGNOSIS — Z794 Long term (current) use of insulin: Secondary | ICD-10-CM | POA: Diagnosis not present

## 2022-07-18 DIAGNOSIS — R1310 Dysphagia, unspecified: Secondary | ICD-10-CM | POA: Diagnosis not present

## 2022-07-18 DIAGNOSIS — K449 Diaphragmatic hernia without obstruction or gangrene: Secondary | ICD-10-CM | POA: Diagnosis not present

## 2022-07-18 DIAGNOSIS — J449 Chronic obstructive pulmonary disease, unspecified: Secondary | ICD-10-CM | POA: Diagnosis not present

## 2022-07-18 DIAGNOSIS — Z8616 Personal history of COVID-19: Secondary | ICD-10-CM | POA: Diagnosis not present

## 2022-07-18 DIAGNOSIS — N61 Mastitis without abscess: Secondary | ICD-10-CM | POA: Diagnosis not present

## 2022-07-18 DIAGNOSIS — G8929 Other chronic pain: Secondary | ICD-10-CM | POA: Diagnosis not present

## 2022-07-18 DIAGNOSIS — N189 Chronic kidney disease, unspecified: Secondary | ICD-10-CM | POA: Diagnosis not present

## 2022-07-18 DIAGNOSIS — E119 Type 2 diabetes mellitus without complications: Secondary | ICD-10-CM | POA: Diagnosis not present

## 2022-07-18 DIAGNOSIS — R0902 Hypoxemia: Secondary | ICD-10-CM | POA: Diagnosis not present

## 2022-07-18 DIAGNOSIS — L03313 Cellulitis of chest wall: Secondary | ICD-10-CM | POA: Diagnosis not present

## 2022-07-18 DIAGNOSIS — E279 Disorder of adrenal gland, unspecified: Secondary | ICD-10-CM | POA: Diagnosis not present

## 2022-07-18 DIAGNOSIS — I509 Heart failure, unspecified: Secondary | ICD-10-CM | POA: Diagnosis not present

## 2022-07-18 DIAGNOSIS — Z86711 Personal history of pulmonary embolism: Secondary | ICD-10-CM | POA: Diagnosis not present

## 2022-07-18 DIAGNOSIS — G4733 Obstructive sleep apnea (adult) (pediatric): Secondary | ICD-10-CM | POA: Diagnosis not present

## 2022-07-19 DIAGNOSIS — R9431 Abnormal electrocardiogram [ECG] [EKG]: Secondary | ICD-10-CM | POA: Diagnosis not present

## 2022-07-19 DIAGNOSIS — R0902 Hypoxemia: Secondary | ICD-10-CM | POA: Diagnosis not present

## 2022-07-19 DIAGNOSIS — I2510 Atherosclerotic heart disease of native coronary artery without angina pectoris: Secondary | ICD-10-CM | POA: Diagnosis not present

## 2022-07-19 DIAGNOSIS — R1013 Epigastric pain: Secondary | ICD-10-CM | POA: Diagnosis not present

## 2022-07-19 DIAGNOSIS — L03313 Cellulitis of chest wall: Secondary | ICD-10-CM | POA: Diagnosis not present

## 2022-07-19 DIAGNOSIS — K449 Diaphragmatic hernia without obstruction or gangrene: Secondary | ICD-10-CM | POA: Diagnosis not present

## 2022-07-19 DIAGNOSIS — E279 Disorder of adrenal gland, unspecified: Secondary | ICD-10-CM | POA: Diagnosis not present

## 2022-07-19 DIAGNOSIS — I2699 Other pulmonary embolism without acute cor pulmonale: Secondary | ICD-10-CM | POA: Diagnosis not present

## 2022-07-20 DIAGNOSIS — I2699 Other pulmonary embolism without acute cor pulmonale: Secondary | ICD-10-CM | POA: Diagnosis not present

## 2022-07-20 DIAGNOSIS — G8929 Other chronic pain: Secondary | ICD-10-CM | POA: Diagnosis not present

## 2022-07-20 DIAGNOSIS — E119 Type 2 diabetes mellitus without complications: Secondary | ICD-10-CM | POA: Diagnosis not present

## 2022-07-20 DIAGNOSIS — F32A Depression, unspecified: Secondary | ICD-10-CM | POA: Diagnosis not present

## 2022-07-21 DIAGNOSIS — F32A Depression, unspecified: Secondary | ICD-10-CM | POA: Diagnosis not present

## 2022-07-21 DIAGNOSIS — G8929 Other chronic pain: Secondary | ICD-10-CM | POA: Diagnosis not present

## 2022-07-21 DIAGNOSIS — E119 Type 2 diabetes mellitus without complications: Secondary | ICD-10-CM | POA: Diagnosis not present

## 2022-07-21 DIAGNOSIS — I2699 Other pulmonary embolism without acute cor pulmonale: Secondary | ICD-10-CM | POA: Diagnosis not present

## 2022-07-22 DIAGNOSIS — E119 Type 2 diabetes mellitus without complications: Secondary | ICD-10-CM | POA: Diagnosis not present

## 2022-07-22 DIAGNOSIS — F32A Depression, unspecified: Secondary | ICD-10-CM | POA: Diagnosis not present

## 2022-07-22 DIAGNOSIS — G8929 Other chronic pain: Secondary | ICD-10-CM | POA: Diagnosis not present

## 2022-07-22 DIAGNOSIS — I2699 Other pulmonary embolism without acute cor pulmonale: Secondary | ICD-10-CM | POA: Diagnosis not present

## 2022-07-23 DIAGNOSIS — I2699 Other pulmonary embolism without acute cor pulmonale: Secondary | ICD-10-CM | POA: Diagnosis not present

## 2022-07-23 DIAGNOSIS — F32A Depression, unspecified: Secondary | ICD-10-CM | POA: Diagnosis not present

## 2022-07-23 DIAGNOSIS — E119 Type 2 diabetes mellitus without complications: Secondary | ICD-10-CM | POA: Diagnosis not present

## 2022-07-23 DIAGNOSIS — G8929 Other chronic pain: Secondary | ICD-10-CM | POA: Diagnosis not present

## 2022-07-27 DIAGNOSIS — R112 Nausea with vomiting, unspecified: Secondary | ICD-10-CM | POA: Diagnosis not present

## 2022-08-04 DIAGNOSIS — R0902 Hypoxemia: Secondary | ICD-10-CM | POA: Diagnosis not present

## 2022-08-14 DIAGNOSIS — G4700 Insomnia, unspecified: Secondary | ICD-10-CM | POA: Diagnosis not present

## 2022-08-14 DIAGNOSIS — E7800 Pure hypercholesterolemia, unspecified: Secondary | ICD-10-CM | POA: Diagnosis not present

## 2022-08-14 DIAGNOSIS — G43009 Migraine without aura, not intractable, without status migrainosus: Secondary | ICD-10-CM | POA: Diagnosis not present

## 2022-08-14 DIAGNOSIS — J45998 Other asthma: Secondary | ICD-10-CM | POA: Diagnosis not present

## 2022-08-14 DIAGNOSIS — G8929 Other chronic pain: Secondary | ICD-10-CM | POA: Diagnosis not present

## 2022-08-14 DIAGNOSIS — I2699 Other pulmonary embolism without acute cor pulmonale: Secondary | ICD-10-CM | POA: Diagnosis not present

## 2022-08-14 DIAGNOSIS — I639 Cerebral infarction, unspecified: Secondary | ICD-10-CM | POA: Diagnosis not present

## 2022-08-21 DIAGNOSIS — K5090 Crohn's disease, unspecified, without complications: Secondary | ICD-10-CM | POA: Diagnosis not present

## 2022-08-21 DIAGNOSIS — R1310 Dysphagia, unspecified: Secondary | ICD-10-CM | POA: Diagnosis not present

## 2022-08-21 DIAGNOSIS — G4730 Sleep apnea, unspecified: Secondary | ICD-10-CM | POA: Diagnosis not present

## 2022-08-21 DIAGNOSIS — Z885 Allergy status to narcotic agent status: Secondary | ICD-10-CM | POA: Diagnosis not present

## 2022-08-21 DIAGNOSIS — Z9884 Bariatric surgery status: Secondary | ICD-10-CM | POA: Diagnosis not present

## 2022-08-21 DIAGNOSIS — Z886 Allergy status to analgesic agent status: Secondary | ICD-10-CM | POA: Diagnosis not present

## 2022-08-21 DIAGNOSIS — E119 Type 2 diabetes mellitus without complications: Secondary | ICD-10-CM | POA: Diagnosis not present

## 2022-08-21 DIAGNOSIS — J449 Chronic obstructive pulmonary disease, unspecified: Secondary | ICD-10-CM | POA: Diagnosis not present

## 2022-08-21 DIAGNOSIS — I69954 Hemiplegia and hemiparesis following unspecified cerebrovascular disease affecting left non-dominant side: Secondary | ICD-10-CM | POA: Diagnosis not present

## 2022-08-21 DIAGNOSIS — K449 Diaphragmatic hernia without obstruction or gangrene: Secondary | ICD-10-CM | POA: Diagnosis not present

## 2022-08-21 DIAGNOSIS — K589 Irritable bowel syndrome without diarrhea: Secondary | ICD-10-CM | POA: Diagnosis not present

## 2022-08-21 DIAGNOSIS — I252 Old myocardial infarction: Secondary | ICD-10-CM | POA: Diagnosis not present

## 2022-08-21 DIAGNOSIS — R1013 Epigastric pain: Secondary | ICD-10-CM | POA: Diagnosis not present

## 2022-08-21 DIAGNOSIS — I509 Heart failure, unspecified: Secondary | ICD-10-CM | POA: Diagnosis not present

## 2022-08-21 DIAGNOSIS — I110 Hypertensive heart disease with heart failure: Secondary | ICD-10-CM | POA: Diagnosis not present

## 2022-08-21 DIAGNOSIS — Z79899 Other long term (current) drug therapy: Secondary | ICD-10-CM | POA: Diagnosis not present

## 2022-08-21 DIAGNOSIS — Z7984 Long term (current) use of oral hypoglycemic drugs: Secondary | ICD-10-CM | POA: Diagnosis not present

## 2022-08-21 DIAGNOSIS — R112 Nausea with vomiting, unspecified: Secondary | ICD-10-CM | POA: Diagnosis not present

## 2022-08-21 DIAGNOSIS — Z87442 Personal history of urinary calculi: Secondary | ICD-10-CM | POA: Diagnosis not present

## 2022-09-03 DIAGNOSIS — R0902 Hypoxemia: Secondary | ICD-10-CM | POA: Diagnosis not present

## 2022-09-12 DIAGNOSIS — J069 Acute upper respiratory infection, unspecified: Secondary | ICD-10-CM | POA: Diagnosis not present

## 2022-09-12 DIAGNOSIS — I10 Essential (primary) hypertension: Secondary | ICD-10-CM | POA: Diagnosis not present

## 2022-09-12 DIAGNOSIS — I639 Cerebral infarction, unspecified: Secondary | ICD-10-CM | POA: Diagnosis not present

## 2022-09-12 DIAGNOSIS — M542 Cervicalgia: Secondary | ICD-10-CM | POA: Diagnosis not present

## 2022-09-12 DIAGNOSIS — G43009 Migraine without aura, not intractable, without status migrainosus: Secondary | ICD-10-CM | POA: Diagnosis not present

## 2022-09-12 DIAGNOSIS — E119 Type 2 diabetes mellitus without complications: Secondary | ICD-10-CM | POA: Diagnosis not present

## 2022-09-12 DIAGNOSIS — S92504A Nondisplaced unspecified fracture of right lesser toe(s), initial encounter for closed fracture: Secondary | ICD-10-CM | POA: Diagnosis not present

## 2022-09-12 DIAGNOSIS — I2699 Other pulmonary embolism without acute cor pulmonale: Secondary | ICD-10-CM | POA: Diagnosis not present

## 2022-09-12 DIAGNOSIS — G4700 Insomnia, unspecified: Secondary | ICD-10-CM | POA: Diagnosis not present

## 2022-09-12 DIAGNOSIS — G8929 Other chronic pain: Secondary | ICD-10-CM | POA: Diagnosis not present

## 2022-10-04 DIAGNOSIS — R0902 Hypoxemia: Secondary | ICD-10-CM | POA: Diagnosis not present

## 2022-10-11 DIAGNOSIS — G8929 Other chronic pain: Secondary | ICD-10-CM | POA: Diagnosis not present

## 2022-10-11 DIAGNOSIS — G43009 Migraine without aura, not intractable, without status migrainosus: Secondary | ICD-10-CM | POA: Diagnosis not present

## 2022-10-11 DIAGNOSIS — I639 Cerebral infarction, unspecified: Secondary | ICD-10-CM | POA: Diagnosis not present

## 2022-10-11 DIAGNOSIS — E119 Type 2 diabetes mellitus without complications: Secondary | ICD-10-CM | POA: Diagnosis not present

## 2022-10-11 DIAGNOSIS — I10 Essential (primary) hypertension: Secondary | ICD-10-CM | POA: Diagnosis not present

## 2022-10-11 DIAGNOSIS — G4700 Insomnia, unspecified: Secondary | ICD-10-CM | POA: Diagnosis not present

## 2022-10-18 ENCOUNTER — Ambulatory Visit (INDEPENDENT_AMBULATORY_CARE_PROVIDER_SITE_OTHER): Payer: Medicare Other | Admitting: Surgery

## 2022-10-18 ENCOUNTER — Encounter: Payer: Self-pay | Admitting: Surgery

## 2022-10-18 VITALS — BP 110/73 | HR 120 | Temp 98.9°F | Ht 67.0 in | Wt 284.2 lb

## 2022-10-18 DIAGNOSIS — R1013 Epigastric pain: Secondary | ICD-10-CM

## 2022-10-18 DIAGNOSIS — Z6841 Body Mass Index (BMI) 40.0 and over, adult: Secondary | ICD-10-CM | POA: Diagnosis not present

## 2022-10-18 NOTE — Patient Instructions (Addendum)
Your Barium Swallow test is scheduled for 11/22/22 at 12 pm (arrive by 11:30 am) @ [facility]. Nothing to eat or drink 3 hours prior.    A referral has been placed to Bariatric Surgery (Dr. Redmond Pulling). They will call you with an appointment.   If you have any concerns or questions, please feel free to call our office.

## 2022-10-18 NOTE — Progress Notes (Addendum)
Patient ID: Lindsey [name], female   DOB: 04-27-1961, 61 y.o.   MRN: [id number]  HPI Lindsey [name] is a 60 y.o. female seen in consultation for questionable hiatal hernia.THis was discovered on en endoscopy at [facility], I do not have access to the images but I have reviewed the report :A hiatal hernia was present. The entirety of the gastric pouch appeared to be within the hiatal hernia. The GJ anastomosis was patent. Apparently she was told by GI at [facility] that the [facility] needed to be repair but the wait time for a foregut surgeon at that institution was too long.  Please Note the patient has abdominal pain that seems to be in the epigastric area.  It is intermittent colicky and not characteristic of any specific GI pathology.  Of note she does have a history of chronic pain, she does have an a stimulator. She did have a history of gastric bypass more than a decade ago fired via hospital.  He has regained the weight.  Her current BMI is 44.5.  She does endorse some reflux. She did have recent CT that I have personally reviewed showing evidence of bilateral adrenal lipoid nodules.  She does have gastric bypass anatomy without any obvious complications.  I do  appreciate a sliding component of her gastric pouch that is above the crus but based soley on radiographic findings I would not recommend repair necessarily, She did have a prior endoscopy couple years ago that have personally reviewed showing evidence of an esophageal stricture that was dilated.  She felt better after that.  She has been followed by Dr. Alice [name] from GI. Her surgical hx include cholecystectomy, ventral hernia repair x 2.    HPI  Past Medical History:  Diagnosis Date   Allergy    Anemia    Arthritis    knees   CHF (congestive heart failure) (HCC)    COPD (chronic obstructive pulmonary disease) ([place])    Diabetes mellitus without complication (HCC)    diet controlled   GERD (gastroesophageal reflux disease)    Heart attack ([place])  2002   Hyperlipidemia    Hypertension    PUD (peptic ulcer disease)    Sleep apnea    can't tolerate CPAP.  Uses O2 only   Spinal cord stimulator status    for lower back pain   Stroke [facility]) 2004   "mini-stroke"   Vertigo    Wears dentures    full upper and lower    Past Surgical History:  Procedure Laterality Date   ABDOMINAL HYSTERECTOMY     back surgery     broken wrist     COLONOSCOPY WITH PROPOFOL N/A 01/30/2017   Procedure: COLONOSCOPY WITH PROPOFOL;  Surgeon: Jonathon Bellows, MD;  Location: [facility] ENDOSCOPY;  Service: Endoscopy;  Laterality: N/A;   COLONOSCOPY WITH PROPOFOL N/A 02/20/2017   Procedure: Colonoscopy with propofol ;  Surgeon: Jonathon Bellows, MD;  Location: [facility] ENDOSCOPY;  Service: Endoscopy;  Laterality: N/A;   COLONOSCOPY WITH PROPOFOL N/A 03/13/2017   Procedure: COLONOSCOPY WITH PROPOFOL;  Surgeon: Jonathon Bellows, MD;  Location: [facility] ENDOSCOPY;  Service: Endoscopy;  Laterality: N/A;   COLONOSCOPY WITH PROPOFOL N/A 04/17/2017   Procedure: COLONOSCOPY WITH PROPOFOL;  Surgeon: Jonathon Bellows, MD;  Location: [facility] ENDOSCOPY;  Service: Endoscopy;  Laterality: N/A;   ESOPHAGOGASTRODUODENOSCOPY N/A 05/05/2020   Procedure: ESOPHAGOGASTRODUODENOSCOPY (EGD);  Surgeon: Toledo, Benay Pike, MD;  Location: [facility] ENDOSCOPY;  Service: Gastroenterology;  Laterality: N/A;   ESOPHAGOGASTRODUODENOSCOPY (EGD) WITH PROPOFOL N/A 09/28/2016  Procedure: ESOPHAGOGASTRODUODENOSCOPY (EGD) WITH PROPOFOL;  Surgeon: Manya Silvas, MD;  Location: [facility] ENDOSCOPY;  Service: Endoscopy;  Laterality: N/A;   ESOPHAGOGASTRODUODENOSCOPY (EGD) WITH PROPOFOL N/A 03/13/2017   Procedure: ESOPHAGOGASTRODUODENOSCOPY (EGD) WITH PROPOFOL;  Surgeon: Jonathon Bellows, MD;  Location: [facility] ENDOSCOPY;  Service: Endoscopy;  Laterality: N/A;   GALLBLADDER SURGERY     GASTRIC BYPASS     GIVENS CAPSULE STUDY N/A 03/13/2017   Procedure: GIVENS CAPSULE STUDY;  Surgeon: Jonathon Bellows, MD;  Location: [facility] ENDOSCOPY;  Service: Endoscopy;  Laterality:  N/A;   HERNIA REPAIR     JOINT REPLACEMENT  1995   REPLACEMENT TOTAL KNEE     ROTATOR CUFF REPAIR      Family History  Problem Relation Age of Onset   Hypertension Father    COPD Father    Heart disease Father    Breast cancer Neg Hx     Social History Social History   Tobacco Use   Smoking status: Never   Smokeless tobacco: Never  Vaping Use   Vaping Use: Never used  Substance Use Topics   Alcohol use: No    Alcohol/week: 0.0 standard drinks of alcohol   Drug use: No    Allergies  Allergen Reactions   Morphine Hives   Iodinated Contrast Media Hives   Aspirin Hives    Noted on MD progress notes and discussed with MD 09/02/15   Etodolac Hives   Ibuprofen Hives   Shellfish Allergy Hives   Tylenol [Acetaminophen] Hives    Upset stomach     Current Outpatient Medications  Medication Sig Dispense Refill   albuterol (PROVENTIL HFA;VENTOLIN HFA) 108 (90 BASE) MCG/ACT inhaler Inhale 2 puffs into the lungs every 4 (four) hours as needed for wheezing or shortness of breath.     ALPRAZolam (XANAX) 0.25 MG tablet Take 0.25 mg by mouth 3 (three) times daily.     atorvastatin (LIPITOR) 40 MG tablet Take 40 mg by mouth daily.     busPIRone (BUSPAR) 7.5 MG tablet Take 7.5 mg by mouth 2 (two) times daily.     Calcium Carbonate-Vitamin D (OYSTER SHELL CALCIUM 500 + D PO) Take 1 tablet by mouth daily.  0   cloNIDine (CATAPRES) 0.1 MG tablet Take 0.1 mg qhs 60 tablet 11   diclofenac Sodium (VOLTAREN) 1 % GEL Apply 4 g topically See admin instructions. Apply 4 grams twice a week to neck.     ELIQUIS 5 MG TABS tablet Take 5 mg by mouth 2 (two) times daily.     esomeprazole (NEXIUM) 40 MG capsule Take 40 mg by mouth 2 (two) times daily.     ferrous sulfate 325 (65 FE) MG tablet Take 1 tablet (325 mg total) by mouth 2 (two) times daily with a meal. 60 tablet 0   fexofenadine (ALLEGRA) 180 MG tablet Take 180 mg by mouth daily.   0   fluticasone (FLONASE) 50 MCG/ACT nasal spray Place 1  spray into both nostrils daily.     folic acid (FOLVITE) 1 MG tablet Take 1 mg by mouth daily.     hydrOXYzine (ATARAX/VISTARIL) 25 MG tablet Take 25 mg by mouth 3 (three) times daily.     meclizine (ANTIVERT) 25 MG tablet Take 1 tablet (25 mg total) by mouth 3 (three) times daily as needed for dizziness or nausea. 30 tablet 1   metoprolol succinate (TOPROL-XL) 100 MG 24 hr tablet Take 100 mg by mouth daily.     naloxone (NARCAN) nasal spray 4 mg/0.1  mL One intranasal for altered mental status 2 each 0   OLMESARTAN MEDOXOMIL PO Take by mouth.     oxybutynin (DITROPAN) 5 MG tablet Take 5 mg by mouth 2 (two) times daily.     polyethylene glycol (MIRALAX / GLYCOLAX) 17 g packet Take 17 g by mouth daily as needed for mild constipation. 14 each 0   Suvorexant (BELSOMRA) 15 MG TABS Take 1 tablet by mouth at bedtime.     TRADJENTA 5 MG TABS tablet Take 5 mg by mouth daily.     TRESIBA FLEXTOUCH 200 UNIT/ML FlexTouch Pen Inject 26 Units into the skin every morning.     UBRELVY 100 MG TABS Take 100 mg by mouth as needed.     zolpidem (AMBIEN) 10 MG tablet Take 0.5 tablets (5 mg total) by mouth at bedtime as needed for sleep. 6 tablet 0   mirtazapine (REMERON) 45 MG tablet Take 1 tablet (45 mg total) by mouth at bedtime for 7 days. 7 tablet 0   sucralfate (CARAFATE) 1 GM/10ML suspension Take 10 mLs (1 g total) by mouth 4 (four) times daily -  with meals and at bedtime for 14 days. 560 mL 0   No current facility-administered medications for this visit.     Review of Systems Full ROS  was asked and was negative except for the information on the HPI  Physical Exam Blood pressure 110/73, pulse (!) 120, temperature 98.9 F (37.2 C), temperature source Oral, height '5\' 7"'$  (1.702 m), weight 284 lb 3.2 oz (128.9 kg), SpO2 97 %. CONSTITUTIONAL: NAD, BMI 44.5. EYES: Pupils are equal, round, and reactive to light, Sclera are non-icteric. EARS, NOSE, MOUTH AND THROAT: The oropharynx is clear. The oral mucosa  is pink and moist. Hearing is intact to voice. LYMPH NODES:  Lymph nodes in the neck are normal. RESPIRATORY:  Lungs are clear. There is normal respiratory effort, with equal breath sounds bilaterally, and without pathologic use of accessory muscles. CARDIOVASCULAR: Heart is regular without murmurs, gallops, or rubs. GI: The abdomen is  soft, nontender, and nondistended. There are no palpable masses. There is no hepatosplenomegaly. There are normal bowel sounds in all quadrants. GU: Rectal deferred.   MUSCULOSKELETAL: Normal muscle strength and tone. No cyanosis or edema.   SKIN: Turgor is good and there are no pathologic skin lesions or ulcers. NEUROLOGIC: Motor and sensation is grossly normal. Cranial nerves are grossly intact. PSYCH:  Oriented to person, place and time. Affect is normal.  Data Reviewed  I have personally reviewed the patient's imaging, laboratory findings and medical records.    Assessment/Plan 61 year old female with chronic abdominal pain and complex nonspecific GI issues.  Sliding hiatal hernia w gaatric pouch from prior bypass within the hernia sac.  In the setting of her high BMI I definitely do not recommend any surgical intervention for presumed hiatal hernia.  She may be having GI symptoms but given her BMI any HH repairs  is relatively contraindicated.  I would like to get her an opinion with bariatric surgery to see if there is any other alternatives.  I was very candid with her and very honest that I do not think that repairing her hiatal hernia will give her enough relief. I would like to make sure that there is no strictures with a barium swallow. I will get Dr. Redmond Pulling form [facility] to opine on this matter Please note that I spent 60 minutes in this encounter including personally reviewing imaging studies, coordinating her care, placing  orders, counseling the patient and family and performing appropriate documentation   Caroleen Hamman, MD FACS General  Surgeon 10/18/2022, 12:13 PM

## 2022-10-23 ENCOUNTER — Ambulatory Visit: Payer: Medicare Other

## 2022-10-26 ENCOUNTER — Ambulatory Visit
Admission: RE | Admit: 2022-10-26 | Discharge: 2022-10-26 | Disposition: A | Discharge status: Home / Self Care | Payer: Medicare Other | Source: Ambulatory Visit | Attending: Surgery | Admitting: Surgery

## 2022-10-26 DIAGNOSIS — R1013 Epigastric pain: Secondary | ICD-10-CM | POA: Insufficient documentation

## 2022-10-26 DIAGNOSIS — R1310 Dysphagia, unspecified: Secondary | ICD-10-CM | POA: Diagnosis not present

## 2022-10-26 DIAGNOSIS — K224 Dyskinesia of esophagus: Secondary | ICD-10-CM | POA: Diagnosis not present

## 2022-10-27 ENCOUNTER — Other Ambulatory Visit: Payer: Medicare Other

## 2022-11-03 DIAGNOSIS — R0902 Hypoxemia: Secondary | ICD-10-CM | POA: Diagnosis not present

## 2022-11-10 DIAGNOSIS — E7800 Pure hypercholesterolemia, unspecified: Secondary | ICD-10-CM | POA: Diagnosis not present

## 2022-11-10 DIAGNOSIS — I639 Cerebral infarction, unspecified: Secondary | ICD-10-CM | POA: Diagnosis not present

## 2022-11-10 DIAGNOSIS — G4700 Insomnia, unspecified: Secondary | ICD-10-CM | POA: Diagnosis not present

## 2022-11-10 DIAGNOSIS — G8929 Other chronic pain: Secondary | ICD-10-CM | POA: Diagnosis not present

## 2022-11-10 DIAGNOSIS — E119 Type 2 diabetes mellitus without complications: Secondary | ICD-10-CM | POA: Diagnosis not present

## 2022-11-10 DIAGNOSIS — J45998 Other asthma: Secondary | ICD-10-CM | POA: Diagnosis not present

## 2022-11-10 DIAGNOSIS — G43009 Migraine without aura, not intractable, without status migrainosus: Secondary | ICD-10-CM | POA: Diagnosis not present

## 2022-11-30 DIAGNOSIS — R112 Nausea with vomiting, unspecified: Secondary | ICD-10-CM | POA: Diagnosis not present

## 2022-12-04 DIAGNOSIS — R0902 Hypoxemia: Secondary | ICD-10-CM | POA: Diagnosis not present

## 2022-12-07 DIAGNOSIS — G8929 Other chronic pain: Secondary | ICD-10-CM | POA: Diagnosis not present

## 2022-12-07 DIAGNOSIS — E119 Type 2 diabetes mellitus without complications: Secondary | ICD-10-CM | POA: Diagnosis not present

## 2022-12-13 DIAGNOSIS — E119 Type 2 diabetes mellitus without complications: Secondary | ICD-10-CM | POA: Diagnosis not present

## 2022-12-13 DIAGNOSIS — G4700 Insomnia, unspecified: Secondary | ICD-10-CM | POA: Diagnosis not present

## 2022-12-13 DIAGNOSIS — I951 Orthostatic hypotension: Secondary | ICD-10-CM | POA: Diagnosis not present

## 2022-12-13 DIAGNOSIS — I2699 Other pulmonary embolism without acute cor pulmonale: Secondary | ICD-10-CM | POA: Diagnosis not present

## 2022-12-13 DIAGNOSIS — E7800 Pure hypercholesterolemia, unspecified: Secondary | ICD-10-CM | POA: Diagnosis not present

## 2022-12-13 DIAGNOSIS — E1122 Type 2 diabetes mellitus with diabetic chronic kidney disease: Secondary | ICD-10-CM | POA: Diagnosis not present

## 2022-12-13 DIAGNOSIS — G43009 Migraine without aura, not intractable, without status migrainosus: Secondary | ICD-10-CM | POA: Diagnosis not present

## 2022-12-13 DIAGNOSIS — I10 Essential (primary) hypertension: Secondary | ICD-10-CM | POA: Diagnosis not present

## 2022-12-13 DIAGNOSIS — G8929 Other chronic pain: Secondary | ICD-10-CM | POA: Diagnosis not present

## 2022-12-13 DIAGNOSIS — I639 Cerebral infarction, unspecified: Secondary | ICD-10-CM | POA: Diagnosis not present

## 2022-12-28 ENCOUNTER — Other Ambulatory Visit: Payer: Self-pay | Admitting: Internal Medicine

## 2023-01-01 ENCOUNTER — Other Ambulatory Visit: Payer: Self-pay

## 2023-01-04 DIAGNOSIS — R0902 Hypoxemia: Secondary | ICD-10-CM | POA: Diagnosis not present

## 2023-01-09 ENCOUNTER — Other Ambulatory Visit: Payer: Self-pay

## 2023-01-09 MED ORDER — OXYCODONE HCL 20 MG PO TABS: 1.0000 | ORAL_TABLET | Freq: Four times a day (QID) | ORAL | 0 refills | Status: DC

## 2023-01-17 DIAGNOSIS — L089 Local infection of the skin and subcutaneous tissue, unspecified: Secondary | ICD-10-CM | POA: Diagnosis not present

## 2023-01-17 DIAGNOSIS — S61012A Laceration without foreign body of left thumb without damage to nail, initial encounter: Secondary | ICD-10-CM | POA: Diagnosis not present

## 2023-01-29 ENCOUNTER — Other Ambulatory Visit: Payer: Self-pay | Admitting: Internal Medicine

## 2023-01-30 ENCOUNTER — Other Ambulatory Visit: Payer: Self-pay | Admitting: Internal Medicine

## 2023-02-04 ENCOUNTER — Emergency Department: Payer: 59

## 2023-02-04 ENCOUNTER — Emergency Department
Admission: EM | Admit: 2023-02-04 | Discharge: 2023-02-04 | Disposition: A | Discharge status: Home / Self Care | Payer: 59 | Attending: Emergency Medicine | Admitting: Emergency Medicine

## 2023-02-04 DIAGNOSIS — I129 Hypertensive chronic kidney disease with stage 1 through stage 4 chronic kidney disease, or unspecified chronic kidney disease: Secondary | ICD-10-CM | POA: Diagnosis not present

## 2023-02-04 DIAGNOSIS — S20211A Contusion of right front wall of thorax, initial encounter: Secondary | ICD-10-CM | POA: Insufficient documentation

## 2023-02-04 DIAGNOSIS — E1122 Type 2 diabetes mellitus with diabetic chronic kidney disease: Secondary | ICD-10-CM | POA: Insufficient documentation

## 2023-02-04 DIAGNOSIS — Z8673 Personal history of transient ischemic attack (TIA), and cerebral infarction without residual deficits: Secondary | ICD-10-CM | POA: Diagnosis not present

## 2023-02-04 DIAGNOSIS — N1831 Chronic kidney disease, stage 3a: Secondary | ICD-10-CM | POA: Insufficient documentation

## 2023-02-04 DIAGNOSIS — W01198A Fall on same level from slipping, tripping and stumbling with subsequent striking against other object, initial encounter: Secondary | ICD-10-CM | POA: Diagnosis not present

## 2023-02-04 DIAGNOSIS — I2510 Atherosclerotic heart disease of native coronary artery without angina pectoris: Secondary | ICD-10-CM | POA: Insufficient documentation

## 2023-02-04 DIAGNOSIS — R0781 Pleurodynia: Secondary | ICD-10-CM | POA: Diagnosis not present

## 2023-02-04 MED ORDER — LIDOCAINE 5 % EX PTCH
1.0000 | MEDICATED_PATCH | CUTANEOUS | Status: DC
  Administered 2023-02-04: 1 via TRANSDERMAL
  Filled 2023-02-04 (×2): qty 1

## 2023-02-04 MED ORDER — LIDOCAINE 5 % EX PTCH: 1.0000 | MEDICATED_PATCH | Freq: Two times a day (BID) | CUTANEOUS | 0 refills | Status: AC

## 2023-02-04 NOTE — ED Provider Notes (Signed)
[facility] Provider Note    Event Date/Time   First MD Initiated Contact with Patient 02/04/23 1249     (approximate)   History   Fall   HPI  Lindsey [name] is a 62 y.o. female with a past medical history of obesity, pulmonary embolism on Eliquis, hypertension, hyperlipidemia, diabetes who presents today for evaluation of right-sided rib pain.  Patient reports that she tripped over a trash bag that was full of clothes and hit her ribs on the side of the trash can.  She reports that this occurred yesterday.  She reports that she is continue to have pain in her right ribs ever since.  She has pain with palpation of the area and when she takes deep breaths.  She denies abdominal pain.  She has not had any hematuria.  She has been able to ambulate.  There is no head strike or LOC.  Patient Active Problem List   Diagnosis Date Noted   UTI (urinary tract infection) 04/02/2022   Pulmonary embolism ([place]) 04/02/2022   Headache 04/02/2022   HTN (hypertension) 04/02/2022   HLD (hyperlipidemia) 04/02/2022   Type II diabetes mellitus with renal manifestations ([place]) 04/02/2022   Acute renal failure superimposed on stage 3a chronic kidney disease ([place]) 04/02/2022   Stroke ([place]) 04/02/2022   Depression with anxiety 04/02/2022   CAD (coronary artery disease) 04/02/2022   Morbid obesity with BMI of 40.0-44.9, adult ([place]) 04/02/2022   Acute renal failure superimposed on chronic kidney disease ([place])    Generalized headache    Acute pulmonary embolism without acute cor pulmonale ([place]) 06/18/2021   Elevated LFTs    Other chronic pain    Acute respiratory failure with hypoxia and hypercapnia (HCC) 06/06/2021   Opioid overdose ([place]) 06/06/2021   Poorly controlled type 2 diabetes mellitus ([place]) 06/06/2021   Transaminitis 06/06/2021   Lactic acidosis 06/06/2021   Chronic obstructive pulmonary disease, unspecified COPD type ([place]) 06/05/2021   Acute lower UTI 05/08/2020    Upper GI bleed 05/04/2020   Acute kidney injury superimposed on chronic kidney disease ([place]) 06/09/2017   Symptomatic anemia 12/24/2016   Chest pain 12/23/2016   Dyspnea 12/23/2016   Palpitations 12/23/2016   Hypotension 09/29/2016   Renal insufficiency 09/29/2016   Anemia 09/29/2016   Leukocytosis 09/29/2016   Prediabetes 09/29/2016   Jejunal inflammation 09/29/2016   Abnormal findings on esophagogastroduodenoscopy (EGD) 09/29/2016   Vitamin D deficiency 09/29/2016   Syncope 09/27/2016   Medication overuse headache 11/08/2015   Morbid obesity with BMI of 45.0-49.9, adult ([place]) 11/08/2015   Seizures ([place]) 09/02/2015   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      Excl. in [place]?     Most recent vital signs: Vitals:   02/04/23 1230  BP: (!) 135/91  Pulse: 91  Resp: 20  Temp: 97.9 F (36.6 C)  SpO2: 96%    Physical Exam Vitals and nursing note reviewed.  Constitutional:      General: Awake and alert. No acute distress.    Appearance: Normal appearance. The patient is obese.  HENT:     Head: Normocephalic and atraumatic.     Mouth: Mucous membranes are moist.  Eyes:     General: PERRL. Normal EOMs        Right eye: No discharge.        Left eye: No discharge.     Conjunctiva/sclera: Conjunctivae normal.  Cardiovascular:     Rate and Rhythm: Normal rate and regular rhythm.     Pulses:  Normal pulses.  Pulmonary:     Effort: Pulmonary effort is normal. No respiratory distress.     Breath sounds: Normal breath sounds.  Right lateral and anterior rib pain, no ecchymosis or wounds noted Abdominal:     Abdomen is soft. There is no abdominal tenderness. No rebound or guarding. No distention.  No right upper quadrant abdominal tenderness.  No abdominal ecchymosis Musculoskeletal:        General: No swelling. Normal range of motion.     Cervical back: Normal range of motion and neck supple.  Skin:    General: Skin is warm and dry.     Capillary Refill: Capillary refill takes less than 2 seconds.     Findings: No rash.  Neurological:     Mental Status: The patient is awake and alert.      ED Results / Procedures / Treatments   Labs (all labs ordered are listed, but only abnormal results are displayed) Labs Reviewed - No data to display   EKG     RADIOLOGY I independently reviewed and interpreted imaging and agree with radiologists findings.     PROCEDURES:  Critical Care performed:   Procedures   MEDICATIONS ORDERED IN ED: Medications  lidocaine (LIDODERM) 5 % 1 patch (1 patch Transdermal Patch Applied 02/04/23 1314)     IMPRESSION / MDM / ASSESSMENT AND PLAN / ED COURSE  I reviewed the triage vital signs and the nursing notes.   Differential diagnosis includes, but is not limited to, rib fracture, contusion, costochondritis.  Patient is awake and alert, hemodynamically stable and afebrile.  She has no abdominal tenderness, no hemodynamic instability to suggest intra-abdominal injury.  X-ray was obtained which reveals no rib fracture or pneumothorax.  Patient is reassured by these results.  She was treated symptomatically with Lidoderm patch which provided significant pain relief.  She requested a prescription for these which was sent to her pharmacy for her.  We discussed strict return precautions and the importance of close outpatient follow-up.   Patient understands and agrees with plan.  She was discharged in stable condition.   Patient's presentation is most consistent with acute complicated illness / injury requiring diagnostic workup.   Clinical Course as of 02/04/23 1720  Nancy Fetter Feb 04, 2023  1329 Patient reports that her pain is significantly improved after the Lidoderm patch as she feels ready to be discharged [JP]    Clinical Course User Index [JP] Guy Seese, Clarnce Flock, PA-C     FINAL CLINICAL IMPRESSION(S) / ED DIAGNOSES   Final diagnoses:  Contusion of  right chest wall, initial encounter     Rx / DC Orders   ED Discharge Orders          Ordered    lidocaine (LIDODERM) 5 %  Every 12 hours        02/04/23 1329             Note:  This document was prepared using Dragon voice recognition software and may include unintentional dictation errors.   Emeline Gins 02/04/23 1720    Harvest Dark, MD 02/07/23 1358

## 2023-02-04 NOTE — ED Triage Notes (Signed)
Pt presents to the ED via POV due to a trip and fall yesterday yesterday. Pt states she is having pain on the R side of ribs. Pt denies NVD. Pt denies LOC but takes blood thinners. Pt denies SOB. Pt NAD

## 2023-02-04 NOTE — Discharge Instructions (Signed)
You may use the Lidoderm patches as prescribed to help with your pain.  Please return for any new, worsening, or change in symptoms or other concerns including abdominal pain, trouble breathing, blood in urine, or any other concerns.  It was a pleasure caring for you today.

## 2023-02-07 ENCOUNTER — Other Ambulatory Visit: Payer: Self-pay | Admitting: Internal Medicine

## 2023-02-07 MED ORDER — OXYCODONE HCL 20 MG PO TABS: 1.0000 | ORAL_TABLET | Freq: Four times a day (QID) | ORAL | 0 refills | Status: DC

## 2023-02-09 ENCOUNTER — Ambulatory Visit: Payer: Self-pay | Admitting: Internal Medicine

## 2023-02-13 ENCOUNTER — Telehealth: Payer: Self-pay

## 2023-02-13 NOTE — Telephone Encounter (Signed)
        Patient  visited [facility] on 02/04/2023  for fall.   Telephone encounter attempt :  1st  A HIPAA compliant voice message was left requesting a return call.  Instructed patient to call back at [phone].   [place] Resource Care Guide   ??[email or web address]  ?? [id number]   Website: [email or web address]  [email or web address]

## 2023-02-14 ENCOUNTER — Other Ambulatory Visit: Payer: Self-pay | Admitting: Internal Medicine

## 2023-02-15 ENCOUNTER — Telehealth: Payer: Self-pay

## 2023-02-15 NOTE — Telephone Encounter (Signed)
        Patient  visited [facility] on 02/04/2023  for fall.   Telephone encounter attempt :  2nd  A HIPAA compliant voice message was left requesting a return call.  Instructed patient to call back at [phone].   [place] Resource Care Guide   ??[name]  ?? [id number]   Website: [email or web address]  [email or web address]

## 2023-02-16 ENCOUNTER — Telehealth: Payer: Self-pay

## 2023-02-16 NOTE — Telephone Encounter (Signed)
        Patient  visited [facility] on 02/04/2023  for fall.   Telephone encounter attempt :  3rd  A HIPAA compliant voice message was left requesting a return call.  Instructed patient to call back at [phone].   [place] Resource Care Guide   [email or web address]  ?? [id number]   Website: [email or web address]  [email or web address]

## 2023-02-21 ENCOUNTER — Encounter: Payer: Self-pay | Admitting: Internal Medicine

## 2023-02-21 ENCOUNTER — Ambulatory Visit (INDEPENDENT_AMBULATORY_CARE_PROVIDER_SITE_OTHER): Payer: 59 | Admitting: Internal Medicine

## 2023-02-21 VITALS — BP 110/70 | HR 89 | Temp 96.8°F | Ht 67.0 in | Wt 268.4 lb

## 2023-02-21 DIAGNOSIS — I2609 Other pulmonary embolism with acute cor pulmonale: Secondary | ICD-10-CM

## 2023-02-21 DIAGNOSIS — E119 Type 2 diabetes mellitus without complications: Secondary | ICD-10-CM

## 2023-02-21 DIAGNOSIS — I10 Essential (primary) hypertension: Secondary | ICD-10-CM | POA: Diagnosis not present

## 2023-02-21 DIAGNOSIS — G8929 Other chronic pain: Secondary | ICD-10-CM

## 2023-02-21 DIAGNOSIS — I2782 Chronic pulmonary embolism: Secondary | ICD-10-CM | POA: Diagnosis not present

## 2023-02-21 DIAGNOSIS — K922 Gastrointestinal hemorrhage, unspecified: Secondary | ICD-10-CM | POA: Diagnosis not present

## 2023-02-21 LAB — GLUCOSE, POCT (MANUAL RESULT ENTRY): POC Glucose: 193 mg/dl — AB (ref 70–99)

## 2023-02-21 MED ORDER — OXYCODONE HCL 20 MG PO TABS: 1.0000 | ORAL_TABLET | Freq: Four times a day (QID) | ORAL | 0 refills | Status: DC

## 2023-02-21 NOTE — Progress Notes (Signed)
Established Patient Office Visit  Subjective:  Patient ID: Lindsey [name], female    DOB: 12-Mar-1961  Age: 62 y.o. MRN: [id number]  Chief Complaint  Patient presents with   Follow-up    PM    Here for pain management follow up. Chronic pain well controlled on current analgesia. Last UDS satisfactory and pill counts have also been satisfactory. Signed pain contract today.     No other concerns at this time.   Past Medical History:  Diagnosis Date   Allergy    Anemia    Arthritis    knees   CHF (congestive heart failure) (HCC)    COPD (chronic obstructive pulmonary disease) (HCC)    Diabetes mellitus without complication (HCC)    diet controlled   GERD (gastroesophageal reflux disease)    Heart attack ([place]) 2002   Hyperlipidemia    Hypertension    PUD (peptic ulcer disease)    Sleep apnea    can't tolerate CPAP.  Uses O2 only   Spinal cord stimulator status    for lower back pain   Stroke [facility]) 2004   "mini-stroke"   Vertigo    Wears dentures    full upper and lower    Past Surgical History:  Procedure Laterality Date   ABDOMINAL HYSTERECTOMY     back surgery     broken wrist     COLONOSCOPY WITH PROPOFOL N/A 01/30/2017   Procedure: COLONOSCOPY WITH PROPOFOL;  Surgeon: Jonathon Bellows, MD;  Location: [facility] ENDOSCOPY;  Service: Endoscopy;  Laterality: N/A;   COLONOSCOPY WITH PROPOFOL N/A 02/20/2017   Procedure: Colonoscopy with propofol ;  Surgeon: Jonathon Bellows, MD;  Location: [facility] ENDOSCOPY;  Service: Endoscopy;  Laterality: N/A;   COLONOSCOPY WITH PROPOFOL N/A 03/13/2017   Procedure: COLONOSCOPY WITH PROPOFOL;  Surgeon: Jonathon Bellows, MD;  Location: [facility] ENDOSCOPY;  Service: Endoscopy;  Laterality: N/A;   COLONOSCOPY WITH PROPOFOL N/A 04/17/2017   Procedure: COLONOSCOPY WITH PROPOFOL;  Surgeon: Jonathon Bellows, MD;  Location: [facility] ENDOSCOPY;  Service: Endoscopy;  Laterality: N/A;   ESOPHAGOGASTRODUODENOSCOPY N/A 05/05/2020   Procedure: ESOPHAGOGASTRODUODENOSCOPY (EGD);   Surgeon: Toledo, Benay Pike, MD;  Location: [facility] ENDOSCOPY;  Service: Gastroenterology;  Laterality: N/A;   ESOPHAGOGASTRODUODENOSCOPY (EGD) WITH PROPOFOL N/A 09/28/2016   Procedure: ESOPHAGOGASTRODUODENOSCOPY (EGD) WITH PROPOFOL;  Surgeon: Manya Silvas, MD;  Location: [facility] ENDOSCOPY;  Service: Endoscopy;  Laterality: N/A;   ESOPHAGOGASTRODUODENOSCOPY (EGD) WITH PROPOFOL N/A 03/13/2017   Procedure: ESOPHAGOGASTRODUODENOSCOPY (EGD) WITH PROPOFOL;  Surgeon: Jonathon Bellows, MD;  Location: [facility] ENDOSCOPY;  Service: Endoscopy;  Laterality: N/A;   GALLBLADDER SURGERY     GASTRIC BYPASS     GIVENS CAPSULE STUDY N/A 03/13/2017   Procedure: GIVENS CAPSULE STUDY;  Surgeon: Jonathon Bellows, MD;  Location: [facility] ENDOSCOPY;  Service: Endoscopy;  Laterality: N/A;   HERNIA REPAIR     JOINT REPLACEMENT  1995   REPLACEMENT TOTAL KNEE     ROTATOR CUFF REPAIR      Social History   Socioeconomic History   Marital status: Widowed    Spouse name: Not on file   Number of children: Not on file   Years of education: Not on file   Highest education level: Not on file  Occupational History   Occupation: disabled  Tobacco Use   Smoking status: Never   Smokeless tobacco: Never  Vaping Use   Vaping Use: Never used  Substance and Sexual Activity   Alcohol use: No    Alcohol/week: 0.0 standard drinks of alcohol   Drug  use: No   Sexual activity: Yes  Other Topics Concern   Not on file  Social History Narrative   Not on file   Social Determinants of Health   Financial Resource Strain: Not on file  Food Insecurity: Not on file  Transportation Needs: Not on file  Physical Activity: Not on file  Stress: Not on file  Social Connections: Not on file  Intimate Partner Violence: Not on file    Family History  Problem Relation Age of Onset   Hypertension Father    COPD Father    Heart disease Father    Breast cancer Neg Hx     Allergies  Allergen Reactions   Morphine Hives   Iodinated Contrast Media Hives    Aspirin Hives    Noted on MD progress notes and discussed with MD 09/02/15   Etodolac Hives   Ibuprofen Hives   Shellfish Allergy Hives   Tylenol [Acetaminophen] Hives    Upset stomach     Review of Systems  Constitutional: Negative.   HENT: Negative.    Eyes: Negative.   Respiratory: Negative.    Cardiovascular: Negative.   Gastrointestinal: Negative.   Genitourinary: Negative.   Skin: Negative.   Neurological: Negative.   Endo/Heme/Allergies: Negative.        Objective:   BP 110/70   Pulse 89   Temp (!) 96.8 F (36 C) (Tympanic)   Ht 5\' 7"  (1.702 m)   Wt 268 lb 6.4 oz (121.7 kg)   SpO2 98%   BMI 42.04 kg/m   Vitals:   02/21/23 1031  BP: 110/70  Pulse: 89  Temp: (!) 96.8 F (36 C)  Height: 5\' 7"  (1.702 m)  Weight: 268 lb 6.4 oz (121.7 kg)  SpO2: 98%  TempSrc: Tympanic  BMI (Calculated): 42.03    Physical Exam Vitals reviewed.  Constitutional:      General: She is not in acute distress.    Appearance: She is obese.  HENT:     Head: Normocephalic.     Nose: Nose normal.     Mouth/Throat:     Mouth: Mucous membranes are moist.  Eyes:     Extraocular Movements: Extraocular movements intact.     Pupils: Pupils are equal, round, and reactive to light.  Cardiovascular:     Rate and Rhythm: Normal rate and regular rhythm.     Heart sounds: No murmur heard. Pulmonary:     Effort: Pulmonary effort is normal.     Breath sounds: No rhonchi or rales.  Abdominal:     General: Abdomen is flat.     Palpations: There is no hepatomegaly, splenomegaly or mass.  Musculoskeletal:        General: Normal range of motion.     Cervical back: Normal range of motion. No tenderness.  Skin:    General: Skin is warm and dry.  Neurological:     General: No focal deficit present.     Mental Status: She is alert and oriented to person, place, and time.     Cranial Nerves: No cranial nerve deficit.     Motor: No weakness.  Psychiatric:        Mood and Affect: Mood  normal.        Behavior: Behavior normal.      Results for orders placed or performed in visit on 02/21/23  POCT Glucose (CBG)  Result Value Ref Range   POC Glucose 193 (A) 70 - 99 mg/dl    Recent Results (  from the past 2160 hour([place]))  POCT Glucose (CBG)     Status: Abnormal   Collection Time: 02/21/23 10:39 AM  Result Value Ref Range   POC Glucose 193 (A) 70 - 99 mg/dl      Assessment & Plan:   Problem List Items Addressed This Visit   None Visit Diagnoses     Type 2 diabetes mellitus without complication, without long-term current use of insulin (HCC)    -  Primary   Relevant Orders   POCT Glucose (CBG) (Completed)       No follow-ups on file.   Total time spent: 20 minutes  Volanda Napoleon, MD  02/21/2023

## 2023-02-24 ENCOUNTER — Other Ambulatory Visit: Payer: Self-pay | Admitting: Internal Medicine

## 2023-03-02 ENCOUNTER — Other Ambulatory Visit: Payer: Self-pay | Admitting: Internal Medicine

## 2023-03-05 ENCOUNTER — Other Ambulatory Visit: Payer: Self-pay | Admitting: Internal Medicine

## 2023-03-21 ENCOUNTER — Other Ambulatory Visit: Payer: Self-pay | Admitting: Internal Medicine

## 2023-03-24 ENCOUNTER — Emergency Department: Payer: 59

## 2023-03-24 ENCOUNTER — Other Ambulatory Visit: Payer: Self-pay | Admitting: Internal Medicine

## 2023-03-24 ENCOUNTER — Emergency Department
Admission: EM | Admit: 2023-03-24 | Discharge: 2023-03-24 | Disposition: A | Discharge status: Home / Self Care | Payer: 59 | Attending: Emergency Medicine | Admitting: Emergency Medicine

## 2023-03-24 DIAGNOSIS — S92504A Nondisplaced unspecified fracture of right lesser toe(s), initial encounter for closed fracture: Secondary | ICD-10-CM

## 2023-03-24 DIAGNOSIS — E119 Type 2 diabetes mellitus without complications: Secondary | ICD-10-CM | POA: Insufficient documentation

## 2023-03-24 DIAGNOSIS — X58XXXA Exposure to other specified factors, initial encounter: Secondary | ICD-10-CM | POA: Diagnosis not present

## 2023-03-24 DIAGNOSIS — S9031XA Contusion of right foot, initial encounter: Secondary | ICD-10-CM | POA: Insufficient documentation

## 2023-03-24 DIAGNOSIS — S92514A Nondisplaced fracture of proximal phalanx of right lesser toe(s), initial encounter for closed fracture: Secondary | ICD-10-CM | POA: Diagnosis not present

## 2023-03-24 DIAGNOSIS — Y9301 Activity, walking, marching and hiking: Secondary | ICD-10-CM | POA: Insufficient documentation

## 2023-03-24 DIAGNOSIS — Y92000 Kitchen of unspecified non-institutional (private) residence as  the place of occurrence of the external cause: Secondary | ICD-10-CM | POA: Diagnosis not present

## 2023-03-24 DIAGNOSIS — M7989 Other specified soft tissue disorders: Secondary | ICD-10-CM | POA: Diagnosis not present

## 2023-03-24 DIAGNOSIS — S99921A Unspecified injury of right foot, initial encounter: Secondary | ICD-10-CM | POA: Diagnosis present

## 2023-03-24 DIAGNOSIS — I10 Essential (primary) hypertension: Secondary | ICD-10-CM | POA: Insufficient documentation

## 2023-03-24 MED ORDER — OXYCODONE HCL 5 MG PO TABS
5.0000 mg | ORAL_TABLET | Freq: Once | ORAL | Status: AC
  Administered 2023-03-24: 5 mg via ORAL
  Filled 2023-03-24: qty 1

## 2023-03-24 NOTE — ED Notes (Signed)
Pt verbalizes understanding of discharge instructions. Opportunity for questioning and answers were provided. Pt discharged from ED to home with son.    

## 2023-03-24 NOTE — ED Provider Notes (Signed)
[facility] Provider Note    Event Date/Time   First MD Initiated Contact with Patient 03/24/23 1616     (approximate)   History   Foot Pain   HPI  Lindsey [name] is a 62 y.o. female with history of hypertension, PE, type 2 diabetes, and as listed in EMR presents to the emergency department for treatment and evaluation of right foot pain.  2 days ago, she was walking in her kitchen in the dark and stubbed her toes.  Foot has been increasingly painful and swollen.  She is having great difficulty bearing any weight on it.  She has taken her prescribed oxycodone without significant relief..      Physical Exam   Triage Vital Signs: ED Triage Vitals [03/24/23 1548]  Enc Vitals Group     BP (!) 137/118     Pulse Rate 92     Resp 18     Temp 98.3 F (36.8 C)     Temp Source Oral     SpO2 100 %     Weight 268 lb 4.8 oz (121.7 kg)     Height      Head Circumference      Peak Flow      Pain Score 9     Pain Loc      Pain Edu?      Excl. in GC?     Most recent vital signs: Vitals:   03/24/23 1548 03/24/23 1600  BP: (!) 137/118 (!) 155/100  Pulse: 92 90  Resp: 18 18  Temp: 98.3 F (36.8 C)   SpO2: 100% 100%    General: Awake, no distress.  CV:  Good peripheral perfusion.  Resp:  Normal effort.  Abd:  No distention.  Other:  Second through fifth toes painful.  Foot is swollen with nonpitting edema.  Ecchymosis noted at the base of the second third and fourth toe of the right foot   ED Results / Procedures / Treatments   Labs (all labs ordered are listed, but only abnormal results are displayed) Labs Reviewed - No data to display   EKG  Not indicated.   RADIOLOGY  Image and radiology report reviewed and interpreted by me. Radiology report consistent with the same.  Possible nondisplaced fracture right fourth toe.  PROCEDURES:  Critical Care performed: No  Procedures   MEDICATIONS ORDERED IN ED:  Medications   oxyCODONE (Oxy IR/ROXICODONE) immediate release tablet 5 mg (5 mg Oral Given 03/24/23 1650)     IMPRESSION / MDM / ASSESSMENT AND PLAN / ED COURSE   I have reviewed the triage note.  Differential diagnosis includes, but is not limited to, foot contusion, metatarsal fracture, fracture of phalange   Patient's presentation is most consistent with acute complicated illness / injury requiring diagnostic workup. 62 year old female presenting to the emergency department for treatment and evaluation of right foot pain after she stubbed her foot while walking through her house 2 nights ago.  See HPI for further details.  On exam her right foot is mildly swollen and she has ecchymosis around the base of the second and third toes.  No open wounds noted.  Plan will be to have her continue taking her chronic pain medication and place her in a cam walker boot.  She is to follow-up with her primary care provider for symptoms that are not improving over the next couple weeks.      FINAL CLINICAL IMPRESSION(S) / ED DIAGNOSES   Final  diagnoses:  Contusion of right foot, initial encounter  Closed nondisplaced fracture of phalanx of lesser toe of right foot, unspecified phalanx, initial encounter     Rx / DC Orders   ED Discharge Orders     None        Note:  This document was prepared using Dragon voice recognition software and may include unintentional dictation errors.   Chinita Pester, FNP 03/24/23 1659    Minna Antis, MD 03/24/23 1924

## 2023-03-24 NOTE — ED Triage Notes (Signed)
Pt presents to the ED due to R foot pain. Pt states she jabbed her four toes and now having radiating pain up her leg. Pt states she is having difficulty walking. Pt A&Ox4

## 2023-03-28 ENCOUNTER — Telehealth: Payer: Self-pay

## 2023-03-28 NOTE — Telephone Encounter (Signed)
Transition Care Management Follow-up Telephone Call Date of discharge and from where: 03/24/2023, [facility] How have you been since you were released from the hospital? Patient is feeling better but is still having some pain. Any questions or concerns? Yes  Items Reviewed: Did the pt receive and understand the discharge instructions provided? Yes  Medications obtained and verified? Yes  Other? No  Any new allergies since your discharge? No  Dietary orders reviewed? Yes Do you have support at home?  Patient does not need assistance at this time.  Follow up appointments reviewed:  PCP Hospital f/u appt confirmed? Yes  Scheduled to see Sherron Monday, MD on 04/11/2023 @ [facility]. Specialist Hospital f/u appt confirmed? No  Scheduled to see  on  @ . Are transportation arrangements needed? No  If their condition worsens, is the pt aware to call PCP or go to the Emergency Dept.? Yes Was the patient provided with contact information for the PCP's office or ED? Yes Was to pt encouraged to call back with questions or concerns? Yes  [name]  [facility] Population Health Community Resource Care Guide   ??[name]  ?? [phone]   Website: [email or web address]  [email or web address]

## 2023-04-02 ENCOUNTER — Other Ambulatory Visit: Payer: Self-pay | Admitting: Internal Medicine

## 2023-04-02 ENCOUNTER — Telehealth: Payer: Self-pay

## 2023-04-02 NOTE — Telephone Encounter (Signed)
Patient LM asking for call back 

## 2023-04-06 ENCOUNTER — Other Ambulatory Visit: Payer: Self-pay | Admitting: Nurse Practitioner

## 2023-04-06 NOTE — Telephone Encounter (Signed)
Patient called in regarding needing her Xanax sent to [facility] on [address]

## 2023-04-09 ENCOUNTER — Other Ambulatory Visit: Payer: Self-pay | Admitting: Internal Medicine

## 2023-04-11 ENCOUNTER — Encounter: Payer: Self-pay | Admitting: Internal Medicine

## 2023-04-11 ENCOUNTER — Ambulatory Visit (INDEPENDENT_AMBULATORY_CARE_PROVIDER_SITE_OTHER): Payer: 59 | Admitting: Internal Medicine

## 2023-04-11 VITALS — BP 124/72 | HR 116 | Ht 67.0 in | Wt 251.0 lb

## 2023-04-11 DIAGNOSIS — I639 Cerebral infarction, unspecified: Secondary | ICD-10-CM

## 2023-04-11 DIAGNOSIS — G8929 Other chronic pain: Secondary | ICD-10-CM | POA: Diagnosis not present

## 2023-04-11 DIAGNOSIS — E1122 Type 2 diabetes mellitus with diabetic chronic kidney disease: Secondary | ICD-10-CM

## 2023-04-11 DIAGNOSIS — I2609 Other pulmonary embolism with acute cor pulmonale: Secondary | ICD-10-CM | POA: Diagnosis not present

## 2023-04-11 DIAGNOSIS — Z794 Long term (current) use of insulin: Secondary | ICD-10-CM

## 2023-04-11 DIAGNOSIS — J209 Acute bronchitis, unspecified: Secondary | ICD-10-CM

## 2023-04-11 DIAGNOSIS — N1831 Chronic kidney disease, stage 3a: Secondary | ICD-10-CM

## 2023-04-11 DIAGNOSIS — N951 Menopausal and female climacteric states: Secondary | ICD-10-CM

## 2023-04-11 DIAGNOSIS — I2782 Chronic pulmonary embolism: Secondary | ICD-10-CM

## 2023-04-11 DIAGNOSIS — I10 Essential (primary) hypertension: Secondary | ICD-10-CM

## 2023-04-11 MED ORDER — METHYLPREDNISOLONE 4 MG PO TBPK
ORAL_TABLET | ORAL | 0 refills | Status: DC
Start: 2023-04-11 — End: 2023-05-11

## 2023-04-11 MED ORDER — BENZONATATE 100 MG PO CAPS
100.0000 mg | ORAL_CAPSULE | Freq: Three times a day (TID) | ORAL | 0 refills | Status: AC | PRN
Start: 2023-04-11 — End: 2023-04-21

## 2023-04-11 MED ORDER — ATORVASTATIN CALCIUM 40 MG PO TABS: 40.0000 mg | ORAL_TABLET | Freq: Every evening | ORAL | 0 refills | Status: DC

## 2023-04-11 MED ORDER — OXYCODONE HCL 20 MG PO TABS
1.0000 | ORAL_TABLET | Freq: Four times a day (QID) | ORAL | 0 refills | Status: DC
Start: 2023-04-11 — End: 2023-04-27

## 2023-04-11 MED ORDER — CLONIDINE HCL 0.1 MG PO TABS
ORAL_TABLET | ORAL | 3 refills | Status: DC
Start: 2023-04-11 — End: 2023-05-23

## 2023-04-11 NOTE — Progress Notes (Signed)
Established Patient Office Visit  Subjective:  Patient ID: Lindsey [name], female    DOB: 08/27/1961  Age: 62 y.o. MRN: [id number]  Chief Complaint  Patient presents with   Follow-up    PM    Here for pain management follow up. Chronic pain well controlled on current analgesia. Last UDS satisfactory and pill counts have also been satisfactory. C/o persistent cough following a recent uri. Cough productive of whitish sputum.    No other concerns at this time.   Past Medical History:  Diagnosis Date   Allergy    Anemia    Arthritis    knees   CHF (congestive heart failure) (HCC)    COPD (chronic obstructive pulmonary disease) (HCC)    Diabetes mellitus without complication (HCC)    diet controlled   GERD (gastroesophageal reflux disease)    Heart attack (HCC) 2002   Hyperlipidemia    Hypertension    PUD (peptic ulcer disease)    Sleep apnea    can't tolerate CPAP.  Uses O2 only   Spinal cord stimulator status    for lower back pain   Stroke [facility]) 2004   "mini-stroke"   Vertigo    Wears dentures    full upper and lower    Past Surgical History:  Procedure Laterality Date   ABDOMINAL HYSTERECTOMY     back surgery     broken wrist     COLONOSCOPY WITH PROPOFOL N/A 01/30/2017   Procedure: COLONOSCOPY WITH PROPOFOL;  Surgeon: Wyline Mood, MD;  Location: [facility] ENDOSCOPY;  Service: Endoscopy;  Laterality: N/A;   COLONOSCOPY WITH PROPOFOL N/A 02/20/2017   Procedure: Colonoscopy with propofol ;  Surgeon: Wyline Mood, MD;  Location: [facility] ENDOSCOPY;  Service: Endoscopy;  Laterality: N/A;   COLONOSCOPY WITH PROPOFOL N/A 03/13/2017   Procedure: COLONOSCOPY WITH PROPOFOL;  Surgeon: Wyline Mood, MD;  Location: [facility] ENDOSCOPY;  Service: Endoscopy;  Laterality: N/A;   COLONOSCOPY WITH PROPOFOL N/A 04/17/2017   Procedure: COLONOSCOPY WITH PROPOFOL;  Surgeon: Wyline Mood, MD;  Location: [facility] ENDOSCOPY;  Service: Endoscopy;  Laterality: N/A;   ESOPHAGOGASTRODUODENOSCOPY N/A 05/05/2020    Procedure: ESOPHAGOGASTRODUODENOSCOPY (EGD);  Surgeon: Toledo, Boykin Nearing, MD;  Location: [facility] ENDOSCOPY;  Service: Gastroenterology;  Laterality: N/A;   ESOPHAGOGASTRODUODENOSCOPY (EGD) WITH PROPOFOL N/A 09/28/2016   Procedure: ESOPHAGOGASTRODUODENOSCOPY (EGD) WITH PROPOFOL;  Surgeon: Scot Jun, MD;  Location: [facility] ENDOSCOPY;  Service: Endoscopy;  Laterality: N/A;   ESOPHAGOGASTRODUODENOSCOPY (EGD) WITH PROPOFOL N/A 03/13/2017   Procedure: ESOPHAGOGASTRODUODENOSCOPY (EGD) WITH PROPOFOL;  Surgeon: Wyline Mood, MD;  Location: [facility] ENDOSCOPY;  Service: Endoscopy;  Laterality: N/A;   GALLBLADDER SURGERY     GASTRIC BYPASS     GIVENS CAPSULE STUDY N/A 03/13/2017   Procedure: GIVENS CAPSULE STUDY;  Surgeon: Wyline Mood, MD;  Location: [facility] ENDOSCOPY;  Service: Endoscopy;  Laterality: N/A;   HERNIA REPAIR     JOINT REPLACEMENT  1995   REPLACEMENT TOTAL KNEE     ROTATOR CUFF REPAIR      Social History   Socioeconomic History   Marital status: Widowed    Spouse name: Not on file   Number of children: Not on file   Years of education: Not on file   Highest education level: Not on file  Occupational History   Occupation: disabled  Tobacco Use   Smoking status: Never   Smokeless tobacco: Never  Vaping Use   Vaping Use: Never used  Substance and Sexual Activity   Alcohol use: No    Alcohol/week: 0.0  standard drinks of alcohol   Drug use: No   Sexual activity: Yes  Other Topics Concern   Not on file  Social History Narrative   Not on file   Social Determinants of Health   Financial Resource Strain: Not on file  Food Insecurity: Not on file  Transportation Needs: Not on file  Physical Activity: Not on file  Stress: Not on file  Social Connections: Not on file  Intimate Partner Violence: Not on file    Family History  Problem Relation Age of Onset   Hypertension Father    COPD Father    Heart disease Father    Breast cancer Neg Hx     Allergies  Allergen Reactions    Morphine Hives   Iodinated Contrast Media Hives   Aspirin Hives    Noted on MD progress notes and discussed with MD 09/02/15   Etodolac Hives   Ibuprofen Hives   Shellfish Allergy Hives   Tylenol [Acetaminophen] Hives    Upset stomach     Review of Systems  Constitutional: Negative.   HENT: Negative.    Eyes: Negative.   Respiratory:  Positive for cough and wheezing.   Cardiovascular: Negative.   Gastrointestinal: Negative.   Genitourinary: Negative.   Skin: Negative.   Neurological: Negative.   Endo/Heme/Allergies: Negative.        Objective:   BP 124/72   Pulse (!) 116   Ht 5\' 7"  (1.702 m)   Wt 251 lb (113.9 kg)   SpO2 96%   BMI 39.31 kg/m   Vitals:   04/11/23 0925  BP: 124/72  Pulse: (!) 116  Height: 5\' 7"  (1.702 m)  Weight: 251 lb (113.9 kg)  SpO2: 96%  BMI (Calculated): 39.3    Physical Exam Vitals reviewed.  Constitutional:      General: She is not in acute distress.    Appearance: She is obese.  HENT:     Head: Normocephalic.     Nose: Nose normal.     Mouth/Throat:     Mouth: Mucous membranes are moist.  Eyes:     Extraocular Movements: Extraocular movements intact.     Pupils: Pupils are equal, round, and reactive to light.  Cardiovascular:     Rate and Rhythm: Normal rate and regular rhythm.     Heart sounds: No murmur heard. Pulmonary:     Effort: Pulmonary effort is normal.     Breath sounds: Rhonchi present. No rales.  Abdominal:     General: Abdomen is flat.     Palpations: There is no hepatomegaly, splenomegaly or mass.  Musculoskeletal:        General: Normal range of motion.     Cervical back: Normal range of motion. No tenderness.  Skin:    General: Skin is warm and dry.  Neurological:     General: No focal deficit present.     Mental Status: She is alert and oriented to person, place, and time.     Cranial Nerves: No cranial nerve deficit.     Motor: No weakness.  Psychiatric:        Mood and Affect: Mood normal.         Behavior: Behavior normal.      No results found for any visits on 04/11/23.  Recent Results (from the past 2160 hour(Korinne Greenstein))  POCT Glucose (CBG)     Status: Abnormal   Collection Time: 02/21/23 10:39 AM  Result Value Ref Range   POC Glucose 193 (A)  70 - 99 mg/dl      Assessment & Plan:   Problem List Items Addressed This Visit       Cardiovascular and Mediastinum   Pulmonary embolism (HCC)   Relevant Medications   cloNIDine (CATAPRES) 0.1 MG tablet   atorvastatin (LIPITOR) 40 MG tablet   HTN (hypertension)   Relevant Medications   cloNIDine (CATAPRES) 0.1 MG tablet   atorvastatin (LIPITOR) 40 MG tablet   Stroke (HCC)   Relevant Medications   cloNIDine (CATAPRES) 0.1 MG tablet   atorvastatin (LIPITOR) 40 MG tablet     Endocrine   Type II diabetes mellitus with renal manifestations (HCC) - Primary   Relevant Medications   atorvastatin (LIPITOR) 40 MG tablet     Other   Other chronic pain   Relevant Medications   Oxycodone HCl 20 MG TABS   methylPREDNISolone (MEDROL DOSEPAK) 4 MG TBPK tablet   Other Visit Diagnoses     Acute bronchitis, unspecified organism       Relevant Medications   methylPREDNISolone (MEDROL DOSEPAK) 4 MG TBPK tablet   Hot flashes due to menopause       Relevant Medications   cloNIDine (CATAPRES) 0.1 MG tablet   atorvastatin (LIPITOR) 40 MG tablet       Return in about 2 weeks (around 04/25/2023) for lab results.   Total time spent: 30 minutes  Luna Fuse, MD  04/11/2023

## 2023-04-11 NOTE — Addendum Note (Signed)
Addended by: Aundria Mems AHMAD on: 04/11/2023 12:37 PM   Modules accepted: Orders

## 2023-04-12 ENCOUNTER — Other Ambulatory Visit: Payer: Self-pay | Admitting: Internal Medicine

## 2023-04-17 ENCOUNTER — Other Ambulatory Visit: Payer: Self-pay | Admitting: Internal Medicine

## 2023-04-19 ENCOUNTER — Telehealth: Payer: Self-pay | Admitting: Internal Medicine

## 2023-04-19 NOTE — Telephone Encounter (Signed)
Needs letter stating that she is still on Eliquis 5 mg BID because she had Covid late last year. Says her insurance needs the letter and they are trying to cancel her insurance.

## 2023-04-20 ENCOUNTER — Other Ambulatory Visit: Payer: Self-pay | Admitting: Internal Medicine

## 2023-04-24 ENCOUNTER — Telehealth: Payer: Self-pay

## 2023-04-24 NOTE — Telephone Encounter (Signed)
Patient is requesting call back about medications, letter is not finished yet but will notify once completed

## 2023-04-27 ENCOUNTER — Other Ambulatory Visit: Payer: Self-pay | Admitting: Internal Medicine

## 2023-04-27 ENCOUNTER — Telehealth: Payer: Self-pay | Admitting: Internal Medicine

## 2023-04-27 DIAGNOSIS — G8929 Other chronic pain: Secondary | ICD-10-CM

## 2023-04-27 MED ORDER — OXYCODONE HCL 20 MG PO TABS
1.0000 | ORAL_TABLET | Freq: Four times a day (QID) | ORAL | 0 refills | Status: DC
Start: 2023-04-27 — End: 2023-05-11

## 2023-04-27 NOTE — Telephone Encounter (Signed)
Patient needs rx for 15 days called into the [facility] in [place] states she never was able to get the rx from the [facility] [address] [facility] [address] [facility]  since they closing

## 2023-04-27 NOTE — Telephone Encounter (Signed)
Patient called once again about her oxycodone needs to be sent to [facility] because the one on [address] is now closed and didn't have it. Please send today as she has called multiple times a day all week.

## 2023-04-30 ENCOUNTER — Other Ambulatory Visit: Payer: Self-pay

## 2023-04-30 MED ORDER — BELSOMRA 20 MG PO TABS: 1.0000 | ORAL_TABLET | Freq: Every day | ORAL | 3 refills | Status: DC

## 2023-04-30 MED ORDER — ALPRAZOLAM 0.25 MG PO TABS: 0.2500 mg | ORAL_TABLET | Freq: Three times a day (TID) | ORAL | 3 refills | Status: DC

## 2023-05-02 ENCOUNTER — Telehealth: Payer: Self-pay

## 2023-05-03 ENCOUNTER — Telehealth: Payer: Self-pay

## 2023-05-03 ENCOUNTER — Telehealth: Payer: Self-pay | Admitting: Pharmacist

## 2023-05-03 NOTE — Progress Notes (Unsigned)
[facility], [facility] Team Medication Adherence Quality Measures  05/03/2023  Lindsey [name] 14-Sep-1961  Provider:  Sherron Monday, MD  Practice:  [facility]  Outreach: Unsuccessful call attempt number 3.  Medication Adherence Measure(s):   Medication Adherence for Diabetes Medication  Medication Adherence Findings: Patient is late to fill Farxiga 10 mg. Refill is past due as of 01/31/23 and absolute fail date is 06/18/23.  Plan: 3 call attempts and left message to PCP.   Thank you for allowing [facility] to a part of this patient's care.  [name], PharmD Candidate [employer] Class of [phone]

## 2023-05-03 NOTE — Progress Notes (Signed)
[facility], [facility] Team Medication Adherence Quality Measures  05/03/2023  Lindsey [name] 03/10/1961  Provider:  Sherron Monday, MD  Practice:  [facility]   Outreach: Successful telephone call, HIPAA identifiers verified.  Medication Adherence Measure(s):   Medication Adherence for Diabetes Medication  Medication Adherence Findings: Patient is late to fill Farxiga 10 mg. Refill is past due as of 01/31/2023 and absolute fail date is 07/22/204. Placed telephone call to patient today to discuss late fill as well as the discovery that there was a prescription filled and marked as picked up on 04/25/2023 for Tresiba flexpen. This is not an active medication on her medication list.  Patient requested that I call her back later today as she was driving. I will have [name], student PharmD attempt to reach patient again this afternoon to discuss the [name] fill.   Plan: Will attempt to reach patient again later today.   Thank you for allowing [facility] to a part of this patient's care.   Reynold Bowen, PharmD [facility]  [facility] Clinical Pharmacist Direct Dial: [phone]

## 2023-05-03 NOTE — Progress Notes (Signed)
[facility], sports Team Medication Adherence Quality Measures  05/03/2023  Lindsey [name] 1961-11-19  Provider:  Sherron Monday, MD  Practice:  [facility]  Outreach: HIPAA compliant voicemail left requesting a return call.  Medication Adherence Measure(s):   Medication Adherence for Diabetes Medication  Medication Adherence Findings: Patient is late to fill Farxiga 10mg . Refill is past due as of 01/31/23 and absolute fail date is 06/18/23.   Plan: Will follow up with patient and/or provider in 3 business days.  Thank you for allowing [facility] to a part of this patient's care.   [name], PharmD Candidate [facility] Class of 1610 [phone]

## 2023-05-07 ENCOUNTER — Telehealth: Payer: Self-pay | Admitting: Internal Medicine

## 2023-05-07 NOTE — Telephone Encounter (Signed)
Patient called in stating that the letter we provided her with for her insurance company about her Eliquis was wrong. It stated she was on Eliquis because she had a PE in the past and she thought she was on it because she had Covid twice. I advised her that she was on this because of the PE in the past and that the letter was correct. Patient verbalized understanding.

## 2023-05-10 ENCOUNTER — Other Ambulatory Visit: Payer: 59

## 2023-05-10 DIAGNOSIS — E119 Type 2 diabetes mellitus without complications: Secondary | ICD-10-CM | POA: Diagnosis not present

## 2023-05-10 DIAGNOSIS — K922 Gastrointestinal hemorrhage, unspecified: Secondary | ICD-10-CM

## 2023-05-11 ENCOUNTER — Ambulatory Visit (INDEPENDENT_AMBULATORY_CARE_PROVIDER_SITE_OTHER): Payer: 59 | Admitting: Internal Medicine

## 2023-05-11 VITALS — BP 148/70 | HR 103 | Ht 67.0 in | Wt 253.0 lb

## 2023-05-11 DIAGNOSIS — G8929 Other chronic pain: Secondary | ICD-10-CM

## 2023-05-11 DIAGNOSIS — E1122 Type 2 diabetes mellitus with diabetic chronic kidney disease: Secondary | ICD-10-CM | POA: Diagnosis not present

## 2023-05-11 DIAGNOSIS — N1831 Chronic kidney disease, stage 3a: Secondary | ICD-10-CM

## 2023-05-11 DIAGNOSIS — Z794 Long term (current) use of insulin: Secondary | ICD-10-CM | POA: Diagnosis not present

## 2023-05-11 DIAGNOSIS — E669 Obesity, unspecified: Secondary | ICD-10-CM | POA: Diagnosis not present

## 2023-05-11 LAB — COMPREHENSIVE METABOLIC PANEL
ALT: 15 IU/L (ref 0–32)
AST: 18 IU/L (ref 0–40)
Albumin/Globulin Ratio: 1.3
Albumin: 3.7 g/dL — ABNORMAL LOW (ref 3.9–4.9)
Alkaline Phosphatase: 181 IU/L — ABNORMAL HIGH (ref 44–121)
BUN/Creatinine Ratio: 10 — ABNORMAL LOW (ref 12–28)
BUN: 14 mg/dL (ref 8–27)
Bilirubin Total: 0.4 mg/dL (ref 0.0–1.2)
CO2: 19 mmol/L — ABNORMAL LOW (ref 20–29)
Calcium: 9.4 mg/dL (ref 8.7–10.3)
Chloride: 107 mmol/L — ABNORMAL HIGH (ref 96–106)
Creatinine, Ser: 1.38 mg/dL — ABNORMAL HIGH (ref 0.57–1.00)
Globulin, Total: 2.9 g/dL (ref 1.5–4.5)
Glucose: 206 mg/dL — ABNORMAL HIGH (ref 70–99)
Potassium: 4.6 mmol/L (ref 3.5–5.2)
Sodium: 139 mmol/L (ref 134–144)
Total Protein: 6.6 g/dL (ref 6.0–8.5)
eGFR: 43 mL/min/{1.73_m2} — ABNORMAL LOW (ref >59)

## 2023-05-11 LAB — LIPID PANEL
Chol/HDL Ratio: 2.2 ratio (ref 0.0–4.4)
Cholesterol, Total: 156 mg/dL (ref 100–199)
HDL: 70 mg/dL (ref >39)
LDL Chol Calc (NIH): 66 mg/dL (ref 0–99)
Triglycerides: 112 mg/dL (ref 0–149)
VLDL Cholesterol Cal: 20 mg/dL (ref 5–40)

## 2023-05-11 LAB — HEMOGLOBIN A1C
Est. average glucose Bld gHb Est-mCnc: 163 mg/dL
Hgb A1c MFr Bld: 7.3 % — ABNORMAL HIGH (ref 4.8–5.6)

## 2023-05-11 MED ORDER — OXYCODONE HCL 20 MG PO TABS
1.0000 | ORAL_TABLET | Freq: Four times a day (QID) | ORAL | 0 refills | Status: DC
Start: 2023-05-11 — End: 2023-06-10

## 2023-05-11 MED ORDER — MOUNJARO 10 MG/0.5ML ~~LOC~~ SOAJ
10.0000 mg | SUBCUTANEOUS | 2 refills | Status: DC
Start: 2023-05-11 — End: 2023-06-11

## 2023-05-11 NOTE — Progress Notes (Signed)
Established Patient Office Visit  Subjective:  Patient ID: Lindsey [name], female    DOB: [birth year]  Age: 62 y.o. MRN: [id number]  Chief Complaint  Patient presents with   Follow-up    PM    Here for pain management and diabetes follow up. Chronic pain well controlled on current analgesia. Last UDS satisfactory and pill counts have also been satisfactory. Cough has improved. Diabetes still uncontrolled on lab review. LDL and TC well controlled on lab review. Triglycerides also satisfactory.    No other concerns at this time.   Past Medical History:  Diagnosis Date   Allergy    Anemia    Arthritis    knees   CHF (congestive heart failure) (HCC)    COPD (chronic obstructive pulmonary disease) (HCC)    Diabetes mellitus without complication (HCC)    diet controlled   GERD (gastroesophageal reflux disease)    Heart attack (HCC) 2002   Hyperlipidemia    Hypertension    PUD (peptic ulcer disease)    Sleep apnea    can't tolerate CPAP.  Uses O2 only   Spinal cord stimulator status    for lower back pain   Stroke [facility]) 2004   "mini-stroke"   Vertigo    Wears dentures    full upper and lower    Past Surgical History:  Procedure Laterality Date   ABDOMINAL HYSTERECTOMY     back surgery     broken wrist     COLONOSCOPY WITH PROPOFOL N/A 01/30/2017   Procedure: COLONOSCOPY WITH PROPOFOL;  Surgeon: Wyline Mood, MD;  Location: [facility] ENDOSCOPY;  Service: Endoscopy;  Laterality: N/A;   COLONOSCOPY WITH PROPOFOL N/A 02/20/2017   Procedure: Colonoscopy with propofol ;  Surgeon: Wyline Mood, MD;  Location: [facility] ENDOSCOPY;  Service: Endoscopy;  Laterality: N/A;   COLONOSCOPY WITH PROPOFOL N/A 03/13/2017   Procedure: COLONOSCOPY WITH PROPOFOL;  Surgeon: Wyline Mood, MD;  Location: [facility] ENDOSCOPY;  Service: Endoscopy;  Laterality: N/A;   COLONOSCOPY WITH PROPOFOL N/A 04/17/2017   Procedure: COLONOSCOPY WITH PROPOFOL;  Surgeon: Wyline Mood, MD;  Location: [facility] ENDOSCOPY;  Service:  Endoscopy;  Laterality: N/A;   ESOPHAGOGASTRODUODENOSCOPY N/A 05/05/2020   Procedure: ESOPHAGOGASTRODUODENOSCOPY (EGD);  Surgeon: Toledo, Boykin Nearing, MD;  Location: [facility] ENDOSCOPY;  Service: Gastroenterology;  Laterality: N/A;   ESOPHAGOGASTRODUODENOSCOPY (EGD) WITH PROPOFOL N/A 09/28/2016   Procedure: ESOPHAGOGASTRODUODENOSCOPY (EGD) WITH PROPOFOL;  Surgeon: Scot Jun, MD;  Location: [facility] ENDOSCOPY;  Service: Endoscopy;  Laterality: N/A;   ESOPHAGOGASTRODUODENOSCOPY (EGD) WITH PROPOFOL N/A 03/13/2017   Procedure: ESOPHAGOGASTRODUODENOSCOPY (EGD) WITH PROPOFOL;  Surgeon: Wyline Mood, MD;  Location: [facility] ENDOSCOPY;  Service: Endoscopy;  Laterality: N/A;   GALLBLADDER SURGERY     GASTRIC BYPASS     GIVENS CAPSULE STUDY N/A 03/13/2017   Procedure: GIVENS CAPSULE STUDY;  Surgeon: Wyline Mood, MD;  Location: [facility] ENDOSCOPY;  Service: Endoscopy;  Laterality: N/A;   HERNIA REPAIR     JOINT REPLACEMENT  1995   REPLACEMENT TOTAL KNEE     ROTATOR CUFF REPAIR      Social History   Socioeconomic History   Marital status: Widowed    Spouse name: Not on file   Number of children: Not on file   Years of education: Not on file   Highest education level: Not on file  Occupational History   Occupation: disabled  Tobacco Use   Smoking status: Never   Smokeless tobacco: Never  Vaping Use   Vaping Use: Never used  Substance and Sexual Activity  Alcohol use: No    Alcohol/week: 0.0 standard drinks of alcohol   Drug use: No   Sexual activity: Yes  Other Topics Concern   Not on file  Social History Narrative   Not on file   Social Determinants of Health   Financial Resource Strain: Not on file  Food Insecurity: Not on file  Transportation Needs: Not on file  Physical Activity: Not on file  Stress: Not on file  Social Connections: Not on file  Intimate Partner Violence: Not on file    Family History  Problem Relation Age of Onset   Hypertension Father    COPD Father    Heart disease  Father    Breast cancer Neg Hx     Allergies  Allergen Reactions   Morphine Hives   Iodinated Contrast Media Hives   Aspirin Hives    Noted on MD progress notes and discussed with MD 09/02/15   Etodolac Hives   Ibuprofen Hives   Shellfish Allergy Hives   Tylenol [Acetaminophen] Hives    Upset stomach     Review of Systems  Constitutional:  Positive for weight loss.       11 lbs since 11/2022  HENT: Negative.    Eyes: Negative.   Respiratory:  Positive for cough.   Cardiovascular: Negative.   Gastrointestinal: Negative.   Genitourinary: Negative.   Skin: Negative.   Neurological: Negative.   Endo/Heme/Allergies: Negative.        Objective:   BP (!) 148/70   Pulse (!) 103   Ht 5\' 7"  (1.702 m)   Wt 253 lb (114.8 kg)   SpO2 98%   BMI 39.63 kg/m   Vitals:   05/11/23 1012  BP: (!) 148/70  Pulse: (!) 103  Height: 5\' 7"  (1.702 m)  Weight: 253 lb (114.8 kg)  SpO2: 98%  BMI (Calculated): 39.62    Physical Exam Vitals reviewed.  Constitutional:      General: She is not in acute distress.    Appearance: She is obese.  HENT:     Head: Normocephalic.     Nose: Nose normal.     Mouth/Throat:     Mouth: Mucous membranes are moist.  Eyes:     Extraocular Movements: Extraocular movements intact.     Pupils: Pupils are equal, round, and reactive to light.  Cardiovascular:     Rate and Rhythm: Normal rate and regular rhythm.     Heart sounds: No murmur heard. Pulmonary:     Effort: Pulmonary effort is normal.     Breath sounds: No rhonchi or rales.  Abdominal:     General: Abdomen is flat.     Palpations: There is no hepatomegaly, splenomegaly or mass.  Musculoskeletal:        General: Normal range of motion.     Cervical back: Normal range of motion. No tenderness.  Skin:    General: Skin is warm and dry.  Neurological:     General: No focal deficit present.     Mental Status: She is alert and oriented to person, place, and time.     Cranial Nerves: No  cranial nerve deficit.     Motor: No weakness.  Psychiatric:        Mood and Affect: Mood normal.        Behavior: Behavior normal.      No results found for any visits on 05/11/23.  Recent Results (from the past 2160 hour(Haeli Gerlich))  POCT Glucose (CBG)  Status: Abnormal   Collection Time: 02/21/23 10:39 AM  Result Value Ref Range   POC Glucose 193 (A) 70 - 99 mg/dl  Lipid panel     Status: None   Collection Time: 05/10/23 11:53 AM  Result Value Ref Range   Cholesterol, Total 156 100 - 199 mg/dL   Triglycerides 161 0 - 149 mg/dL   HDL 70 >09 mg/dL   VLDL Cholesterol Cal 20 5 - 40 mg/dL   LDL Chol Calc (NIH) 66 0 - 99 mg/dL   Chol/HDL Ratio 2.2 0.0 - 4.4 ratio    Comment:                                   T. Chol/HDL Ratio                                             Men  Women                               1/2 Avg.Risk  3.4    3.3                                   Avg.Risk  5.0    4.4                                2X Avg.Risk  9.6    7.1                                3X Avg.Risk 23.4   11.0   Comprehensive metabolic panel     Status: Abnormal   Collection Time: 05/10/23 11:53 AM  Result Value Ref Range   Glucose 206 (H) 70 - 99 mg/dL   BUN 14 8 - 27 mg/dL   Creatinine, Ser 6.04 (H) 0.57 - 1.00 mg/dL   eGFR 43 (L) >54 UJ/WJX/9.14   BUN/Creatinine Ratio 10 (L) 12 - 28   Sodium 139 134 - 144 mmol/L   Potassium 4.6 3.5 - 5.2 mmol/L   Chloride 107 (H) 96 - 106 mmol/L   CO2 19 (L) 20 - 29 mmol/L   Calcium 9.4 8.7 - 10.3 mg/dL   Total Protein 6.6 6.0 - 8.5 g/dL   Albumin 3.7 (L) 3.9 - 4.9 g/dL   Globulin, Total 2.9 1.5 - 4.5 g/dL   Albumin/Globulin Ratio 1.3    Bilirubin Total 0.4 0.0 - 1.2 mg/dL   Alkaline Phosphatase 181 (H) 44 - 121 IU/L   AST 18 0 - 40 IU/L   ALT 15 0 - 32 IU/L  Hemoglobin A1c     Status: Abnormal   Collection Time: 05/10/23 11:53 AM  Result Value Ref Range   Hgb A1c MFr Bld 7.3 (H) 4.8 - 5.6 %    Comment:          Prediabetes: 5.7 - 6.4           Diabetes: >6.4          Glycemic control for adults with diabetes: <7.0    Est. average  glucose Bld gHb Est-mCnc 163 mg/dL      Assessment & Plan:   As per problem list. Increase Mounjaro.  Problem List Items Addressed This Visit       Endocrine   Type II diabetes mellitus with renal manifestations (HCC) - Primary   Relevant Medications   tirzepatide (MOUNJARO) 10 MG/0.5ML Pen     Other   Other chronic pain   Relevant Medications   Oxycodone HCl 20 MG TABS   Obesity (BMI 30-39.9)   Relevant Medications   tirzepatide (MOUNJARO) 10 MG/0.5ML Pen    Return in about 1 month (around 06/10/2023).   Total time spent: 30 minutes  Luna Fuse, MD  05/11/2023   This document may have been prepared by [facility] Voice Recognition software and as such may include unintentional dictation errors.

## 2023-05-13 ENCOUNTER — Other Ambulatory Visit: Payer: Self-pay | Admitting: Internal Medicine

## 2023-05-17 ENCOUNTER — Other Ambulatory Visit: Payer: Self-pay | Admitting: Internal Medicine

## 2023-05-18 ENCOUNTER — Other Ambulatory Visit: Payer: Self-pay

## 2023-05-20 ENCOUNTER — Other Ambulatory Visit: Payer: Self-pay | Admitting: Internal Medicine

## 2023-05-21 ENCOUNTER — Other Ambulatory Visit: Payer: Self-pay

## 2023-05-21 ENCOUNTER — Other Ambulatory Visit: Payer: Self-pay | Admitting: Internal Medicine

## 2023-05-21 ENCOUNTER — Telehealth: Payer: Self-pay

## 2023-05-21 DIAGNOSIS — I639 Cerebral infarction, unspecified: Secondary | ICD-10-CM

## 2023-05-21 MED ORDER — ESOMEPRAZOLE MAGNESIUM 40 MG PO CPDR: 40.0000 mg | DELAYED_RELEASE_CAPSULE | Freq: Two times a day (BID) | ORAL | 2 refills | Status: DC

## 2023-05-21 MED ORDER — BACLOFEN 10 MG PO TABS: 10.0000 mg | ORAL_TABLET | Freq: Three times a day (TID) | ORAL | 5 refills | Status: DC

## 2023-05-21 MED ORDER — ATORVASTATIN CALCIUM 40 MG PO TABS: 40.0000 mg | ORAL_TABLET | Freq: Every evening | ORAL | 0 refills | Status: DC

## 2023-05-21 MED ORDER — ALPRAZOLAM 0.25 MG PO TABS: 0.2500 mg | ORAL_TABLET | Freq: Three times a day (TID) | ORAL | 3 refills | Status: DC

## 2023-05-23 ENCOUNTER — Other Ambulatory Visit: Payer: Self-pay

## 2023-05-23 DIAGNOSIS — N951 Menopausal and female climacteric states: Secondary | ICD-10-CM

## 2023-05-23 MED ORDER — APIXABAN 5 MG PO TABS: 5.0000 mg | ORAL_TABLET | Freq: Two times a day (BID) | ORAL | 3 refills | Status: DC

## 2023-05-23 MED ORDER — NEXIUM 40 MG PO CPDR: 40.0000 mg | DELAYED_RELEASE_CAPSULE | Freq: Two times a day (BID) | ORAL | 3 refills | Status: DC

## 2023-05-23 MED ORDER — CLONIDINE HCL 0.1 MG PO TABS
ORAL_TABLET | ORAL | 3 refills | Status: DC
Start: 2023-05-23 — End: 2024-05-07

## 2023-05-23 MED ORDER — MIRTAZAPINE 45 MG PO TABS: 45.0000 mg | ORAL_TABLET | Freq: Every day | ORAL | 3 refills | Status: DC

## 2023-05-29 ENCOUNTER — Other Ambulatory Visit: Payer: Self-pay | Admitting: Internal Medicine

## 2023-06-04 DIAGNOSIS — R0902 Hypoxemia: Secondary | ICD-10-CM | POA: Diagnosis not present

## 2023-06-07 ENCOUNTER — Other Ambulatory Visit: Payer: Self-pay

## 2023-06-07 MED ORDER — FEXOFENADINE HCL 180 MG PO TABS: 180.0000 mg | ORAL_TABLET | Freq: Every day | ORAL | 3 refills | Status: DC

## 2023-06-07 MED ORDER — FOLIC ACID 1 MG PO TABS: 1.0000 mg | ORAL_TABLET | Freq: Every day | ORAL | 3 refills | Status: DC

## 2023-06-07 MED ORDER — MECLIZINE HCL 25 MG PO TABS: 25.0000 mg | ORAL_TABLET | Freq: Three times a day (TID) | ORAL | 1 refills | Status: DC

## 2023-06-07 MED ORDER — BELSOMRA 20 MG PO TABS: 1.0000 | ORAL_TABLET | Freq: Every day | ORAL | 3 refills | Status: DC

## 2023-06-07 MED ORDER — POLYETHYLENE GLYCOL 3350 17 G PO PACK: 17.0000 g | PACK | Freq: Every day | ORAL | Status: AC | PRN

## 2023-06-07 MED ORDER — NALOXONE HCL 4 MG/0.1ML NA LIQD: NASAL | 0 refills | Status: DC

## 2023-06-07 MED ORDER — ALBUTEROL SULFATE HFA 108 (90 BASE) MCG/ACT IN AERS: 2.0000 | INHALATION_SPRAY | RESPIRATORY_TRACT | 3 refills | Status: DC | PRN

## 2023-06-07 MED ORDER — TRESIBA FLEXTOUCH 200 UNIT/ML ~~LOC~~ SOPN: 28.0000 [IU] | PEN_INJECTOR | Freq: Every morning | SUBCUTANEOUS | 3 refills | Status: DC

## 2023-06-07 MED ORDER — BUSPIRONE HCL 7.5 MG PO TABS: 7.5000 mg | ORAL_TABLET | Freq: Two times a day (BID) | ORAL | 0 refills | Status: DC

## 2023-06-07 MED ORDER — HYDROXYZINE HCL 25 MG PO TABS: 25.0000 mg | ORAL_TABLET | Freq: Three times a day (TID) | ORAL | 3 refills | Status: DC

## 2023-06-07 MED ORDER — DICLOFENAC SODIUM 1 % EX GEL: 4.0000 g | Freq: Two times a day (BID) | CUTANEOUS | 3 refills | Status: DC

## 2023-06-07 MED ORDER — FERROUS SULFATE 325 (65 FE) MG PO TABS: 325.0000 mg | ORAL_TABLET | Freq: Two times a day (BID) | ORAL | 3 refills | Status: DC

## 2023-06-07 MED ORDER — UBRELVY 100 MG PO TABS: 100.0000 mg | ORAL_TABLET | ORAL | 3 refills | Status: DC | PRN

## 2023-06-08 ENCOUNTER — Telehealth: Payer: Self-pay | Admitting: Internal Medicine

## 2023-06-08 NOTE — Telephone Encounter (Signed)
Patient called and said she needs her Mounjaro 10 mg sent to [facility]. Please advise.

## 2023-06-11 ENCOUNTER — Ambulatory Visit (INDEPENDENT_AMBULATORY_CARE_PROVIDER_SITE_OTHER): Payer: 59 | Admitting: Internal Medicine

## 2023-06-11 VITALS — BP 144/82 | HR 133 | Ht 67.0 in | Wt 253.0 lb

## 2023-06-11 DIAGNOSIS — N1831 Chronic kidney disease, stage 3a: Secondary | ICD-10-CM | POA: Diagnosis not present

## 2023-06-11 DIAGNOSIS — I10 Essential (primary) hypertension: Secondary | ICD-10-CM

## 2023-06-11 DIAGNOSIS — E1122 Type 2 diabetes mellitus with diabetic chronic kidney disease: Secondary | ICD-10-CM | POA: Diagnosis not present

## 2023-06-11 DIAGNOSIS — G8929 Other chronic pain: Secondary | ICD-10-CM

## 2023-06-11 DIAGNOSIS — E669 Obesity, unspecified: Secondary | ICD-10-CM | POA: Diagnosis not present

## 2023-06-11 DIAGNOSIS — Z794 Long term (current) use of insulin: Secondary | ICD-10-CM | POA: Diagnosis not present

## 2023-06-11 MED ORDER — OXYCODONE HCL 20 MG PO TABS
20.0000 mg | ORAL_TABLET | Freq: Four times a day (QID) | ORAL | 0 refills | Status: DC
Start: 2023-06-11 — End: 2023-07-11

## 2023-06-11 MED ORDER — MOUNJARO 10 MG/0.5ML ~~LOC~~ SOAJ
10.0000 mg | SUBCUTANEOUS | 2 refills | Status: DC
Start: 2023-06-11 — End: 2023-09-10

## 2023-06-11 MED ORDER — POTASSIUM CHLORIDE ER 10 MEQ PO TBCR
10.0000 meq | EXTENDED_RELEASE_TABLET | Freq: Every day | ORAL | 1 refills | Status: DC
Start: 2023-06-11 — End: 2023-10-10

## 2023-06-11 NOTE — Progress Notes (Signed)
Established Patient Office Visit  Subjective:  Patient ID: Lindsey [name], female    DOB: [birth year]  Age: 62 y.o. MRN: [id number]  Chief Complaint  Patient presents with   Follow-up    PM    Here for pain management follow up. Chronic pain well controlled on current analgesia. Last UDS satisfactory and pill counts have also been satisfactory.     No other concerns at this time.   Past Medical History:  Diagnosis Date   Allergy    Anemia    Arthritis    knees   CHF (congestive heart failure) (HCC)    COPD (chronic obstructive pulmonary disease) (HCC)    Diabetes mellitus without complication (HCC)    diet controlled   GERD (gastroesophageal reflux disease)    Heart attack (HCC) 2002   Hyperlipidemia    Hypertension    PUD (peptic ulcer disease)    Sleep apnea    can't tolerate CPAP.  Uses O2 only   Spinal cord stimulator status    for lower back pain   Stroke [facility]) 2004   "mini-stroke"   Vertigo    Wears dentures    full upper and lower    Past Surgical History:  Procedure Laterality Date   ABDOMINAL HYSTERECTOMY     back surgery     broken wrist     COLONOSCOPY WITH PROPOFOL N/A 01/30/2017   Procedure: COLONOSCOPY WITH PROPOFOL;  Surgeon: Wyline Mood, MD;  Location: [facility] ENDOSCOPY;  Service: Endoscopy;  Laterality: N/A;   COLONOSCOPY WITH PROPOFOL N/A 02/20/2017   Procedure: Colonoscopy with propofol ;  Surgeon: Wyline Mood, MD;  Location: [facility] ENDOSCOPY;  Service: Endoscopy;  Laterality: N/A;   COLONOSCOPY WITH PROPOFOL N/A 03/13/2017   Procedure: COLONOSCOPY WITH PROPOFOL;  Surgeon: Wyline Mood, MD;  Location: [facility] ENDOSCOPY;  Service: Endoscopy;  Laterality: N/A;   COLONOSCOPY WITH PROPOFOL N/A 04/17/2017   Procedure: COLONOSCOPY WITH PROPOFOL;  Surgeon: Wyline Mood, MD;  Location: [facility] ENDOSCOPY;  Service: Endoscopy;  Laterality: N/A;   ESOPHAGOGASTRODUODENOSCOPY N/A 05/05/2020   Procedure: ESOPHAGOGASTRODUODENOSCOPY (EGD);  Surgeon: Toledo, Boykin Nearing, MD;   Location: [facility] ENDOSCOPY;  Service: Gastroenterology;  Laterality: N/A;   ESOPHAGOGASTRODUODENOSCOPY (EGD) WITH PROPOFOL N/A 09/28/2016   Procedure: ESOPHAGOGASTRODUODENOSCOPY (EGD) WITH PROPOFOL;  Surgeon: Scot Jun, MD;  Location: [facility] ENDOSCOPY;  Service: Endoscopy;  Laterality: N/A;   ESOPHAGOGASTRODUODENOSCOPY (EGD) WITH PROPOFOL N/A 03/13/2017   Procedure: ESOPHAGOGASTRODUODENOSCOPY (EGD) WITH PROPOFOL;  Surgeon: Wyline Mood, MD;  Location: [facility] ENDOSCOPY;  Service: Endoscopy;  Laterality: N/A;   GALLBLADDER SURGERY     GASTRIC BYPASS     GIVENS CAPSULE STUDY N/A 03/13/2017   Procedure: GIVENS CAPSULE STUDY;  Surgeon: Wyline Mood, MD;  Location: [facility] ENDOSCOPY;  Service: Endoscopy;  Laterality: N/A;   HERNIA REPAIR     JOINT REPLACEMENT  1995   REPLACEMENT TOTAL KNEE     ROTATOR CUFF REPAIR      Social History   Socioeconomic History   Marital status: Widowed    Spouse name: Not on file   Number of children: Not on file   Years of education: Not on file   Highest education level: Not on file  Occupational History   Occupation: disabled  Tobacco Use   Smoking status: Never   Smokeless tobacco: Never  Vaping Use   Vaping status: Never Used  Substance and Sexual Activity   Alcohol use: No    Alcohol/week: 0.0 standard drinks of alcohol   Drug use: No  Sexual activity: Yes  Other Topics Concern   Not on file  Social History Narrative   Not on file   Social Determinants of Health   Financial Resource Strain: Low Risk  (07/20/2022)   Received from [facility], [facility]   Overall Financial Resource Strain (CARDIA)    Difficulty of Paying Living Expenses: Not hard at all  Food Insecurity: No Food Insecurity (07/20/2022)   Received from [facility], [facility]   Hunger Vital Sign    Worried About Running Out of Food in the Last Year: Never true    Ran Out of Food in the Last Year: Never true  Transportation Needs: No Transportation Needs  (07/20/2022)   Received from [facility], [facility]   [facility] - Transportation    Lack of Transportation (Medical): No    Lack of Transportation (Non-Medical): No  Physical Activity: Not on file  Stress: Not on file  Social Connections: Not on file  Intimate Partner Violence: Not on file    Family History  Problem Relation Age of Onset   Hypertension Father    COPD Father    Heart disease Father    Breast cancer Neg Hx     Allergies  Allergen Reactions   Morphine Hives   Iodinated Contrast Media Hives   Aspirin Hives    Noted on MD progress notes and discussed with MD 09/02/15   Etodolac Hives   Ibuprofen Hives   Shellfish Allergy Hives   Tylenol [Acetaminophen] Hives    Upset stomach     Review of Systems  Constitutional:  Positive for weight loss.       11 lbs since 11/2022  HENT: Negative.    Eyes: Negative.   Respiratory:  Positive for cough.   Cardiovascular: Negative.   Gastrointestinal: Negative.   Genitourinary: Negative.   Skin: Negative.   Neurological: Negative.   Endo/Heme/Allergies: Negative.        Objective:   BP (!) 144/82   Pulse (!) 133   Ht 5\' 7"  (1.702 m)   Wt 253 lb (114.8 kg)   SpO2 98%   BMI 39.63 kg/m   Vitals:   06/11/23 1002  BP: (!) 144/82  Pulse: (!) 133  Height: 5\' 7"  (1.702 m)  Weight: 253 lb (114.8 kg)  SpO2: 98%  BMI (Calculated): 39.62    Physical Exam Vitals reviewed.  Constitutional:      General: She is not in acute distress.    Appearance: She is obese.  HENT:     Head: Normocephalic.     Nose: Nose normal.     Mouth/Throat:     Mouth: Mucous membranes are moist.  Eyes:     Extraocular Movements: Extraocular movements intact.     Pupils: Pupils are equal, round, and reactive to light.  Cardiovascular:     Rate and Rhythm: Normal rate and regular rhythm.     Heart sounds: No murmur heard. Pulmonary:     Effort: Pulmonary effort is normal.     Breath sounds: No rhonchi or rales.   Abdominal:     General: Abdomen is flat.     Palpations: There is no hepatomegaly, splenomegaly or mass.  Musculoskeletal:        General: Normal range of motion.     Cervical back: Normal range of motion. No tenderness.  Skin:    General: Skin is warm and dry.  Neurological:     General: No focal  deficit present.     Mental Status: She is alert and oriented to person, place, and time.     Cranial Nerves: No cranial nerve deficit.     Motor: No weakness.  Psychiatric:        Mood and Affect: Mood normal.        Behavior: Behavior normal.      No results found for any visits on 06/11/23.  Recent Results (from the past 2160 hour(Hebe Merriwether))  Lipid panel     Status: None   Collection Time: 05/10/23 11:53 AM  Result Value Ref Range   Cholesterol, Total 156 100 - 199 mg/dL   Triglycerides 161 0 - 149 mg/dL   HDL 70 >09 mg/dL   VLDL Cholesterol Cal 20 5 - 40 mg/dL   LDL Chol Calc (NIH) 66 0 - 99 mg/dL   Chol/HDL Ratio 2.2 0.0 - 4.4 ratio    Comment:                                   T. Chol/HDL Ratio                                             Men  Women                               1/2 Avg.Risk  3.4    3.3                                   Avg.Risk  5.0    4.4                                2X Avg.Risk  9.6    7.1                                3X Avg.Risk 23.4   11.0   Comprehensive metabolic panel     Status: Abnormal   Collection Time: 05/10/23 11:53 AM  Result Value Ref Range   Glucose 206 (H) 70 - 99 mg/dL   BUN 14 8 - 27 mg/dL   Creatinine, Ser 6.04 (H) 0.57 - 1.00 mg/dL   eGFR 43 (L) >54 UJ/WJX/9.14   BUN/Creatinine Ratio 10 (L) 12 - 28   Sodium 139 134 - 144 mmol/L   Potassium 4.6 3.5 - 5.2 mmol/L   Chloride 107 (H) 96 - 106 mmol/L   CO2 19 (L) 20 - 29 mmol/L   Calcium 9.4 8.7 - 10.3 mg/dL   Total Protein 6.6 6.0 - 8.5 g/dL   Albumin 3.7 (L) 3.9 - 4.9 g/dL   Globulin, Total 2.9 1.5 - 4.5 g/dL   Albumin/Globulin Ratio 1.3    Bilirubin Total 0.4 0.0 - 1.2 mg/dL    Alkaline Phosphatase 181 (H) 44 - 121 IU/L   AST 18 0 - 40 IU/L   ALT 15 0 - 32 IU/L  Hemoglobin A1c     Status: Abnormal   Collection Time: 05/10/23 11:53 AM  Result Value Ref Range   Hgb A1c MFr  Bld 7.3 (H) 4.8 - 5.6 %    Comment:          Prediabetes: 5.7 - 6.4          Diabetes: >6.4          Glycemic control for adults with diabetes: <7.0    Est. average glucose Bld gHb Est-mCnc 163 mg/dL      Assessment & Plan:   As per problem list  Problem List Items Addressed This Visit       Cardiovascular and Mediastinum   HTN (hypertension)   Relevant Medications   potassium chloride (KLOR-CON) 10 MEQ tablet     Endocrine   Type II diabetes mellitus with renal manifestations (HCC)   Relevant Medications   tirzepatide (MOUNJARO) 10 MG/0.5ML Pen     Other   Other chronic pain - Primary   Relevant Medications   Oxycodone HCl 20 MG TABS   Obesity (BMI 30-39.9)   Relevant Medications   tirzepatide (MOUNJARO) 10 MG/0.5ML Pen    Return in about 1 month (around 07/12/2023).   Total time spent: 20 minutes  Luna Fuse, MD  06/11/2023   This document may have been prepared by [facility] Voice Recognition software and as such may include unintentional dictation errors.

## 2023-07-05 ENCOUNTER — Other Ambulatory Visit: Payer: Self-pay | Admitting: Internal Medicine

## 2023-07-05 DIAGNOSIS — R0902 Hypoxemia: Secondary | ICD-10-CM | POA: Diagnosis not present

## 2023-07-05 DIAGNOSIS — I639 Cerebral infarction, unspecified: Secondary | ICD-10-CM

## 2023-07-09 ENCOUNTER — Other Ambulatory Visit: Payer: Self-pay | Admitting: Nurse Practitioner

## 2023-07-11 ENCOUNTER — Ambulatory Visit (INDEPENDENT_AMBULATORY_CARE_PROVIDER_SITE_OTHER): Payer: 59 | Admitting: Internal Medicine

## 2023-07-11 ENCOUNTER — Encounter: Payer: Self-pay | Admitting: Internal Medicine

## 2023-07-11 VITALS — BP 128/88 | HR 108 | Ht 67.0 in | Wt 259.2 lb

## 2023-07-11 DIAGNOSIS — M25512 Pain in left shoulder: Secondary | ICD-10-CM

## 2023-07-11 DIAGNOSIS — I639 Cerebral infarction, unspecified: Secondary | ICD-10-CM | POA: Diagnosis not present

## 2023-07-11 DIAGNOSIS — N1831 Chronic kidney disease, stage 3a: Secondary | ICD-10-CM

## 2023-07-11 DIAGNOSIS — E1122 Type 2 diabetes mellitus with diabetic chronic kidney disease: Secondary | ICD-10-CM

## 2023-07-11 DIAGNOSIS — M778 Other enthesopathies, not elsewhere classified: Secondary | ICD-10-CM | POA: Diagnosis not present

## 2023-07-11 DIAGNOSIS — Z794 Long term (current) use of insulin: Secondary | ICD-10-CM

## 2023-07-11 DIAGNOSIS — G8929 Other chronic pain: Secondary | ICD-10-CM | POA: Diagnosis not present

## 2023-07-11 DIAGNOSIS — E119 Type 2 diabetes mellitus without complications: Secondary | ICD-10-CM

## 2023-07-11 LAB — POCT CBG (FASTING - GLUCOSE)-MANUAL ENTRY: Glucose Fasting, POC: 156 mg/dL — AB (ref 70–99)

## 2023-07-11 MED ORDER — METHYLPREDNISOLONE SODIUM SUCC 40 MG IJ SOLR
40.0000 mg | Freq: Once | INTRAMUSCULAR | Status: AC
Start: 2023-07-11 — End: 2023-07-11
  Administered 2023-07-11: 40 mg via INTRAMUSCULAR

## 2023-07-11 MED ORDER — OXYCODONE HCL 20 MG PO TABS
20.0000 mg | ORAL_TABLET | Freq: Four times a day (QID) | ORAL | 0 refills | Status: DC
Start: 2023-07-11 — End: 2023-08-10

## 2023-07-11 NOTE — Progress Notes (Signed)
Established Patient Office Visit  Subjective:  Patient ID: Lindsey [name], female    DOB: November 29, 1960  Age: 62 y.o. MRN: [id number]  Chief Complaint  Patient presents with   Follow-up    Pain management    Here for pain management follow up. Chronic pain well controlled on current analgesia. Last UDS satisfactory and pill counts have also been satisfactory. C/o flare of left shoulder pain with h/o rotator cuff surgery. Also c/o left elbow discomfort.    No other concerns at this time.   Past Medical History:  Diagnosis Date   Allergy    Anemia    Arthritis    knees   CHF (congestive heart failure) (HCC)    COPD (chronic obstructive pulmonary disease) (HCC)    Diabetes mellitus without complication (HCC)    diet controlled   GERD (gastroesophageal reflux disease)    Heart attack (HCC) 2002   Hyperlipidemia    Hypertension    PUD (peptic ulcer disease)    Sleep apnea    can't tolerate CPAP.  Uses O2 only   Spinal cord stimulator status    for lower back pain   Stroke [facility]) 2004   "mini-stroke"   Vertigo    Wears dentures    full upper and lower    Past Surgical History:  Procedure Laterality Date   ABDOMINAL HYSTERECTOMY     back surgery     broken wrist     COLONOSCOPY WITH PROPOFOL N/A 01/30/2017   Procedure: COLONOSCOPY WITH PROPOFOL;  Surgeon: Wyline Mood, MD;  Location: [facility] ENDOSCOPY;  Service: Endoscopy;  Laterality: N/A;   COLONOSCOPY WITH PROPOFOL N/A 02/20/2017   Procedure: Colonoscopy with propofol ;  Surgeon: Wyline Mood, MD;  Location: [facility] ENDOSCOPY;  Service: Endoscopy;  Laterality: N/A;   COLONOSCOPY WITH PROPOFOL N/A 03/13/2017   Procedure: COLONOSCOPY WITH PROPOFOL;  Surgeon: Wyline Mood, MD;  Location: [facility] ENDOSCOPY;  Service: Endoscopy;  Laterality: N/A;   COLONOSCOPY WITH PROPOFOL N/A 04/17/2017   Procedure: COLONOSCOPY WITH PROPOFOL;  Surgeon: Wyline Mood, MD;  Location: [facility] ENDOSCOPY;  Service: Endoscopy;  Laterality: N/A;    ESOPHAGOGASTRODUODENOSCOPY N/A 05/05/2020   Procedure: ESOPHAGOGASTRODUODENOSCOPY (EGD);  Surgeon: Toledo, Boykin Nearing, MD;  Location: [facility] ENDOSCOPY;  Service: Gastroenterology;  Laterality: N/A;   ESOPHAGOGASTRODUODENOSCOPY (EGD) WITH PROPOFOL N/A 09/28/2016   Procedure: ESOPHAGOGASTRODUODENOSCOPY (EGD) WITH PROPOFOL;  Surgeon: Scot Jun, MD;  Location: [facility] ENDOSCOPY;  Service: Endoscopy;  Laterality: N/A;   ESOPHAGOGASTRODUODENOSCOPY (EGD) WITH PROPOFOL N/A 03/13/2017   Procedure: ESOPHAGOGASTRODUODENOSCOPY (EGD) WITH PROPOFOL;  Surgeon: Wyline Mood, MD;  Location: [facility] ENDOSCOPY;  Service: Endoscopy;  Laterality: N/A;   GALLBLADDER SURGERY     GASTRIC BYPASS     GIVENS CAPSULE STUDY N/A 03/13/2017   Procedure: GIVENS CAPSULE STUDY;  Surgeon: Wyline Mood, MD;  Location: [facility] ENDOSCOPY;  Service: Endoscopy;  Laterality: N/A;   HERNIA REPAIR     JOINT REPLACEMENT  1995   REPLACEMENT TOTAL KNEE     ROTATOR CUFF REPAIR      Social History   Socioeconomic History   Marital status: Widowed    Spouse name: Not on file   Number of children: Not on file   Years of education: Not on file   Highest education level: Not on file  Occupational History   Occupation: disabled  Tobacco Use   Smoking status: Never   Smokeless tobacco: Never  Vaping Use   Vaping status: Never Used  Substance and Sexual Activity   Alcohol use: No  Alcohol/week: 0.0 standard drinks of alcohol   Drug use: No   Sexual activity: Yes  Other Topics Concern   Not on file  Social History Narrative   Not on file   Social Determinants of Health   Financial Resource Strain: Low Risk  (07/20/2022)   Received from [facility], [facility]   Overall Financial Resource Strain (CARDIA)    Difficulty of Paying Living Expenses: Not hard at all  Food Insecurity: No Food Insecurity (07/20/2022)   Received from [facility], [facility]   Hunger Vital Sign    Worried About Running Out of Food in the Last  Year: Never true    Ran Out of Food in the Last Year: Never true  Transportation Needs: No Transportation Needs (07/20/2022)   Received from [facility], [facility]   [facility] - Transportation    Lack of Transportation (Medical): No    Lack of Transportation (Non-Medical): No  Physical Activity: Not on file  Stress: Not on file  Social Connections: Not on file  Intimate Partner Violence: Not on file    Family History  Problem Relation Age of Onset   Hypertension Father    COPD Father    Heart disease Father    Breast cancer Neg Hx     Allergies  Allergen Reactions   Morphine Hives   Iodinated Contrast Media Hives   Aspirin Hives    Noted on MD progress notes and discussed with MD 09/02/15   Etodolac Hives   Ibuprofen Hives   Shellfish Allergy Hives   Tylenol [Acetaminophen] Hives    Upset stomach     Review of Systems  Constitutional:  Positive for weight loss (gained 6 lbs).  HENT: Negative.    Eyes: Negative.   Cardiovascular: Negative.   Gastrointestinal: Negative.   Genitourinary: Negative.   Musculoskeletal:  Positive for joint pain.  Skin: Negative.   Neurological: Negative.   Endo/Heme/Allergies: Negative.        Objective:   BP 128/88   Pulse (!) 108   Ht 5\' 7"  (1.702 m)   Wt 259 lb 3.2 oz (117.6 kg)   SpO2 99%   BMI 40.60 kg/m   Vitals:   07/11/23 0825  BP: 128/88  Pulse: (!) 108  Height: 5\' 7"  (1.702 m)  Weight: 259 lb 3.2 oz (117.6 kg)  SpO2: 99%  BMI (Calculated): 40.59    Physical Exam Vitals reviewed.  Constitutional:      General: She is not in acute distress.    Appearance: She is obese.  HENT:     Head: Normocephalic.     Nose: Nose normal.     Mouth/Throat:     Mouth: Mucous membranes are moist.  Eyes:     Extraocular Movements: Extraocular movements intact.     Pupils: Pupils are equal, round, and reactive to light.  Cardiovascular:     Rate and Rhythm: Normal rate and regular rhythm.     Heart sounds: No  murmur heard. Pulmonary:     Effort: Pulmonary effort is normal.     Breath sounds: No rhonchi or rales.  Abdominal:     General: Abdomen is flat.     Palpations: There is no hepatomegaly, splenomegaly or mass.  Musculoskeletal:        General: Normal range of motion.     Cervical back: Normal range of motion. No tenderness.  Skin:    General: Skin is warm and dry.  Neurological:     General: No focal deficit present.     Mental Status: She is alert and oriented to person, place, and time.     Cranial Nerves: No cranial nerve deficit.     Motor: No weakness.  Psychiatric:        Mood and Affect: Mood normal.        Behavior: Behavior normal.      Results for orders placed or performed in visit on 07/11/23  POCT CBG (Fasting - Glucose)  Result Value Ref Range   Glucose Fasting, POC 156 (A) 70 - 99 mg/dL        Assessment & Plan:   Problem List Items Addressed This Visit       Endocrine   Type II diabetes mellitus with renal manifestations (HCC) - Primary   Relevant Orders   POCT CBG (Fasting - Glucose) (Completed)     Musculoskeletal and Integument   Left elbow tendonitis   Relevant Medications   methylPREDNISolone sodium succinate (SOLU-MEDROL) 40 mg/mL injection 40 mg     Other   Other chronic pain   Relevant Medications   Oxycodone HCl 20 MG TABS   methylPREDNISolone sodium succinate (SOLU-MEDROL) 40 mg/mL injection 40 mg   Arthralgia of left shoulder region   Relevant Orders   Ambulatory referral to Orthopedics    Return in about 1 month (around 08/11/2023) for fu with labs prior.   Total time spent: 30 minutes  Luna Fuse, MD  07/11/2023   This document may have been prepared by [facility] Voice Recognition software and as such may include unintentional dictation errors.

## 2023-07-23 ENCOUNTER — Other Ambulatory Visit: Payer: Self-pay

## 2023-07-23 MED ORDER — BELSOMRA 20 MG PO TABS: 1.0000 | ORAL_TABLET | Freq: Every day | ORAL | 3 refills | Status: DC

## 2023-07-23 MED ORDER — DICLOFENAC SODIUM 1 % EX GEL: 4.0000 g | Freq: Two times a day (BID) | CUTANEOUS | 3 refills | Status: DC

## 2023-08-04 ENCOUNTER — Other Ambulatory Visit: Payer: Self-pay | Admitting: Internal Medicine

## 2023-08-05 DIAGNOSIS — R0902 Hypoxemia: Secondary | ICD-10-CM | POA: Diagnosis not present

## 2023-08-06 ENCOUNTER — Telehealth: Payer: Self-pay

## 2023-08-06 ENCOUNTER — Other Ambulatory Visit: Payer: 59

## 2023-08-06 ENCOUNTER — Other Ambulatory Visit: Payer: Self-pay

## 2023-08-06 DIAGNOSIS — E119 Type 2 diabetes mellitus without complications: Secondary | ICD-10-CM

## 2023-08-06 DIAGNOSIS — I639 Cerebral infarction, unspecified: Secondary | ICD-10-CM | POA: Diagnosis not present

## 2023-08-06 MED ORDER — BUSPIRONE HCL 7.5 MG PO TABS: 7.5000 mg | ORAL_TABLET | Freq: Two times a day (BID) | ORAL | 0 refills | Status: DC

## 2023-08-06 NOTE — Telephone Encounter (Signed)
Patient called asking for call back

## 2023-08-06 NOTE — Telephone Encounter (Signed)
Spoke with pt and request has already been put in for Rx to be filled by provider.

## 2023-08-07 LAB — COMPREHENSIVE METABOLIC PANEL
ALT: 33 IU/L — ABNORMAL HIGH (ref 0–32)
AST: 24 IU/L (ref 0–40)
Albumin: 3.4 g/dL — ABNORMAL LOW (ref 3.9–4.9)
Alkaline Phosphatase: 151 IU/L — ABNORMAL HIGH (ref 44–121)
BUN/Creatinine Ratio: 13 (ref 12–28)
BUN: 17 mg/dL (ref 8–27)
Bilirubin Total: 0.4 mg/dL (ref 0.0–1.2)
CO2: 16 mmol/L — ABNORMAL LOW (ref 20–29)
Calcium: 9 mg/dL (ref 8.7–10.3)
Chloride: 107 mmol/L — ABNORMAL HIGH (ref 96–106)
Creatinine, Ser: 1.32 mg/dL — ABNORMAL HIGH (ref 0.57–1.00)
Globulin, Total: 3.3 g/dL (ref 1.5–4.5)
Glucose: 103 mg/dL — ABNORMAL HIGH (ref 70–99)
Potassium: 4.7 mmol/L (ref 3.5–5.2)
Sodium: 140 mmol/L (ref 134–144)
Total Protein: 6.7 g/dL (ref 6.0–8.5)
eGFR: 45 mL/min/{1.73_m2} — ABNORMAL LOW (ref >59)

## 2023-08-07 LAB — HEMOGLOBIN A1C
Est. average glucose Bld gHb Est-mCnc: 143 mg/dL
Hgb A1c MFr Bld: 6.6 % — ABNORMAL HIGH (ref 4.8–5.6)

## 2023-08-07 LAB — LIPID PANEL
Chol/HDL Ratio: 2.8 ratio (ref 0.0–4.4)
Cholesterol, Total: 150 mg/dL (ref 100–199)
HDL: 53 mg/dL (ref >39)
LDL Chol Calc (NIH): 83 mg/dL (ref 0–99)
Triglycerides: 73 mg/dL (ref 0–149)
VLDL Cholesterol Cal: 14 mg/dL (ref 5–40)

## 2023-08-07 LAB — TSH: TSH: 1.33 u[IU]/mL (ref 0.450–4.500)

## 2023-08-10 ENCOUNTER — Encounter: Payer: Self-pay | Admitting: Internal Medicine

## 2023-08-10 ENCOUNTER — Ambulatory Visit (INDEPENDENT_AMBULATORY_CARE_PROVIDER_SITE_OTHER): Payer: 59 | Admitting: Internal Medicine

## 2023-08-10 VITALS — BP 129/98 | HR 106 | Ht 67.0 in | Wt 253.4 lb

## 2023-08-10 DIAGNOSIS — F5101 Primary insomnia: Secondary | ICD-10-CM | POA: Diagnosis not present

## 2023-08-10 DIAGNOSIS — N1831 Long term (current) use of insulin: Secondary | ICD-10-CM

## 2023-08-10 DIAGNOSIS — I10 Essential (primary) hypertension: Secondary | ICD-10-CM | POA: Diagnosis not present

## 2023-08-10 DIAGNOSIS — Z794 Long term (current) use of insulin: Secondary | ICD-10-CM | POA: Diagnosis not present

## 2023-08-10 DIAGNOSIS — E1122 Type 2 diabetes mellitus with diabetic chronic kidney disease: Secondary | ICD-10-CM

## 2023-08-10 DIAGNOSIS — G8929 Other chronic pain: Secondary | ICD-10-CM

## 2023-08-10 MED ORDER — OXYCODONE HCL 20 MG PO TABS: 20.0000 mg | ORAL_TABLET | Freq: Four times a day (QID) | ORAL | 0 refills | Status: DC

## 2023-08-10 MED ORDER — BELSOMRA 20 MG PO TABS
1.0000 | ORAL_TABLET | Freq: Every day | ORAL | 3 refills | Status: DC
Start: 2023-08-10 — End: 2023-10-22

## 2023-08-10 NOTE — Progress Notes (Signed)
Established Patient Office Visit  Subjective:  Patient ID: Lindsey [name], female    DOB: 1961-03-30  Age: 62 y.o. MRN: [id number]  Chief Complaint  Patient presents with   Pain Management    Pain management     Here for pain management follow up. Chronic pain well controlled on current analgesia. Last UDS satisfactory and pill counts have also been satisfactory. Labs reviewed and notable for well controlled/uncontrolled diabetes, A1c now at target, lipids at target with unremarkable cmp. Denies any hypoglycemic episodes and home bg readings have been at target.    No other concerns at this time.   Past Medical History:  Diagnosis Date   Allergy    Anemia    Arthritis    knees   CHF (congestive heart failure) (HCC)    COPD (chronic obstructive pulmonary disease) (HCC)    Diabetes mellitus without complication (HCC)    diet controlled   GERD (gastroesophageal reflux disease)    Heart attack (HCC) 2002   Hyperlipidemia    Hypertension    PUD (peptic ulcer disease)    Sleep apnea    can't tolerate CPAP.  Uses O2 only   Spinal cord stimulator status    for lower back pain   Stroke [facility]) 2004   "mini-stroke"   Vertigo    Wears dentures    full upper and lower    Past Surgical History:  Procedure Laterality Date   ABDOMINAL HYSTERECTOMY     back surgery     broken wrist     COLONOSCOPY WITH PROPOFOL N/A 01/30/2017   Procedure: COLONOSCOPY WITH PROPOFOL;  Surgeon: Wyline Mood, MD;  Location: [facility] ENDOSCOPY;  Service: Endoscopy;  Laterality: N/A;   COLONOSCOPY WITH PROPOFOL N/A 02/20/2017   Procedure: Colonoscopy with propofol ;  Surgeon: Wyline Mood, MD;  Location[facility] ENDOSCOPY;  Service: Endoscopy;  Laterality: N/A;   COLONOSCOPY WITH PROPOFOL N/A 03/13/2017   Procedure: COLONOSCOPY WITH PROPOFOL;  Surgeon: Wyline Mood, MD;  Location: [facility] ENDOSCOPY;  Service: Endoscopy;  Laterality: N/A;   COLONOSCOPY WITH PROPOFOL N/A 04/17/2017   Procedure: COLONOSCOPY WITH  PROPOFOL;  Surgeon: Wyline Mood, MD;  Location: [facility] ENDOSCOPY;  Service: Endoscopy;  Laterality: N/A;   ESOPHAGOGASTRODUODENOSCOPY N/A 05/05/2020   Procedure: ESOPHAGOGASTRODUODENOSCOPY (EGD);  Surgeon: Toledo, Boykin Nearing, MD;  Location: [facility] ENDOSCOPY;  Service: Gastroenterology;  Laterality: N/A;   ESOPHAGOGASTRODUODENOSCOPY (EGD) WITH PROPOFOL N/A 09/28/2016   Procedure: ESOPHAGOGASTRODUODENOSCOPY (EGD) WITH PROPOFOL;  Surgeon: Scot Jun, MD;  Location: [facility] ENDOSCOPY;  Service: Endoscopy;  Laterality: N/A;   ESOPHAGOGASTRODUODENOSCOPY (EGD) WITH PROPOFOL N/A 03/13/2017   Procedure: ESOPHAGOGASTRODUODENOSCOPY (EGD) WITH PROPOFOL;  Surgeon: Wyline Mood, MD;  Location: [facility] ENDOSCOPY;  Service: Endoscopy;  Laterality: N/A;   GALLBLADDER SURGERY     GASTRIC BYPASS     GIVENS CAPSULE STUDY N/A 03/13/2017   Procedure: GIVENS CAPSULE STUDY;  Surgeon: Wyline Mood, MD;  Location: [facility] ENDOSCOPY;  Service: Endoscopy;  Laterality: N/A;   HERNIA REPAIR     JOINT REPLACEMENT  1995   REPLACEMENT TOTAL KNEE     ROTATOR CUFF REPAIR      Social History   Socioeconomic History   Marital status: Widowed    Spouse name: Not on file   Number of children: Not on file   Years of education: Not on file   Highest education level: Not on file  Occupational History   Occupation: disabled  Tobacco Use   Smoking status: Never   Smokeless tobacco: Never  Vaping Use  Vaping status: Never Used  Substance and Sexual Activity   Alcohol use: No    Alcohol/week: 0.0 standard drinks of alcohol   Drug use: No   Sexual activity: Yes  Other Topics Concern   Not on file  Social History Narrative   Not on file   Social Determinants of Health   Financial Resource Strain: Low Risk  (07/20/2022)   Received from [facility], [facility]   Overall Financial Resource Strain (CARDIA)    Difficulty of Paying Living Expenses: Not hard at all  Food Insecurity: No Food Insecurity (07/20/2022)   Received from  [facility], [facility]   Hunger Vital Sign    Worried About Running Out of Food in the Last Year: Never true    Ran Out of Food in the Last Year: Never true  Transportation Needs: No Transportation Needs (07/20/2022)   Received from [facility], [facility]   [facility] - Transportation    Lack of Transportation (Medical): No    Lack of Transportation (Non-Medical): No  Physical Activity: Not on file  Stress: Not on file  Social Connections: Not on file  Intimate Partner Violence: Not on file    Family History  Problem Relation Age of Onset   Hypertension Father    COPD Father    Heart disease Father    Breast cancer Neg Hx     Allergies  Allergen Reactions   Morphine Hives   Iodinated Contrast Media Hives   Aspirin Hives    Noted on MD progress notes and discussed with MD 09/02/15   Etodolac Hives   Ibuprofen Hives   Shellfish Allergy Hives   Tylenol [Acetaminophen] Hives    Upset stomach     Review of Systems  HENT: Negative.    Eyes: Negative.   Respiratory:  Positive for cough.   Cardiovascular: Negative.   Gastrointestinal: Negative.   Genitourinary: Negative.   Skin: Negative.   Neurological: Negative.   Endo/Heme/Allergies: Negative.        Objective:   BP (!) 129/98   Pulse (!) 106   Ht 5\' 7"  (1.702 m)   Wt 253 lb 6.4 oz (114.9 kg)   SpO2 98%   BMI 39.69 kg/m   Vitals:   08/10/23 1039  BP: (!) 129/98  Pulse: (!) 106  Height: 5\' 7"  (1.702 m)  Weight: 253 lb 6.4 oz (114.9 kg)  SpO2: 98%  BMI (Calculated): 39.68    Physical Exam Vitals reviewed.  Constitutional:      General: She is not in acute distress.    Appearance: She is obese.  HENT:     Head: Normocephalic.     Nose: Nose normal.     Mouth/Throat:     Mouth: Mucous membranes are moist.  Eyes:     Extraocular Movements: Extraocular movements intact.     Pupils: Pupils are equal, round, and reactive to light.  Cardiovascular:     Rate and Rhythm: Normal rate  and regular rhythm.     Heart sounds: No murmur heard. Pulmonary:     Effort: Pulmonary effort is normal.     Breath sounds: No rhonchi or rales.  Abdominal:     General: Abdomen is flat.     Palpations: There is no hepatomegaly, splenomegaly or mass.  Musculoskeletal:        General: Normal range of motion.     Cervical back: Normal range of motion. No tenderness.  Skin:  General: Skin is warm and dry.  Neurological:     General: No focal deficit present.     Mental Status: She is alert and oriented to person, place, and time.     Cranial Nerves: No cranial nerve deficit.     Motor: No weakness.  Psychiatric:        Mood and Affect: Mood normal.        Behavior: Behavior normal.      No results found for any visits on 08/10/23.  Recent Results (from the past 2160 hour(Giovanni Biby))  POCT CBG (Fasting - Glucose)     Status: Abnormal   Collection Time: 07/11/23  8:32 AM  Result Value Ref Range   Glucose Fasting, POC 156 (A) 70 - 99 mg/dL  TSH     Status: None   Collection Time: 08/06/23 10:59 AM  Result Value Ref Range   TSH 1.330 0.450 - 4.500 uIU/mL  Comprehensive metabolic panel     Status: Abnormal   Collection Time: 08/06/23 10:59 AM  Result Value Ref Range   Glucose 103 (H) 70 - 99 mg/dL   BUN 17 8 - 27 mg/dL   Creatinine, Ser 3.08 (H) 0.57 - 1.00 mg/dL   eGFR 45 (L) >65 HQ/ION/6.29   BUN/Creatinine Ratio 13 12 - 28   Sodium 140 134 - 144 mmol/L   Potassium 4.7 3.5 - 5.2 mmol/L   Chloride 107 (H) 96 - 106 mmol/L   CO2 16 (L) 20 - 29 mmol/L   Calcium 9.0 8.7 - 10.3 mg/dL   Total Protein 6.7 6.0 - 8.5 g/dL   Albumin 3.4 (L) 3.9 - 4.9 g/dL   Globulin, Total 3.3 1.5 - 4.5 g/dL   Bilirubin Total 0.4 0.0 - 1.2 mg/dL   Alkaline Phosphatase 151 (H) 44 - 121 IU/L   AST 24 0 - 40 IU/L   ALT 33 (H) 0 - 32 IU/L  Lipid panel     Status: None   Collection Time: 08/06/23 10:59 AM  Result Value Ref Range   Cholesterol, Total 150 100 - 199 mg/dL   Triglycerides 73 0 - 149  mg/dL   HDL 53 >52 mg/dL   VLDL Cholesterol Cal 14 5 - 40 mg/dL   LDL Chol Calc (NIH) 83 0 - 99 mg/dL   Chol/HDL Ratio 2.8 0.0 - 4.4 ratio    Comment:                                   T. Chol/HDL Ratio                                             Men  Women                               1/2 Avg.Risk  3.4    3.3                                   Avg.Risk  5.0    4.4  2X Avg.Risk  9.6    7.1                                3X Avg.Risk 23.4   11.0   Hemoglobin A1c     Status: Abnormal   Collection Time: 08/06/23 10:59 AM  Result Value Ref Range   Hgb A1c MFr Bld 6.6 (H) 4.8 - 5.6 %    Comment:          Prediabetes: 5.7 - 6.4          Diabetes: >6.4          Glycemic control for adults with diabetes: <7.0    Est. average glucose Bld gHb Est-mCnc 143 mg/dL      Assessment & Plan:  As per problem list.  Problem List Items Addressed This Visit       Cardiovascular and Mediastinum   HTN (hypertension)     Endocrine   Type II diabetes mellitus with renal manifestations (HCC)     Other   Other chronic pain   Relevant Medications   Oxycodone HCl 20 MG TABS   Primary insomnia - Primary   Relevant Medications   Suvorexant (BELSOMRA) 20 MG TABS    Return in about 1 month (around 09/09/2023) for Pain management.   Total time spent: 20 minutes  Luna Fuse, MD  08/10/2023   This document may have been prepared by [facility] Voice Recognition software and as such may include unintentional dictation errors.

## 2023-08-15 ENCOUNTER — Emergency Department
Admission: EM | Admit: 2023-08-15 | Discharge: 2023-08-15 | Disposition: A | Discharge status: Home / Self Care | Payer: 59 | Attending: Emergency Medicine | Admitting: Emergency Medicine

## 2023-08-15 ENCOUNTER — Emergency Department: Payer: 59

## 2023-08-15 DIAGNOSIS — J111 Influenza due to unidentified influenza virus with other respiratory manifestations: Secondary | ICD-10-CM

## 2023-08-15 DIAGNOSIS — J449 Chronic obstructive pulmonary disease, unspecified: Secondary | ICD-10-CM | POA: Diagnosis not present

## 2023-08-15 DIAGNOSIS — Z20822 Contact with and (suspected) exposure to covid-19: Secondary | ICD-10-CM | POA: Insufficient documentation

## 2023-08-15 DIAGNOSIS — I509 Heart failure, unspecified: Secondary | ICD-10-CM | POA: Diagnosis not present

## 2023-08-15 DIAGNOSIS — J9611 Chronic respiratory failure with hypoxia: Secondary | ICD-10-CM | POA: Insufficient documentation

## 2023-08-15 DIAGNOSIS — E119 Type 2 diabetes mellitus without complications: Secondary | ICD-10-CM | POA: Diagnosis not present

## 2023-08-15 DIAGNOSIS — I110 Hypertensive heart disease with heart failure: Secondary | ICD-10-CM | POA: Diagnosis not present

## 2023-08-15 DIAGNOSIS — R0981 Nasal congestion: Secondary | ICD-10-CM | POA: Diagnosis present

## 2023-08-15 DIAGNOSIS — Z743 Need for continuous supervision: Secondary | ICD-10-CM | POA: Diagnosis not present

## 2023-08-15 DIAGNOSIS — R059 Cough, unspecified: Secondary | ICD-10-CM | POA: Diagnosis not present

## 2023-08-15 DIAGNOSIS — I499 Cardiac arrhythmia, unspecified: Secondary | ICD-10-CM | POA: Diagnosis not present

## 2023-08-15 DIAGNOSIS — R0789 Other chest pain: Secondary | ICD-10-CM | POA: Diagnosis not present

## 2023-08-15 DIAGNOSIS — R079 Chest pain, unspecified: Secondary | ICD-10-CM | POA: Diagnosis not present

## 2023-08-15 DIAGNOSIS — J9621 Acute and chronic respiratory failure with hypoxia: Secondary | ICD-10-CM | POA: Diagnosis not present

## 2023-08-15 LAB — CBC
HCT: 41.4 % (ref 36.0–46.0)
Hemoglobin: 12.8 g/dL (ref 12.0–15.0)
MCH: 27.2 pg (ref 26.0–34.0)
MCHC: 30.9 g/dL (ref 30.0–36.0)
MCV: 87.9 fL (ref 80.0–100.0)
Platelets: 334 10*3/uL (ref 150–400)
RBC: 4.71 MIL/uL (ref 3.87–5.11)
RDW: 15.9 % — ABNORMAL HIGH (ref 11.5–15.5)
WBC: 8.2 10*3/uL (ref 4.0–10.5)
nRBC: 0 % (ref 0.0–0.2)

## 2023-08-15 LAB — BASIC METABOLIC PANEL WITH GFR
Anion gap: 6 (ref 5–15)
BUN: 27 mg/dL — ABNORMAL HIGH (ref 8–23)
CO2: 23 mmol/L (ref 22–32)
Calcium: 8.5 mg/dL — ABNORMAL LOW (ref 8.9–10.3)
Chloride: 111 mmol/L (ref 98–111)
Creatinine, Ser: 1.29 mg/dL — ABNORMAL HIGH (ref 0.44–1.00)
GFR, Estimated: 46 mL/min — ABNORMAL LOW (ref >60)
Glucose, Bld: 145 mg/dL — ABNORMAL HIGH (ref 70–99)
Potassium: 3.9 mmol/L (ref 3.5–5.1)
Sodium: 140 mmol/L (ref 135–145)

## 2023-08-15 LAB — TROPONIN I (HIGH SENSITIVITY): Troponin I (High Sensitivity): 4 ng/L (ref <18)

## 2023-08-15 LAB — SARS CORONAVIRUS 2 BY RT PCR: SARS Coronavirus 2 by RT PCR: NEGATIVE

## 2023-08-15 MED ORDER — PREDNISONE 20 MG PO TABS
40.0000 mg | ORAL_TABLET | ORAL | Status: AC
  Administered 2023-08-15: 40 mg via ORAL
  Filled 2023-08-15: qty 2

## 2023-08-15 MED ORDER — ONDANSETRON 4 MG PO TBDP
4.0000 mg | ORAL_TABLET | Freq: Once | ORAL | Status: AC
  Administered 2023-08-15: 4 mg via ORAL
  Filled 2023-08-15: qty 1

## 2023-08-15 MED ORDER — PREDNISONE 20 MG PO TABS: 40.0000 mg | ORAL_TABLET | Freq: Every day | ORAL | 0 refills | Status: AC

## 2023-08-15 NOTE — ED Triage Notes (Signed)
Pt c/o URI symptoms including congestion, subjective fevers, malaise, general body aches. Pt also c/o midsternal non-radiating CP since Saturday. Pt took one SL NTG without relief yesterday.

## 2023-08-15 NOTE — ED Provider Notes (Signed)
[facility] Provider Note    Event Date/Time   First MD Initiated Contact with Patient 08/15/23 1323     (approximate)   History   Chief Complaint: URI and Chest Pain   HPI  Lindsey [name] is a 61 y.o. female with a history of hypertension COPD GERD diabetes CHF and obesity who comes ED complaining of nasal congestion, chills, malaise body aches and fatigue for the past 3 days.  Also associated with some vague chest discomfort.  She uses 2 L nasal cannula oxygen chronically.  No lower extremity edema or calf pain.     Physical Exam   Triage Vital Signs: ED Triage Vitals  Encounter Vitals Group     BP 08/15/23 1104 129/80     Systolic BP Percentile --      Diastolic BP Percentile --      Pulse Rate 08/15/23 1104 (!) 107     Resp 08/15/23 1104 17     Temp 08/15/23 1104 98.4 F (36.9 C)     Temp Source 08/15/23 1104 Oral     SpO2 08/15/23 1104 97 %     Weight --      Height --      Head Circumference --      Peak Flow --      Pain Score 08/15/23 1104 9     Pain Loc --      Pain Education --      Exclude from Growth Chart --     Most recent vital signs: Vitals:   08/15/23 1104  BP: 129/80  Pulse: (!) 107  Resp: 17  Temp: 98.4 F (36.9 C)  SpO2: 97%    General: Awake, no distress.  CV:  Good peripheral perfusion.  Regular rate and rhythm Resp:  Normal effort.  Clear to auscultation bilaterally, no wheezes or crackles Abd:  No distention.  Soft nontender Other:  Moist oral mucosa.  No lower extremity edema or calf tenderness   ED Results / Procedures / Treatments   Labs (all labs ordered are listed, but only abnormal results are displayed) Labs Reviewed  BASIC METABOLIC PANEL - Abnormal; Notable for the following components:      Result Value   Glucose, Bld 145 (*)    BUN 27 (*)    Creatinine, Ser 1.29 (*)    Calcium 8.5 (*)    GFR, Estimated 46 (*)    All other components within normal limits  CBC - Abnormal; Notable for  the following components:   RDW 15.9 (*)    All other components within normal limits  SARS CORONAVIRUS 2 BY RT PCR  TROPONIN I (HIGH SENSITIVITY)     EKG Interpreted by me Sinus tachycardia rate 110.  Normal axis, normal intervals.  Normal QRS ST segments and T waves.   RADIOLOGY Chest x-ray interpreted by me, unremarkable.  Radiology report reviewed   PROCEDURES:  Procedures   MEDICATIONS ORDERED IN ED: Medications  predniSONE (DELTASONE) tablet 40 mg (has no administration in time range)  ondansetron (ZOFRAN-ODT) disintegrating tablet 4 mg (has no administration in time range)     IMPRESSION / MDM / ASSESSMENT AND PLAN / ED COURSE  I reviewed the triage vital signs and the nursing notes.  DDx: Viral illness, COVID, AKI, electrolyte abnormality, non-STEMI, anemia, pneumonia, pleural effusion  Patient's presentation is most consistent with acute presentation with potential threat to life or bodily function.  Patient presents with constitutional symptoms and some increase in  her chronic shortness of breath.  Vitals, exam, and labs are reassuring.  Suspect viral respiratory illness.  COVID-negative.  Considering the patient's symptoms, medical history, and physical examination today, I have low suspicion for ACS, PE, TAD, pneumothorax, carditis, mediastinitis, pneumonia, CHF, or sepsis. Stable for discharge and outpatient follow-up.        FINAL CLINICAL IMPRESSION(S) / ED DIAGNOSES   Final diagnoses:  Influenza-like illness  Chronic respiratory failure with hypoxia (HCC)     Rx / DC Orders   ED Discharge Orders          Ordered    predniSONE (DELTASONE) 20 MG tablet  Daily with breakfast        08/15/23 1339             Note:  This document was prepared using Dragon voice recognition software and may include unintentional dictation errors.   Sharman Cheek, MD 08/15/23 1515

## 2023-08-15 NOTE — Discharge Instructions (Addendum)
Continue taking your home medications as usual. Add on prednisone for the next few days to help control your symptoms.

## 2023-08-16 ENCOUNTER — Telehealth: Payer: Self-pay

## 2023-08-16 NOTE — Telephone Encounter (Signed)
Patient lm asking for call back, she also spoke with Dahlia Client but wouldn't tell her the nature of the call,   I called patient back but she didn't answer LM- ao

## 2023-08-17 ENCOUNTER — Encounter: Payer: Self-pay | Admitting: Internal Medicine

## 2023-08-17 ENCOUNTER — Ambulatory Visit: Payer: 59 | Admitting: Internal Medicine

## 2023-08-17 VITALS — BP 130/83 | HR 108 | Ht 67.0 in | Wt 260.2 lb

## 2023-08-17 DIAGNOSIS — J069 Acute upper respiratory infection, unspecified: Secondary | ICD-10-CM

## 2023-08-17 NOTE — Progress Notes (Signed)
Established Patient Office Visit  Subjective:  Patient ID: Lindsey [name], female    DOB: 1961/05/05  Age: 62 y.o. MRN: [id number]  Chief Complaint  Patient presents with   Follow-up    SUBJECTIVE:  Lindsey [name] is a 62 y.o. female who complains of congestion and dry cough for 6 days. She was in the ER 3 days ago, tested negative for COVID and was prescribed 4 days of 40 mg Prednisone. She also c/o persistent SOBOE and pleuritic cp. She denies a history of vomiting and has a history of asthma. Patient denies smoking cigarettes.       No other concerns at this time.   Past Medical History:  Diagnosis Date   Allergy    Anemia    Arthritis    knees   CHF (congestive heart failure) (HCC)    COPD (chronic obstructive pulmonary disease) (HCC)    Diabetes mellitus without complication (HCC)    diet controlled   GERD (gastroesophageal reflux disease)    Heart attack (HCC) 2002   Hyperlipidemia    Hypertension    PUD (peptic ulcer disease)    Sleep apnea    can't tolerate CPAP.  Uses O2 only   Spinal cord stimulator status    for lower back pain   Stroke [facility]) 2004   "mini-stroke"   Vertigo    Wears dentures    full upper and lower    Past Surgical History:  Procedure Laterality Date   ABDOMINAL HYSTERECTOMY     back surgery     broken wrist     COLONOSCOPY WITH PROPOFOL N/A 01/30/2017   Procedure: COLONOSCOPY WITH PROPOFOL;  Surgeon: Wyline Mood, MD;  Location: [facility] ENDOSCOPY;  Service: Endoscopy;  Laterality: N/A;   COLONOSCOPY WITH PROPOFOL N/A 02/20/2017   Procedure: Colonoscopy with propofol ;  Surgeon: Wyline Mood, MD;  Location: [facility] ENDOSCOPY;  Service: Endoscopy;  Laterality: N/A;   COLONOSCOPY WITH PROPOFOL N/A 03/13/2017   Procedure: COLONOSCOPY WITH PROPOFOL;  Surgeon: Wyline Mood, MD;  Location: [facility] ENDOSCOPY;  Service: Endoscopy;  Laterality: N/A;   COLONOSCOPY WITH PROPOFOL N/A 04/17/2017   Procedure: COLONOSCOPY WITH PROPOFOL;  Surgeon: Wyline Mood,  MD;  Location: [facility] ENDOSCOPY;  Service: Endoscopy;  Laterality: N/A;   ESOPHAGOGASTRODUODENOSCOPY N/A 05/05/2020   Procedure: ESOPHAGOGASTRODUODENOSCOPY (EGD);  Surgeon: Toledo, Boykin Nearing, MD;  Location: [facility] ENDOSCOPY;  Service: Gastroenterology;  Laterality: N/A;   ESOPHAGOGASTRODUODENOSCOPY (EGD) WITH PROPOFOL N/A 09/28/2016   Procedure: ESOPHAGOGASTRODUODENOSCOPY (EGD) WITH PROPOFOL;  Surgeon: Scot Jun, MD;  Location: [facility] ENDOSCOPY;  Service: Endoscopy;  Laterality: N/A;   ESOPHAGOGASTRODUODENOSCOPY (EGD) WITH PROPOFOL N/A 03/13/2017   Procedure: ESOPHAGOGASTRODUODENOSCOPY (EGD) WITH PROPOFOL;  Surgeon: Wyline Mood, MD;  Location: [facility] ENDOSCOPY;  Service: Endoscopy;  Laterality: N/A;   GALLBLADDER SURGERY     GASTRIC BYPASS     GIVENS CAPSULE STUDY N/A 03/13/2017   Procedure: GIVENS CAPSULE STUDY;  Surgeon: Wyline Mood, MD;  Location: [facility] ENDOSCOPY;  Service: Endoscopy;  Laterality: N/A;   HERNIA REPAIR     JOINT REPLACEMENT  1995   REPLACEMENT TOTAL KNEE     ROTATOR CUFF REPAIR      Social History   Socioeconomic History   Marital status: Widowed    Spouse name: Not on file   Number of children: Not on file   Years of education: Not on file   Highest education level: Not on file  Occupational History   Occupation: disabled  Tobacco Use   Smoking status: Never  Smokeless tobacco: Never  Vaping Use   Vaping status: Never Used  Substance and Sexual Activity   Alcohol use: No    Alcohol/week: 0.0 standard drinks of alcohol   Drug use: No   Sexual activity: Yes  Other Topics Concern   Not on file  Social History Narrative   Not on file   Social Determinants of Health   Financial Resource Strain: Low Risk  (07/20/2022)   Received from [facility]   Overall Financial Resource Strain (CARDIA)    Difficulty of Paying Living Expenses: Not hard at all  Food Insecurity: No Food Insecurity (07/20/2022)   Received from [facility], [facility]    Hunger Vital Sign    Worried About Running Out of Food in the Last Year: Never true    Ran Out of Food in the Last Year: Never true  Transportation Needs: No Transportation Needs (07/20/2022)   Received from [facility]   [facility] - Transportation    Lack of Transportation (Medical): No    Lack of Transportation (Non-Medical): No  Physical Activity: Not on file  Stress: Not on file  Social Connections: Not on file  Intimate Partner Violence: Not on file    Family History  Problem Relation Age of Onset   Hypertension Father    COPD Father    Heart disease Father    Breast cancer Neg Hx     Allergies  Allergen Reactions   Morphine Hives   Iodinated Contrast Media Hives   Aspirin Hives    Noted on MD progress notes and discussed with MD 09/02/15   Etodolac Hives   Ibuprofen Hives   Shellfish Allergy Hives   Tylenol [Acetaminophen] Hives    Upset stomach     Review of Systems  HENT: Negative.    Eyes: Negative.   Respiratory:  Positive for cough.   Cardiovascular: Negative.   Gastrointestinal: Negative.   Genitourinary: Negative.   Skin: Negative.   Neurological: Negative.   Endo/Heme/Allergies: Negative.       Objective:   BP 130/83   Pulse (!) 108   Ht 5\' 7"  (1.702 m)   Wt 260 lb 3.2 oz (118 kg)   SpO2 98%   BMI 40.75 kg/m   Vitals:   08/17/23 1402  BP: 130/83  Pulse: (!) 108  Height: 5\' 7"  (1.702 m)  Weight: 260 lb 3.2 oz (118 kg)  SpO2: 98%  BMI (Calculated): 40.74    Physical Exam Vitals reviewed.  Constitutional:      General: She is not in acute distress.    Appearance: She is obese.  HENT:     Head: Normocephalic.     Nose: Nose normal.     Mouth/Throat:     Mouth: Mucous membranes are moist.  Eyes:     Extraocular Movements: Extraocular movements intact.     Pupils: Pupils are equal, round, and reactive to light.  Cardiovascular:     Rate and Rhythm: Normal rate and regular rhythm.     Heart sounds: No murmur  heard. Pulmonary:     Effort: Pulmonary effort is normal.     Breath sounds: No rhonchi or rales.  Abdominal:     General: Abdomen is flat.     Palpations: There is no hepatomegaly, splenomegaly or mass.  Musculoskeletal:        General: Normal range of motion.     Cervical back: Normal range of motion.  No tenderness.  Skin:    General: Skin is warm and dry.  Neurological:     General: No focal deficit present.     Mental Status: She is alert and oriented to person, place, and time.     Cranial Nerves: No cranial nerve deficit.     Motor: No weakness.  Psychiatric:        Mood and Affect: Mood normal.        Behavior: Behavior normal.      No results found for any visits on 08/17/23.  Recent Results (from the past 2160 hour(Jerard Bays))  POCT CBG (Fasting - Glucose)     Status: Abnormal   Collection Time: 07/11/23  8:32 AM  Result Value Ref Range   Glucose Fasting, POC 156 (A) 70 - 99 mg/dL  TSH     Status: None   Collection Time: 08/06/23 10:59 AM  Result Value Ref Range   TSH 1.330 0.450 - 4.500 uIU/mL  Comprehensive metabolic panel     Status: Abnormal   Collection Time: 08/06/23 10:59 AM  Result Value Ref Range   Glucose 103 (H) 70 - 99 mg/dL   BUN 17 8 - 27 mg/dL   Creatinine, Ser 1.88 (H) 0.57 - 1.00 mg/dL   eGFR 45 (L) >41 YS/AYT/0.16   BUN/Creatinine Ratio 13 12 - 28   Sodium 140 134 - 144 mmol/L   Potassium 4.7 3.5 - 5.2 mmol/L   Chloride 107 (H) 96 - 106 mmol/L   CO2 16 (L) 20 - 29 mmol/L   Calcium 9.0 8.7 - 10.3 mg/dL   Total Protein 6.7 6.0 - 8.5 g/dL   Albumin 3.4 (L) 3.9 - 4.9 g/dL   Globulin, Total 3.3 1.5 - 4.5 g/dL   Bilirubin Total 0.4 0.0 - 1.2 mg/dL   Alkaline Phosphatase 151 (H) 44 - 121 IU/L   AST 24 0 - 40 IU/L   ALT 33 (H) 0 - 32 IU/L  Lipid panel     Status: None   Collection Time: 08/06/23 10:59 AM  Result Value Ref Range   Cholesterol, Total 150 100 - 199 mg/dL   Triglycerides 73 0 - 149 mg/dL   HDL 53 >01 mg/dL   VLDL Cholesterol Cal 14  5 - 40 mg/dL   LDL Chol Calc (NIH) 83 0 - 99 mg/dL   Chol/HDL Ratio 2.8 0.0 - 4.4 ratio    Comment:                                   T. Chol/HDL Ratio                                             Men  Women                               1/2 Avg.Risk  3.4    3.3                                   Avg.Risk  5.0    4.4  2X Avg.Risk  9.6    7.1                                3X Avg.Risk 23.4   11.0   Hemoglobin A1c     Status: Abnormal   Collection Time: 08/06/23 10:59 AM  Result Value Ref Range   Hgb A1c MFr Bld 6.6 (H) 4.8 - 5.6 %    Comment:          Prediabetes: 5.7 - 6.4          Diabetes: >6.4          Glycemic control for adults with diabetes: <7.0    Est. average glucose Bld gHb Est-mCnc 143 mg/dL  SARS Coronavirus 2 by RT PCR (hospital order, performed in [facility] hospital lab) *cepheid single result test* Anterior Nasal Swab     Status: None   Collection Time: 08/15/23 11:07 AM   Specimen: Anterior Nasal Swab  Result Value Ref Range   SARS Coronavirus 2 by RT PCR NEGATIVE NEGATIVE    Comment: (NOTE) SARS-CoV-2 target nucleic acids are NOT DETECTED.  The SARS-CoV-2 RNA is generally detectable in upper and lower respiratory specimens during the acute phase of infection. The lowest concentration of SARS-CoV-2 viral copies this assay can detect is 250 copies / mL. A negative result does not preclude SARS-CoV-2 infection and should not be used as the sole basis for treatment or other patient management decisions.  A negative result may occur with improper specimen collection / handling, submission of specimen other than nasopharyngeal swab, presence of viral mutation(Laresa Oshiro) within the areas targeted by this assay, and inadequate number of viral copies (<250 copies / mL). A negative result must be combined with clinical observations, patient history, and epidemiological information.  Fact Sheet for Patients:    RoadLapTop.co.za  Fact Sheet for Healthcare Providers: http://kim-miller.com/  This test is not yet approved or  cleared by the [place] FDA and has been authorized for detection and/or diagnosis of SARS-CoV-2 by FDA under an Emergency Use Authorization (EUA).  This EUA will remain in effect (meaning this test can be used) for the duration of the COVID-19 declaration under Section 564(b)(1) of the Act, 21 [place].C. section 360bbb-3(b)(1), unless the authorization is terminated or revoked sooner.  Performed at [facility], [address] [place], [place] [address]   Basic metabolic panel     Status: Abnormal   Collection Time: 08/15/23 11:49 AM  Result Value Ref Range   Sodium 140 135 - 145 mmol/L   Potassium 3.9 3.5 - 5.1 mmol/L   Chloride 111 98 - 111 mmol/L   CO2 23 22 - 32 mmol/L   Glucose, Bld 145 (H) 70 - 99 mg/dL    Comment: Glucose reference range applies only to samples taken after fasting for at least 8 hours.   BUN 27 (H) 8 - 23 mg/dL   Creatinine, Ser 6.04 (H) 0.44 - 1.00 mg/dL   Calcium 8.5 (L) 8.9 - 10.3 mg/dL   GFR, Estimated 46 (L) >60 mL/min    Comment: (NOTE) Calculated using the CKD-EPI Creatinine Equation (2021)    Anion gap 6 5 - 15    Comment: Performed at [facility], [address] [place], [place] [address]  CBC     Status: Abnormal   Collection Time: 08/15/23 11:49 AM  Result Value Ref Range   WBC 8.2 4.0 - 10.5 K/uL  RBC 4.71 3.87 - 5.11 MIL/uL   Hemoglobin 12.8 12.0 - 15.0 g/dL   HCT 16.1 09.6 - 04.5 %   MCV 87.9 80.0 - 100.0 fL   MCH 27.2 26.0 - 34.0 pg   MCHC 30.9 30.0 - 36.0 g/dL   RDW 40.9 (H) 81.1 - 91.4 %   Platelets 334 150 - 400 K/uL   nRBC 0.0 0.0 - 0.2 %    Comment: Performed at [facility], [address], [place], [place] [address]  Troponin I (High Sensitivity)     Status: None   Collection Time: 08/15/23 11:49 AM  Result Value Ref Range    Troponin I (High Sensitivity) 4 <18 ng/L    Comment: (NOTE) Elevated high sensitivity troponin I (hsTnI) values and significant  changes across serial measurements may suggest ACS but many other  chronic and acute conditions are known to elevate hsTnI results.  Refer to the "Links" section for chest pain algorithms and additional  guidance. Performed at [facility], [address] [place], [place] [address]       Assessment & Plan:   ASSESSMENT:  viral upper respiratory illness  PLAN: Symptomatic therapy suggested: push fluids, rest, and return office visit prn if symptoms persist or worsen. Lack of antibiotic effectiveness discussed with her. Call or return to clinic prn if these symptoms worsen or fail to improve as anticipated.  Problem List Items Addressed This Visit   None Visit Diagnoses     URI, acute    -  Primary   Relevant Orders   POCT XPERT XPRESS SARS COVID-2/FLU/RSV [[id number]]       No follow-ups on file.   Total time spent: 20 minutes  Luna Fuse, MD  08/17/2023   This document may have been prepared by [facility] Voice Recognition software and as such may include unintentional dictation errors.

## 2023-08-20 ENCOUNTER — Telehealth: Payer: Self-pay

## 2023-08-21 ENCOUNTER — Telehealth: Payer: Self-pay

## 2023-08-21 ENCOUNTER — Other Ambulatory Visit: Payer: Self-pay

## 2023-08-21 LAB — POCT XPERT XPRESS SARS COVID-2/FLU/RSV
FLU A: NEGATIVE
FLU B: NEGATIVE
RSV RNA, PCR: NEGATIVE
SARS Coronavirus 2: NEGATIVE

## 2023-08-21 NOTE — Telephone Encounter (Signed)
Pt LM asking for call back

## 2023-08-21 NOTE — Telephone Encounter (Signed)
Patient notified about negative COVID results.

## 2023-08-22 ENCOUNTER — Other Ambulatory Visit: Payer: Self-pay | Admitting: Internal Medicine

## 2023-08-22 NOTE — Telephone Encounter (Signed)
Patient notified about lab results.

## 2023-09-03 ENCOUNTER — Other Ambulatory Visit: Payer: Self-pay | Admitting: Internal Medicine

## 2023-09-03 DIAGNOSIS — E669 Obesity, unspecified: Secondary | ICD-10-CM

## 2023-09-03 DIAGNOSIS — Z794 Long term (current) use of insulin: Secondary | ICD-10-CM

## 2023-09-04 DIAGNOSIS — R0902 Hypoxemia: Secondary | ICD-10-CM | POA: Diagnosis not present

## 2023-09-05 ENCOUNTER — Other Ambulatory Visit: Payer: Self-pay

## 2023-09-06 ENCOUNTER — Other Ambulatory Visit: Payer: Self-pay

## 2023-09-10 ENCOUNTER — Encounter: Payer: Self-pay | Admitting: Internal Medicine

## 2023-09-10 ENCOUNTER — Ambulatory Visit (INDEPENDENT_AMBULATORY_CARE_PROVIDER_SITE_OTHER): Payer: 59 | Admitting: Internal Medicine

## 2023-09-10 VITALS — BP 142/73 | HR 115 | Ht 67.0 in | Wt 257.4 lb

## 2023-09-10 DIAGNOSIS — N1831 Chronic kidney disease, stage 3a: Secondary | ICD-10-CM | POA: Diagnosis not present

## 2023-09-10 DIAGNOSIS — G8929 Other chronic pain: Secondary | ICD-10-CM | POA: Diagnosis not present

## 2023-09-10 DIAGNOSIS — Z794 Long term (current) use of insulin: Secondary | ICD-10-CM | POA: Diagnosis not present

## 2023-09-10 DIAGNOSIS — F5101 Primary insomnia: Secondary | ICD-10-CM | POA: Diagnosis not present

## 2023-09-10 DIAGNOSIS — E1122 Type 2 diabetes mellitus with diabetic chronic kidney disease: Secondary | ICD-10-CM

## 2023-09-10 DIAGNOSIS — I10 Essential (primary) hypertension: Secondary | ICD-10-CM

## 2023-09-10 LAB — POCT CBG (FASTING - GLUCOSE)-MANUAL ENTRY: Glucose Fasting, POC: 151 mg/dL — AB (ref 70–99)

## 2023-09-10 MED ORDER — OXYCODONE HCL 20 MG PO TABS
20.0000 mg | ORAL_TABLET | Freq: Four times a day (QID) | ORAL | 0 refills | Status: DC
Start: 2023-09-10 — End: 2023-10-10

## 2023-09-10 NOTE — Progress Notes (Signed)
Established Patient Office Visit  Subjective:  Patient ID: Lindsey [name], female    DOB: July 02, 1961  Age: 62 y.o. MRN: [id number]  No chief complaint on file.   Here for pain management follow up. Chronic pain well controlled on current analgesia. Last UDS satisfactory and pill counts have also been satisfactory. C/o insomnia.    No other concerns at this time.   Past Medical History:  Diagnosis Date   Allergy    Anemia    Arthritis    knees   CHF (congestive heart failure) (HCC)    COPD (chronic obstructive pulmonary disease) (HCC)    Diabetes mellitus without complication (HCC)    diet controlled   GERD (gastroesophageal reflux disease)    Heart attack (HCC) 2002   Hyperlipidemia    Hypertension    PUD (peptic ulcer disease)    Sleep apnea    can't tolerate CPAP.  Uses O2 only   Spinal cord stimulator status    for lower back pain   Stroke [facility]) 2004   "mini-stroke"   Vertigo    Wears dentures    full upper and lower    Past Surgical History:  Procedure Laterality Date   ABDOMINAL HYSTERECTOMY     back surgery     broken wrist     COLONOSCOPY WITH PROPOFOL N/A 01/30/2017   Procedure: COLONOSCOPY WITH PROPOFOL;  Surgeon: Wyline Mood, MD;  Location: [facility] ENDOSCOPY;  Service: Endoscopy;  Laterality: N/A;   COLONOSCOPY WITH PROPOFOL N/A 02/20/2017   Procedure: Colonoscopy with propofol ;  Surgeon: Wyline Mood, MD;  Location: [facility] ENDOSCOPY;  Service: Endoscopy;  Laterality: N/A;   COLONOSCOPY WITH PROPOFOL N/A 03/13/2017   Procedure: COLONOSCOPY WITH PROPOFOL;  Surgeon: Wyline Mood, MD;  Location: [facility] ENDOSCOPY;  Service: Endoscopy;  Laterality: N/A;   COLONOSCOPY WITH PROPOFOL N/A 04/17/2017   Procedure: COLONOSCOPY WITH PROPOFOL;  Surgeon: Wyline Mood, MD;  Location: [facility] ENDOSCOPY;  Service: Endoscopy;  Laterality: N/A;   ESOPHAGOGASTRODUODENOSCOPY N/A 05/05/2020   Procedure: ESOPHAGOGASTRODUODENOSCOPY (EGD);  Surgeon: Toledo, Boykin Nearing, MD;  Location: [facility]  ENDOSCOPY;  Service: Gastroenterology;  Laterality: N/A;   ESOPHAGOGASTRODUODENOSCOPY (EGD) WITH PROPOFOL N/A 09/28/2016   Procedure: ESOPHAGOGASTRODUODENOSCOPY (EGD) WITH PROPOFOL;  Surgeon: Scot Jun, MD;  Location: [facility] ENDOSCOPY;  Service: Endoscopy;  Laterality: N/A;   ESOPHAGOGASTRODUODENOSCOPY (EGD) WITH PROPOFOL N/A 03/13/2017   Procedure: ESOPHAGOGASTRODUODENOSCOPY (EGD) WITH PROPOFOL;  Surgeon: Wyline Mood, MD;  Location: [facility] ENDOSCOPY;  Service: Endoscopy;  Laterality: N/A;   GALLBLADDER SURGERY     GASTRIC BYPASS     GIVENS CAPSULE STUDY N/A 03/13/2017   Procedure: GIVENS CAPSULE STUDY;  Surgeon: Wyline Mood, MD;  Location: [facility] ENDOSCOPY;  Service: Endoscopy;  Laterality: N/A;   HERNIA REPAIR     JOINT REPLACEMENT  1995   REPLACEMENT TOTAL KNEE     ROTATOR CUFF REPAIR      Social History   Socioeconomic History   Marital status: Widowed    Spouse name: Not on file   Number of children: Not on file   Years of education: Not on file   Highest education level: Not on file  Occupational History   Occupation: disabled  Tobacco Use   Smoking status: Never   Smokeless tobacco: Never  Vaping Use   Vaping status: Never Used  Substance and Sexual Activity   Alcohol use: No    Alcohol/week: 0.0 standard drinks of alcohol   Drug use: No   Sexual activity: Yes  Other Topics Concern  Not on file  Social History Narrative   Not on file   Social Determinants of Health   Financial Resource Strain: Low Risk  (07/20/2022)   [facility], [facility]   Overall Financial Resource Strain (CARDIA)    Difficulty of Paying Living Expenses: Not hard at all  Food Insecurity: No Food Insecurity (07/20/2022)   Received from [facility], [facility]   Hunger Vital Sign    Worried About Running Out of Food in the Last Year: Never true    Ran Out of Food in the Last Year: Never true  Transportation Needs: No Transportation Needs (07/20/2022)   Received  from [facility], [facility]   [facility] - Transportation    Lack of Transportation (Medical): No    Lack of Transportation (Non-Medical): No  Physical Activity: Not on file  Stress: Not on file  Social Connections: Not on file  Intimate Partner Violence: Not on file    Family History  Problem Relation Age of Onset   Hypertension Father    COPD Father    Heart disease Father    Breast cancer Neg Hx     Allergies  Allergen Reactions   Morphine Hives   Iodinated Contrast Media Hives   Aspirin Hives    Noted on MD progress notes and discussed with MD 09/02/15   Etodolac Hives   Ibuprofen Hives   Shellfish Allergy Hives   Tylenol [Acetaminophen] Hives    Upset stomach     Review of Systems  HENT: Negative.    Eyes: Negative.   Respiratory:  Positive for cough.   Cardiovascular: Negative.   Gastrointestinal: Negative.   Genitourinary: Negative.   Skin: Negative.   Neurological: Negative.   Endo/Heme/Allergies: Negative.   Psychiatric/Behavioral:  The patient has insomnia.        Objective:   BP (!) 142/73   Pulse (!) 115   Ht 5\' 7"  (1.702 m)   Wt 257 lb 6.4 oz (116.8 kg)   SpO2 97%   BMI 40.31 kg/m   Vitals:   09/10/23 0947  BP: (!) 142/73  Pulse: (!) 115  Height: 5\' 7"  (1.702 m)  Weight: 257 lb 6.4 oz (116.8 kg)  SpO2: 97%  BMI (Calculated): 40.31    Physical Exam Vitals reviewed.  Constitutional:      General: She is not in acute distress.    Appearance: She is obese.  HENT:     Head: Normocephalic.     Nose: Nose normal.     Mouth/Throat:     Mouth: Mucous membranes are moist.  Eyes:     Extraocular Movements: Extraocular movements intact.     Pupils: Pupils are equal, round, and reactive to light.  Cardiovascular:     Rate and Rhythm: Normal rate and regular rhythm.     Heart sounds: No murmur heard. Pulmonary:     Effort: Pulmonary effort is normal.     Breath sounds: No rhonchi or rales.  Abdominal:     General: Abdomen is  flat.     Palpations: There is no hepatomegaly, splenomegaly or mass.  Musculoskeletal:        General: Normal range of motion.     Cervical back: Normal range of motion. No tenderness.  Skin:    General: Skin is warm and dry.  Neurological:     General: No focal deficit present.     Mental Status: She is alert and oriented to  person, place, and time.     Cranial Nerves: No cranial nerve deficit.     Motor: No weakness.  Psychiatric:        Mood and Affect: Mood normal.        Behavior: Behavior normal.      Results for orders placed or performed in visit on 09/10/23  POCT CBG (Fasting - Glucose)  Result Value Ref Range   Glucose Fasting, POC 151 (A) 70 - 99 mg/dL       Assessment & Plan:  As per problem list  Problem List Items Addressed This Visit       Endocrine   Type II diabetes mellitus with renal manifestations (HCC) - Primary   Relevant Orders   POCT CBG (Fasting - Glucose) (Completed)     Other   Other chronic pain   Relevant Medications   Oxycodone HCl 20 MG TABS   Primary insomnia   Relevant Orders   Ambulatory referral to Psychiatry    Return in about 1 month (around 10/11/2023).   Total time spent: 20 minutes  Luna Fuse, MD  09/10/2023   This document may have been prepared by [facility] Voice Recognition software and as such may include unintentional dictation errors.

## 2023-09-18 ENCOUNTER — Telehealth: Payer: Self-pay

## 2023-09-18 NOTE — Patient Outreach (Signed)
[facility] Assistant attempted to call patient on today regarding preventative mammogram screening. No answer from patient after multiple rings. Assistant left confidential voicemail for patient to return call.  Will call back patient back for final attempt.  Baruch Gouty [facility] Assistant [facility] [phone]

## 2023-09-20 ENCOUNTER — Telehealth: Payer: Self-pay

## 2023-09-20 ENCOUNTER — Other Ambulatory Visit: Payer: Self-pay

## 2023-09-20 ENCOUNTER — Other Ambulatory Visit: Payer: Self-pay | Admitting: Internal Medicine

## 2023-09-20 NOTE — Telephone Encounter (Signed)
Patient called requesting refill for xanax and was informed that medication request has already been submitted and provider will respond once back in office.

## 2023-09-21 ENCOUNTER — Other Ambulatory Visit: Payer: Self-pay | Admitting: Internal Medicine

## 2023-10-03 ENCOUNTER — Ambulatory Visit: Payer: 59 | Admitting: Internal Medicine

## 2023-10-03 ENCOUNTER — Other Ambulatory Visit: Payer: Self-pay | Admitting: Internal Medicine

## 2023-10-03 DIAGNOSIS — I639 Cerebral infarction, unspecified: Secondary | ICD-10-CM

## 2023-10-05 DIAGNOSIS — R0902 Hypoxemia: Secondary | ICD-10-CM | POA: Diagnosis not present

## 2023-10-10 ENCOUNTER — Ambulatory Visit (INDEPENDENT_AMBULATORY_CARE_PROVIDER_SITE_OTHER): Payer: 59 | Admitting: Internal Medicine

## 2023-10-10 ENCOUNTER — Encounter: Payer: Self-pay | Admitting: Internal Medicine

## 2023-10-10 VITALS — BP 110/86 | HR 88 | Ht 67.0 in | Wt 258.2 lb

## 2023-10-10 DIAGNOSIS — J209 Acute bronchitis, unspecified: Secondary | ICD-10-CM

## 2023-10-10 DIAGNOSIS — I10 Essential (primary) hypertension: Secondary | ICD-10-CM

## 2023-10-10 DIAGNOSIS — E1122 Type 2 diabetes mellitus with diabetic chronic kidney disease: Secondary | ICD-10-CM | POA: Diagnosis not present

## 2023-10-10 DIAGNOSIS — N1831 Chronic kidney disease, stage 3a: Secondary | ICD-10-CM | POA: Diagnosis not present

## 2023-10-10 DIAGNOSIS — G4489 Other headache syndrome: Secondary | ICD-10-CM

## 2023-10-10 DIAGNOSIS — Z794 Long term (current) use of insulin: Secondary | ICD-10-CM | POA: Diagnosis not present

## 2023-10-10 DIAGNOSIS — G8929 Other chronic pain: Secondary | ICD-10-CM

## 2023-10-10 LAB — POCT CBG (FASTING - GLUCOSE)-MANUAL ENTRY: Glucose Fasting, POC: 116 mg/dL — AB (ref 70–99)

## 2023-10-10 MED ORDER — NALOXONE HCL 4 MG/0.1ML NA LIQD
NASAL | 0 refills | Status: DC
Start: 2023-10-10 — End: 2024-09-03

## 2023-10-10 MED ORDER — POTASSIUM CHLORIDE ER 10 MEQ PO TBCR
10.0000 meq | EXTENDED_RELEASE_TABLET | Freq: Every day | ORAL | 1 refills | Status: DC
Start: 2023-10-10 — End: 2024-01-22

## 2023-10-10 MED ORDER — TRESIBA FLEXTOUCH 200 UNIT/ML ~~LOC~~ SOPN
28.0000 [IU] | PEN_INJECTOR | Freq: Every morning | SUBCUTANEOUS | 3 refills | Status: DC
Start: 2023-10-10 — End: 2024-10-17

## 2023-10-10 MED ORDER — UBRELVY 100 MG PO TABS: 100.0000 mg | ORAL_TABLET | ORAL | 3 refills | Status: AC | PRN

## 2023-10-10 MED ORDER — OXYCODONE HCL 20 MG PO TABS: 20.0000 mg | ORAL_TABLET | Freq: Four times a day (QID) | ORAL | 0 refills | Status: DC

## 2023-10-10 MED ORDER — ALBUTEROL SULFATE HFA 108 (90 BASE) MCG/ACT IN AERS
2.0000 | INHALATION_SPRAY | RESPIRATORY_TRACT | 3 refills | Status: DC | PRN
Start: 2023-10-10 — End: 2024-07-07

## 2023-10-10 MED ORDER — EMPAGLIFLOZIN 25 MG PO TABS: 25.0000 mg | ORAL_TABLET | Freq: Every day | ORAL | 1 refills | Status: DC

## 2023-10-10 NOTE — Progress Notes (Signed)
Established Patient Office Visit  Subjective:  Patient ID: Lindsey [name], female    DOB: 11-Oct-1961  Age: 62 y.o. MRN: [id number]  Chief Complaint  Patient presents with   Pain Management    1 month pain management    Here for pain management follow up. Chronic pain well controlled on current analgesia. Last UDS satisfactory and pill counts have also been satisfactory.   No other concerns at this time.   Past Medical History:  Diagnosis Date   Allergy    Anemia    Arthritis    knees   CHF (congestive heart failure) (HCC)    COPD (chronic obstructive pulmonary disease) (HCC)    Diabetes mellitus without complication (HCC)    diet controlled   GERD (gastroesophageal reflux disease)    Heart attack (HCC) 2002   Hyperlipidemia    Hypertension    PUD (peptic ulcer disease)    Sleep apnea    can't tolerate CPAP.  Uses O2 only   Spinal cord stimulator status    for lower back pain   Stroke [facility]) 2004   "mini-stroke"   Vertigo    Wears dentures    full upper and lower    Past Surgical History:  Procedure Laterality Date   ABDOMINAL HYSTERECTOMY     back surgery     broken wrist     COLONOSCOPY WITH PROPOFOL N/A 01/30/2017   Procedure: COLONOSCOPY WITH PROPOFOL;  Surgeon: Wyline Mood, MD;  Location: [facility] ENDOSCOPY;  Service: Endoscopy;  Laterality: N/A;   COLONOSCOPY WITH PROPOFOL N/A 02/20/2017   Procedure: Colonoscopy with propofol ;  Surgeon: Wyline Mood, MD;  Location: [facility] ENDOSCOPY;  Service: Endoscopy;  Laterality: N/A;   COLONOSCOPY WITH PROPOFOL N/A 03/13/2017   Procedure: COLONOSCOPY WITH PROPOFOL;  Surgeon: Wyline Mood, MD;  Location: [facility] ENDOSCOPY;  Service: Endoscopy;  Laterality: N/A;   COLONOSCOPY WITH PROPOFOL N/A 04/17/2017   Procedure: COLONOSCOPY WITH PROPOFOL;  Surgeon: Wyline Mood, MD;  Location: [facility] ENDOSCOPY;  Service: Endoscopy;  Laterality: N/A;   ESOPHAGOGASTRODUODENOSCOPY N/A 05/05/2020   Procedure: ESOPHAGOGASTRODUODENOSCOPY (EGD);  Surgeon:  Toledo, Boykin Nearing, MD;  Location: [facility] ENDOSCOPY;  Service: Gastroenterology;  Laterality: N/A;   ESOPHAGOGASTRODUODENOSCOPY (EGD) WITH PROPOFOL N/A 09/28/2016   Procedure: ESOPHAGOGASTRODUODENOSCOPY (EGD) WITH PROPOFOL;  Surgeon: Scot Jun, MD;  Location: [facility] ENDOSCOPY;  Service: Endoscopy;  Laterality: N/A;   ESOPHAGOGASTRODUODENOSCOPY (EGD) WITH PROPOFOL N/A 03/13/2017   Procedure: ESOPHAGOGASTRODUODENOSCOPY (EGD) WITH PROPOFOL;  Surgeon: Wyline Mood, MD;  Location: [facility] ENDOSCOPY;  Service: Endoscopy;  Laterality: N/A;   GALLBLADDER SURGERY     GASTRIC BYPASS     GIVENS CAPSULE STUDY N/A 03/13/2017   Procedure: GIVENS CAPSULE STUDY;  Surgeon: Wyline Mood, MD;  Location: [facility] ENDOSCOPY;  Service: Endoscopy;  Laterality: N/A;   HERNIA REPAIR     JOINT REPLACEMENT  1995   REPLACEMENT TOTAL KNEE     ROTATOR CUFF REPAIR      Social History   Socioeconomic History   Marital status: Widowed    Spouse name: Not on file   Number of children: Not on file   Years of education: Not on file   Highest education level: Not on file  Occupational History   Occupation: disabled  Tobacco Use   Smoking status: Never   Smokeless tobacco: Never  Vaping Use   Vaping status: Never Used  Substance and Sexual Activity   Alcohol use: No    Alcohol/week: 0.0 standard drinks of alcohol   Drug use: No  Sexual activity: Yes  Other Topics Concern   Not on file  Social History Narrative   Not on file   Social Determinants of Health   Financial Resource Strain: Low Risk  (07/20/2022)   Received from [facility], [facility]   Overall Financial Resource Strain (CARDIA)    Difficulty of Paying Living Expenses: Not hard at all  Food Insecurity: No Food Insecurity (07/20/2022)   Received from [facility], [facility]   Hunger Vital Sign    Worried About Running Out of Food in the Last Year: Never true    Ran Out of Food in the Last Year: Never true  Transportation Needs: No  Transportation Needs (07/20/2022)   Received from [facility], [facility]   [facility] - Transportation    Lack of Transportation (Medical): No    Lack of Transportation (Non-Medical): No  Physical Activity: Not on file  Stress: Not on file  Social Connections: Not on file  Intimate Partner Violence: Not on file    Family History  Problem Relation Age of Onset   Hypertension Father    COPD Father    Heart disease Father    Breast cancer Neg Hx     Allergies  Allergen Reactions   Morphine Hives   Iodinated Contrast Media Hives   Aspirin Hives    Noted on MD progress notes and discussed with MD 09/02/15   Etodolac Hives   Ibuprofen Hives   Shellfish Allergy Hives   Tylenol [Acetaminophen] Hives    Upset stomach     Review of Systems  HENT: Negative.    Eyes: Negative.   Respiratory:  Positive for cough.   Cardiovascular: Negative.   Gastrointestinal: Negative.   Genitourinary: Negative.   Skin: Negative.   Neurological: Negative.   Endo/Heme/Allergies: Negative.   Psychiatric/Behavioral:  The patient has insomnia.        Objective:   BP 110/86   Pulse 88   Ht 5\' 7"  (1.702 m)   Wt 258 lb 3.2 oz (117.1 kg)   SpO2 98%   BMI 40.44 kg/m   Vitals:   10/10/23 0846  BP: 110/86  Pulse: 88  Height: 5\' 7"  (1.702 m)  Weight: 258 lb 3.2 oz (117.1 kg)  SpO2: 98%  BMI (Calculated): 40.43    Physical Exam Vitals reviewed.  Constitutional:      General: She is not in acute distress.    Appearance: She is obese.  HENT:     Head: Normocephalic.     Nose: Nose normal.     Mouth/Throat:     Mouth: Mucous membranes are moist.  Eyes:     Extraocular Movements: Extraocular movements intact.     Pupils: Pupils are equal, round, and reactive to light.  Cardiovascular:     Rate and Rhythm: Normal rate and regular rhythm.     Heart sounds: No murmur heard. Pulmonary:     Effort: Pulmonary effort is normal.     Breath sounds: No rhonchi or rales.  Abdominal:      General: Abdomen is flat.     Palpations: There is no hepatomegaly, splenomegaly or mass.  Musculoskeletal:        General: Normal range of motion.     Cervical back: Normal range of motion. No tenderness.  Skin:    General: Skin is warm and dry.  Neurological:     General: No focal deficit present.     Mental Status: She  is alert and oriented to person, place, and time.     Cranial Nerves: No cranial nerve deficit.     Motor: No weakness.  Psychiatric:        Mood and Affect: Mood normal.        Behavior: Behavior normal.      Results for orders placed or performed in visit on 10/10/23  POCT CBG (Fasting - Glucose)  Result Value Ref Range   Glucose Fasting, POC 116 (A) 70 - 99 mg/dL    Recent Results (from the past 2160 hour(Wille Aubuchon))  TSH     Status: None   Collection Time: 08/06/23 10:59 AM  Result Value Ref Range   TSH 1.330 0.450 - 4.500 uIU/mL  Comprehensive metabolic panel     Status: Abnormal   Collection Time: 08/06/23 10:59 AM  Result Value Ref Range   Glucose 103 (H) 70 - 99 mg/dL   BUN 17 8 - 27 mg/dL   Creatinine, Ser 4.09 (H) 0.57 - 1.00 mg/dL   eGFR 45 (L) >81 XB/JYN/8.29   BUN/Creatinine Ratio 13 12 - 28   Sodium 140 134 - 144 mmol/L   Potassium 4.7 3.5 - 5.2 mmol/L   Chloride 107 (H) 96 - 106 mmol/L   CO2 16 (L) 20 - 29 mmol/L   Calcium 9.0 8.7 - 10.3 mg/dL   Total Protein 6.7 6.0 - 8.5 g/dL   Albumin 3.4 (L) 3.9 - 4.9 g/dL   Globulin, Total 3.3 1.5 - 4.5 g/dL   Bilirubin Total 0.4 0.0 - 1.2 mg/dL   Alkaline Phosphatase 151 (H) 44 - 121 IU/L   AST 24 0 - 40 IU/L   ALT 33 (H) 0 - 32 IU/L  Lipid panel     Status: None   Collection Time: 08/06/23 10:59 AM  Result Value Ref Range   Cholesterol, Total 150 100 - 199 mg/dL   Triglycerides 73 0 - 149 mg/dL   HDL 53 >56 mg/dL   VLDL Cholesterol Cal 14 5 - 40 mg/dL   LDL Chol Calc (NIH) 83 0 - 99 mg/dL   Chol/HDL Ratio 2.8 0.0 - 4.4 ratio    Comment:                                   T. Chol/HDL  Ratio                                             Men  Women                               1/2 Avg.Risk  3.4    3.3                                   Avg.Risk  5.0    4.4                                2X Avg.Risk  9.6    7.1  3X Avg.Risk 23.4   11.0   Hemoglobin A1c     Status: Abnormal   Collection Time: 08/06/23 10:59 AM  Result Value Ref Range   Hgb A1c MFr Bld 6.6 (H) 4.8 - 5.6 %    Comment:          Prediabetes: 5.7 - 6.4          Diabetes: >6.4          Glycemic control for adults with diabetes: <7.0    Est. average glucose Bld gHb Est-mCnc 143 mg/dL  SARS Coronavirus 2 by RT PCR (hospital order, performed in [facility] hospital lab) *cepheid single result test* Anterior Nasal Swab     Status: None   Collection Time: 08/15/23 11:07 AM   Specimen: Anterior Nasal Swab  Result Value Ref Range   SARS Coronavirus 2 by RT PCR NEGATIVE NEGATIVE    Comment: (NOTE) SARS-CoV-2 target nucleic acids are NOT DETECTED.  The SARS-CoV-2 RNA is generally detectable in upper and lower respiratory specimens during the acute phase of infection. The lowest concentration of SARS-CoV-2 viral copies this assay can detect is 250 copies / mL. A negative result does not preclude SARS-CoV-2 infection and should not be used as the sole basis for treatment or other patient management decisions.  A negative result may occur with improper specimen collection / handling, submission of specimen other than nasopharyngeal swab, presence of viral mutation(Cadence Minton) within the areas targeted by this assay, and inadequate number of viral copies (<250 copies / mL). A negative result must be combined with clinical observations, patient history, and epidemiological information.  Fact Sheet for Patients:   RoadLapTop.co.za  Fact Sheet for Healthcare Providers: http://kim-miller.com/  This test is not yet approved or  cleared by the [place] FDA and has been authorized for detection and/or diagnosis of SARS-CoV-2 by FDA under an Emergency Use Authorization (EUA).  This EUA will remain in effect (meaning this test can be used) for the duration of the COVID-19 declaration under Section 564(b)(1) of the Act, 21 [place].C. section 360bbb-3(b)(1), unless the authorization is terminated or revoked sooner.  Performed at [facility], [address] [place], [place] [address]   Basic metabolic panel     Status: Abnormal   Collection Time: 08/15/23 11:49 AM  Result Value Ref Range   Sodium 140 135 - 145 mmol/L   Potassium 3.9 3.5 - 5.1 mmol/L   Chloride 111 98 - 111 mmol/L   CO2 23 22 - 32 mmol/L   Glucose, Bld 145 (H) 70 - 99 mg/dL    Comment: Glucose reference range applies only to samples taken after fasting for at least 8 hours.   BUN 27 (H) 8 - 23 mg/dL   Creatinine, Ser 2.95 (H) 0.44 - 1.00 mg/dL   Calcium 8.5 (L) 8.9 - 10.3 mg/dL   GFR, Estimated 46 (L) >60 mL/min    Comment: (NOTE) Calculated using the CKD-EPI Creatinine Equation (2021)    Anion gap 6 5 - 15    Comment: Performed at [facility], [address] [place], [place] [address]  CBC     Status: Abnormal   Collection Time: 08/15/23 11:49 AM  Result Value Ref Range   WBC 8.2 4.0 - 10.5 K/uL   RBC 4.71 3.87 - 5.11 MIL/uL   Hemoglobin 12.8 12.0 - 15.0 g/dL   HCT 24.4 01.0 - 27.2 %   MCV 87.9 80.0 - 100.0 fL   MCH 27.2 26.0 - 34.0  pg   MCHC 30.9 30.0 - 36.0 g/dL   RDW 24.4 (H) 01.0 - 27.2 %   Platelets 334 150 - 400 K/uL   nRBC 0.0 0.0 - 0.2 %    Comment: Performed at [facility], [address] [place], [place] [address]  Troponin I (High Sensitivity)     Status: None   Collection Time: 08/15/23 11:49 AM  Result Value Ref Range   Troponin I (High Sensitivity) 4 <18 ng/L    Comment: (NOTE) Elevated high sensitivity troponin I (hsTnI) values and significant  changes across serial measurements may suggest ACS but  many other  chronic and acute conditions are known to elevate hsTnI results.  Refer to the "Links" section for chest pain algorithms and additional  guidance. Performed at [facility], [address] [place], [place] [address]   POCT XPERT XPRESS SARS COVID-2/FLU/RSV [[id number]]     Status: Normal   Collection Time: 08/21/23  4:32 PM  Result Value Ref Range   SARS Coronavirus 2 Negative    FLU A Negative    FLU B Negative    RSV RNA, PCR Negative   POCT CBG (Fasting - Glucose)     Status: Abnormal   Collection Time: 09/10/23  9:53 AM  Result Value Ref Range   Glucose Fasting, POC 151 (A) 70 - 99 mg/dL  POCT CBG (Fasting - Glucose)     Status: Abnormal   Collection Time: 10/10/23  8:52 AM  Result Value Ref Range   Glucose Fasting, POC 116 (A) 70 - 99 mg/dL      Assessment & Plan:   Problem List Items Addressed This Visit       Endocrine   Type II diabetes mellitus with renal manifestations (HCC) - Primary   Relevant Orders   POCT CBG (Fasting - Glucose) (Completed)    No follow-ups on file.   Total time spent: 20 minutes  Luna Fuse, MD  10/10/2023   This document may have been prepared by [facility] Voice Recognition software and as such may include unintentional dictation errors.

## 2023-10-21 ENCOUNTER — Other Ambulatory Visit: Payer: Self-pay | Admitting: Internal Medicine

## 2023-10-22 ENCOUNTER — Other Ambulatory Visit: Payer: Self-pay | Admitting: Internal Medicine

## 2023-10-22 DIAGNOSIS — F5101 Primary insomnia: Secondary | ICD-10-CM

## 2023-10-23 ENCOUNTER — Telehealth: Payer: Self-pay | Admitting: Internal Medicine

## 2023-10-23 NOTE — Telephone Encounter (Signed)
Patient called in stating Belsomra is on backorder. Pharmacy doesn't know when they will get any in. Can we send in an alternative?  Karin Golden - [place]

## 2023-10-24 ENCOUNTER — Ambulatory Visit: Payer: 59 | Admitting: Internal Medicine

## 2023-10-29 ENCOUNTER — Ambulatory Visit: Payer: 59 | Admitting: Internal Medicine

## 2023-10-29 ENCOUNTER — Encounter: Payer: Self-pay | Admitting: Internal Medicine

## 2023-10-29 VITALS — BP 134/82 | HR 115 | Ht 67.0 in | Wt 253.4 lb

## 2023-10-29 DIAGNOSIS — N1831 Chronic kidney disease, stage 3a: Secondary | ICD-10-CM | POA: Diagnosis not present

## 2023-10-29 DIAGNOSIS — Z1231 Encounter for screening mammogram for malignant neoplasm of breast: Secondary | ICD-10-CM | POA: Diagnosis not present

## 2023-10-29 DIAGNOSIS — E1122 Type 2 diabetes mellitus with diabetic chronic kidney disease: Secondary | ICD-10-CM | POA: Diagnosis not present

## 2023-10-29 DIAGNOSIS — F5101 Primary insomnia: Secondary | ICD-10-CM

## 2023-10-29 DIAGNOSIS — Z0001 Encounter for general adult medical examination with abnormal findings: Secondary | ICD-10-CM | POA: Diagnosis not present

## 2023-10-29 DIAGNOSIS — Z23 Encounter for immunization: Secondary | ICD-10-CM | POA: Diagnosis not present

## 2023-10-29 DIAGNOSIS — Z0130 Encounter for examination of blood pressure without abnormal findings: Secondary | ICD-10-CM

## 2023-10-29 DIAGNOSIS — Z794 Long term (current) use of insulin: Secondary | ICD-10-CM | POA: Diagnosis not present

## 2023-10-29 LAB — POCT CBG (FASTING - GLUCOSE)-MANUAL ENTRY: Glucose Fasting, POC: 124 mg/dL — AB (ref 70–99)

## 2023-10-29 LAB — POC CREATINE & ALBUMIN,URINE
Creatinine, POC: 300 mg/dL
Microalbumin Ur, POC: 150 mg/L

## 2023-10-29 MED ORDER — QUVIVIQ 50 MG PO TABS: 50.0000 mg | ORAL_TABLET | Freq: Every evening | ORAL | 2 refills | Status: DC

## 2023-10-29 NOTE — Progress Notes (Signed)
Established Patient Office Visit  Subjective:  Patient ID: Lindsey [name], female    DOB: 1961/04/07  Age: 62 y.o. MRN: [id number]  No chief complaint on file.   No new complaints, here for AWV refer to quality metrics and scanned documents.   Pain adequately controlled on current Rx. C/o insomnia due to inability to fill Belsomra as it'Adreanna Fickel out of stock.    No other concerns at this time.   Past Medical History:  Diagnosis Date   Allergy    Anemia    Arthritis    knees   CHF (congestive heart failure) (HCC)    COPD (chronic obstructive pulmonary disease) (HCC)    Diabetes mellitus without complication (HCC)    diet controlled   GERD (gastroesophageal reflux disease)    Heart attack (HCC) 2002   Hyperlipidemia    Hypertension    PUD (peptic ulcer disease)    Sleep apnea    can't tolerate CPAP.  Uses O2 only   Spinal cord stimulator status    for lower back pain   Stroke [facility]) 2004   "mini-stroke"   Vertigo    Wears dentures    full upper and lower    Past Surgical History:  Procedure Laterality Date   ABDOMINAL HYSTERECTOMY     back surgery     broken wrist     COLONOSCOPY WITH PROPOFOL N/A 01/30/2017   Procedure: COLONOSCOPY WITH PROPOFOL;  Surgeon: Wyline Mood, MD;  Location: [facility] ENDOSCOPY;  Service: Endoscopy;  Laterality: N/A;   COLONOSCOPY WITH PROPOFOL N/A 02/20/2017   Procedure: Colonoscopy with propofol ;  Surgeon: Wyline Mood, MD;  Location: [facility] ENDOSCOPY;  Service: Endoscopy;  Laterality: N/A;   COLONOSCOPY WITH PROPOFOL N/A 03/13/2017   Procedure: COLONOSCOPY WITH PROPOFOL;  Surgeon: Wyline Mood, MD;  Location: [facility] ENDOSCOPY;  Service: Endoscopy;  Laterality: N/A;   COLONOSCOPY WITH PROPOFOL N/A 04/17/2017   Procedure: COLONOSCOPY WITH PROPOFOL;  Surgeon: Wyline Mood, MD;  Location: [facility] ENDOSCOPY;  Service: Endoscopy;  Laterality: N/A;   ESOPHAGOGASTRODUODENOSCOPY N/A 05/05/2020   Procedure: ESOPHAGOGASTRODUODENOSCOPY (EGD);  Surgeon: Toledo, Boykin Nearing, MD;   Location: [facility] ENDOSCOPY;  Service: Gastroenterology;  Laterality: N/A;   ESOPHAGOGASTRODUODENOSCOPY (EGD) WITH PROPOFOL N/A 09/28/2016   Procedure: ESOPHAGOGASTRODUODENOSCOPY (EGD) WITH PROPOFOL;  Surgeon: Scot Jun, MD;  Location: [facility] ENDOSCOPY;  Service: Endoscopy;  Laterality: N/A;   ESOPHAGOGASTRODUODENOSCOPY (EGD) WITH PROPOFOL N/A 03/13/2017   Procedure: ESOPHAGOGASTRODUODENOSCOPY (EGD) WITH PROPOFOL;  Surgeon: Wyline Mood, MD;  Location: [facility] ENDOSCOPY;  Service: Endoscopy;  Laterality: N/A;   GALLBLADDER SURGERY     GASTRIC BYPASS     GIVENS CAPSULE STUDY N/A 03/13/2017   Procedure: GIVENS CAPSULE STUDY;  Surgeon: Wyline Mood, MD;  Location: [facility] ENDOSCOPY;  Service: Endoscopy;  Laterality: N/A;   HERNIA REPAIR     JOINT REPLACEMENT  1995   REPLACEMENT TOTAL KNEE     ROTATOR CUFF REPAIR      Social History   Socioeconomic History   Marital status: Widowed    Spouse name: Not on file   Number of children: Not on file   Years of education: Not on file   Highest education level: Not on file  Occupational History   Occupation: disabled  Tobacco Use   Smoking status: Never   Smokeless tobacco: Never  Vaping Use   Vaping status: Never Used  Substance and Sexual Activity   Alcohol use: No    Alcohol/week: 0.0 standard drinks of alcohol   Drug use: No  Sexual activity: Yes  Other Topics Concern   Not on file  Social History Narrative   Not on file   Social Determinants of Health   Financial Resource Strain: Low Risk  (07/20/2022)   Received from [facility], [facility]   Overall Financial Resource Strain (CARDIA)    Difficulty of Paying Living Expenses: Not hard at all  Food Insecurity: No Food Insecurity (07/20/2022)   Received from [facility], [facility]   Hunger Vital Sign    Worried About Running Out of Food in the Last Year: Never true    Ran Out of Food in the Last Year: Never true  Transportation Needs: No Transportation Needs  (07/20/2022)   Received from [facility], [facility]   [facility] - Transportation    Lack of Transportation (Medical): No    Lack of Transportation (Non-Medical): No  Physical Activity: Not on file  Stress: Not on file  Social Connections: Not on file  Intimate Partner Violence: Not on file    Family History  Problem Relation Age of Onset   Hypertension Father    COPD Father    Heart disease Father    Breast cancer Neg Hx     Allergies  Allergen Reactions   Morphine Hives   Iodinated Contrast Media Hives   Aspirin Hives    Noted on MD progress notes and discussed with MD 09/02/15   Etodolac Hives   Ibuprofen Hives   Shellfish Allergy Hives   Tylenol [Acetaminophen] Hives    Upset stomach     Outpatient Medications Prior to Visit  Medication Sig   albuterol (VENTOLIN HFA) 108 (90 Base) MCG/ACT inhaler Inhale 2 puffs into the lungs every 4 (four) hours as needed for wheezing or shortness of breath.   ALPRAZolam (XANAX) 0.25 MG tablet TAKE 1 TABLET BY MOUTH 3 TIMES A DAY    apixaban (ELIQUIS) 5 MG TABS tablet Take 1 tablet (5 mg total) by mouth 2 (two) times daily.   atorvastatin (LIPITOR) 40 MG tablet TAKE 1 TABLET(40 MG) BY MOUTH EVERY EVENING   baclofen (LIORESAL) 10 MG tablet Take 1 tablet (10 mg total) by mouth 3 (three) times daily.   busPIRone (BUSPAR) 7.5 MG tablet Take 1 tablet (7.5 mg total) by mouth 2 (two) times daily.   Calcium Carbonate-Vitamin D (OYSTER SHELL CALCIUM 500 + D PO) Take 1 tablet by mouth daily.   celecoxib (CELEBREX) 400 MG capsule Take 400 mg by mouth every morning.   cloNIDine (CATAPRES) 0.1 MG tablet TAKE 1 TABLET BY MOUTH AT BEDTIME   diclofenac Sodium (VOLTAREN) 1 % GEL Apply 4 g topically 2 (two) times daily. Apply 4 grams twice a week to neck.   empagliflozin (JARDIANCE) 25 MG TABS tablet Take 1 tablet (25 mg total) by mouth daily.   FEROSUL 325 (65 Fe) MG tablet TAKE 1 TABLET BY MOUTH TWICE DAILY   fexofenadine (ALLERGY RELIEF) 180  MG tablet Take 1 tablet (180 mg total) by mouth daily.   fluticasone (FLONASE) 50 MCG/ACT nasal spray Place 1 spray into both nostrils daily.   folic acid (FOLVITE) 1 MG tablet Take 1 tablet (1 mg total) by mouth daily.   glucose blood (ONETOUCH VERIO) test strip TEST TWICE A DAY   hydrOXYzine (ATARAX) 25 MG tablet TAKE 1 TABLET BY MOUTH 3 TIMES A DAY   meclizine (ANTIVERT) 25 MG tablet TAKE 1 TABLET BY MOUTH THREE TIMES DAILY   metoprolol succinate (TOPROL-XL) 100  MG 24 hr tablet TAKE 1 TABLET BY MOUTH EVERY DAY   mirtazapine (REMERON) 45 MG tablet Take 1 tablet (45 mg total) by mouth at bedtime.   MOUNJARO 10 MG/0.5ML Pen INJECT 10 MG UNDER THE SKIN ONCE WEEKLY   naloxone (NARCAN) nasal spray 4 mg/0.1 mL FOR SUSPECTED OPIOID OVERDOSE, ADMINISTER 1 SPRAY INTO ONE NOSTRIL. REPEAT IN OTHER NOSTRIL USING A NEW DEVICE AFTER 2-3 MINUTES IF NO OR MINIMAL RESPONSE. CALL 911 IF USED   NEXIUM 40 MG capsule Take 1 capsule (40 mg total) by mouth 2 (two) times daily.   OLMESARTAN MEDOXOMIL PO Take by mouth.   oxybutynin (DITROPAN) 5 MG tablet TAKE 1 TABLET BY MOUTH TWICE DAILY   Oxycodone HCl 20 MG TABS Take 1 tablet (20 mg total) by mouth in the morning, at noon, in the evening, and at bedtime. As needed for pain   polyethylene glycol (MIRALAX / GLYCOLAX) 17 g packet Take 17 g by mouth daily as needed for mild constipation.   potassium chloride (KLOR-CON) 10 MEQ tablet Take 1 tablet (10 mEq total) by mouth daily.   sucralfate (CARAFATE) 1 GM/10ML suspension Take 10 mLs (1 g total) by mouth 4 (four) times daily -  with meals and at bedtime for 14 days.   Suvorexant (BELSOMRA) 20 MG TABS TAKE 1 TABLET(20 MG) BY MOUTH AT BEDTIME   TRESIBA FLEXTOUCH 200 UNIT/ML FlexTouch Pen Inject 28 Units into the skin every morning.   UBRELVY 100 MG TABS Take 1 tablet (100 mg total) by mouth as needed.   No facility-administered medications prior to visit.    Review of Systems  HENT: Negative.    Eyes: Negative.    Respiratory:  Negative for cough.   Cardiovascular: Negative.   Gastrointestinal: Negative.   Genitourinary: Negative.   Skin: Negative.   Neurological: Negative.   Endo/Heme/Allergies: Negative.   Psychiatric/Behavioral:  The patient has insomnia.        Objective:   BP 134/82   Pulse (!) 115   Ht 5\' 7"  (1.702 m)   Wt 253 lb 6.4 oz (114.9 kg)   SpO2 97%   BMI 39.69 kg/m   Vitals:   10/29/23 1342  BP: 134/82  Pulse: (!) 115  Height: 5\' 7"  (1.702 m)  Weight: 253 lb 6.4 oz (114.9 kg)  SpO2: 97%  BMI (Calculated): 39.68    Physical Exam Vitals reviewed.  Constitutional:      General: She is not in acute distress.    Appearance: She is obese.  HENT:     Head: Normocephalic.     Nose: Nose normal.     Mouth/Throat:     Mouth: Mucous membranes are moist.  Eyes:     Extraocular Movements: Extraocular movements intact.     Pupils: Pupils are equal, round, and reactive to light.  Cardiovascular:     Rate and Rhythm: Normal rate and regular rhythm.     Heart sounds: No murmur heard. Pulmonary:     Effort: Pulmonary effort is normal.     Breath sounds: No rhonchi or rales.  Abdominal:     General: Abdomen is flat.     Palpations: There is no hepatomegaly, splenomegaly or mass.  Musculoskeletal:        General: Normal range of motion.     Cervical back: Normal range of motion. No tenderness.  Skin:    General: Skin is warm and dry.  Neurological:     General: No focal deficit present.  Mental Status: She is alert and oriented to person, place, and time.     Cranial Nerves: No cranial nerve deficit.     Motor: No weakness.  Psychiatric:        Mood and Affect: Mood normal.        Behavior: Behavior normal.      Results for orders placed or performed in visit on 10/29/23  POCT CBG (Fasting - Glucose)  Result Value Ref Range   Glucose Fasting, POC 124 (A) 70 - 99 mg/dL        Assessment & Plan:  As per problem list. Temporarily replace Belsomra  with Quviviq.  Problem List Items Addressed This Visit       Endocrine   Type II diabetes mellitus with renal manifestations (HCC) - Primary   Relevant Orders   POCT CBG (Fasting - Glucose) (Completed)     Other   Primary insomnia   Relevant Medications   Daridorexant HCl (QUVIVIQ) 50 MG TABS   Other Visit Diagnoses     Encounter for screening mammogram for malignant neoplasm of breast       Relevant Orders   MM 3D SCREENING MAMMOGRAM BILATERAL BREAST       Return if symptoms worsen or fail to improve.   Total time spent: 35 minutes  Luna Fuse, MD  10/29/2023   This document may have been prepared by [facility] Voice Recognition software and as such may include unintentional dictation errors.

## 2023-11-02 ENCOUNTER — Other Ambulatory Visit: Payer: Self-pay | Admitting: Internal Medicine

## 2023-11-04 DIAGNOSIS — R0902 Hypoxemia: Secondary | ICD-10-CM | POA: Diagnosis not present

## 2023-11-06 ENCOUNTER — Telehealth: Payer: Self-pay | Admitting: Internal Medicine

## 2023-11-06 NOTE — Telephone Encounter (Signed)
Darl Pikes from [facility] called because 11/25 pt had mirtazapine 15 mg filled that was written from a doctor out of [place]. Now pt is requesting refill of mirtazapine 45 mg that she used to get from [place]. Darl Pikes wants to know if she should fill the 45 mg. I spoke with TJ and he said not to fill any Rx's for her until he sees her in office this Friday. Darl Pikes notified of that.

## 2023-11-09 ENCOUNTER — Ambulatory Visit (INDEPENDENT_AMBULATORY_CARE_PROVIDER_SITE_OTHER): Payer: 59 | Admitting: Internal Medicine

## 2023-11-09 VITALS — BP 139/84 | HR 115 | Ht 67.0 in | Wt 244.6 lb

## 2023-11-09 DIAGNOSIS — N1831 Chronic kidney disease, stage 3a: Secondary | ICD-10-CM | POA: Diagnosis not present

## 2023-11-09 DIAGNOSIS — G8929 Other chronic pain: Secondary | ICD-10-CM | POA: Diagnosis not present

## 2023-11-09 DIAGNOSIS — F5101 Primary insomnia: Secondary | ICD-10-CM | POA: Diagnosis not present

## 2023-11-09 DIAGNOSIS — Z794 Long term (current) use of insulin: Secondary | ICD-10-CM

## 2023-11-09 DIAGNOSIS — I2609 Other pulmonary embolism with acute cor pulmonale: Secondary | ICD-10-CM | POA: Diagnosis not present

## 2023-11-09 DIAGNOSIS — I10 Essential (primary) hypertension: Secondary | ICD-10-CM | POA: Diagnosis not present

## 2023-11-09 DIAGNOSIS — J301 Allergic rhinitis due to pollen: Secondary | ICD-10-CM | POA: Diagnosis not present

## 2023-11-09 DIAGNOSIS — I2782 Chronic pulmonary embolism: Secondary | ICD-10-CM | POA: Diagnosis not present

## 2023-11-09 DIAGNOSIS — E1122 Type 2 diabetes mellitus with diabetic chronic kidney disease: Secondary | ICD-10-CM

## 2023-11-09 DIAGNOSIS — D500 Iron deficiency anemia secondary to blood loss (chronic): Secondary | ICD-10-CM | POA: Diagnosis not present

## 2023-11-09 MED ORDER — BELSOMRA 20 MG PO TABS: 1.0000 | ORAL_TABLET | Freq: Every evening | ORAL | 2 refills | Status: DC

## 2023-11-09 MED ORDER — OXYCODONE HCL 20 MG PO TABS: 20.0000 mg | ORAL_TABLET | Freq: Four times a day (QID) | ORAL | 0 refills | Status: DC

## 2023-11-09 MED ORDER — FEXOFENADINE HCL 180 MG PO TABS: 180.0000 mg | ORAL_TABLET | Freq: Every day | ORAL | 3 refills | Status: DC

## 2023-11-09 MED ORDER — FOLIC ACID 1 MG PO TABS: 1.0000 mg | ORAL_TABLET | Freq: Every day | ORAL | 3 refills | Status: DC

## 2023-11-09 NOTE — Progress Notes (Unsigned)
Established Patient Office Visit  Subjective:  Patient ID: Lindsey [name], female    DOB: Jul 15, 1961  Age: 62 y.o. MRN: [id number]  Chief Complaint  Patient presents with   Pain Management    Here for pain management follow up. Chronic pain well controlled on current analgesia. Last UDS satisfactory and pill counts have also been satisfactory.     No other concerns at this time.   Past Medical History:  Diagnosis Date   Allergy    Anemia    Arthritis    knees   CHF (congestive heart failure) (HCC)    COPD (chronic obstructive pulmonary disease) (HCC)    Diabetes mellitus without complication (HCC)    diet controlled   GERD (gastroesophageal reflux disease)    Heart attack (HCC) 2002   Hyperlipidemia    Hypertension    PUD (peptic ulcer disease)    Sleep apnea    can't tolerate CPAP.  Uses O2 only   Spinal cord stimulator status    for lower back pain   Stroke [facility]) 2004   "mini-stroke"   Vertigo    Wears dentures    full upper and lower    Past Surgical History:  Procedure Laterality Date   ABDOMINAL HYSTERECTOMY     back surgery     broken wrist     COLONOSCOPY WITH PROPOFOL N/A 01/30/2017   Procedure: COLONOSCOPY WITH PROPOFOL;  Surgeon: Wyline Mood, MD;  Location: [facility] ENDOSCOPY;  Service: Endoscopy;  Laterality: N/A;   COLONOSCOPY WITH PROPOFOL N/A 02/20/2017   Procedure: Colonoscopy with propofol ;  Surgeon: Wyline Mood, MD;  Location: [facility] ENDOSCOPY;  Service: Endoscopy;  Laterality: N/A;   COLONOSCOPY WITH PROPOFOL N/A 03/13/2017   Procedure: COLONOSCOPY WITH PROPOFOL;  Surgeon: Wyline Mood, MD;  Location: [facility] ENDOSCOPY;  Service: Endoscopy;  Laterality: N/A;   COLONOSCOPY WITH PROPOFOL N/A 04/17/2017   Procedure: COLONOSCOPY WITH PROPOFOL;  Surgeon: Wyline Mood, MD;  Location: [facility] ENDOSCOPY;  Service: Endoscopy;  Laterality: N/A;   ESOPHAGOGASTRODUODENOSCOPY N/A 05/05/2020   Procedure: ESOPHAGOGASTRODUODENOSCOPY (EGD);  Surgeon: Toledo, Boykin Nearing, MD;   Location: [facility] ENDOSCOPY;  Service: Gastroenterology;  Laterality: N/A;   ESOPHAGOGASTRODUODENOSCOPY (EGD) WITH PROPOFOL N/A 09/28/2016   Procedure: ESOPHAGOGASTRODUODENOSCOPY (EGD) WITH PROPOFOL;  Surgeon: Scot Jun, MD;  Location: [facility] ENDOSCOPY;  Service: Endoscopy;  Laterality: N/A;   ESOPHAGOGASTRODUODENOSCOPY (EGD) WITH PROPOFOL N/A 03/13/2017   Procedure: ESOPHAGOGASTRODUODENOSCOPY (EGD) WITH PROPOFOL;  Surgeon: Wyline Mood, MD;  Location: [facility] ENDOSCOPY;  Service: Endoscopy;  Laterality: N/A;   GALLBLADDER SURGERY     GASTRIC BYPASS     GIVENS CAPSULE STUDY N/A 03/13/2017   Procedure: GIVENS CAPSULE STUDY;  Surgeon: Wyline Mood, MD;  Location: [facility] ENDOSCOPY;  Service: Endoscopy;  Laterality: N/A;   HERNIA REPAIR     JOINT REPLACEMENT  1995   REPLACEMENT TOTAL KNEE     ROTATOR CUFF REPAIR      Social History   Socioeconomic History   Marital status: Widowed    Spouse name: Not on file   Number of children: Not on file   Years of education: Not on file   Highest education level: Not on file  Occupational History   Occupation: disabled  Tobacco Use   Smoking status: Never   Smokeless tobacco: Never  Vaping Use   Vaping status: Never Used  Substance and Sexual Activity   Alcohol use: No    Alcohol/week: 0.0 standard drinks of alcohol   Drug use: No   Sexual activity: Yes  Other Topics Concern   Not on file  Social History Narrative   Not on file   Social Drivers of Health   Financial Resource Strain: Low Risk  (07/20/2022)   Received [facility], [facility]   Overall Financial Resource Strain (CARDIA)    Difficulty of Paying Living Expenses: Not hard at all  Food Insecurity: No Food Insecurity (07/20/2022)   Received from [facility], [facility]   Hunger Vital Sign    Worried About Running Out of Food in the Last Year: Never true    Ran Out of Food in the Last Year: Never true  Transportation Needs: No Transportation Needs (07/20/2022)    Received from [facility], [facility]   [facility] - Transportation    Lack of Transportation (Medical): No    Lack of Transportation (Non-Medical): No  Physical Activity: Not on file  Stress: Not on file  Social Connections: Not on file  Intimate Partner Violence: Not on file    Family History  Problem Relation Age of Onset   Hypertension Father    COPD Father    Heart disease Father    Breast cancer Neg Hx     Allergies  Allergen Reactions   Morphine Hives   Iodinated Contrast Media Hives   Aspirin Hives    Noted on MD progress notes and discussed with MD 09/02/15   Etodolac Hives   Ibuprofen Hives   Shellfish Allergy Hives   Tylenol [Acetaminophen] Hives    Upset stomach     Outpatient Medications Prior to Visit  Medication Sig   albuterol (VENTOLIN HFA) 108 (90 Base) MCG/ACT inhaler Inhale 2 puffs into the lungs every 4 (four) hours as needed for wheezing or shortness of breath.   ALPRAZolam (XANAX) 0.25 MG tablet TAKE 1 TABLET BY MOUTH 3 TIMES A DAY NFB 05/28/23   apixaban (ELIQUIS) 5 MG TABS tablet Take 1 tablet (5 mg total) by mouth 2 (two) times daily.   atorvastatin (LIPITOR) 40 MG tablet TAKE 1 TABLET(40 MG) BY MOUTH EVERY EVENING   baclofen (LIORESAL) 10 MG tablet TAKE 1 TABLET BY MOUTH 3 TIMES A DAY   busPIRone (BUSPAR) 7.5 MG tablet Take 1 tablet (7.5 mg total) by mouth 2 (two) times daily.   Calcium Carbonate-Vitamin D (OYSTER SHELL CALCIUM 500 + D PO) Take 1 tablet by mouth daily.   celecoxib (CELEBREX) 400 MG capsule Take 400 mg by mouth every morning.   cloNIDine (CATAPRES) 0.1 MG tablet TAKE 1 TABLET BY MOUTH AT BEDTIME   diclofenac Sodium (VOLTAREN) 1 % GEL Apply 4 g topically 2 (two) times daily. Apply 4 grams twice a week to neck.   empagliflozin (JARDIANCE) 25 MG TABS tablet Take 1 tablet (25 mg total) by mouth daily.   FEROSUL 325 (65 Fe) MG tablet TAKE 1 TABLET BY MOUTH TWICE DAILY   fluticasone (FLONASE) 50 MCG/ACT nasal spray Place 1 spray  into both nostrils daily.   glucose blood (ONETOUCH VERIO) test strip TEST TWICE A DAY   hydrOXYzine (ATARAX) 25 MG tablet TAKE 1 TABLET BY MOUTH 3 TIMES A DAY   meclizine (ANTIVERT) 25 MG tablet TAKE 1 TABLET BY MOUTH THREE TIMES DAILY   metoprolol succinate (TOPROL-XL) 100 MG 24 hr tablet TAKE 1 TABLET BY MOUTH EVERY DAY   mirtazapine (REMERON) 45 MG tablet Take 1 tablet (45 mg total) by mouth at bedtime.   MOUNJARO 10 MG/0.5ML Pen INJECT 10 MG UNDER THE SKIN  ONCE WEEKLY   naloxone (NARCAN) nasal spray 4 mg/0.1 mL FOR SUSPECTED OPIOID OVERDOSE, ADMINISTER 1 SPRAY INTO ONE NOSTRIL. REPEAT IN OTHER NOSTRIL USING A NEW DEVICE AFTER 2-3 MINUTES IF NO OR MINIMAL RESPONSE. CALL 911 IF USED   NEXIUM 40 MG capsule Take 1 capsule (40 mg total) by mouth 2 (two) times daily.   OLMESARTAN MEDOXOMIL PO Take by mouth.   oxybutynin (DITROPAN) 5 MG tablet TAKE 1 TABLET BY MOUTH TWICE DAILY   polyethylene glycol (MIRALAX / GLYCOLAX) 17 g packet Take 17 g by mouth daily as needed for mild constipation.   potassium chloride (KLOR-CON) 10 MEQ tablet Take 1 tablet (10 mEq total) by mouth daily.   sucralfate (CARAFATE) 1 GM/10ML suspension Take 10 mLs (1 g total) by mouth 4 (four) times daily -  with meals and at bedtime for 14 days.   TRESIBA FLEXTOUCH 200 UNIT/ML FlexTouch Pen Inject 28 Units into the skin every morning.   UBRELVY 100 MG TABS Take 1 tablet (100 mg total) by mouth as needed.   [DISCONTINUED] Daridorexant HCl (QUVIVIQ) 50 MG TABS Take 1 tablet (50 mg total) by mouth at bedtime.   [DISCONTINUED] fexofenadine (ALLERGY RELIEF) 180 MG tablet Take 1 tablet (180 mg total) by mouth daily.   [DISCONTINUED] folic acid (FOLVITE) 1 MG tablet Take 1 tablet (1 mg total) by mouth daily.   [DISCONTINUED] Oxycodone HCl 20 MG TABS Take 1 tablet (20 mg total) by mouth in the morning, at noon, in the evening, and at bedtime. As needed for pain   [DISCONTINUED] Suvorexant (BELSOMRA) 20 MG TABS TAKE 1 TABLET(20 MG) BY  MOUTH AT BEDTIME   No facility-administered medications prior to visit.    Review of Systems  HENT: Negative.    Eyes: Negative.   Respiratory:  Negative for cough.   Cardiovascular: Negative.   Gastrointestinal: Negative.   Genitourinary: Negative.   Skin: Negative.   Neurological: Negative.   Endo/Heme/Allergies: Negative.   Psychiatric/Behavioral:  The patient has insomnia.        Objective:   BP 139/84   Pulse (!) 115   Ht 5\' 7"  (1.702 m)   Wt 244 lb 9.6 oz (110.9 kg)   SpO2 98%   BMI 38.31 kg/m   Vitals:   11/09/23 0949  BP: 139/84  Pulse: (!) 115  Height: 5\' 7"  (1.702 m)  Weight: 244 lb 9.6 oz (110.9 kg)  SpO2: 98%  BMI (Calculated): 38.3    Physical Exam Vitals reviewed.  Constitutional:      General: She is not in acute distress.    Appearance: She is obese.  HENT:     Head: Normocephalic.     Nose: Nose normal.     Mouth/Throat:     Mouth: Mucous membranes are moist.  Eyes:     Extraocular Movements: Extraocular movements intact.     Pupils: Pupils are equal, round, and reactive to light.  Cardiovascular:     Rate and Rhythm: Normal rate and regular rhythm.     Heart sounds: No murmur heard. Pulmonary:     Effort: Pulmonary effort is normal.     Breath sounds: No rhonchi or rales.  Abdominal:     General: Abdomen is flat.     Palpations: There is no hepatomegaly, splenomegaly or mass.  Musculoskeletal:        General: Normal range of motion.     Cervical back: Normal range of motion. No tenderness.  Skin:    General: Skin  is warm and dry.  Neurological:     General: No focal deficit present.     Mental Status: She is alert and oriented to person, place, and time.     Cranial Nerves: No cranial nerve deficit.     Motor: No weakness.  Psychiatric:        Mood and Affect: Mood normal.        Behavior: Behavior normal.      No results found for any visits on 11/09/23.      Assessment & Plan:  As per problem list Will refill  sleeping meds on the 25th. Problem List Items Addressed This Visit       Cardiovascular and Mediastinum   Pulmonary embolism (HCC)   HTN (hypertension)     Endocrine   Type II diabetes mellitus with renal manifestations (HCC)     Other   Anemia   Relevant Medications   folic acid (FOLVITE) 1 MG tablet   Other chronic pain   Relevant Medications   Oxycodone HCl 20 MG TABS   Primary insomnia - Primary   Relevant Medications   Suvorexant (BELSOMRA) 20 MG TABS (Start on 11/22/2023)   Other Visit Diagnoses       Seasonal allergic rhinitis due to pollen       Relevant Medications   fexofenadine (ALLERGY RELIEF) 180 MG tablet       Return in about 1 month (around 12/10/2023).  Total time spent 20 mins Luna Fuse, MD  11/09/2023   This document may have been prepared by [facility] Voice Recognition software and as such may include unintentional dictation errors.

## 2023-11-13 ENCOUNTER — Ambulatory Visit (INDEPENDENT_AMBULATORY_CARE_PROVIDER_SITE_OTHER): Payer: 59 | Admitting: Family

## 2023-11-13 ENCOUNTER — Encounter: Payer: Self-pay | Admitting: Family

## 2023-11-13 VITALS — BP 137/86 | HR 95 | Ht 67.0 in | Wt 248.8 lb

## 2023-11-13 DIAGNOSIS — Z01419 Encounter for gynecological examination (general) (routine) without abnormal findings: Secondary | ICD-10-CM

## 2023-11-13 DIAGNOSIS — Z124 Encounter for screening for malignant neoplasm of cervix: Secondary | ICD-10-CM | POA: Diagnosis not present

## 2023-11-19 ENCOUNTER — Other Ambulatory Visit: Payer: Self-pay | Admitting: Internal Medicine

## 2023-11-20 LAB — IGP, APTIMA HPV
HPV Aptima: NEGATIVE
PAP Smear Comment: 0

## 2023-11-20 LAB — SPECIMEN STATUS REPORT

## 2023-11-25 ENCOUNTER — Encounter: Payer: Self-pay | Admitting: Family

## 2023-11-25 NOTE — Progress Notes (Signed)
Established Patient Office Visit  Subjective:  Patient ID: Lindsey [name], female    DOB: 1961/05/10  Age: 62 y.o. MRN: [id number]  Chief Complaint  Patient presents with   Gynecologic Exam    PAP    Patient here today for gynecological and breast exam.  She had her full physical recently with Dr. Ellsworth Lennox.   No other concerns today.     No other concerns at this time.   Past Medical History:  Diagnosis Date   Allergy    Anemia    Arthritis    knees   CHF (congestive heart failure) (HCC)    COPD (chronic obstructive pulmonary disease) (HCC)    Diabetes mellitus without complication (HCC)    diet controlled   GERD (gastroesophageal reflux disease)    Heart attack (HCC) 2002   Hyperlipidemia    Hypertension    PUD (peptic ulcer disease)    Sleep apnea    can't tolerate CPAP.  Uses O2 only   Spinal cord stimulator status    for lower back pain   Stroke [facility]) 2004   "mini-stroke"   Vertigo    Wears dentures    full upper and lower    Past Surgical History:  Procedure Laterality Date   ABDOMINAL HYSTERECTOMY     back surgery     broken wrist     COLONOSCOPY WITH PROPOFOL N/A 01/30/2017   Procedure: COLONOSCOPY WITH PROPOFOL;  Surgeon: Wyline Mood, MD;  Location: [facility] ENDOSCOPY;  Service: Endoscopy;  Laterality: N/A;   COLONOSCOPY WITH PROPOFOL N/A 02/20/2017   Procedure: Colonoscopy with propofol ;  Surgeon: Wyline Mood, MD;  Location: [facility] ENDOSCOPY;  Service: Endoscopy;  Laterality: N/A;   COLONOSCOPY WITH PROPOFOL N/A 03/13/2017   Procedure: COLONOSCOPY WITH PROPOFOL;  Surgeon: Wyline Mood, MD;  Location: [facility] ENDOSCOPY;  Service: Endoscopy;  Laterality: N/A;   COLONOSCOPY WITH PROPOFOL N/A 04/17/2017   Procedure: COLONOSCOPY WITH PROPOFOL;  Surgeon: Wyline Mood, MD;  Location: [facility] ENDOSCOPY;  Service: Endoscopy;  Laterality: N/A;   ESOPHAGOGASTRODUODENOSCOPY N/A 05/05/2020   Procedure: ESOPHAGOGASTRODUODENOSCOPY (EGD);  Surgeon: Toledo, Boykin Nearing, MD;  Location:  [facility] ENDOSCOPY;  Service: Gastroenterology;  Laterality: N/A;   ESOPHAGOGASTRODUODENOSCOPY (EGD) WITH PROPOFOL N/A 09/28/2016   Procedure: ESOPHAGOGASTRODUODENOSCOPY (EGD) WITH PROPOFOL;  Surgeon: Scot Jun, MD;  Location[facility] ENDOSCOPY;  Service: Endoscopy;  Laterality: N/A;   ESOPHAGOGASTRODUODENOSCOPY (EGD) WITH PROPOFOL N/A 03/13/2017   Procedure: ESOPHAGOGASTRODUODENOSCOPY (EGD) WITH PROPOFOL;  Surgeon: Wyline Mood, MD;  Location: [facility] ENDOSCOPY;  Service: Endoscopy;  Laterality: N/A;   GALLBLADDER SURGERY     GASTRIC BYPASS     GIVENS CAPSULE STUDY N/A 03/13/2017   Procedure: GIVENS CAPSULE STUDY;  Surgeon: Wyline Mood, MD;  Location: [facility] ENDOSCOPY;  Service: Endoscopy;  Laterality: N/A;   HERNIA REPAIR     JOINT REPLACEMENT  1995   REPLACEMENT TOTAL KNEE     ROTATOR CUFF REPAIR      Social History   Socioeconomic History   Marital status: Widowed    Spouse name: Not on file   Number of children: Not on file   Years of education: Not on file   Highest education level: Not on file  Occupational History   Occupation: disabled  Tobacco Use   Smoking status: Never   Smokeless tobacco: Never  Vaping Use   Vaping status: Never Used  Substance and Sexual Activity   Alcohol use: No    Alcohol/week: 0.0 standard drinks of alcohol   Drug use: No  Sexual activity: Yes  Other Topics Concern   Not on file  Social History Narrative   Not on file   Social Drivers of Health   Financial Resource Strain: Low Risk  (07/20/2022)   Received from [facility]   Overall Financial Resource Strain (CARDIA)    Difficulty of Paying Living Expenses: Not hard at all  Food Insecurity: No Food Insecurity (07/20/2022)   Received from [facility]   Hunger Vital Sign    Worried About Running Out of Food in the Last Year: Never true    Ran Out of Food in the Last Year: Never true  Transportation Needs: No Transportation Needs (07/20/2022)   Received  from [facility], [facility]   [facility] - Transportation    Lack of Transportation (Medical): No    Lack of Transportation (Non-Medical): No  Physical Activity: Not on file  Stress: Not on file  Social Connections: Not on file  Intimate Partner Violence: Not on file    Family History  Problem Relation Age of Onset   Hypertension Father    COPD Father    Heart disease Father    Breast cancer Neg Hx     Allergies  Allergen Reactions   Morphine Hives   Iodinated Contrast Media Hives   Aspirin Hives    Noted on MD progress notes and discussed with MD 09/02/15   Etodolac Hives   Ibuprofen Hives   Shellfish Allergy Hives   Tylenol [Acetaminophen] Hives    Upset stomach     Review of Systems  All other systems reviewed and are negative.      Objective:   BP 137/86   Pulse 95   Ht 5\' 7"  (1.702 m)   Wt 248 lb 12.8 oz (112.9 kg)   SpO2 97%   BMI 38.97 kg/m   Vitals:   11/13/23 1022  BP: 137/86  Pulse: 95  Height: 5\' 7"  (1.702 m)  Weight: 248 lb 12.8 oz (112.9 kg)  SpO2: 97%  BMI (Calculated): 38.96    Physical Exam Vitals and nursing note reviewed.  Constitutional:      Appearance: Normal appearance. She is well-developed and overweight.  HENT:     Head: Normocephalic.  Eyes:     Extraocular Movements: Extraocular movements intact.     Conjunctiva/sclera: Conjunctivae normal.     Pupils: Pupils are equal, round, and reactive to light.  Cardiovascular:     Rate and Rhythm: Normal rate.  Pulmonary:     Effort: Pulmonary effort is normal.  Neurological:     General: No focal deficit present.     Mental Status: She is alert and oriented to person, place, and time. Mental status is at baseline.  Psychiatric:        Mood and Affect: Mood normal.        Behavior: Behavior normal. Behavior is cooperative.        Thought Content: Thought content normal.        Judgment: Judgment normal.      Results for orders placed or performed in visit on  11/13/23  Specimen status report  Result Value Ref Range   specimen status report Comment   IGP, Aptima HPV  Result Value Ref Range   DIAGNOSIS: Comment    Specimen adequacy: Comment    Clinician Provided ICD10 Comment    Performed by: Comment    QC reviewed by: Comment    PAP Smear  Comment .    Note: Comment    Test Methodology CANCELED    HPV Aptima Negative Negative    Recent Results (from the past 2160 hours)  POCT CBG (Fasting - Glucose)     Status: Abnormal   Collection Time: 09/10/23  9:53 AM  Result Value Ref Range   Glucose Fasting, POC 151 (A) 70 - 99 mg/dL  POCT CBG (Fasting - Glucose)     Status: Abnormal   Collection Time: 10/10/23  8:52 AM  Result Value Ref Range   Glucose Fasting, POC 116 (A) 70 - 99 mg/dL  POCT CBG (Fasting - Glucose)     Status: Abnormal   Collection Time: 10/29/23  1:47 PM  Result Value Ref Range   Glucose Fasting, POC 124 (A) 70 - 99 mg/dL  POC CREATINE & ALBUMIN,URINE     Status: None   Collection Time: 10/29/23  2:38 PM  Result Value Ref Range   Microalbumin Ur, POC 150 mg/L   Creatinine, POC 300 mg/dL   Albumin/Creatinine Ratio, Urine, POC 300-300   IGP, Aptima HPV     Status: None   Collection Time: 11/13/23 12:00 AM  Result Value Ref Range   DIAGNOSIS: Comment     Comment: UNSATISFACTORY FOR EVALUATION.   Specimen adequacy: Comment     Comment: Specimen processed and examined, but unsatisfactory for evaluation of epithelial abnormality because of insufficient squamous cellularity.    Clinician Provided ICD10 Comment     Comment: Z12.4   Performed by: Comment     Comment: Frances Maywood, BS, Cytotechnologist (ASCP)   QC reviewed by: Comment     Comment: Elie Confer, Cytotechnologist (ASCP)   PAP Smear Comment .    Note: Comment     Comment: The Pap smear is a screening test designed to aid in the detection of premalignant and malignant conditions of the uterine cervix.  It is not a diagnostic procedure and should not be  used as the sole means of detecting cervical cancer.  Both false-positive and false-negative reports do occur.    Test Methodology CANCELED     Comment: The Thin Prep(R) Imager was unable to read this specimen.  Therefore a manual review was performed.  Result canceled by the ancillary.    HPV Aptima Negative Negative    Comment: This nucleic acid amplification test detects fourteen high-risk HPV types (16,18,31,33,35,39,45,51,52,56,58,59,66,68) without differentiation.   Specimen status report     Status: None   Collection Time: 11/13/23 12:00 AM  Result Value Ref Range   specimen status report Comment     Comment: Please note Please note The date and/or time of collection was not indicated on the requisition as required by state and federal law.  The date of receipt of the specimen was used as the collection date if not supplied.        Assessment & Plan:   Problem List Items Addressed This Visit   None Visit Diagnoses       Cervical cancer screening    -  Primary   Relevant Orders   IGP, Aptima HPV (Completed)     Encounter for gynecological examination (general) (routine) without abnormal findings          Pap Smear done today.  Will call pt with results when they are available.   Return as previously scheduled wtih Dr. Ellsworth Lennox.   Total time spent: 20 minutes  Miki Kins, FNP  11/13/2023   This document may have been prepared  by [employer] and as such may include unintentional dictation errors.

## 2023-12-03 ENCOUNTER — Ambulatory Visit: Payer: 59 | Admitting: Internal Medicine

## 2023-12-04 ENCOUNTER — Other Ambulatory Visit: Payer: Self-pay | Admitting: Internal Medicine

## 2023-12-04 DIAGNOSIS — E1122 Type 2 diabetes mellitus with diabetic chronic kidney disease: Secondary | ICD-10-CM

## 2023-12-04 DIAGNOSIS — E669 Obesity, unspecified: Secondary | ICD-10-CM

## 2023-12-05 DIAGNOSIS — R0902 Hypoxemia: Secondary | ICD-10-CM | POA: Diagnosis not present

## 2023-12-05 DIAGNOSIS — J45998 Other asthma: Secondary | ICD-10-CM | POA: Diagnosis not present

## 2023-12-10 ENCOUNTER — Ambulatory Visit (INDEPENDENT_AMBULATORY_CARE_PROVIDER_SITE_OTHER): Payer: 59 | Admitting: Internal Medicine

## 2023-12-10 VITALS — BP 131/72 | HR 78 | Ht 67.0 in | Wt 243.0 lb

## 2023-12-10 DIAGNOSIS — Z794 Long term (current) use of insulin: Secondary | ICD-10-CM | POA: Diagnosis not present

## 2023-12-10 DIAGNOSIS — F418 Other specified anxiety disorders: Secondary | ICD-10-CM

## 2023-12-10 DIAGNOSIS — E1122 Type 2 diabetes mellitus with diabetic chronic kidney disease: Secondary | ICD-10-CM

## 2023-12-10 DIAGNOSIS — G8929 Other chronic pain: Secondary | ICD-10-CM | POA: Diagnosis not present

## 2023-12-10 DIAGNOSIS — I10 Essential (primary) hypertension: Secondary | ICD-10-CM

## 2023-12-10 DIAGNOSIS — N1831 Chronic kidney disease, stage 3a: Secondary | ICD-10-CM | POA: Diagnosis not present

## 2023-12-10 DIAGNOSIS — M79671 Pain in right foot: Secondary | ICD-10-CM | POA: Diagnosis not present

## 2023-12-10 MED ORDER — ALPRAZOLAM 0.25 MG PO TABS: 0.2500 mg | ORAL_TABLET | Freq: Three times a day (TID) | ORAL | 2 refills | Status: DC | PRN

## 2023-12-10 MED ORDER — OXYCODONE HCL 20 MG PO TABS: 20.0000 mg | ORAL_TABLET | Freq: Four times a day (QID) | ORAL | 0 refills | Status: DC

## 2023-12-10 NOTE — Progress Notes (Signed)
 Established Patient Office Visit  Subjective:  Patient ID: Lindsey [name], female    DOB: Jan 10, 1961  Age: 63 y.o. MRN: [id number]  No chief complaint on file.   Here for pain management follow up. Chronic pain well controlled on current analgesia. Last UDS satisfactory and pill counts have also been satisfactory. C/o numbness on the top of her foot where she broke 4 four toes last year.    No other concerns at this time.   Past Medical History:  Diagnosis Date   Allergy    Anemia    Arthritis    knees   CHF (congestive heart failure) (HCC)    COPD (chronic obstructive pulmonary disease) (HCC)    Diabetes mellitus without complication (HCC)    diet controlled   GERD (gastroesophageal reflux disease)    Heart attack (HCC) 2002   Hyperlipidemia    Hypertension    PUD (peptic ulcer disease)    Sleep apnea    can't tolerate CPAP.  Uses O2 only   Spinal cord stimulator status    for lower back pain   Stroke [facility]) 2004   mini-stroke   Vertigo    Wears dentures    full upper and lower    Past Surgical History:  Procedure Laterality Date   ABDOMINAL HYSTERECTOMY     back surgery     broken wrist     COLONOSCOPY WITH PROPOFOL  N/A 01/30/2017   Procedure: COLONOSCOPY WITH PROPOFOL ;  Surgeon: Ruel Kung, MD;  Location: [facility] ENDOSCOPY;  Service: Endoscopy;  Laterality: N/A;   COLONOSCOPY WITH PROPOFOL  N/A 02/20/2017   Procedure: Colonoscopy with propofol  ;  Surgeon: Ruel Kung, MD;  Location: [facility] ENDOSCOPY;  Service: Endoscopy;  Laterality: N/A;   COLONOSCOPY WITH PROPOFOL  N/A 03/13/2017   Procedure: COLONOSCOPY WITH PROPOFOL ;  Surgeon: Ruel Kung, MD;  Location: [facility] ENDOSCOPY;  Service: Endoscopy;  Laterality: N/A;   COLONOSCOPY WITH PROPOFOL  N/A 04/17/2017   Procedure: COLONOSCOPY WITH PROPOFOL ;  Surgeon: Kung Ruel, MD;  Location: [facility] ENDOSCOPY;  Service: Endoscopy;  Laterality: N/A;   ESOPHAGOGASTRODUODENOSCOPY N/A 05/05/2020   Procedure: ESOPHAGOGASTRODUODENOSCOPY  (EGD);  Surgeon: Toledo, Ladell POUR, MD;  Location: [facility] ENDOSCOPY;  Service: Gastroenterology;  Laterality: N/A;   ESOPHAGOGASTRODUODENOSCOPY (EGD) WITH PROPOFOL  N/A 09/28/2016   Procedure: ESOPHAGOGASTRODUODENOSCOPY (EGD) WITH PROPOFOL ;  Surgeon: Lamar ONEIDA Holmes, MD;  Location: [facility] ENDOSCOPY;  Service: Endoscopy;  Laterality: N/A;   ESOPHAGOGASTRODUODENOSCOPY (EGD) WITH PROPOFOL  N/A 03/13/2017   Procedure: ESOPHAGOGASTRODUODENOSCOPY (EGD) WITH PROPOFOL ;  Surgeon: Ruel Kung, MD;  Location: [facility] ENDOSCOPY;  Service: Endoscopy;  Laterality: N/A;   GALLBLADDER SURGERY     GASTRIC BYPASS     GIVENS CAPSULE STUDY N/A 03/13/2017   Procedure: GIVENS CAPSULE STUDY;  Surgeon: Ruel Kung, MD;  Location: [facility] ENDOSCOPY;  Service: Endoscopy;  Laterality: N/A;   HERNIA REPAIR     JOINT REPLACEMENT  1995   REPLACEMENT TOTAL KNEE     ROTATOR CUFF REPAIR      Social History   Socioeconomic History   Marital status: Widowed    Spouse name: Not on file   Number of children: Not on file   Years of education: Not on file   Highest education level: Not on file  Occupational History   Occupation: disabled  Tobacco Use   Smoking status: Never   Smokeless tobacco: Never  Vaping Use   Vaping status: Never Used  Substance and Sexual Activity   Alcohol use: No    Alcohol/week: 0.0 standard drinks of alcohol  Drug use: No   Sexual activity: Yes  Other Topics Concern   Not on file  Social History Narrative   Not on file   Social Drivers of Health   Financial Resource Strain: Low Risk  (07/20/2022)   Received from [facility]   Overall Financial Resource Strain (CARDIA)    Difficulty of Paying Living Expenses: Not hard at all  Food Insecurity: No Food Insecurity (07/20/2022)   Received from [facility]   Hunger Vital Sign    Worried About Running Out of Food in the Last Year: Never true    Ran Out of Food in the Last Year: Never true  Transportation  Needs: No Transportation Needs (07/20/2022)   Received from [facility], [facility]   [facility] - Transportation    Lack of Transportation (Medical): No    Lack of Transportation (Non-Medical): No  Physical Activity: Not on file  Stress: Not on file  Social Connections: Not on file  Intimate Partner Violence: Not on file    Family History  Problem Relation Age of Onset   Hypertension Father    COPD Father    Heart disease Father    Breast cancer Neg Hx     Allergies  Allergen Reactions   Morphine  Hives   Iodinated Contrast Media Hives   Aspirin Hives    Noted on MD progress notes and discussed with MD 09/02/15   Etodolac Hives   Ibuprofen Hives   Shellfish Allergy Hives   Tylenol  [Acetaminophen ] Hives    Upset stomach     Outpatient Medications Prior to Visit  Medication Sig   albuterol  (VENTOLIN  HFA) 108 (90 Base) MCG/ACT inhaler Inhale 2 puffs into the lungs every 4 (four) hours as needed for wheezing or shortness of breath.   apixaban  (ELIQUIS ) 5 MG TABS tablet Take 1 tablet (5 mg total) by mouth 2 (two) times daily.   atorvastatin  (LIPITOR) 40 MG tablet TAKE 1 TABLET(40 MG) BY MOUTH EVERY EVENING   baclofen  (LIORESAL ) 10 MG tablet TAKE 1 TABLET BY MOUTH 3 TIMES A DAY   busPIRone  (BUSPAR ) 7.5 MG tablet Take 1 tablet (7.5 mg total) by mouth 2 (two) times daily.   Calcium  Carbonate-Vitamin D  (OYSTER SHELL CALCIUM  500 + D PO) Take 1 tablet by mouth daily.   celecoxib (CELEBREX) 400 MG capsule Take 400 mg by mouth every morning.   cloNIDine  (CATAPRES ) 0.1 MG tablet TAKE 1 TABLET BY MOUTH AT BEDTIME   diclofenac  Sodium (VOLTAREN ) 1 % GEL APPLY 4 GRAMS TOPICALLY TO NECK TWICE DAILY   empagliflozin  (JARDIANCE ) 25 MG TABS tablet Take 1 tablet (25 mg total) by mouth daily.   FEROSUL 325 (65 Fe) MG tablet TAKE 1 TABLET BY MOUTH TWICE DAILY   fexofenadine  (ALLERGY RELIEF) 180 MG tablet Take 1 tablet (180 mg total) by mouth daily.   fluticasone  (FLONASE ) 50 MCG/ACT nasal  spray Place 1 spray into both nostrils daily.   folic acid  (FOLVITE ) 1 MG tablet Take 1 tablet (1 mg total) by mouth daily.   glucose blood (ONETOUCH VERIO) test strip TEST TWICE A DAY   hydrOXYzine  (ATARAX ) 25 MG tablet TAKE 1 TABLET BY MOUTH 3 TIMES A DAY   meclizine  (ANTIVERT ) 25 MG tablet TAKE 1 TABLET BY MOUTH THREE TIMES DAILY   metoprolol  succinate (TOPROL -XL) 100 MG 24 hr tablet TAKE 1 TABLET BY MOUTH EVERY DAY   mirtazapine  (REMERON ) 45 MG tablet Take 1 tablet (45 mg  total) by mouth at bedtime.   naloxone  (NARCAN ) nasal spray 4 mg/0.1 mL FOR SUSPECTED OPIOID OVERDOSE, ADMINISTER 1 SPRAY INTO ONE NOSTRIL. REPEAT IN OTHER NOSTRIL USING A NEW DEVICE AFTER 2-3 MINUTES IF NO OR MINIMAL RESPONSE. CALL 911 IF USED   NEXIUM  40 MG capsule Take 1 capsule (40 mg total) by mouth 2 (two) times daily.   OLMESARTAN  MEDOXOMIL PO Take by mouth.   oxybutynin  (DITROPAN ) 5 MG tablet TAKE 1 TABLET BY MOUTH TWICE DAILY   polyethylene glycol (MIRALAX  / GLYCOLAX ) 17 g packet Take 17 g by mouth daily as needed for mild constipation.   potassium chloride  (KLOR-CON ) 10 MEQ tablet Take 1 tablet (10 mEq total) by mouth daily.   sucralfate  (CARAFATE ) 1 GM/10ML suspension Take 10 mLs (1 g total) by mouth 4 (four) times daily -  with meals and at bedtime for 14 days.   Suvorexant  (BELSOMRA ) 20 MG TABS Take 1 tablet (20 mg total) by mouth at bedtime. TAKE 1 TABLET(20 MG) BY MOUTH AT BEDTIME   tirzepatide  (MOUNJARO ) 10 MG/0.5ML Pen INJECT 10 MG UNDER THE SKIN ONCE WEEKLY   TRESIBA  FLEXTOUCH 200 UNIT/ML FlexTouch Pen Inject 28 Units into the skin every morning.   UBRELVY  100 MG TABS Take 1 tablet (100 mg total) by mouth as needed.   [DISCONTINUED] ALPRAZolam  (XANAX ) 0.25 MG tablet TAKE 1 TABLET BY MOUTH 3 TIMES A DAY    No facility-administered medications prior to visit.    Review of Systems  HENT: Negative.    Eyes: Negative.   Respiratory:  Negative for cough.   Cardiovascular: Negative.   Gastrointestinal:  Negative.   Genitourinary: Negative.   Skin: Negative.   Neurological: Negative.   Endo/Heme/Allergies: Negative.   Psychiatric/Behavioral:  The patient has insomnia.        Objective:   BP 131/72   Pulse 78   Ht 5' 7 (1.702 m)   Wt 243 lb (110.2 kg)   SpO2 97%   BMI 38.06 kg/m   Vitals:   12/10/23 1032  BP: 131/72  Pulse: 78  Height: 5' 7 (1.702 m)  Weight: 243 lb (110.2 kg)  SpO2: 97%  BMI (Calculated): 38.05    Physical Exam Vitals reviewed.  Constitutional:      General: She is not in acute distress.    Appearance: She is obese.  HENT:     Head: Normocephalic.     Nose: Nose normal.     Mouth/Throat:     Mouth: Mucous membranes are moist.  Eyes:     Extraocular Movements: Extraocular movements intact.     Pupils: Pupils are equal, round, and reactive to light.  Cardiovascular:     Rate and Rhythm: Normal rate and regular rhythm.     Heart sounds: No murmur heard. Pulmonary:     Effort: Pulmonary effort is normal.     Breath sounds: No rhonchi or rales.  Abdominal:     General: Abdomen is flat.     Palpations: There is no hepatomegaly, splenomegaly or mass.  Musculoskeletal:        General: Normal range of motion.     Cervical back: Normal range of motion. No tenderness.  Skin:    General: Skin is warm and dry.  Neurological:     General: No focal deficit present.     Mental Status: She is alert and oriented to person, place, and time.     Cranial Nerves: No cranial nerve deficit.     Motor: No  weakness.  Psychiatric:        Mood and Affect: Mood normal.        Behavior: Behavior normal.      No results found for any visits on 12/10/23.      Assessment & Plan:  As per problem list. Problem List Items Addressed This Visit       Cardiovascular and Mediastinum   HTN (hypertension)     Endocrine   Type II diabetes mellitus with renal manifestations (HCC)   Relevant Orders   Ambulatory referral to Podiatry     Other   Other  chronic pain - Primary   Relevant Medications   Oxycodone  HCl 20 MG TABS   Other Relevant Orders   Drug Screen 13 with reflex Confirmation (AMP,BAR,BZO,COC,PCP,THC,OPI,OXY,MD,FEN,MEP,PPX,TRAM), Serum   Depression with anxiety   Relevant Medications   ALPRAZolam  (XANAX ) 0.25 MG tablet   Other Visit Diagnoses       Right foot pain       Relevant Orders   Ambulatory referral to Podiatry       Return in about 1 month (around 01/10/2024) for fu with labs prior.   Total time spent: 20 minutes  Sherrill Cinderella Perry, MD  12/10/2023   This document may have been prepared by [facility] Voice Recognition software and as such may include unintentional dictation errors.

## 2023-12-31 ENCOUNTER — Telehealth: Payer: Self-pay

## 2023-12-31 NOTE — Telephone Encounter (Signed)
Pt LM asking for call back but did not state what she needed

## 2024-01-05 DIAGNOSIS — J45998 Other asthma: Secondary | ICD-10-CM | POA: Diagnosis not present

## 2024-01-05 DIAGNOSIS — R0902 Hypoxemia: Secondary | ICD-10-CM | POA: Diagnosis not present

## 2024-01-07 ENCOUNTER — Emergency Department: Payer: 59

## 2024-01-07 ENCOUNTER — Other Ambulatory Visit: Payer: Self-pay

## 2024-01-07 ENCOUNTER — Emergency Department: Payer: 59 | Admitting: Anesthesiology

## 2024-01-07 ENCOUNTER — Encounter: Payer: Self-pay | Admitting: Emergency Medicine

## 2024-01-07 ENCOUNTER — Telehealth: Payer: Self-pay | Admitting: Emergency Medicine

## 2024-01-07 ENCOUNTER — Ambulatory Visit
Admission: EM | Admit: 2024-01-07 | Discharge: 2024-01-07 | Disposition: A | Discharge status: Home / Self Care | Payer: 59 | Attending: Emergency Medicine | Admitting: Emergency Medicine

## 2024-01-07 ENCOUNTER — Encounter
Admission: EM | Disposition: A | Discharge status: Home / Self Care | Payer: Self-pay | Source: Home / Self Care | Attending: Emergency Medicine

## 2024-01-07 DIAGNOSIS — M549 Dorsalgia, unspecified: Secondary | ICD-10-CM | POA: Diagnosis not present

## 2024-01-07 DIAGNOSIS — W44F3XA Food entering into or through a natural orifice, initial encounter: Secondary | ICD-10-CM | POA: Diagnosis not present

## 2024-01-07 DIAGNOSIS — T18108A Unspecified foreign body in esophagus causing other injury, initial encounter: Secondary | ICD-10-CM

## 2024-01-07 DIAGNOSIS — I509 Heart failure, unspecified: Secondary | ICD-10-CM | POA: Diagnosis not present

## 2024-01-07 DIAGNOSIS — K219 Gastro-esophageal reflux disease without esophagitis: Secondary | ICD-10-CM | POA: Diagnosis not present

## 2024-01-07 DIAGNOSIS — Z9981 Dependence on supplemental oxygen: Secondary | ICD-10-CM | POA: Diagnosis not present

## 2024-01-07 DIAGNOSIS — I252 Old myocardial infarction: Secondary | ICD-10-CM | POA: Diagnosis not present

## 2024-01-07 DIAGNOSIS — K449 Diaphragmatic hernia without obstruction or gangrene: Secondary | ICD-10-CM | POA: Diagnosis not present

## 2024-01-07 DIAGNOSIS — F419 Anxiety disorder, unspecified: Secondary | ICD-10-CM | POA: Diagnosis not present

## 2024-01-07 DIAGNOSIS — R0789 Other chest pain: Secondary | ICD-10-CM | POA: Diagnosis not present

## 2024-01-07 DIAGNOSIS — Z9884 Bariatric surgery status: Secondary | ICD-10-CM | POA: Insufficient documentation

## 2024-01-07 DIAGNOSIS — I2510 Atherosclerotic heart disease of native coronary artery without angina pectoris: Secondary | ICD-10-CM | POA: Insufficient documentation

## 2024-01-07 DIAGNOSIS — G4730 Sleep apnea, unspecified: Secondary | ICD-10-CM | POA: Diagnosis not present

## 2024-01-07 DIAGNOSIS — I110 Hypertensive heart disease with heart failure: Secondary | ICD-10-CM | POA: Diagnosis not present

## 2024-01-07 DIAGNOSIS — K222 Esophageal obstruction: Secondary | ICD-10-CM | POA: Diagnosis not present

## 2024-01-07 DIAGNOSIS — J449 Chronic obstructive pulmonary disease, unspecified: Secondary | ICD-10-CM | POA: Insufficient documentation

## 2024-01-07 DIAGNOSIS — I499 Cardiac arrhythmia, unspecified: Secondary | ICD-10-CM | POA: Diagnosis not present

## 2024-01-07 DIAGNOSIS — M542 Cervicalgia: Secondary | ICD-10-CM | POA: Diagnosis not present

## 2024-01-07 DIAGNOSIS — T18128A Food in esophagus causing other injury, initial encounter: Secondary | ICD-10-CM | POA: Diagnosis not present

## 2024-01-07 DIAGNOSIS — E119 Type 2 diabetes mellitus without complications: Secondary | ICD-10-CM | POA: Diagnosis not present

## 2024-01-07 DIAGNOSIS — Z743 Need for continuous supervision: Secondary | ICD-10-CM | POA: Diagnosis not present

## 2024-01-07 DIAGNOSIS — R918 Other nonspecific abnormal finding of lung field: Secondary | ICD-10-CM | POA: Diagnosis not present

## 2024-01-07 DIAGNOSIS — I69354 Hemiplegia and hemiparesis following cerebral infarction affecting left non-dominant side: Secondary | ICD-10-CM | POA: Diagnosis not present

## 2024-01-07 DIAGNOSIS — I130 Hypertensive heart and chronic kidney disease with heart failure and stage 1 through stage 4 chronic kidney disease, or unspecified chronic kidney disease: Secondary | ICD-10-CM | POA: Diagnosis not present

## 2024-01-07 DIAGNOSIS — J189 Pneumonia, unspecified organism: Secondary | ICD-10-CM

## 2024-01-07 DIAGNOSIS — F32A Depression, unspecified: Secondary | ICD-10-CM | POA: Diagnosis not present

## 2024-01-07 DIAGNOSIS — R079 Chest pain, unspecified: Secondary | ICD-10-CM | POA: Diagnosis not present

## 2024-01-07 DIAGNOSIS — N189 Chronic kidney disease, unspecified: Secondary | ICD-10-CM | POA: Diagnosis not present

## 2024-01-07 HISTORY — PX: ESOPHAGOGASTRODUODENOSCOPY (EGD) WITH PROPOFOL: SHX5813

## 2024-01-07 LAB — BASIC METABOLIC PANEL
Anion gap: 7 (ref 5–15)
BUN: 30 mg/dL — ABNORMAL HIGH (ref 8–23)
CO2: 23 mmol/L (ref 22–32)
Calcium: 8.3 mg/dL — ABNORMAL LOW (ref 8.9–10.3)
Chloride: 109 mmol/L (ref 98–111)
Creatinine, Ser: 1.23 mg/dL — ABNORMAL HIGH (ref 0.44–1.00)
GFR, Estimated: 49 mL/min — ABNORMAL LOW (ref >60)
Glucose, Bld: 108 mg/dL — ABNORMAL HIGH (ref 70–99)
Potassium: 4 mmol/L (ref 3.5–5.1)
Sodium: 139 mmol/L (ref 135–145)

## 2024-01-07 LAB — CBC
HCT: 47.4 % — ABNORMAL HIGH (ref 36.0–46.0)
Hemoglobin: 14.2 g/dL (ref 12.0–15.0)
MCH: 27.9 pg (ref 26.0–34.0)
MCHC: 30 g/dL (ref 30.0–36.0)
MCV: 93.1 fL (ref 80.0–100.0)
Platelets: 231 10*3/uL (ref 150–400)
RBC: 5.09 MIL/uL (ref 3.87–5.11)
RDW: 15 % (ref 11.5–15.5)
WBC: 8.2 10*3/uL (ref 4.0–10.5)
nRBC: 0 % (ref 0.0–0.2)

## 2024-01-07 LAB — CBG MONITORING, ED: Glucose-Capillary: 69 mg/dL — ABNORMAL LOW (ref 70–99)

## 2024-01-07 LAB — TROPONIN I (HIGH SENSITIVITY): Troponin I (High Sensitivity): 3 ng/L (ref <18)

## 2024-01-07 SURGERY — ESOPHAGOGASTRODUODENOSCOPY (EGD) WITH PROPOFOL
Anesthesia: General

## 2024-01-07 MED ORDER — ONDANSETRON HCL 4 MG/2ML IJ SOLN
INTRAMUSCULAR | Status: AC
  Filled 2024-01-07: qty 2

## 2024-01-07 MED ORDER — SUCCINYLCHOLINE CHLORIDE 200 MG/10ML IV SOSY
PREFILLED_SYRINGE | INTRAVENOUS | Status: AC
  Filled 2024-01-07: qty 10

## 2024-01-07 MED ORDER — DEXTROSE 50 % IV SOLN
50.0000 mL | INTRAVENOUS | Status: AC
  Administered 2024-01-07: 50 mL via INTRAVENOUS

## 2024-01-07 MED ORDER — FENTANYL CITRATE (PF) 100 MCG/2ML IJ SOLN
INTRAMUSCULAR | Status: AC
  Filled 2024-01-07: qty 2

## 2024-01-07 MED ORDER — CEPHALEXIN 500 MG PO CAPS: 500.0000 mg | ORAL_CAPSULE | Freq: Three times a day (TID) | ORAL | 0 refills | Status: AC

## 2024-01-07 MED ORDER — GLUCAGON HCL RDNA (DIAGNOSTIC) 1 MG IJ SOLR
1.0000 mg | Freq: Once | INTRAMUSCULAR | Status: AC
  Administered 2024-01-07: 1 mg via INTRAVENOUS
  Filled 2024-01-07: qty 1

## 2024-01-07 MED ORDER — SUGAMMADEX SODIUM 200 MG/2ML IV SOLN
INTRAVENOUS | Status: DC | PRN
  Administered 2024-01-07: 200 mg via INTRAVENOUS

## 2024-01-07 MED ORDER — ROCURONIUM BROMIDE 10 MG/ML (PF) SYRINGE
PREFILLED_SYRINGE | INTRAVENOUS | Status: AC
  Filled 2024-01-07: qty 10

## 2024-01-07 MED ORDER — AZITHROMYCIN 250 MG PO TABS: ORAL_TABLET | ORAL | 0 refills | Status: AC

## 2024-01-07 MED ORDER — ONDANSETRON HCL 4 MG/2ML IJ SOLN
INTRAMUSCULAR | Status: DC | PRN
  Administered 2024-01-07: 4 mg via INTRAVENOUS

## 2024-01-07 MED ORDER — PROPOFOL 10 MG/ML IV BOLUS
INTRAVENOUS | Status: DC | PRN
  Administered 2024-01-07: 10 mg via INTRAVENOUS

## 2024-01-07 MED ORDER — LIDOCAINE HCL (PF) 2 % IJ SOLN
INTRAMUSCULAR | Status: AC
  Filled 2024-01-07: qty 5

## 2024-01-07 MED ORDER — BENZONATATE 100 MG PO CAPS: 100.0000 mg | ORAL_CAPSULE | Freq: Four times a day (QID) | ORAL | 0 refills | Status: DC | PRN

## 2024-01-07 MED ORDER — SUCCINYLCHOLINE CHLORIDE 200 MG/10ML IV SOSY
PREFILLED_SYRINGE | INTRAVENOUS | Status: DC | PRN
  Administered 2024-01-07: 140 mg via INTRAVENOUS

## 2024-01-07 MED ORDER — DEXTROSE 50 % IV SOLN
INTRAVENOUS | Status: AC
  Filled 2024-01-07: qty 50

## 2024-01-07 MED ORDER — FENTANYL CITRATE PF 50 MCG/ML IJ SOSY
50.0000 ug | PREFILLED_SYRINGE | Freq: Once | INTRAMUSCULAR | Status: AC
  Administered 2024-01-07: 50 ug via INTRAVENOUS
  Filled 2024-01-07: qty 1

## 2024-01-07 MED ORDER — PROPOFOL 10 MG/ML IV BOLUS
INTRAVENOUS | Status: AC
  Filled 2024-01-07: qty 20

## 2024-01-07 MED ORDER — LIDOCAINE HCL (CARDIAC) PF 100 MG/5ML IV SOSY
PREFILLED_SYRINGE | INTRAVENOUS | Status: DC | PRN
  Administered 2024-01-07: 80 mg via INTRAVENOUS

## 2024-01-07 MED ORDER — SODIUM CHLORIDE 0.9 % IV SOLN: INTRAVENOUS | Status: DC

## 2024-01-07 MED ORDER — FENTANYL CITRATE (PF) 100 MCG/2ML IJ SOLN
INTRAMUSCULAR | Status: DC | PRN
  Administered 2024-01-07: 50 ug via INTRAVENOUS

## 2024-01-07 MED ORDER — ROCURONIUM BROMIDE 100 MG/10ML IV SOLN
INTRAVENOUS | Status: DC | PRN
  Administered 2024-01-07: 5 mg via INTRAVENOUS

## 2024-01-07 NOTE — ED Notes (Signed)
 See triage note  Presents with some lower back pain which moves up into back of her head  No fever or n/v Also feels like her pills are getting stuck since Friday

## 2024-01-07 NOTE — ED Triage Notes (Signed)
 Patient BIB EMS for evaluation of chest pain.  Reports pain started on Friday.  Having pain in back and neck.  States when she takes oral medications, "they feel like they are getting stuck."  C/o headache.  No reports of fevers.

## 2024-01-07 NOTE — Op Note (Signed)
 [facility] Patient Name: Lindsey [name] Procedure Date: 01/07/2024 1:59 PM MRN: [id number] Account #: [id number] Date of Birth: 1961-09-18 Admit Type: Outpatient Age: 63 Room: [facility] ENDO ROOM 3 Gender: Female Note Status: Finalized Instrument Name: Cristino Donna Endoscope [id number] Procedure:             Upper GI endoscopy Indications:           Foreign body in the esophagus Providers:             Marnee Sink MD, MD Referring MD:          Larissa Plowman. Donley Furth, MD (Referring MD) Medicines:             General Anesthesia Complications:         No immediate complications. Procedure:             Pre-Anesthesia Assessment:                        - Prior to the procedure, a History and Physical was                         performed, and patient medications and allergies were                         reviewed. The patient's tolerance of previous                         anesthesia was also reviewed. The risks and benefits                         of the procedure and the sedation options and risks                         were discussed with the patient. All questions were                         answered, and informed consent was obtained. Prior                         Anticoagulants: The patient has taken no anticoagulant                         or antiplatelet agents. ASA Grade Assessment: II - A                         patient with mild systemic disease. After reviewing                         the risks and benefits, the patient was deemed in                         satisfactory condition to undergo the procedure.                        After obtaining informed consent, the endoscope was                         passed under direct vision. Throughout the procedure,  the patient's blood pressure, pulse, and oxygen                          saturations were monitored continuously. The Endoscope                         was introduced through the  mouth, and advanced to the                         jejunum. The upper GI endoscopy was accomplished                         without difficulty. The patient tolerated the                         procedure well. Findings:      A small hiatal hernia was present.      Evidence of a gastric bypass was found. A gastric pouch was found. The       excluded stomach was examined and was characterized by healthy appearing       mucosa.      The examined jejunum was normal. Impression:            - Small hiatal hernia.                        - Gastric bypass.                        - Normal examined jejunum.                        - No specimens collected.                        - No food bolus in the esophagus. Recommendation:        - Discharge patient to home.                        - Resume previous diet.                        - Continue present medications. Procedure Code(s):     --- Professional ---                        [phone], Esophagogastroduodenoscopy, flexible,                         transoral; diagnostic, including collection of                         specimen(s) by brushing or washing, when performed                         (separate procedure) Diagnosis Code(s):     --- Professional ---                        T18.108A, Unspecified foreign body in esophagus                         causing other injury,  initial encounter CPT copyright 2022 American Medical Association. All rights reserved. The codes documented in this report are preliminary and upon coder review may  be revised to meet current compliance requirements. Marnee Sink MD, MD 01/07/2024 2:10:20 PM This report has been signed electronically. Number of Addenda: 0 Note Initiated On: 01/07/2024 1:59 PM Estimated Blood Loss:  Estimated blood loss: none.      [facility]

## 2024-01-07 NOTE — Telephone Encounter (Signed)
 Adding cough suppressant

## 2024-01-07 NOTE — Transfer of Care (Signed)
 Immediate Anesthesia Transfer of Care Note  Patient: Lindsey [name]  Procedure(s) Performed: ESOPHAGOGASTRODUODENOSCOPY (EGD) WITH PROPOFOL   Patient Location: PACU  Anesthesia Type:MAC and General  Level of Consciousness: awake, alert , and drowsy  Airway & Oxygen  Therapy: Patient Spontanous Breathing and Patient connected to face mask oxygen   Post-op Assessment: Report given to RN and Post -op Vital signs reviewed and stable  Post vital signs: Reviewed and stable  Last Vitals:  Vitals Value Taken Time  BP    Temp    Pulse    Resp    SpO2      Last Pain:  Vitals:   01/07/24 1314  TempSrc: Temporal  PainSc: 8          Complications: There were no known notable events for this encounter.

## 2024-01-07 NOTE — Anesthesia Procedure Notes (Signed)
 Procedure Name: Intubation Date/Time: 01/07/2024 2:08 PM  Performed by: Liston Riedel, CindyPre-anesthesia Checklist: Patient identified, Emergency Drugs available, Suction available, Patient being monitored and Timeout performed Patient Re-evaluated:Patient Re-evaluated prior to induction Oxygen  Delivery Method: Circle system utilized Induction Type: IV induction Ventilation: Mask ventilation without difficulty Laryngoscope Size: Miller and 3 Grade View: Grade II Tube type: Oral Tube size: 7.0 mm Number of attempts: 1 Airway Equipment and Method: Stylet and Bite block Placement Confirmation: ETT inserted through vocal cords under direct vision, positive ETCO2, CO2 detector and breath sounds checked- equal and bilateral Secured at: 21 cm Tube secured with: Tape Dental Injury: Teeth and Oropharynx as per pre-operative assessment

## 2024-01-07 NOTE — ED Provider Notes (Signed)
 [facility] Provider Note    Event Date/Time   First MD Initiated Contact with Patient 01/07/24 [phone]     (approximate)   History   Something stuck in my throat   HPI  Lindsey [name] is a 63 y.o. female with a history of esophageal dilatations presents with complaints of something lodged in her throat.  She reports this occurred likely 2 days ago, she has had a sensation of something stuck in her throat since then, she does not know if it is pill or food.  When she tries to eat she immediately throws it up, when she tries to drink it comes up as well.  Additionally she reports she has been having a cough but denies shortness of breath     Physical Exam   Triage Vital Signs: ED Triage Vitals  Encounter Vitals Group     BP 01/07/24 0445 126/85     Systolic BP Percentile --      Diastolic BP Percentile --      Pulse Rate 01/07/24 0445 (!) 104     Resp 01/07/24 0445 18     Temp 01/07/24 0445 98.2 F (36.8 C)     Temp Source 01/07/24 0445 Oral     SpO2 01/07/24 0444 100 %     Weight 01/07/24 0446 113.4 kg (250 lb)     Height 01/07/24 0446 1.702 m (5\' 7" )     Head Circumference --      Peak Flow --      Pain Score 01/07/24 0446 10     Pain Loc --      Pain Education --      Exclude from Growth Chart --     Most recent vital signs: Vitals:   01/07/24 0848 01/07/24 1314  BP: 122/80 128/82  Pulse: 99 72  Resp: 18 18  Temp: 98 F (36.7 C) (!) 96.3 F (35.7 C)  SpO2: 100% 100%     General: Awake, no distress.  CV:  Good peripheral perfusion.  Resp:  Normal effort.  No wheezing on exam Abd:  No distention.  Other:  Pharynx normal   ED Results / Procedures / Treatments   Labs (all labs ordered are listed, but only abnormal results are displayed) Labs Reviewed  BASIC METABOLIC PANEL - Abnormal; Notable for the following components:      Result Value   Glucose, Bld 108 (*)    BUN 30 (*)    Creatinine, Ser 1.23 (*)    Calcium  8.3 (*)     GFR, Estimated 49 (*)    All other components within normal limits  CBC - Abnormal; Notable for the following components:   HCT 47.4 (*)    All other components within normal limits  CBG MONITORING, ED - Abnormal; Notable for the following components:   Glucose-Capillary 69 (*)    All other components within normal limits  TROPONIN I (HIGH SENSITIVITY)     EKG  ED ECG REPORT I, Bryson Carbine, the attending physician, personally viewed and interpreted this ECG.  Date: 01/07/2024  Rhythm: normal sinus rhythm QRS Axis: normal Intervals: normal ST/T Wave abnormalities: normal Narrative Interpretation: no evidence of acute ischemia    RADIOLOGY Chest x-ray viewed interpret by me with questionable early pneumonia    PROCEDURES:  Critical Care performed:   Procedures   MEDICATIONS ORDERED IN ED: Medications  0.9 %  sodium chloride  infusion ( Intravenous Continued from Pre-op 01/07/24 1356)  glucagon  (human  recombinant) (GLUCAGEN ) injection 1 mg (1 mg Intravenous Given 01/07/24 0857)  fentaNYL  (SUBLIMAZE ) injection 50 mcg (50 mcg Intravenous Given 01/07/24 0856)  dextrose  50 % solution 50 mL (50 mLs Intravenous Given 01/07/24 1313)     IMPRESSION / MDM / ASSESSMENT AND PLAN / ED COURSE  I reviewed the triage vital signs and the nursing notes. Patient's presentation is most consistent with acute presentation with potential threat to life or bodily function.  Patient presents with likely esophageal food impaction given description of her symptoms and her history of requiring esophageal dilatation.  All of drinking soda failed, will give glucagon  and try again if no success will consult GI  Questionable pneumonia on chest x-ray, given her developing cough suspicious for possible bronchopneumonia, will treat with antibiotics.  Consulted Dr. Ole Berkeley of GI, he will arrange for endoscopy for the patient for presumed esophageal food impaction      FINAL CLINICAL  IMPRESSION(S) / ED DIAGNOSES   Final diagnoses:  Esophageal obstruction due to food impaction  Pneumonia due to infectious organism, unspecified laterality, unspecified part of lung     Rx / DC Orders   ED Discharge Orders          Ordered    cephALEXin  (KEFLEX ) 500 MG capsule  3 times daily        01/07/24 0921    azithromycin  (ZITHROMAX  Z-PAK) 250 MG tablet        01/07/24 0921    Increase activity slowly        01/07/24 1411    Diet - low sodium heart healthy        01/07/24 1411    Discharge instructions       Comments: Marnee Sink, MD, [facility] [address] [place], [place] [address] Phone: [phone] Fax : [phone]   YOU HAD AN ENDOSCOPIC PROCEDURE TODAY: Refer to the procedure report that was given to you for any specific questions about what was found during the examination.  If the procedure report does not answer your questions, please call your gastroenterologist to clarify.  YOU SHOULD EXPECT: Some feelings of bloating in the abdomen. Passage of more gas than usual.  Walking can help get rid of the air that was put into your GI tract during the procedure and reduce the bloating. If you had a lower endoscopy (such as a colonoscopy or flexible sigmoidoscopy) you may notice spotting of blood in your stool or on the toilet paper.   DIET: Your first meal following the procedure should be a light meal and then it is ok to progress to your normal diet.  A half-sandwich or bowl of soup is an example of a good first meal.  Heavy or fried foods are harder to digest and may make you feel nasueas or bloated.  Drink plenty of fluids but you should avoid alcoholic beverages for 24 hours.  ACTIVITY: Your care partner should take you home directly after the procedure.  You should plan to take it easy, moving slowly for the rest of the day.  You can resume normal activity the day after the procedure however you should NOT DRIVE, make legal decisions or use heavy machinery for  24 hours (because of the sedation medicines used during the test).    SYMPTOMS TO REPORT IMMEDIATELY  A gastroenterologist can be reached at any hour.  Please call your doctor's office for any of the following symptoms:  Following lower endoscopy (colonoscopy, flexible sigmoidoscopy)  Excessive amounts of blood in  the stool  Significant tenderness, worsening of abdominal pains  Swelling of the abdomen that is new, acute  Fever of 100 or higher Following upper endoscopy (EGD, EUS, ERCP)  Vomiting of blood or coffee ground material  New, significant abdominal pain  New, significant chest pain or pain under the shoulder blades  Painful or persistently difficult swallowing  New shortness of breath  Black, tarry-looking stools  FOLLOW UP: If any biopsies were taken you will be contacted by phone or by letter within the next 1-3 weeks.  Call your gastroenterologist if you have not heard about the biopsies in 3 weeks.   Please also call your gastroenterologist's office with any specific questions about appointments or follow up tests.   01/07/24 1411             Note:  This document was prepared using Dragon voice recognition software and may include unintentional dictation errors.   Bryson Carbine, MD 01/07/24 [phone]

## 2024-01-07 NOTE — Anesthesia Preprocedure Evaluation (Addendum)
 Anesthesia Evaluation  Patient identified by MRN, date of birth, ID band Patient awake    Reviewed: Allergy & Precautions, H&P , NPO status , Patient's Chart, lab work & pertinent test results  Airway Mallampati: II  TM Distance: >3 FB Neck ROM: full    Dental  (+) Edentulous Lower, Edentulous Upper   Pulmonary shortness of breath, sleep apnea , COPD (2L [place]),  oxygen  dependent   Pulmonary exam normal        Cardiovascular hypertension, + CAD, + Past MI (2002) and +CHF  Normal cardiovascular exam  2022 echo: EF normal and diastolic function indeterminate   Neuro/Psych  PSYCHIATRIC DISORDERS Anxiety Depression    CVA (Left arm weakness), Residual Symptoms    GI/Hepatic Neg liver ROS,GERD  ,,  Endo/Other  diabetes, Type 2    Renal/GU Renal InsufficiencyRenal disease     Musculoskeletal  (+) Arthritis ,    Abdominal  (+) + obese  Peds  Hematology negative hematology ROS (+)   Anesthesia Other Findings Past Medical History: No date: Allergy No date: Anemia No date: Arthritis     Comment:  knees No date: CHF (congestive heart failure) (HCC) No date: COPD (chronic obstructive pulmonary disease) (HCC) No date: Diabetes mellitus without complication (HCC)     Comment:  diet controlled No date: GERD (gastroesophageal reflux disease) 2002: Heart attack (HCC) No date: Hyperlipidemia No date: Hypertension No date: PUD (peptic ulcer disease) No date: Sleep apnea     Comment:  can't tolerate CPAP.  Uses O2 only No date: Spinal cord stimulator status     Comment:  for lower back pain 2004: Stroke [facility])     Comment:  "mini-stroke" No date: Vertigo No date: Wears dentures     Comment:  full upper and lower  Past Surgical History: No date: ABDOMINAL HYSTERECTOMY No date: back surgery No date: broken wrist 01/30/2017: COLONOSCOPY WITH PROPOFOL ; N/A     Comment:  Procedure: COLONOSCOPY WITH PROPOFOL ;  Surgeon: Luke Salaam, MD;  Location: [facility] ENDOSCOPY;  Service: Endoscopy;              Laterality: N/A; 02/20/2017: COLONOSCOPY WITH PROPOFOL ; N/A     Comment:  Procedure: Colonoscopy with propofol  ;  Surgeon: Luke Salaam, MD;  Location: [facility] ENDOSCOPY;  Service: Endoscopy;              Laterality: N/A; 03/13/2017: COLONOSCOPY WITH PROPOFOL ; N/A     Comment:  Procedure: COLONOSCOPY WITH PROPOFOL ;  Surgeon: Luke Salaam, MD;  Location: [facility] ENDOSCOPY;  Service: Endoscopy;              Laterality: N/A; 04/17/2017: COLONOSCOPY WITH PROPOFOL ; N/A     Comment:  Procedure: COLONOSCOPY WITH PROPOFOL ;  Surgeon: Luke Salaam, MD;  Location: [facility] ENDOSCOPY;  Service:               Endoscopy;  Laterality: N/A; 05/05/2020: ESOPHAGOGASTRODUODENOSCOPY; N/A     Comment:  Procedure: ESOPHAGOGASTRODUODENOSCOPY (EGD);  Surgeon:               Toledo, Alphonsus Jeans, MD;  Location: [facility] ENDOSCOPY;  Service: Gastroenterology;  Laterality: N/A; 09/28/2016: ESOPHAGOGASTRODUODENOSCOPY (EGD) WITH PROPOFOL ; N/A     Comment:  Procedure: ESOPHAGOGASTRODUODENOSCOPY (EGD) WITH               PROPOFOL ;  Surgeon: Cassie Click, MD;  Location: [facility]              ENDOSCOPY;  Service: Endoscopy;  Laterality: N/A; 03/13/2017: ESOPHAGOGASTRODUODENOSCOPY (EGD) WITH PROPOFOL ; N/A     Comment:  Procedure: ESOPHAGOGASTRODUODENOSCOPY (EGD) WITH               PROPOFOL ;  Surgeon: Luke Salaam, MD;  Location: [facility]               ENDOSCOPY;  Service: Endoscopy;  Laterality: N/A; No date: GALLBLADDER SURGERY No date: GASTRIC BYPASS 03/13/2017: GIVENS CAPSULE STUDY; N/A     Comment:  Procedure: GIVENS CAPSULE STUDY;  Surgeon: Luke Salaam,               MD;  Location: [facility] ENDOSCOPY;  Service: Endoscopy;                Laterality: N/A; No date: HERNIA REPAIR 1995: JOINT REPLACEMENT No date: REPLACEMENT TOTAL KNEE No date: ROTATOR CUFF REPAIR  BMI    Body Mass Index: 39.16 kg/m       Reproductive/Obstetrics negative OB ROS                              Anesthesia Physical Anesthesia Plan  ASA: 3  Anesthesia Plan: General ETT and General   Post-op Pain Management: Minimal or no pain anticipated   Induction: Intravenous and Rapid sequence  PONV Risk Score and Plan: 2 and Ondansetron   Airway Management Planned: Oral ETT  Additional Equipment:   Intra-op Plan:   Post-operative Plan: Extubation in OR  Informed Consent: I have reviewed the patients History and Physical, chart, labs and discussed the procedure including the risks, benefits and alternatives for the proposed anesthesia with the patient or authorized representative who has indicated his/her understanding and acceptance.     Dental Advisory Given  Plan Discussed with: CRNA and Surgeon  Anesthesia Plan Comments:          Anesthesia Quick Evaluation

## 2024-01-08 ENCOUNTER — Encounter: Payer: 59 | Admitting: Internal Medicine

## 2024-01-08 ENCOUNTER — Encounter: Payer: Self-pay | Admitting: Gastroenterology

## 2024-01-08 ENCOUNTER — Telehealth: Payer: Self-pay

## 2024-01-08 ENCOUNTER — Other Ambulatory Visit: Payer: Self-pay

## 2024-01-08 DIAGNOSIS — G8929 Other chronic pain: Secondary | ICD-10-CM

## 2024-01-08 MED ORDER — OXYBUTYNIN CHLORIDE 5 MG PO TABS: 5.0000 mg | ORAL_TABLET | Freq: Two times a day (BID) | ORAL | 3 refills | Status: DC

## 2024-01-08 MED ORDER — OXYCODONE HCL 20 MG PO TABS
20.0000 mg | ORAL_TABLET | Freq: Four times a day (QID) | ORAL | 0 refills | Status: DC
Start: 2024-01-08 — End: 2024-02-06

## 2024-01-08 NOTE — Telephone Encounter (Signed)
PT lm asking for call back

## 2024-01-08 NOTE — Telephone Encounter (Signed)
Spoke with patient she had questions about her PM appt for next month

## 2024-01-09 ENCOUNTER — Ambulatory Visit: Payer: Self-pay

## 2024-01-09 NOTE — Anesthesia Postprocedure Evaluation (Signed)
Anesthesia Post Note  Patient: Lindsey [name]  Procedure(s) Performed: ESOPHAGOGASTRODUODENOSCOPY (EGD) WITH PROPOFOL  Patient location during evaluation: Endoscopy Anesthesia Type: General Level of consciousness: awake and alert Pain management: pain level controlled Vital Signs Assessment: post-procedure vital signs reviewed and stable Respiratory status: spontaneous breathing, nonlabored ventilation, respiratory function stable and patient connected to nasal cannula oxygen Cardiovascular status: blood pressure returned to baseline and stable Postop Assessment: no apparent nausea or vomiting Anesthetic complications: no   There were no known notable events for this encounter.   Last Vitals:  Vitals:   01/07/24 1314 01/07/24 1435  BP: 128/82 125/81  Pulse: 72 97  Resp: 18 19  Temp: (!) 35.7 C   SpO2: 100% 98%    Last Pain:  Vitals:   01/08/24 0745  TempSrc:   PainSc: 6                  Lenard Simmer

## 2024-01-09 NOTE — Telephone Encounter (Signed)
Message from Byron C sent at 01/09/2024  8:02 AM EST  Summary: stomach discomfort / rx concern / rx req   The patient has called on the community line  The patient shares that they are currently taking cephALEXin (KEFLEX) 500 MG capsule [[id number]] and experiencing significant stomach discomfort  The patient feels that the medication is causing them to experience nausea, diarrhea and general stomach discomfort for roughly 3 days  The patient would like to be contacted by a member of clinical staff when possible to discuss their concerns further  The patient would like to be prescribed something to help with their stomach discomfort and nausea  Please contact when available         Chief Complaint: N/V/D  Symptoms: epigastric pain, headache Frequency: Monday Pertinent Negatives: Patient denies dizziness, coffee ground or bloody emesis Disposition: [x] ED /[] Urgent Care (no appt availability in office) / [] Appointment(In office/virtual)/ []  [facility]/ [] Home Care/ [] Refused Recommended Disposition /[] [facility]/ []  Follow-up with PCP Additional Notes: Advised pt to go to ED- advised unable to make appt for her, but is not warranted in her case. Advised she can call her PCP for further advice. Pt agreeable.   Reason for Disposition  [1] SEVERE vomiting (e.g., 6 or more times/day) AND [2] present > 8 hours (Exception: Patient sounds well, is drinking liquids, does not sound dehydrated, and vomiting has lasted less than 24 hours.)  Answer Assessment - Initial Assessment Questions 1. VOMITING SEVERITY: "How many times have you vomited in the past 24 hours?"     - MILD:  1 - 2 times/day    - MODERATE: 3 - 5 times/day, decreased oral intake without significant weight loss or symptoms of dehydration    - SEVERE: 6 or more times/day, vomits everything or nearly everything, with significant weight loss, symptoms of dehydration      severe 2. ONSET: "When did the  vomiting begin?"      Monday  3. FLUIDS: "What fluids or food have you vomited up today?" "Have you been able to keep any fluids down?"     no 4. ABDOMEN PAIN: "Are your having any abdomen pain?" If Yes : "How bad is it and what does it feel like?" (e.g., crampy, dull, intermittent, constant)     Epigastric area 5. DIARRHEA: "Is there any diarrhea?" If Yes, ask: "How many times today?"      yes 7. CAUSE: "What do you think is causing your vomiting?"     Keflex 8. HYDRATION STATUS: "Any signs of dehydration?" (e.g., dry mouth [not only dry lips], too weak to stand) "When did you last urinate?"     Headache/20 min ago  9. OTHER SYMPTOMS: "Do you have any other symptoms?" (e.g., fever, headache, vertigo, vomiting blood or coffee grounds, recent head injury)     Headache,  Protocols used: Vomiting-A-AH

## 2024-01-10 ENCOUNTER — Telehealth: Payer: Self-pay

## 2024-01-10 NOTE — Telephone Encounter (Signed)
Pt LM asking for call back, did not state what was needed

## 2024-01-11 ENCOUNTER — Ambulatory Visit: Payer: Medicare Other | Admitting: Podiatry

## 2024-01-14 ENCOUNTER — Other Ambulatory Visit: Payer: Self-pay | Admitting: Internal Medicine

## 2024-01-22 ENCOUNTER — Ambulatory Visit (INDEPENDENT_AMBULATORY_CARE_PROVIDER_SITE_OTHER): Payer: 59 | Admitting: Internal Medicine

## 2024-01-22 ENCOUNTER — Ambulatory Visit (INDEPENDENT_AMBULATORY_CARE_PROVIDER_SITE_OTHER): Payer: 59 | Admitting: Podiatry

## 2024-01-22 ENCOUNTER — Encounter: Payer: Self-pay | Admitting: Internal Medicine

## 2024-01-22 ENCOUNTER — Encounter: Payer: Self-pay | Admitting: Podiatry

## 2024-01-22 VITALS — BP 126/78 | HR 94 | Temp 97.8°F | Ht 67.0 in | Wt 248.0 lb

## 2024-01-22 DIAGNOSIS — E1122 Type 2 diabetes mellitus with diabetic chronic kidney disease: Secondary | ICD-10-CM | POA: Diagnosis not present

## 2024-01-22 DIAGNOSIS — Z794 Long term (current) use of insulin: Secondary | ICD-10-CM

## 2024-01-22 DIAGNOSIS — E119 Type 2 diabetes mellitus without complications: Secondary | ICD-10-CM | POA: Diagnosis not present

## 2024-01-22 DIAGNOSIS — I10 Essential (primary) hypertension: Secondary | ICD-10-CM

## 2024-01-22 DIAGNOSIS — N1831 Chronic kidney disease, stage 3a: Secondary | ICD-10-CM

## 2024-01-22 LAB — POCT CBG (FASTING - GLUCOSE)-MANUAL ENTRY: Glucose Fasting, POC: 132 mg/dL — AB (ref 70–99)

## 2024-01-22 MED ORDER — POTASSIUM CHLORIDE ER 10 MEQ PO TBCR: 10.0000 meq | EXTENDED_RELEASE_TABLET | Freq: Every day | ORAL | 1 refills | Status: DC

## 2024-01-22 NOTE — Progress Notes (Signed)
 Chief Complaint  Patient presents with   Diabetes    "I had broken the toes on my right foot, now they are dark.  I also have numbness on top of my foot.  I need my toenails done.  I'm Diabetic." N - diabetic foot check L - 2,3,4 right, dorsal right, 1-5 bilateral toenails D - 2 mos. O - suddenly C - numbness, discoloration toes, discolored and thick toenails,  A - shoes T - none    HPI: 63 y.o. female presenting today as a new patient for evaluation of mild discoloration to the right foot.  Patient is diabetic and she says that she had a brother ended up with a amputation of his right lower extremity and shortly thereafter he passed away.  She is concerned about her foot and would like to have them evaluate.  Past Medical History:  Diagnosis Date   Allergy    Anemia    Arthritis    knees   CHF (congestive heart failure) (HCC)    COPD (chronic obstructive pulmonary disease) (HCC)    Diabetes mellitus without complication (HCC)    diet controlled   GERD (gastroesophageal reflux disease)    Heart attack (HCC) 2002   Hyperlipidemia    Hypertension    PUD (peptic ulcer disease)    Sleep apnea    can't tolerate CPAP.  Uses O2 only   Spinal cord stimulator status    for lower back pain   Stroke [facility]) 2004   "mini-stroke"   Vertigo    Wears dentures    full upper and lower    Past Surgical History:  Procedure Laterality Date   ABDOMINAL HYSTERECTOMY     back surgery     broken wrist     COLONOSCOPY WITH PROPOFOL N/A 01/30/2017   Procedure: COLONOSCOPY WITH PROPOFOL;  Surgeon: Wyline Mood, MD;  Location: [facility] ENDOSCOPY;  Service: Endoscopy;  Laterality: N/A;   COLONOSCOPY WITH PROPOFOL N/A 02/20/2017   Procedure: Colonoscopy with propofol ;  Surgeon: Wyline Mood, MD;  Location: [facility] ENDOSCOPY;  Service: Endoscopy;  Laterality: N/A;   COLONOSCOPY WITH PROPOFOL N/A 03/13/2017   Procedure: COLONOSCOPY WITH PROPOFOL;  Surgeon: Wyline Mood, MD;  Location: [facility] ENDOSCOPY;  Service:  Endoscopy;  Laterality: N/A;   COLONOSCOPY WITH PROPOFOL N/A 04/17/2017   Procedure: COLONOSCOPY WITH PROPOFOL;  Surgeon: Wyline Mood, MD;  Location: [facility] ENDOSCOPY;  Service: Endoscopy;  Laterality: N/A;   ESOPHAGOGASTRODUODENOSCOPY N/A 05/05/2020   Procedure: ESOPHAGOGASTRODUODENOSCOPY (EGD);  Surgeon: Toledo, Boykin Nearing, MD;  Location: [facility] ENDOSCOPY;  Service: Gastroenterology;  Laterality: N/A;   ESOPHAGOGASTRODUODENOSCOPY (EGD) WITH PROPOFOL N/A 09/28/2016   Procedure: ESOPHAGOGASTRODUODENOSCOPY (EGD) WITH PROPOFOL;  Surgeon: Scot Jun, MD;  Location: [facility] ENDOSCOPY;  Service: Endoscopy;  Laterality: N/A;   ESOPHAGOGASTRODUODENOSCOPY (EGD) WITH PROPOFOL N/A 03/13/2017   Procedure: ESOPHAGOGASTRODUODENOSCOPY (EGD) WITH PROPOFOL;  Surgeon: Wyline Mood, MD;  Location: [facility] ENDOSCOPY;  Service: Endoscopy;  Laterality: N/A;   ESOPHAGOGASTRODUODENOSCOPY (EGD) WITH PROPOFOL N/A 01/07/2024   Procedure: ESOPHAGOGASTRODUODENOSCOPY (EGD) WITH PROPOFOL;  Surgeon: Midge Minium, MD;  Location[facility] END[facility]COPY;  Service: Endoscopy;  Laterality: N/A;   GALLBLADDER SURGERY     GASTRIC BYPASS     GIVENS CAPSULE STUDY N/A 03/13/2017   Procedure: GIVENS CAPSULE STUDY;  Surgeon: Wyline Mood, MD;  Location: [facility] ENDOSCOPY;  Service: Endoscopy;  Laterality: N/A;   HERNIA REPAIR     JOINT REPLACEMENT  1995   REPLACEMENT TOTAL KNEE     ROTATOR CUFF REPAIR  Allergies  Allergen Reactions   Morphine Hives   Iodinated Contrast Media Hives   Aspirin Hives    Noted on MD progress notes and discussed with MD 09/02/15   Etodolac Hives   Ibuprofen Hives   Shellfish Allergy Hives   Tylenol [Acetaminophen] Hives    Upset stomach      Physical Exam: General: The patient is alert and oriented x3 in no acute distress.  Dermatology: Skin is warm, dry and supple bilateral lower extremities.   Vascular: Palpable pedal pulses bilaterally. Capillary refill within normal limits.  No appreciable edema.  No  erythema.  Neurological: Grossly intact via light touch  Musculoskeletal Exam: No pedal deformities noted   Assessment/Plan of Care: 1.  Diabetes mellitus with peripheral polyneuropathy; controlled 2.  Onychomycosis of toenails bilateral  -Patient evaluated -Comprehensive diabetic foot exam performed today.  Patient has palpable pulses with no complicating factors to the feet.  Reassured the patient that currently her feet are in good health -Continue to work closely with her PCP for diabetes management which currently her diabetes is controlled -Patient states that she currently has no time to stay and have her nails debrided today.  She would like to make a follow-up appointment.  Appointment with Dr. Donzetta Matters for routine footcare       Felecia Shelling, DPM [facility]  Dr. Felecia Shelling, DPM    [address] [place], [place] [address]                Office [phone]  Fax [phone]

## 2024-01-22 NOTE — Progress Notes (Signed)
 Established Patient Office Visit  Subjective:  Patient ID: Lindsey [name], female    DOB: [birth year]  Age: 63 y.o. MRN: [id number]  No chief complaint on file.   Hospital f/u for pneumonia, still c/o occasional SOB and cough.    No other concerns at this time.   Past Medical History:  Diagnosis Date   Allergy    Anemia    Arthritis    knees   CHF (congestive heart failure) (HCC)    COPD (chronic obstructive pulmonary disease) (HCC)    Diabetes mellitus without complication (HCC)    diet controlled   GERD (gastroesophageal reflux disease)    Heart attack (HCC) 2002   Hyperlipidemia    Hypertension    PUD (peptic ulcer disease)    Sleep apnea    can't tolerate CPAP.  Uses O2 only   Spinal cord stimulator status    for lower back pain   Stroke [facility]) 2004   "mini-stroke"   Vertigo    Wears dentures    full upper and lower    Past Surgical History:  Procedure Laterality Date   ABDOMINAL HYSTERECTOMY     back surgery     broken wrist     COLONOSCOPY WITH PROPOFOL N/A 01/30/2017   Procedure: COLONOSCOPY WITH PROPOFOL;  Surgeon: Wyline Mood, MD;  Location: [facility] ENDOSCOPY;  Service: Endoscopy;  Laterality: N/A;   COLONOSCOPY WITH PROPOFOL N/A 02/20/2017   Procedure: Colonoscopy with propofol ;  Surgeon: Wyline Mood, MD;  Location: [facility] ENDOSCOPY;  Service: Endoscopy;  Laterality: N/A;   COLONOSCOPY WITH PROPOFOL N/A 03/13/2017   Procedure: COLONOSCOPY WITH PROPOFOL;  Surgeon: Wyline Mood, MD;  Location: [facility] ENDOSCOPY;  Service: Endoscopy;  Laterality: N/A;   COLONOSCOPY WITH PROPOFOL N/A 04/17/2017   Procedure: COLONOSCOPY WITH PROPOFOL;  Surgeon: Wyline Mood, MD;  Location: [facility] ENDOSCOPY;  Service: Endoscopy;  Laterality: N/A;   ESOPHAGOGASTRODUODENOSCOPY N/A 05/05/2020   Procedure: ESOPHAGOGASTRODUODENOSCOPY (EGD);  Surgeon: Toledo, Boykin Nearing, MD;  Location: [facility] ENDOSCOPY;  Service: Gastroenterology;  Laterality: N/A;   ESOPHAGOGASTRODUODENOSCOPY (EGD) WITH PROPOFOL N/A  09/28/2016   Procedure: ESOPHAGOGASTRODUODENOSCOPY (EGD) WITH PROPOFOL;  Surgeon: Scot Jun, MD;  Location: [facility] ENDOSCOPY;  Service: Endoscopy;  Laterality: N/A;   ESOPHAGOGASTRODUODENOSCOPY (EGD) WITH PROPOFOL N/A 03/13/2017   Procedure: ESOPHAGOGASTRODUODENOSCOPY (EGD) WITH PROPOFOL;  Surgeon: Wyline Mood, MD;  Location: [facility] ENDOSCOPY;  Service: Endoscopy;  Laterality: N/A;   ESOPHAGOGASTRODUODENOSCOPY (EGD) WITH PROPOFOL N/A 01/07/2024   Procedure: ESOPHAGOGASTRODUODENOSCOPY (EGD) WITH PROPOFOL;  Surgeon: Midge Minium, MD;  Location: [facility] ENDOSCOPY;  Service: Endoscopy;  Laterality: N/A;   GALLBLADDER SURGERY     GASTRIC BYPASS     GIVENS CAPSULE STUDY N/A 03/13/2017   Procedure: GIVENS CAPSULE STUDY;  Surgeon: Wyline Mood, MD;  Location: [facility] ENDOSCOPY;  Service: Endoscopy;  Laterality: N/A;   HERNIA REPAIR     JOINT REPLACEMENT  1995   REPLACEMENT TOTAL KNEE     ROTATOR CUFF REPAIR      Social History   Socioeconomic History   Marital status: Widowed    Spouse name: Not on file   Number of children: Not on file   Years of education: Not on file   Highest education level: Not on file  Occupational History   Occupation: disabled  Tobacco Use   Smoking status: Never   Smokeless tobacco: Never  Vaping Use   Vaping status: Never Used  Substance and Sexual Activity   Alcohol use: No    Alcohol/week: 0.0 standard drinks of alcohol  Drug use: No   Sexual activity: Yes  Other Topics Concern   Not on file  Social History Narrative   Not on file   Social Drivers of Health   Financial Resource Strain: Low Risk  (07/20/2022)   Received from [facility], [facility]   Overall Financial Resource Strain (CARDIA)    Difficulty of Paying Living Expenses: Not hard at all  Food Insecurity: No Food Insecurity (07/20/2022)   Received from [facility], [facility]   Hunger Vital Sign    Worried About Running Out of Food in the Last Year: Never true    Ran Out of  Food in the Last Year: Never true  Transportation Needs: No Transportation Needs (07/20/2022)   Received from [facility], [facility]   [facility] - Transportation    Lack of Transportation (Medical): No    Lack of Transportation (Non-Medical): No  Physical Activity: Not on file  Stress: Not on file  Social Connections: Not on file  Intimate Partner Violence: Not on file    Family History  Problem Relation Age of Onset   Hypertension Father    COPD Father    Heart disease Father    Breast cancer Neg Hx     Allergies  Allergen Reactions   Morphine Hives   Iodinated Contrast Media Hives   Aspirin Hives    Noted on MD progress notes and discussed with MD 09/02/15   Etodolac Hives   Ibuprofen Hives   Shellfish Allergy Hives   Tylenol [Acetaminophen] Hives    Upset stomach     Outpatient Medications Prior to Visit  Medication Sig   albuterol (VENTOLIN HFA) 108 (90 Base) MCG/ACT inhaler Inhale 2 puffs into the lungs every 4 (four) hours as needed for wheezing or shortness of breath.   ALPRAZolam (XANAX) 0.25 MG tablet Take 1 tablet (0.25 mg total) by mouth 3 (three) times daily as needed for anxiety.   apixaban (ELIQUIS) 5 MG TABS tablet Take 1 tablet (5 mg total) by mouth 2 (two) times daily.   atorvastatin (LIPITOR) 40 MG tablet TAKE 1 TABLET(40 MG) BY MOUTH EVERY EVENING   baclofen (LIORESAL) 10 MG tablet TAKE 1 TABLET BY MOUTH 3 TIMES A DAY   benzonatate (TESSALON PERLES) 100 MG capsule Take 1 capsule (100 mg total) by mouth every 6 (six) hours as needed for cough.   busPIRone (BUSPAR) 7.5 MG tablet TAKE 1 TABLET BY MOUTH 2 TIMES A DAY   Calcium Carbonate-Vitamin D (OYSTER SHELL CALCIUM 500 + D PO) Take 1 tablet by mouth daily.   celecoxib (CELEBREX) 400 MG capsule Take 400 mg by mouth every morning.   cloNIDine (CATAPRES) 0.1 MG tablet TAKE 1 TABLET BY MOUTH AT BEDTIME   diclofenac Sodium (VOLTAREN) 1 % GEL APPLY 4 GRAMS TOPICALLY TO NECK TWICE DAILY   empagliflozin  (JARDIANCE) 25 MG TABS tablet Take 1 tablet (25 mg total) by mouth daily.   FEROSUL 325 (65 Fe) MG tablet TAKE 1 TABLET BY MOUTH TWICE DAILY   fexofenadine (ALLERGY RELIEF) 180 MG tablet Take 1 tablet (180 mg total) by mouth daily.   fluticasone (FLONASE) 50 MCG/ACT nasal spray Place 1 spray into both nostrils daily.   folic acid (FOLVITE) 1 MG tablet Take 1 tablet (1 mg total) by mouth daily.   glucose blood (ONETOUCH VERIO) test strip TEST TWICE A DAY   hydrOXYzine (ATARAX) 25 MG tablet TAKE 1 TABLET BY MOUTH 3 TIMES A DAY  meclizine (ANTIVERT) 25 MG tablet TAKE 1 TABLET BY MOUTH THREE TIMES DAILY   metoprolol succinate (TOPROL-XL) 100 MG 24 hr tablet TAKE 1 TABLET BY MOUTH EVERY DAY   mirtazapine (REMERON) 45 MG tablet Take 1 tablet (45 mg total) by mouth at bedtime.   naloxone (NARCAN) nasal spray 4 mg/0.1 mL FOR SUSPECTED OPIOID OVERDOSE, ADMINISTER 1 SPRAY INTO ONE NOSTRIL. REPEAT IN OTHER NOSTRIL USING A NEW DEVICE AFTER 2-3 MINUTES IF NO OR MINIMAL RESPONSE. CALL 911 IF USED   NEXIUM 40 MG capsule Take 1 capsule (40 mg total) by mouth 2 (two) times daily.   OLMESARTAN MEDOXOMIL PO Take by mouth.   oxybutynin (DITROPAN) 5 MG tablet Take 1 tablet (5 mg total) by mouth 2 (two) times daily.   Oxycodone HCl 20 MG TABS Take 1 tablet (20 mg total) by mouth in the morning, at noon, in the evening, and at bedtime. As needed for pain   polyethylene glycol (MIRALAX / GLYCOLAX) 17 g packet Take 17 g by mouth daily as needed for mild constipation.   sucralfate (CARAFATE) 1 GM/10ML suspension Take 10 mLs (1 g total) by mouth 4 (four) times daily -  with meals and at bedtime for 14 days.   Suvorexant (BELSOMRA) 20 MG TABS Take 1 tablet (20 mg total) by mouth at bedtime. TAKE 1 TABLET(20 MG) BY MOUTH AT BEDTIME   tirzepatide (MOUNJARO) 10 MG/0.5ML Pen INJECT 10 MG UNDER THE SKIN ONCE WEEKLY   TRESIBA FLEXTOUCH 200 UNIT/ML FlexTouch Pen Inject 28 Units into the skin every morning.   UBRELVY 100 MG TABS  Take 1 tablet (100 mg total) by mouth as needed.   zolpidem (AMBIEN CR) 6.25 MG CR tablet Take 6.25 mg by mouth at bedtime as needed.   [DISCONTINUED] potassium chloride (KLOR-CON) 10 MEQ tablet Take 1 tablet (10 mEq total) by mouth daily.   No facility-administered medications prior to visit.    Review of Systems  Constitutional:  Positive for chills.  HENT: Negative.    Eyes: Negative.   Respiratory:  Positive for cough.   Cardiovascular: Negative.   Gastrointestinal: Negative.   Genitourinary: Negative.   Skin: Negative.   Neurological: Negative.   Endo/Heme/Allergies: Negative.   Psychiatric/Behavioral:  The patient has insomnia.        Objective:   BP 126/78   Pulse 94   Temp 97.8 F (36.6 C)   Ht 5\' 7"  (1.702 m)   Wt 248 lb (112.5 kg)   SpO2 95%   BMI 38.84 kg/m   Vitals:   01/22/24 1127  BP: 126/78  Pulse: 94  Temp: 97.8 F (36.6 C)  Height: 5\' 7"  (1.702 m)  Weight: 248 lb (112.5 kg)  SpO2: 95%  BMI (Calculated): 38.83    Physical Exam Vitals reviewed.  Constitutional:      General: She is not in acute distress.    Appearance: She is obese.  HENT:     Head: Normocephalic.     Nose: Nose normal.     Mouth/Throat:     Mouth: Mucous membranes are moist.  Eyes:     Extraocular Movements: Extraocular movements intact.     Pupils: Pupils are equal, round, and reactive to light.  Cardiovascular:     Rate and Rhythm: Normal rate and regular rhythm.     Heart sounds: No murmur heard. Pulmonary:     Effort: Pulmonary effort is normal.     Breath sounds: No rhonchi or rales.  Abdominal:  General: Abdomen is flat.     Palpations: There is no hepatomegaly, splenomegaly or mass.  Musculoskeletal:        General: Normal range of motion.     Cervical back: Normal range of motion. No tenderness.  Skin:    General: Skin is warm and dry.  Neurological:     General: No focal deficit present.     Mental Status: She is alert and oriented to person,  place, and time.     Cranial Nerves: No cranial nerve deficit.     Motor: No weakness.  Psychiatric:        Mood and Affect: Mood normal.        Behavior: Behavior normal.      Results for orders placed or performed in visit on 01/22/24  POCT CBG (Fasting - Glucose)  Result Value Ref Range   Glucose Fasting, POC 132 (A) 70 - 99 mg/dL    Recent Results (from the past 2160 hours)  POCT CBG (Fasting - Glucose)     Status: Abnormal   Collection Time: 10/29/23  1:47 PM  Result Value Ref Range   Glucose Fasting, POC 124 (A) 70 - 99 mg/dL  POC CREATINE & ALBUMIN,URINE     Status: None   Collection Time: 10/29/23  2:38 PM  Result Value Ref Range   Microalbumin Ur, POC 150 mg/L   Creatinine, POC 300 mg/dL   Albumin/Creatinine Ratio, Urine, POC 300-300   IGP, Aptima HPV     Status: None   Collection Time: 11/13/23 12:00 AM  Result Value Ref Range   DIAGNOSIS: Comment     Comment: UNSATISFACTORY FOR EVALUATION.   Specimen adequacy: Comment     Comment: Specimen processed and examined, but unsatisfactory for evaluation of epithelial abnormality because of insufficient squamous cellularity.    Clinician Provided ICD10 Comment     Comment: Z12.4   Performed by: Comment     Comment: Frances Maywood, BS, Cytotechnologist (ASCP)   QC reviewed by: Comment     Comment: Elie Confer, Cytotechnologist (ASCP)   PAP Smear Comment .    Note: Comment     Comment: The Pap smear is a screening test designed to aid in the detection of premalignant and malignant conditions of the uterine cervix.  It is not a diagnostic procedure and should not be used as the sole means of detecting cervical cancer.  Both false-positive and false-negative reports do occur.    Test Methodology CANCELED     Comment: The Thin Prep(R) Imager was unable to read this specimen.  Therefore a manual review was performed.  Result canceled by the ancillary.    HPV Aptima Negative Negative    Comment: This nucleic acid  amplification test detects fourteen high-risk HPV types (16,18,31,33,35,39,45,51,52,56,58,59,66,68) without differentiation.   Specimen status report     Status: None   Collection Time: 11/13/23 12:00 AM  Result Value Ref Range   specimen status report Comment     Comment: Please note Please note The date and/or time of collection was not indicated on the requisition as required by state and federal law.  The date of receipt of the specimen was used as the collection date if not supplied.   Basic metabolic panel     Status: Abnormal   Collection Time: 01/07/24  4:58 AM  Result Value Ref Range   Sodium 139 135 - 145 mmol/L   Potassium 4.0 3.5 - 5.1 mmol/L   Chloride 109 98 - 111 mmol/L  CO2 23 22 - 32 mmol/L   Glucose, Bld 108 (H) 70 - 99 mg/dL    Comment: Glucose reference range applies only to samples taken after fasting for at least 8 hours.   BUN 30 (H) 8 - 23 mg/dL   Creatinine, Ser 4.09 (H) 0.44 - 1.00 mg/dL   Calcium 8.3 (L) 8.9 - 10.3 mg/dL   GFR, Estimated 49 (L) >60 mL/min    Comment: (NOTE) Calculated using the CKD-EPI Creatinine Equation (2021)    Anion gap 7 5 - 15    Comment: Performed at [facility], [address] [place], [place] [address]  CBC     Status: Abnormal   Collection Time: 01/07/24  4:58 AM  Result Value Ref Range   WBC 8.2 4.0 - 10.5 K/uL   RBC 5.09 3.87 - 5.11 MIL/uL   Hemoglobin 14.2 12.0 - 15.0 g/dL   HCT 47.8 (H) 29.5 - 62.1 %   MCV 93.1 80.0 - 100.0 fL   MCH 27.9 26.0 - 34.0 pg   MCHC 30.0 30.0 - 36.0 g/dL   RDW 30.8 65.7 - 84.6 %   Platelets 231 150 - 400 K/uL   nRBC 0.0 0.0 - 0.2 %    Comment: Performed at [facility], [address], [place], [place] [address]  Troponin I (High Sensitivity)     Status: None   Collection Time: 01/07/24  4:58 AM  Result Value Ref Range   Troponin I (High Sensitivity) 3 <18 ng/L    Comment: (NOTE) Elevated high sensitivity troponin I (hsTnI) values and significant  changes  across serial measurements may suggest ACS but many other  chronic and acute conditions are known to elevate hsTnI results.  Refer to the "Links" section for chest pain algorithms and additional  guidance. Performed at [facility], [address] [place], [place] [address]   CBG monitoring, ED     Status: Abnormal   Collection Time: 01/07/24  1:03 PM  Result Value Ref Range   Glucose-Capillary 69 (L) 70 - 99 mg/dL    Comment: Glucose reference range applies only to samples taken after fasting for at least 8 hours.  POCT CBG (Fasting - Glucose)     Status: Abnormal   Collection Time: 01/22/24 11:33 AM  Result Value Ref Range   Glucose Fasting, POC 132 (A) 70 - 99 mg/dL      Assessment & Plan:  As per problem list.   Problem List Items Addressed This Visit       Cardiovascular and Mediastinum   HTN (hypertension)   Relevant Medications   potassium chloride (KLOR-CON) 10 MEQ tablet     Endocrine   Type II diabetes mellitus with renal manifestations (HCC) - Primary   Relevant Orders   POCT CBG (Fasting - Glucose) (Completed)    Return if symptoms worsen or fail to improve.   Total time spent: 20 minutes  Luna Fuse, MD  01/22/2024   This document may have been prepared by [facility] Voice Recognition software and as such may include unintentional dictation errors.

## 2024-01-30 ENCOUNTER — Encounter: Payer: Self-pay | Admitting: Gastroenterology

## 2024-01-31 ENCOUNTER — Other Ambulatory Visit: Payer: Self-pay | Admitting: Internal Medicine

## 2024-01-31 DIAGNOSIS — D500 Iron deficiency anemia secondary to blood loss (chronic): Secondary | ICD-10-CM

## 2024-02-02 DIAGNOSIS — R0902 Hypoxemia: Secondary | ICD-10-CM | POA: Diagnosis not present

## 2024-02-02 DIAGNOSIS — J45998 Other asthma: Secondary | ICD-10-CM | POA: Diagnosis not present

## 2024-02-04 ENCOUNTER — Other Ambulatory Visit: Payer: Self-pay | Admitting: Internal Medicine

## 2024-02-04 ENCOUNTER — Other Ambulatory Visit

## 2024-02-04 ENCOUNTER — Other Ambulatory Visit: Payer: Self-pay

## 2024-02-04 DIAGNOSIS — N1831 Chronic kidney disease, stage 3a: Secondary | ICD-10-CM | POA: Diagnosis not present

## 2024-02-04 DIAGNOSIS — G8929 Other chronic pain: Secondary | ICD-10-CM | POA: Diagnosis not present

## 2024-02-04 DIAGNOSIS — Z794 Long term (current) use of insulin: Secondary | ICD-10-CM | POA: Diagnosis not present

## 2024-02-04 DIAGNOSIS — E1122 Type 2 diabetes mellitus with diabetic chronic kidney disease: Secondary | ICD-10-CM | POA: Diagnosis not present

## 2024-02-04 MED ORDER — MIRTAZAPINE 45 MG PO TABS: 45.0000 mg | ORAL_TABLET | Freq: Every day | ORAL | 3 refills | Status: AC

## 2024-02-06 ENCOUNTER — Ambulatory Visit (INDEPENDENT_AMBULATORY_CARE_PROVIDER_SITE_OTHER): Payer: 59 | Admitting: Internal Medicine

## 2024-02-06 ENCOUNTER — Encounter: Payer: Self-pay | Admitting: Internal Medicine

## 2024-02-06 VITALS — BP 148/97 | HR 118 | Temp 98.6°F | Ht 67.0 in | Wt 245.0 lb

## 2024-02-06 DIAGNOSIS — I10 Essential (primary) hypertension: Secondary | ICD-10-CM | POA: Diagnosis not present

## 2024-02-06 DIAGNOSIS — E119 Type 2 diabetes mellitus without complications: Secondary | ICD-10-CM | POA: Diagnosis not present

## 2024-02-06 DIAGNOSIS — E782 Mixed hyperlipidemia: Secondary | ICD-10-CM

## 2024-02-06 DIAGNOSIS — I639 Cerebral infarction, unspecified: Secondary | ICD-10-CM

## 2024-02-06 DIAGNOSIS — G8929 Other chronic pain: Secondary | ICD-10-CM

## 2024-02-06 MED ORDER — OXYCODONE HCL 20 MG PO TABS
20.0000 mg | ORAL_TABLET | Freq: Four times a day (QID) | ORAL | 0 refills | Status: DC
Start: 2024-02-06 — End: 2024-05-07

## 2024-02-06 MED ORDER — OXYCODONE HCL 20 MG PO TABS
20.0000 mg | ORAL_TABLET | Freq: Four times a day (QID) | ORAL | 0 refills | Status: DC
Start: 2024-03-08 — End: 2024-04-07

## 2024-02-06 MED ORDER — ATORVASTATIN CALCIUM 40 MG PO TABS: 40.0000 mg | ORAL_TABLET | Freq: Every evening | ORAL | 0 refills | Status: DC

## 2024-02-06 NOTE — Progress Notes (Signed)
 Established Patient Office Visit  Subjective:  Patient ID: Lindsey [name], female    DOB: 01/14/1961  Age: 63 y.o. MRN: [id number]  Chief Complaint  Patient presents with   Pain Management    PM    Here for pain management follow up. Chronic pain well controlled on current analgesia. Last UDS satisfactory and pill counts have also been satisfactory. Still c/o cough after being diagnosed with pna 2 wks ago and received a 3 d course of atbs. BP elevated today but has been taking otc antitussives with decongestants.    No other concerns at this time.   Past Medical History:  Diagnosis Date   Allergy    Anemia    Arthritis    knees   CHF (congestive heart failure) (HCC)    COPD (chronic obstructive pulmonary disease) (HCC)    Diabetes mellitus without complication (HCC)    diet controlled   GERD (gastroesophageal reflux disease)    Heart attack (HCC) 2002   Hyperlipidemia    Hypertension    PUD (peptic ulcer disease)    Sleep apnea    can't tolerate CPAP.  Uses O2 only   Spinal cord stimulator status    for lower back pain   Stroke [facility]) 2004   "mini-stroke"   Vertigo    Wears dentures    full upper and lower    Past Surgical History:  Procedure Laterality Date   ABDOMINAL HYSTERECTOMY     back surgery     broken wrist     COLONOSCOPY WITH PROPOFOL N/A 01/30/2017   Procedure: COLONOSCOPY WITH PROPOFOL;  Surgeon: Wyline Mood, MD;  Location: [facility] ENDOSCOPY;  Service: Endoscopy;  Laterality: N/A;   COLONOSCOPY WITH PROPOFOL N/A 02/20/2017   Procedure: Colonoscopy with propofol ;  Surgeon: Wyline Mood, MD;  Location: [facility] ENDOSCOPY;  Service: Endoscopy;  Laterality: N/A;   COLONOSCOPY WITH PROPOFOL N/A 03/13/2017   Procedure: COLONOSCOPY WITH PROPOFOL;  Surgeon: Wyline Mood, MD;  Location: [facility] ENDOSCOPY;  Service: Endoscopy;  Laterality: N/A;   COLONOSCOPY WITH PROPOFOL N/A 04/17/2017   Procedure: COLONOSCOPY WITH PROPOFOL;  Surgeon: Wyline Mood, MD;  Location: [facility]  ENDOSCOPY;  Service: Endoscopy;  Laterality: N/A;   ESOPHAGOGASTRODUODENOSCOPY N/A 05/05/2020   Procedure: ESOPHAGOGASTRODUODENOSCOPY (EGD);  Surgeon: Toledo, Boykin Nearing, MD;  Location: [facility] ENDOSCOPY;  Service: Gastroenterology;  Laterality: N/A;   ESOPHAGOGASTRODUODENOSCOPY (EGD) WITH PROPOFOL N/A 09/28/2016   Procedure: ESOPHAGOGASTRODUODENOSCOPY (EGD) WITH PROPOFOL;  Surgeon: Scot Jun, MD;  Location: [facility] ENDOSCOPY;  Service: Endoscopy;  Laterality: N/A;   ESOPHAGOGASTRODUODENOSCOPY (EGD) WITH PROPOFOL N/A 03/13/2017   Procedure: ESOPHAGOGASTRODUODENOSCOPY (EGD) WITH PROPOFOL;  Surgeon: Wyline Mood, MD;  Location: [facility] ENDOSCOPY;  Service: Endoscopy;  Laterality: N/A;   ESOPHAGOGASTRODUODENOSCOPY (EGD) WITH PROPOFOL N/A 01/07/2024   Procedure: ESOPHAGOGASTRODUODENOSCOPY (EGD) WITH PROPOFOL;  Surgeon: Midge Minium, MD;  Location: [facility] ENDOSCOPY;  Service: Endoscopy;  Laterality: N/A;   GALLBLADDER SURGERY     GASTRIC BYPASS     GIVENS CAPSULE STUDY N/A 03/13/2017   Procedure: GIVENS CAPSULE STUDY;  Surgeon: Wyline Mood, MD;  Location: [facility] ENDOSCOPY;  Service: Endoscopy;  Laterality: N/A;   HERNIA REPAIR     JOINT REPLACEMENT  1995   REPLACEMENT TOTAL KNEE     ROTATOR CUFF REPAIR      Social History   Socioeconomic History   Marital status: Widowed    Spouse name: Not on file   Number of children: Not on file   Years of education: Not on file   Highest  education level: Not on file  Occupational History   Occupation: disabled  Tobacco Use   Smoking status: Never   Smokeless tobacco: Never  Vaping Use   Vaping status: Never Used  Substance and Sexual Activity   Alcohol use: No    Alcohol/week: 0.0 standard drinks of alcohol   Drug use: No   Sexual activity: Yes  Other Topics Concern   Not on file  Social History Narrative   Not on file   Social Drivers of Health   Financial Resource Strain: Low Risk  (07/20/2022)   Received from [facility], [facility]    Overall Financial Resource Strain (CARDIA)    Difficulty of Paying Living Expenses: Not hard at all  Food Insecurity: No Food Insecurity (07/20/2022)   Received from [facility], [facility]   Hunger Vital Sign    Worried About Running Out of Food in the Last Year: Never true    Ran Out of Food in the Last Year: Never true  Transportation Needs: No Transportation Needs (07/20/2022)   Received from [facility], [facility]   [facility] - Transportation    Lack of Transportation (Medical): No    Lack of Transportation (Non-Medical): No  Physical Activity: Not on file  Stress: Not on file  Social Connections: Not on file  Intimate Partner Violence: Not on file    Family History  Problem Relation Age of Onset   Hypertension Father    COPD Father    Heart disease Father    Breast cancer Neg Hx     Allergies  Allergen Reactions   Morphine Hives   Iodinated Contrast Media Hives   Aspirin Hives    Noted on MD progress notes and discussed with MD 09/02/15   Etodolac Hives   Ibuprofen Hives   Shellfish Allergy Hives   Tylenol [Acetaminophen] Hives    Upset stomach     Outpatient Medications Prior to Visit  Medication Sig Note   albuterol (VENTOLIN HFA) 108 (90 Base) MCG/ACT inhaler Inhale 2 puffs into the lungs every 4 (four) hours as needed for wheezing or shortness of breath.    ALPRAZolam (XANAX) 0.25 MG tablet Take 1 tablet (0.25 mg total) by mouth 3 (three) times daily as needed for anxiety.    apixaban (ELIQUIS) 5 MG TABS tablet Take 1 tablet (5 mg total) by mouth 2 (two) times daily.    baclofen (LIORESAL) 10 MG tablet TAKE 1 TABLET BY MOUTH 3 TIMES A DAY    benzonatate (TESSALON PERLES) 100 MG capsule Take 1 capsule (100 mg total) by mouth every 6 (six) hours as needed for cough.    Calcium Carbonate-Vitamin D (OYSTER SHELL CALCIUM 500 + D PO) Take 1 tablet by mouth daily.    cloNIDine (CATAPRES) 0.1 MG tablet TAKE 1 TABLET BY MOUTH AT BEDTIME    diclofenac  Sodium (VOLTAREN) 1 % GEL APPLY 4 GRAMS TOPICALLY TO NECK TWICE DAILY    empagliflozin (JARDIANCE) 25 MG TABS tablet Take 1 tablet (25 mg total) by mouth daily.    FEROSUL 325 (65 Fe) MG tablet TAKE 1 TABLET BY MOUTH TWICE DAILY    fexofenadine (ALLERGY RELIEF) 180 MG tablet Take 1 tablet (180 mg total) by mouth daily.    fluticasone (FLONASE) 50 MCG/ACT nasal spray Place 1 spray into both nostrils daily.    folic acid (FOLVITE) 1 MG tablet TAKE 1 TABLET BY MOUTH EVERY DAY    glucose blood (ONETOUCH VERIO)  test strip TEST TWICE A DAY    hydrOXYzine (ATARAX) 25 MG tablet TAKE 1 TABLET BY MOUTH 3 TIMES A DAY    Insulin Pen Needle (B-D UF III MINI PEN NEEDLES) 31G X 5 MM MISC USE WITH DEVICE TO INJECT LEVEMIR NIGHTLY    meclizine (ANTIVERT) 25 MG tablet TAKE 1 TABLET BY MOUTH THREE TIMES DAILY    mirtazapine (REMERON) 45 MG tablet Take 1 tablet (45 mg total) by mouth at bedtime.    naloxone (NARCAN) nasal spray 4 mg/0.1 mL FOR SUSPECTED OPIOID OVERDOSE, ADMINISTER 1 SPRAY INTO ONE NOSTRIL. REPEAT IN OTHER NOSTRIL USING A NEW DEVICE AFTER 2-3 MINUTES IF NO OR MINIMAL RESPONSE. CALL 911 IF USED    NEXIUM 40 MG capsule Take 1 capsule (40 mg total) by mouth 2 (two) times daily.    oxybutynin (DITROPAN) 5 MG tablet Take 1 tablet (5 mg total) by mouth 2 (two) times daily.    polyethylene glycol (MIRALAX / GLYCOLAX) 17 g packet Take 17 g by mouth daily as needed for mild constipation.    potassium chloride (KLOR-CON) 10 MEQ tablet Take 1 tablet (10 mEq total) by mouth daily.    tirzepatide (MOUNJARO) 10 MG/0.5ML Pen INJECT 10 MG UNDER THE SKIN ONCE WEEKLY    TRESIBA FLEXTOUCH 200 UNIT/ML FlexTouch Pen Inject 28 Units into the skin every morning.    UBRELVY 100 MG TABS Take 1 tablet (100 mg total) by mouth as needed.    zolpidem (AMBIEN CR) 6.25 MG CR tablet Take 6.25 mg by mouth at bedtime as needed.    [DISCONTINUED] atorvastatin (LIPITOR) 40 MG tablet TAKE 1 TABLET(40 MG) BY MOUTH EVERY EVENING     [DISCONTINUED] busPIRone (BUSPAR) 7.5 MG tablet TAKE 1 TABLET BY MOUTH 2 TIMES A DAY    [DISCONTINUED] celecoxib (CELEBREX) 400 MG capsule Take 400 mg by mouth every morning.    [DISCONTINUED] metoprolol succinate (TOPROL-XL) 100 MG 24 hr tablet TAKE 1 TABLET BY MOUTH EVERY DAY 02/06/2024: in the hospital   [DISCONTINUED] OLMESARTAN MEDOXOMIL PO Take by mouth. 02/06/2024: in the hospital   [DISCONTINUED] Oxycodone HCl 20 MG TABS Take 1 tablet (20 mg total) by mouth in the morning, at noon, in the evening, and at bedtime. As needed for pain    [DISCONTINUED] sucralfate (CARAFATE) 1 GM/10ML suspension Take 10 mLs (1 g total) by mouth 4 (four) times daily -  with meals and at bedtime for 14 days.    [DISCONTINUED] Suvorexant (BELSOMRA) 20 MG TABS Take 1 tablet (20 mg total) by mouth at bedtime. TAKE 1 TABLET(20 MG) BY MOUTH AT BEDTIME 02/06/2024: By psych   No facility-administered medications prior to visit.    Review of Systems  Constitutional:  Positive for chills.  HENT: Negative.    Eyes: Negative.   Respiratory:  Positive for cough.   Cardiovascular: Negative.   Gastrointestinal: Negative.   Genitourinary: Negative.   Skin: Negative.   Neurological: Negative.   Endo/Heme/Allergies: Negative.   Psychiatric/Behavioral:  The patient has insomnia.        Objective:   BP (!) 148/97   Pulse (!) 118   Temp 98.6 F (37 C)   Ht 5\' 7"  (1.702 m)   Wt 245 lb (111.1 kg)   SpO2 99%   BMI 38.37 kg/m   Vitals:   02/06/24 0854  BP: (!) 148/97  Pulse: (!) 118  Temp: 98.6 F (37 C)  Height: 5\' 7"  (1.702 m)  Weight: 245 lb (111.1 kg)  SpO2: 99%  BMI (Calculated): 38.36    Physical Exam Vitals reviewed.  Constitutional:      General: She is not in acute distress.    Appearance: She is obese.  HENT:     Head: Normocephalic.     Nose: Nose normal.     Mouth/Throat:     Mouth: Mucous membranes are moist.  Eyes:     Extraocular Movements: Extraocular movements intact.      Pupils: Pupils are equal, round, and reactive to light.  Cardiovascular:     Rate and Rhythm: Normal rate and regular rhythm.     Heart sounds: No murmur heard. Pulmonary:     Effort: Pulmonary effort is normal.     Breath sounds: No rhonchi or rales.  Abdominal:     General: Abdomen is flat.     Palpations: There is no hepatomegaly, splenomegaly or mass.  Musculoskeletal:        General: Normal range of motion.     Cervical back: Normal range of motion. No tenderness.  Skin:    General: Skin is warm and dry.  Neurological:     General: No focal deficit present.     Mental Status: She is alert and oriented to person, place, and time.     Cranial Nerves: No cranial nerve deficit.     Motor: No weakness.  Psychiatric:        Mood and Affect: Mood normal.        Behavior: Behavior normal.      No results found for any visits on 02/06/24.      Assessment & Plan:  As per problem list  Problem List Items Addressed This Visit       Cardiovascular and Mediastinum   HTN (hypertension)   Relevant Medications   atorvastatin (LIPITOR) 40 MG tablet   Stroke (HCC)   Relevant Medications   atorvastatin (LIPITOR) 40 MG tablet     Other   Other chronic pain   Relevant Medications   Oxycodone HCl 20 MG TABS   Oxycodone HCl 20 MG TABS (Start on 03/08/2024)   HLD (hyperlipidemia)   Relevant Medications   atorvastatin (LIPITOR) 40 MG tablet   Other Relevant Orders   Lipid panel   CK   Other Visit Diagnoses       Type 2 diabetes mellitus without complication, without long-term current use of insulin (HCC)    -  Primary   Relevant Medications   atorvastatin (LIPITOR) 40 MG tablet   Other Relevant Orders   Hemoglobin A1c       Return in about 6 weeks (around 03/19/2024) for fu with labs prior.   Total time spent: 20 minutes  Luna Fuse, MD  02/06/2024   This document may have been prepared by [facility] Voice Recognition software and as such may include  unintentional dictation errors.

## 2024-02-07 LAB — DRUG SCREEN 13 W/CONF , SERUM

## 2024-02-07 LAB — LIPID PANEL
Chol/HDL Ratio: 5.7 ratio — ABNORMAL HIGH (ref 0.0–4.4)
Cholesterol, Total: 194 mg/dL (ref 100–199)
HDL: 34 mg/dL — ABNORMAL LOW (ref >39)
LDL Chol Calc (NIH): 103 mg/dL — ABNORMAL HIGH (ref 0–99)
Triglycerides: 335 mg/dL — ABNORMAL HIGH (ref 0–149)
VLDL Cholesterol Cal: 57 mg/dL — ABNORMAL HIGH (ref 5–40)

## 2024-02-07 LAB — HEMOGLOBIN A1C
Est. average glucose Bld gHb Est-mCnc: 117 mg/dL
Hgb A1c MFr Bld: 5.7 % — ABNORMAL HIGH (ref 4.8–5.6)

## 2024-02-11 ENCOUNTER — Encounter: Payer: Self-pay | Admitting: Internal Medicine

## 2024-02-11 ENCOUNTER — Other Ambulatory Visit: Payer: Self-pay

## 2024-02-11 MED ORDER — NEXLIZET 180-10 MG PO TABS: 1.0000 | ORAL_TABLET | Freq: Every day | ORAL | 1 refills | Status: DC

## 2024-02-11 NOTE — Progress Notes (Signed)
 Informed via Mychart message

## 2024-02-14 ENCOUNTER — Other Ambulatory Visit: Payer: Self-pay

## 2024-02-14 MED ORDER — NEXIUM 40 MG PO CPDR: 40.0000 mg | DELAYED_RELEASE_CAPSULE | Freq: Two times a day (BID) | ORAL | 3 refills | Status: DC

## 2024-02-14 MED ORDER — NEXIUM 40 MG PO CPDR: 40.0000 mg | DELAYED_RELEASE_CAPSULE | Freq: Two times a day (BID) | ORAL | 3 refills | Status: AC

## 2024-02-15 ENCOUNTER — Ambulatory Visit: Payer: 59 | Admitting: Podiatry

## 2024-02-20 ENCOUNTER — Other Ambulatory Visit

## 2024-02-20 DIAGNOSIS — E119 Type 2 diabetes mellitus without complications: Secondary | ICD-10-CM | POA: Diagnosis not present

## 2024-02-20 DIAGNOSIS — I10 Essential (primary) hypertension: Secondary | ICD-10-CM | POA: Diagnosis not present

## 2024-02-20 DIAGNOSIS — E782 Mixed hyperlipidemia: Secondary | ICD-10-CM | POA: Diagnosis not present

## 2024-02-21 LAB — HEMOGLOBIN A1C
Est. average glucose Bld gHb Est-mCnc: 128 mg/dL
Hgb A1c MFr Bld: 6.1 % — ABNORMAL HIGH (ref 4.8–5.6)

## 2024-02-21 LAB — COMPREHENSIVE METABOLIC PANEL WITH GFR
ALT: 24 IU/L (ref 0–32)
AST: 20 IU/L (ref 0–40)
Albumin: 3.4 g/dL — ABNORMAL LOW (ref 3.9–4.9)
Alkaline Phosphatase: 171 IU/L — ABNORMAL HIGH (ref 44–121)
BUN/Creatinine Ratio: 23 (ref 12–28)
BUN: 28 mg/dL — ABNORMAL HIGH (ref 8–27)
Bilirubin Total: 0.4 mg/dL (ref 0.0–1.2)
CO2: 17 mmol/L — ABNORMAL LOW (ref 20–29)
Calcium: 8.6 mg/dL — ABNORMAL LOW (ref 8.7–10.3)
Chloride: 108 mmol/L — ABNORMAL HIGH (ref 96–106)
Creatinine, Ser: 1.22 mg/dL — ABNORMAL HIGH (ref 0.57–1.00)
Globulin, Total: 2.4 g/dL (ref 1.5–4.5)
Glucose: 82 mg/dL (ref 70–99)
Potassium: 4.7 mmol/L (ref 3.5–5.2)
Sodium: 141 mmol/L (ref 134–144)
Total Protein: 5.8 g/dL — ABNORMAL LOW (ref 6.0–8.5)
eGFR: 49 mL/min/{1.73_m2} — ABNORMAL LOW (ref >59)

## 2024-02-21 LAB — LIPID PANEL
Chol/HDL Ratio: 2.1 ratio (ref 0.0–4.4)
Cholesterol, Total: 103 mg/dL (ref 100–199)
HDL: 50 mg/dL (ref >39)
LDL Chol Calc (NIH): 41 mg/dL (ref 0–99)
Triglycerides: 45 mg/dL (ref 0–149)
VLDL Cholesterol Cal: 12 mg/dL (ref 5–40)

## 2024-03-04 DIAGNOSIS — J45998 Other asthma: Secondary | ICD-10-CM | POA: Diagnosis not present

## 2024-03-04 DIAGNOSIS — R0902 Hypoxemia: Secondary | ICD-10-CM | POA: Diagnosis not present

## 2024-03-12 ENCOUNTER — Encounter: Payer: Self-pay | Admitting: Internal Medicine

## 2024-03-13 NOTE — Progress Notes (Signed)
 Informed via Mychart message

## 2024-03-15 ENCOUNTER — Other Ambulatory Visit: Payer: Self-pay | Admitting: Internal Medicine

## 2024-03-15 DIAGNOSIS — F418 Other specified anxiety disorders: Secondary | ICD-10-CM

## 2024-03-17 ENCOUNTER — Encounter

## 2024-03-22 ENCOUNTER — Other Ambulatory Visit: Payer: Self-pay | Admitting: Internal Medicine

## 2024-03-22 DIAGNOSIS — Z794 Long term (current) use of insulin: Secondary | ICD-10-CM

## 2024-03-22 DIAGNOSIS — E669 Obesity, unspecified: Secondary | ICD-10-CM

## 2024-04-02 ENCOUNTER — Other Ambulatory Visit: Payer: Self-pay | Admitting: Internal Medicine

## 2024-04-02 DIAGNOSIS — E1122 Type 2 diabetes mellitus with diabetic chronic kidney disease: Secondary | ICD-10-CM

## 2024-04-03 DIAGNOSIS — J45998 Other asthma: Secondary | ICD-10-CM | POA: Diagnosis not present

## 2024-04-03 DIAGNOSIS — R0902 Hypoxemia: Secondary | ICD-10-CM | POA: Diagnosis not present

## 2024-04-07 ENCOUNTER — Ambulatory Visit (INDEPENDENT_AMBULATORY_CARE_PROVIDER_SITE_OTHER): Admitting: Internal Medicine

## 2024-04-07 VITALS — BP 132/78 | HR 124 | Temp 97.4°F | Ht 67.0 in | Wt 252.0 lb

## 2024-04-07 DIAGNOSIS — R7303 Prediabetes: Secondary | ICD-10-CM

## 2024-04-07 DIAGNOSIS — G8929 Other chronic pain: Secondary | ICD-10-CM

## 2024-04-07 DIAGNOSIS — J301 Allergic rhinitis due to pollen: Secondary | ICD-10-CM

## 2024-04-07 DIAGNOSIS — Z0130 Encounter for examination of blood pressure without abnormal findings: Secondary | ICD-10-CM

## 2024-04-07 MED ORDER — FEXOFENADINE HCL 180 MG PO TABS: 180.0000 mg | ORAL_TABLET | Freq: Every day | ORAL | 3 refills | Status: DC

## 2024-04-07 MED ORDER — OXYCODONE HCL 20 MG PO TABS: 20.0000 mg | ORAL_TABLET | Freq: Four times a day (QID) | ORAL | 0 refills | Status: DC

## 2024-04-07 MED ORDER — FLUTICASONE PROPIONATE 50 MCG/ACT NA SUSP: 1.0000 | Freq: Every day | NASAL | 2 refills | Status: AC

## 2024-04-07 NOTE — Progress Notes (Signed)
 Established Patient Office Visit  Subjective:  Patient ID: Lindsey [name], female    DOB: 1961-02-16  Age: 63 y.o. MRN: [id number]  Chief Complaint  Patient presents with   Pain Management    Pain Management    Here for pain management follow up. Chronic pain well controlled on current analgesia. Last UDS satisfactory and pill counts were 3 short but said she forgot to remove them from her pill box this morning. Also c/o flare of seasonal allergies.    No other concerns at this time.   Past Medical History:  Diagnosis Date   Allergy    Anemia    Arthritis    knees   CHF (congestive heart failure) (HCC)    COPD (chronic obstructive pulmonary disease) (HCC)    Diabetes mellitus without complication (HCC)    diet controlled   GERD (gastroesophageal reflux disease)    Heart attack (HCC) 2002   Hyperlipidemia    Hypertension    PUD (peptic ulcer disease)    Sleep apnea    can't tolerate CPAP.  Uses O2 only   Spinal cord stimulator status    for lower back pain   Stroke [facility]) 2004   "mini-stroke"   Vertigo    Wears dentures    full upper and lower    Past Surgical History:  Procedure Laterality Date   ABDOMINAL HYSTERECTOMY     back surgery     broken wrist     COLONOSCOPY WITH PROPOFOL  N/A 01/30/2017   Procedure: COLONOSCOPY WITH PROPOFOL ;  Surgeon: Luke Salaam, MD;  Location: [facility] ENDOSCOPY;  Service: Endoscopy;  Laterality: N/A;   COLONOSCOPY WITH PROPOFOL  N/A 02/20/2017   Procedure: Colonoscopy with propofol  ;  Surgeon: Luke Salaam, MD;  Location: [facility] ENDOSCOPY;  Service: Endoscopy;  Laterality: N/A;   COLONOSCOPY WITH PROPOFOL  N/A 03/13/2017   Procedure: COLONOSCOPY WITH PROPOFOL ;  Surgeon: Luke Salaam, MD;  Location: [facility] ENDOSCOPY;  Service: Endoscopy;  Laterality: N/A;   COLONOSCOPY WITH PROPOFOL  N/A 04/17/2017   Procedure: COLONOSCOPY WITH PROPOFOL ;  Surgeon: Luke Salaam, MD;  Location: [facility] ENDOSCOPY;  Service: Endoscopy;  Laterality: N/A;    ESOPHAGOGASTRODUODENOSCOPY N/A 05/05/2020   Procedure: ESOPHAGOGASTRODUODENOSCOPY (EGD);  Surgeon: Toledo, Alphonsus Jeans, MD;  Location: [facility] ENDOSCOPY;  Service: Gastroenterology;  Laterality: N/A;   ESOPHAGOGASTRODUODENOSCOPY (EGD) WITH PROPOFOL  N/A 09/28/2016   Procedure: ESOPHAGOGASTRODUODENOSCOPY (EGD) WITH PROPOFOL ;  Surgeon: Cassie Click, MD;  Location: [facility] ENDOSCOPY;  Service: Endoscopy;  Laterality: N/A;   ESOPHAGOGASTRODUODENOSCOPY (EGD) WITH PROPOFOL  N/A 03/13/2017   Procedure: ESOPHAGOGASTRODUODENOSCOPY (EGD) WITH PROPOFOL ;  Surgeon: Luke Salaam, MD;  Location: [facility] ENDOSCOPY;  Service: Endoscopy;  Laterality: N/A;   ESOPHAGOGASTRODUODENOSCOPY (EGD) WITH PROPOFOL  N/A 01/07/2024   Procedure: ESOPHAGOGASTRODUODENOSCOPY (EGD) WITH PROPOFOL ;  Surgeon: Marnee Sink, MD;  Location: [facility] ENDOSCOPY;  Service: Endoscopy;  Laterality: N/A;   GALLBLADDER SURGERY     GASTRIC BYPASS     GIVENS CAPSULE STUDY N/A 03/13/2017   Procedure: GIVENS CAPSULE STUDY;  Surgeon: Luke Salaam, MD;  Location: [facility] ENDOSCOPY;  Service: Endoscopy;  Laterality: N/A;   HERNIA REPAIR     JOINT REPLACEMENT  1995   REPLACEMENT TOTAL KNEE     ROTATOR CUFF REPAIR      Social History   Socioeconomic History   Marital status: Widowed    Spouse name: Not on file   Number of children: Not on file   Years of education: Not on file   Highest education level: Not on file  Occupational History   Occupation:  disabled  Tobacco Use   Smoking status: Never   Smokeless tobacco: Never  Vaping Use   Vaping status: Never Used  Substance and Sexual Activity   Alcohol use: No    Alcohol/week: 0.0 standard drinks of alcohol   Drug use: No   Sexual activity: Yes  Other Topics Concern   Not on file  Social History Narrative   Not on file   Social Drivers of Health   Financial Resource Strain: Low Risk  (07/20/2022)   Received from [facility], [facility]   Overall Financial Resource Strain (CARDIA)    Difficulty  of Paying Living Expenses: Not hard at all  Food Insecurity: No Food Insecurity (07/20/2022)   Received from [facility], [facility]   Hunger Vital Sign    Worried About Running Out of Food in the Last Year: Never true    Ran Out of Food in the Last Year: Never true  Transportation Needs: No Transportation Needs (07/20/2022)   Received from [facility], [facility]   [facility] - Transportation    Lack of Transportation (Medical): No    Lack of Transportation (Non-Medical): No  Physical Activity: Not on file  Stress: Not on file  Social Connections: Not on file  Intimate Partner Violence: Not on file    Family History  Problem Relation Age of Onset   Hypertension Father    COPD Father    Heart disease Father    Breast cancer Neg Hx     Allergies  Allergen Reactions   Morphine  Hives   Iodinated Contrast Media Hives   Aspirin Hives    Noted on MD progress notes and discussed with MD 09/02/15   Etodolac Hives   Ibuprofen Hives   Shellfish Allergy Hives   Tylenol  [Acetaminophen ] Hives    Upset stomach     Outpatient Medications Prior to Visit  Medication Sig   albuterol  (VENTOLIN  HFA) 108 (90 Base) MCG/ACT inhaler Inhale 2 puffs into the lungs every 4 (four) hours as needed for wheezing or shortness of breath.   ALPRAZolam  (XANAX ) 0.25 MG tablet TAKE 1 TABLET BY MOUTH 3 TIMES A DAY AS NEEDED FOR ANXIETY   apixaban  (ELIQUIS ) 5 MG TABS tablet Take 1 tablet (5 mg total) by mouth 2 (two) times daily.   atorvastatin  (LIPITOR) 40 MG tablet Take 1 tablet (40 mg total) by mouth at bedtime.   baclofen  (LIORESAL ) 10 MG tablet TAKE 1 TABLET BY MOUTH 3 TIMES A DAY   Bempedoic Acid-Ezetimibe (NEXLIZET ) 180-10 MG TABS Take 1 tablet by mouth daily.   Calcium  Carbonate-Vitamin D  (OYSTER SHELL CALCIUM  500 + D PO) Take 1 tablet by mouth daily.   cloNIDine  (CATAPRES ) 0.1 MG tablet TAKE 1 TABLET BY MOUTH AT BEDTIME   diclofenac  Sodium (VOLTAREN ) 1 % GEL APPLY 4 GRAMS TOPICALLY TO  NECK TWICE DAILY   FEROSUL 325 (65 Fe) MG tablet TAKE 1 TABLET BY MOUTH TWICE DAILY   folic acid  (FOLVITE ) 1 MG tablet TAKE 1 TABLET BY MOUTH EVERY DAY   glucose blood (ONETOUCH VERIO) test strip TEST TWICE A DAY   hydrOXYzine  (ATARAX ) 25 MG tablet TAKE 1 TABLET BY MOUTH 3 TIMES A DAY   Insulin  Pen Needle (B-D UF III MINI PEN NEEDLES) 31G X 5 MM MISC USE WITH DEVICE TO INJECT LEVEMIR  NIGHTLY   JARDIANCE  25 MG TABS tablet TAKE 1 TABLET BY MOUTH DAILY   meclizine  (ANTIVERT ) 25 MG tablet TAKE 1 TABLET BY MOUTH  THREE TIMES DAILY   mirtazapine  (REMERON ) 45 MG tablet Take 1 tablet (45 mg total) by mouth at bedtime.   MOUNJARO  10 MG/0.5ML Pen INJECT 10 MG UNDER THE SKIN ONCE WEEKLY   naloxone  (NARCAN ) nasal spray 4 mg/0.1 mL FOR SUSPECTED OPIOID OVERDOSE, ADMINISTER 1 SPRAY INTO ONE NOSTRIL. REPEAT IN OTHER NOSTRIL USING A NEW DEVICE AFTER 2-3 MINUTES IF NO OR MINIMAL RESPONSE. CALL 911 IF USED   NEXIUM  40 MG capsule Take 1 capsule (40 mg total) by mouth 2 (two) times daily.   oxybutynin  (DITROPAN ) 5 MG tablet Take 1 tablet (5 mg total) by mouth 2 (two) times daily.   Oxycodone  HCl 20 MG TABS Take 1 tablet (20 mg total) by mouth in the morning, at noon, in the evening, and at bedtime. As needed for pain   polyethylene glycol (MIRALAX  / GLYCOLAX ) 17 g packet Take 17 g by mouth daily as needed for mild constipation.   potassium chloride  (KLOR-CON ) 10 MEQ tablet Take 1 tablet (10 mEq total) by mouth daily.   TRESIBA  FLEXTOUCH 200 UNIT/ML FlexTouch Pen Inject 28 Units into the skin every morning.   UBRELVY  100 MG TABS Take 1 tablet (100 mg total) by mouth as needed.   zolpidem  (AMBIEN  CR) 6.25 MG CR tablet Take 6.25 mg by mouth at bedtime as needed.   [DISCONTINUED] benzonatate  (TESSALON  PERLES) 100 MG capsule Take 1 capsule (100 mg total) by mouth every 6 (six) hours as needed for cough.   [DISCONTINUED] fexofenadine  (ALLERGY RELIEF) 180 MG tablet Take 1 tablet (180 mg total) by mouth daily.    [DISCONTINUED] fluticasone  (FLONASE ) 50 MCG/ACT nasal spray Place 1 spray into both nostrils daily.   [DISCONTINUED] Oxycodone  HCl 20 MG TABS Take 1 tablet (20 mg total) by mouth in the morning, at noon, in the evening, and at bedtime. As needed for pain   No facility-administered medications prior to visit.    Review of Systems  HENT:  Positive for congestion.   Eyes: Negative.   Respiratory:  Positive for cough.   Cardiovascular: Negative.   Gastrointestinal: Negative.   Genitourinary: Negative.   Skin: Negative.   Neurological: Negative.   Endo/Heme/Allergies: Negative.   Psychiatric/Behavioral:  The patient has insomnia.        Objective:   BP 132/78   Pulse (!) 124   Temp (!) 97.4 F (36.3 C) (Tympanic)   Ht 5\' 7"  (1.702 m)   Wt 252 lb (114.3 kg)   SpO2 98%   BMI 39.47 kg/m   Vitals:   04/07/24 0948  BP: 132/78  Pulse: (!) 124  Temp: (!) 97.4 F (36.3 C)  Height: 5\' 7"  (1.702 m)  Weight: 252 lb (114.3 kg)  SpO2: 98%  TempSrc: Tympanic  BMI (Calculated): 39.46    Physical Exam   No results found for any visits on 04/07/24.  Recent Results (from the past 2160 hours)  POCT CBG (Fasting - Glucose)     Status: Abnormal   Collection Time: 01/22/24 11:33 AM  Result Value Ref Range   Glucose Fasting, POC 132 (A) 70 - 99 mg/dL  Drug Screen 13 with reflex Confirmation (AMP,BAR,BZO,COC,PCP,THC,OPI,OXY,MD,FEN,MEP,PPX,TRAM), Serum     Status: None   Collection Time: 02/04/24 10:46 AM  Result Value Ref Range   Amphetamines, IA CANCELED ng/mL    Comment: Test not performed SPECIMEN CONTAINER LEAKED IN TRANSIT.  INSUFFICIENT SPECIMEN VOLUME TO PERFORM ANALYSIS.  Result canceled by the ancillary.    Barbiturates, IA CANCELED ug/mL  Comment: Test not performed SPECIMEN CONTAINER LEAKED IN TRANSIT.  INSUFFICIENT SPECIMEN VOLUME TO PERFORM ANALYSIS.  Result canceled by the ancillary.    Benzodiazepines, IA CANCELED ng/mL    Comment: Test not  performed SPECIMEN CONTAINER LEAKED IN TRANSIT.  INSUFFICIENT SPECIMEN VOLUME TO PERFORM ANALYSIS.  Result canceled by the ancillary.    Cocaine & Metabolite, IA CANCELED ng/mL    Comment: Test not performed SPECIMEN CONTAINER LEAKED IN TRANSIT.  INSUFFICIENT SPECIMEN VOLUME TO PERFORM ANALYSIS.  Result canceled by the ancillary.    Phencyclidine, IA CANCELED ng/mL    Comment: Test not performed SPECIMEN CONTAINER LEAKED IN TRANSIT.  INSUFFICIENT SPECIMEN VOLUME TO PERFORM ANALYSIS.  Result canceled by the ancillary.    THC(Marijuana) Metabolite, IA CANCELED ng/mL    Comment: Test not performed SPECIMEN CONTAINER LEAKED IN TRANSIT.  INSUFFICIENT SPECIMEN VOLUME TO PERFORM ANALYSIS.  Result canceled by the ancillary.    Opiates, IA CANCELED ng/mL    Comment: Test not performed SPECIMEN CONTAINER LEAKED IN TRANSIT.  INSUFFICIENT SPECIMEN VOLUME TO PERFORM ANALYSIS.  Result canceled by the ancillary.    Oxycodones, IA CANCELED ng/mL    Comment: Test not performed SPECIMEN CONTAINER LEAKED IN TRANSIT.  INSUFFICIENT SPECIMEN VOLUME TO PERFORM ANALYSIS.  Result canceled by the ancillary.    Methadone, IA CANCELED ng/mL    Comment: Test not performed SPECIMEN CONTAINER LEAKED IN TRANSIT.  INSUFFICIENT SPECIMEN VOLUME TO PERFORM ANALYSIS.  Result canceled by the ancillary.    FENTANYL , IA CANCELED ng/mL    Comment: Test not performed SPECIMEN CONTAINER LEAKED IN TRANSIT.  INSUFFICIENT SPECIMEN VOLUME TO PERFORM ANALYSIS.  Result canceled by the ancillary.    Propoxyphene, IA CANCELED ng/mL    Comment: Test not performed SPECIMEN CONTAINER LEAKED IN TRANSIT.  INSUFFICIENT SPECIMEN VOLUME TO PERFORM ANALYSIS.  Result canceled by the ancillary.    MEPERIDINE, IA CANCELED ng/mL    Comment: Test not performed SPECIMEN CONTAINER LEAKED IN TRANSIT.  INSUFFICIENT SPECIMEN VOLUME TO PERFORM ANALYSIS.  Result canceled by the ancillary.    TRAMADOL , IA CANCELED ng/mL     Comment: Test not performed SPECIMEN CONTAINER LEAKED IN TRANSIT.  INSUFFICIENT SPECIMEN VOLUME TO PERFORM ANALYSIS.  Result canceled by the ancillary.   Lipid panel     Status: Abnormal   Collection Time: 02/04/24 10:46 AM  Result Value Ref Range   Cholesterol, Total 194 100 - 199 mg/dL   Triglycerides 295 (H) 0 - 149 mg/dL   HDL 34 (L) >62 mg/dL   VLDL Cholesterol Cal 57 (H) 5 - 40 mg/dL   LDL Chol Calc (NIH) 130 (H) 0 - 99 mg/dL   Chol/HDL Ratio 5.7 (H) 0.0 - 4.4 ratio    Comment:                                   T. Chol/HDL Ratio                                             Men  Women                               1/2 Avg.Risk  3.4    3.3  Avg.Risk  5.0    4.4                                2X Avg.Risk  9.6    7.1                                3X Avg.Risk 23.4   11.0   Hemoglobin A1c     Status: Abnormal   Collection Time: 02/04/24 10:46 AM  Result Value Ref Range   Hgb A1c MFr Bld 5.7 (H) 4.8 - 5.6 %    Comment:          Prediabetes: 5.7 - 6.4          Diabetes: >6.4          Glycemic control for adults with diabetes: <7.0    Est. average glucose Bld gHb Est-mCnc 117 mg/dL  Comprehensive metabolic panel     Status: Abnormal   Collection Time: 02/20/24  1:33 PM  Result Value Ref Range   Glucose 82 70 - 99 mg/dL   BUN 28 (H) 8 - 27 mg/dL   Creatinine, Ser 4.78 (H) 0.57 - 1.00 mg/dL   eGFR 49 (L) >29 FA/OZH/0.86   BUN/Creatinine Ratio 23 12 - 28   Sodium 141 134 - 144 mmol/L   Potassium 4.7 3.5 - 5.2 mmol/L   Chloride 108 (H) 96 - 106 mmol/L   CO2 17 (L) 20 - 29 mmol/L   Calcium  8.6 (L) 8.7 - 10.3 mg/dL   Total Protein 5.8 (L) 6.0 - 8.5 g/dL   Albumin  3.4 (L) 3.9 - 4.9 g/dL   Globulin, Total 2.4 1.5 - 4.5 g/dL   Bilirubin Total 0.4 0.0 - 1.2 mg/dL   Alkaline Phosphatase 171 (H) 44 - 121 IU/L   AST 20 0 - 40 IU/L   ALT 24 0 - 32 IU/L  Hemoglobin A1c     Status: Abnormal   Collection Time: 02/20/24  1:33 PM  Result Value Ref  Range   Hgb A1c MFr Bld 6.1 (H) 4.8 - 5.6 %    Comment:          Prediabetes: 5.7 - 6.4          Diabetes: >6.4          Glycemic control for adults with diabetes: <7.0    Est. average glucose Bld gHb Est-mCnc 128 mg/dL  Lipid panel     Status: None   Collection Time: 02/20/24  1:33 PM  Result Value Ref Range   Cholesterol, Total 103 100 - 199 mg/dL   Triglycerides 45 0 - 149 mg/dL   HDL 50 >57 mg/dL   VLDL Cholesterol Cal 12 5 - 40 mg/dL   LDL Chol Calc (NIH) 41 0 - 99 mg/dL   Chol/HDL Ratio 2.1 0.0 - 4.4 ratio    Comment:                                   T. Chol/HDL Ratio                                             Men  Women  1/2 Avg.Risk  3.4    3.3                                   Avg.Risk  5.0    4.4                                2X Avg.Risk  9.6    7.1                                3X Avg.Risk 23.4   11.0       Assessment & Plan:   Problem List Items Addressed This Visit       Other   Other chronic pain - Primary   Relevant Medications   Oxycodone  HCl 20 MG TABS   Other Relevant Orders   Drug Screen 13 with reflex Confirmation (AMP,BAR,BZO,COC,PCP,THC,OPI,OXY,MD,FEN,MEP,PPX,TRAM), Serum   Other Visit Diagnoses       Seasonal allergic rhinitis due to pollen       Relevant Medications   fluticasone  (FLONASE ) 50 MCG/ACT nasal spray   fexofenadine  (ALLERGY RELIEF) 180 MG tablet       Return in about 1 month (around 05/08/2024).   Total time spent: 20 minutes  Arzella Bitters, MD  04/07/2024   This document may have been prepared by [facility] Voice Recognition software and as such may include unintentional dictation errors.

## 2024-04-10 ENCOUNTER — Telehealth: Payer: Self-pay

## 2024-04-10 NOTE — Telephone Encounter (Signed)
 Patient LM asking for call back, did not state what she needed

## 2024-04-15 LAB — OPIATES,MS,WB/SP RFX
6-Acetylmorphine: NEGATIVE
Codeine: NEGATIVE ng/mL
Dihydrocodeine: NEGATIVE ng/mL
Hydrocodone: 2.1 ng/mL
Hydromorphone: NEGATIVE ng/mL
Morphine: NEGATIVE ng/mL
Opiate Confirmation: POSITIVE

## 2024-04-15 LAB — AMPHETAMINES/MDMA,MS,WB/SP RFX

## 2024-04-15 LAB — DRUG SCREEN 13 W/CONF , SERUM
Barbiturates, IA: NEGATIVE ug/mL
Cocaine & Metabolite, IA: NEGATIVE ng/mL
FENTANYL, IA: NEGATIVE ng/mL
MEPERIDINE, IA: NEGATIVE ng/mL
Methadone, IA: NEGATIVE ng/mL
Opiates, IA: POSITIVE ng/mL — AB
Oxycodones, IA: NEGATIVE ng/mL
Phencyclidine, IA: NEGATIVE ng/mL
Propoxyphene, IA: NEGATIVE ng/mL
THC(Marijuana) Metabolite, IA: NEGATIVE ng/mL
TRAMADOL, IA: NEGATIVE ng/mL

## 2024-04-15 LAB — BENZODIAZEPINES,MS,WB/SP RFX

## 2024-04-28 ENCOUNTER — Other Ambulatory Visit: Payer: Self-pay | Admitting: Internal Medicine

## 2024-05-01 NOTE — Telephone Encounter (Signed)
 A user error has taken place: encounter opened in error, closed for administrative reasons.

## 2024-05-04 DIAGNOSIS — J45998 Other asthma: Secondary | ICD-10-CM | POA: Diagnosis not present

## 2024-05-04 DIAGNOSIS — R0902 Hypoxemia: Secondary | ICD-10-CM | POA: Diagnosis not present

## 2024-05-07 ENCOUNTER — Ambulatory Visit: Payer: Self-pay | Admitting: Internal Medicine

## 2024-05-07 ENCOUNTER — Ambulatory Visit (INDEPENDENT_AMBULATORY_CARE_PROVIDER_SITE_OTHER): Admitting: Internal Medicine

## 2024-05-07 VITALS — BP 134/88 | HR 126 | Temp 98.1°F | Ht 67.0 in | Wt 248.0 lb

## 2024-05-07 DIAGNOSIS — F418 Other specified anxiety disorders: Secondary | ICD-10-CM

## 2024-05-07 DIAGNOSIS — Z86718 Personal history of other venous thrombosis and embolism: Secondary | ICD-10-CM | POA: Diagnosis not present

## 2024-05-07 DIAGNOSIS — I10 Essential (primary) hypertension: Secondary | ICD-10-CM | POA: Diagnosis not present

## 2024-05-07 DIAGNOSIS — G8929 Other chronic pain: Secondary | ICD-10-CM | POA: Diagnosis not present

## 2024-05-07 DIAGNOSIS — I639 Cerebral infarction, unspecified: Secondary | ICD-10-CM

## 2024-05-07 DIAGNOSIS — N951 Menopausal and female climacteric states: Secondary | ICD-10-CM | POA: Diagnosis not present

## 2024-05-07 MED ORDER — ALPRAZOLAM 0.25 MG PO TABS
0.2500 mg | ORAL_TABLET | Freq: Three times a day (TID) | ORAL | 1 refills | Status: DC | PRN
Start: 2024-05-07 — End: 2024-07-07

## 2024-05-07 MED ORDER — OXYCODONE HCL 20 MG PO TABS: 20.0000 mg | ORAL_TABLET | Freq: Four times a day (QID) | ORAL | 0 refills | Status: DC

## 2024-05-07 MED ORDER — HYDROXYZINE HCL 25 MG PO TABS: 25.0000 mg | ORAL_TABLET | Freq: Three times a day (TID) | ORAL | 3 refills | Status: AC

## 2024-05-07 MED ORDER — ATORVASTATIN CALCIUM 40 MG PO TABS: 40.0000 mg | ORAL_TABLET | Freq: Every evening | ORAL | 0 refills | Status: DC

## 2024-05-07 MED ORDER — APIXABAN 5 MG PO TABS: 5.0000 mg | ORAL_TABLET | Freq: Two times a day (BID) | ORAL | 3 refills | Status: AC

## 2024-05-07 MED ORDER — CLONIDINE HCL 0.1 MG PO TABS
ORAL_TABLET | ORAL | 3 refills | Status: AC
Start: 2024-05-07 — End: ?

## 2024-05-07 MED ORDER — POTASSIUM CHLORIDE ER 10 MEQ PO TBCR: 10.0000 meq | EXTENDED_RELEASE_TABLET | Freq: Every day | ORAL | 1 refills | Status: DC

## 2024-05-07 NOTE — Progress Notes (Signed)
 Established Patient Office Visit  Subjective:  Patient ID: Lindsey [name], female    DOB: 06-Oct-1961  Age: 63 y.o. MRN: [id number]  Chief Complaint  Patient presents with   Pain Management    Pain management    Here for pain management follow up. Chronic pain well controlled on current analgesia. Last DS satisfactory and pill counts have also been satisfactory.     No other concerns at this time.   Past Medical History:  Diagnosis Date   Allergy    Anemia    Arthritis    knees   CHF (congestive heart failure) (HCC)    COPD (chronic obstructive pulmonary disease) (HCC)    Diabetes mellitus without complication (HCC)    diet controlled   GERD (gastroesophageal reflux disease)    Heart attack (HCC) 2002   Hyperlipidemia    Hypertension    PUD (peptic ulcer disease)    Sleep apnea    can't tolerate CPAP.  Uses O2 only   Spinal cord stimulator status    for lower back pain   Stroke [facility]) 2004   mini-stroke   Vertigo    Wears dentures    full upper and lower    Past Surgical History:  Procedure Laterality Date   ABDOMINAL HYSTERECTOMY     back surgery     broken wrist     COLONOSCOPY WITH PROPOFOL  N/A 01/30/2017   Procedure: COLONOSCOPY WITH PROPOFOL ;  Surgeon: Luke Salaam, MD;  Location: [facility] ENDOSCOPY;  Service: Endoscopy;  Laterality: N/A;   COLONOSCOPY WITH PROPOFOL  N/A 02/20/2017   Procedure: Colonoscopy with propofol  ;  Surgeon: Luke Salaam, MD;  Location: [facility] ENDOSCOPY;  Service: Endoscopy;  Laterality: N/A;   COLONOSCOPY WITH PROPOFOL  N/A 03/13/2017   Procedure: COLONOSCOPY WITH PROPOFOL ;  Surgeon: Luke Salaam, MD;  Location: [facility] ENDOSCOPY;  Service: Endoscopy;  Laterality: N/A;   COLONOSCOPY WITH PROPOFOL  N/A 04/17/2017   Procedure: COLONOSCOPY WITH PROPOFOL ;  Surgeon: Luke Salaam, MD;  Location: [facility] ENDOSCOPY;  Service: Endoscopy;  Laterality: N/A;   ESOPHAGOGASTRODUODENOSCOPY N/A 05/05/2020   Procedure: ESOPHAGOGASTRODUODENOSCOPY (EGD);  Surgeon: Toledo,  Alphonsus Jeans, MD;  Location: [facility] ENDOSCOPY;  Service: Gastroenterology;  Laterality: N/A;   ESOPHAGOGASTRODUODENOSCOPY (EGD) WITH PROPOFOL  N/A 09/28/2016   Procedure: ESOPHAGOGASTRODUODENOSCOPY (EGD) WITH PROPOFOL ;  Surgeon: Cassie Click, MD;  Location: [facility] ENDOSCOPY;  Service: Endoscopy;  Laterality: N/A;   ESOPHAGOGASTRODUODENOSCOPY (EGD) WITH PROPOFOL  N/A 03/13/2017   Procedure: ESOPHAGOGASTRODUODENOSCOPY (EGD) WITH PROPOFOL ;  Surgeon: Luke Salaam, MD;  Location: [facility] ENDOSCOPY;  Service: Endoscopy;  Laterality: N/A;   ESOPHAGOGASTRODUODENOSCOPY (EGD) WITH PROPOFOL  N/A 01/07/2024   Procedure: ESOPHAGOGASTRODUODENOSCOPY (EGD) WITH PROPOFOL ;  Surgeon: Marnee Sink, MD;  Location: [facility] ENDOSCOPY;  Service: Endoscopy;  Laterality: N/A;   GALLBLADDER SURGERY     GASTRIC BYPASS     GIVENS CAPSULE STUDY N/A 03/13/2017   Procedure: GIVENS CAPSULE STUDY;  Surgeon: Luke Salaam, MD;  Location: [facility] ENDOSCOPY;  Service: Endoscopy;  Laterality: N/A;   HERNIA REPAIR     JOINT REPLACEMENT  1995   REPLACEMENT TOTAL KNEE     ROTATOR CUFF REPAIR      Social History   Socioeconomic History   Marital status: Widowed    Spouse name: Not on file   Number of children: Not on file   Years of education: Not on file   Highest education level: Not on file  Occupational History   Occupation: disabled  Tobacco Use   Smoking status: Never   Smokeless tobacco: Never  Vaping Use  Vaping status: Never Used  Substance and Sexual Activity   Alcohol use: No    Alcohol/week: 0.0 standard drinks of alcohol   Drug use: No   Sexual activity: Yes  Other Topics Concern   Not on file  Social History Narrative   Not on file   Social Drivers of Health   Financial Resource Strain: Low Risk  (07/20/2022)   Received from [facility], [facility]   Overall Financial Resource Strain (CARDIA)    Difficulty of Paying Living Expenses: Not hard at all  Food Insecurity: No Food Insecurity (07/20/2022)   Received  from [facility], [facility]   Hunger Vital Sign    Worried About Running Out of Food in the Last Year: Never true    Ran Out of Food in the Last Year: Never true  Transportation Needs: No Transportation Needs (07/20/2022)   Received from [facility], [facility]   [facility] - Transportation    Lack of Transportation (Medical): No    Lack of Transportation (Non-Medical): No  Physical Activity: Not on file  Stress: Not on file  Social Connections: Not on file  Intimate Partner Violence: Not on file    Family History  Problem Relation Age of Onset   Hypertension Father    COPD Father    Heart disease Father    Breast cancer Neg Hx     Allergies  Allergen Reactions   Morphine  Hives   Iodinated Contrast Media Hives   Aspirin Hives    Noted on MD progress notes and discussed with MD 09/02/15   Etodolac Hives   Ibuprofen Hives   Shellfish Allergy Hives   Tylenol  [Acetaminophen ] Hives    Upset stomach     Outpatient Medications Prior to Visit  Medication Sig   albuterol  (VENTOLIN  HFA) 108 (90 Base) MCG/ACT inhaler Inhale 2 puffs into the lungs every 4 (four) hours as needed for wheezing or shortness of breath.   baclofen  (LIORESAL ) 10 MG tablet TAKE 1 TABLET BY MOUTH 3 TIMES A DAY   Calcium  Carbonate-Vitamin D  (OYSTER SHELL CALCIUM  500 + D PO) Take 1 tablet by mouth daily.   diclofenac  Sodium (VOLTAREN ) 1 % GEL APPLY 4 GRAMS TOPICALLY TO NECK TWICE DAILY   FEROSUL 325 (65 Fe) MG tablet TAKE 1 TABLET BY MOUTH TWICE DAILY   fexofenadine  (ALLERGY RELIEF) 180 MG tablet Take 1 tablet (180 mg total) by mouth daily.   fluticasone  (FLONASE ) 50 MCG/ACT nasal spray Place 1 spray into both nostrils daily.   folic acid  (FOLVITE ) 1 MG tablet TAKE 1 TABLET BY MOUTH EVERY DAY   glucose blood (ONETOUCH VERIO) test strip TEST TWICE A DAY   Insulin  Pen Needle (B-D UF III MINI PEN NEEDLES) 31G X 5 MM MISC USE WITH DEVICE TO INJECT LEVEMIR  NIGHTLY   JARDIANCE  25 MG TABS tablet TAKE  1 TABLET BY MOUTH DAILY   meclizine  (ANTIVERT ) 25 MG tablet TAKE 1 TABLET BY MOUTH 3 TIMES A DAY   mirtazapine  (REMERON ) 45 MG tablet Take 1 tablet (45 mg total) by mouth at bedtime.   MOUNJARO  10 MG/0.5ML Pen INJECT 10 MG UNDER THE SKIN ONCE WEEKLY   naloxone  (NARCAN ) nasal spray 4 mg/0.1 mL FOR SUSPECTED OPIOID OVERDOSE, ADMINISTER 1 SPRAY INTO ONE NOSTRIL. REPEAT IN OTHER NOSTRIL USING A NEW DEVICE AFTER 2-3 MINUTES IF NO OR MINIMAL RESPONSE. CALL 911 IF USED   NEXIUM  40 MG capsule Take 1 capsule (40 mg total) by mouth  2 (two) times daily.   oxybutynin  (DITROPAN ) 5 MG tablet Take 1 tablet (5 mg total) by mouth 2 (two) times daily.   Oxycodone  HCl 20 MG TABS Take 1 tablet (20 mg total) by mouth in the morning, at noon, in the evening, and at bedtime. As needed for pain   polyethylene glycol (MIRALAX  / GLYCOLAX ) 17 g packet Take 17 g by mouth daily as needed for mild constipation.   TRESIBA  FLEXTOUCH 200 UNIT/ML FlexTouch Pen Inject 28 Units into the skin every morning.   UBRELVY  100 MG TABS Take 1 tablet (100 mg total) by mouth as needed.   [DISCONTINUED] ALPRAZolam  (XANAX ) 0.25 MG tablet TAKE 1 TABLET BY MOUTH 3 TIMES A DAY AS NEEDED FOR ANXIETY   [DISCONTINUED] apixaban  (ELIQUIS ) 5 MG TABS tablet Take 1 tablet (5 mg total) by mouth 2 (two) times daily.   [DISCONTINUED] atorvastatin  (LIPITOR) 40 MG tablet Take 1 tablet (40 mg total) by mouth at bedtime.   [DISCONTINUED] Bempedoic Acid-Ezetimibe (NEXLIZET ) 180-10 MG TABS Take 1 tablet by mouth daily.   [DISCONTINUED] cloNIDine  (CATAPRES ) 0.1 MG tablet TAKE 1 TABLET BY MOUTH AT BEDTIME   [DISCONTINUED] hydrOXYzine  (ATARAX ) 25 MG tablet TAKE 1 TABLET BY MOUTH 3 TIMES A DAY   [DISCONTINUED] Oxycodone  HCl 20 MG TABS Take 1 tablet (20 mg total) by mouth in the morning, at noon, in the evening, and at bedtime. As needed for pain   [DISCONTINUED] potassium chloride  (KLOR-CON ) 10 MEQ tablet Take 1 tablet (10 mEq total) by mouth daily.   [DISCONTINUED]  zolpidem  (AMBIEN  CR) 6.25 MG CR tablet Take 6.25 mg by mouth at bedtime as needed.   No facility-administered medications prior to visit.    Review of Systems  HENT:  Positive for congestion.   Eyes: Negative.   Respiratory:  Positive for cough.   Cardiovascular: Negative.   Gastrointestinal: Negative.   Genitourinary: Negative.   Skin: Negative.   Neurological: Negative.   Endo/Heme/Allergies: Negative.   Psychiatric/Behavioral:  The patient has insomnia.        Objective:   BP 134/88   Pulse (!) 126   Temp 98.1 F (36.7 C)   Ht 5' 7 (1.702 m)   Wt 248 lb (112.5 kg)   SpO2 99%   BMI 38.84 kg/m   Vitals:   05/07/24 0846  BP: 134/88  Pulse: (!) 126  Temp: 98.1 F (36.7 C)  Height: 5' 7 (1.702 m)  Weight: 248 lb (112.5 kg)  SpO2: 99%  BMI (Calculated): 38.83    Physical Exam Vitals reviewed.  Constitutional:      General: She is not in acute distress.    Appearance: She is obese.  HENT:     Head: Normocephalic.     Nose: Nose normal.     Mouth/Throat:     Mouth: Mucous membranes are moist.  Eyes:     Extraocular Movements: Extraocular movements intact.     Pupils: Pupils are equal, round, and reactive to light.  Cardiovascular:     Rate and Rhythm: Normal rate and regular rhythm.     Heart sounds: No murmur heard. Pulmonary:     Effort: Pulmonary effort is normal.     Breath sounds: No rhonchi or rales.  Abdominal:     General: Abdomen is flat.     Palpations: There is no hepatomegaly, splenomegaly or mass.  Musculoskeletal:        General: Normal range of motion.     Cervical back: Normal range of  motion. No tenderness.  Skin:    General: Skin is warm and dry.  Neurological:     General: No focal deficit present.     Mental Status: She is alert and oriented to person, place, and time.     Cranial Nerves: No cranial nerve deficit.     Motor: No weakness.  Psychiatric:        Mood and Affect: Mood normal.        Behavior: Behavior normal.       No results found for any visits on 05/07/24.      Assessment & Plan:  As per problem list  Problem List Items Addressed This Visit       Cardiovascular and Mediastinum   HTN (hypertension)   Relevant Medications   apixaban  (ELIQUIS ) 5 MG TABS tablet   cloNIDine  (CATAPRES ) 0.1 MG tablet   atorvastatin  (LIPITOR) 40 MG tablet   potassium chloride  (KLOR-CON ) 10 MEQ tablet   Stroke (HCC)   Relevant Medications   apixaban  (ELIQUIS ) 5 MG TABS tablet   cloNIDine  (CATAPRES ) 0.1 MG tablet   atorvastatin  (LIPITOR) 40 MG tablet     Other   Other chronic pain   Relevant Medications   Oxycodone  HCl 20 MG TABS   Depression with anxiety   Relevant Medications   hydrOXYzine  (ATARAX ) 25 MG tablet   ALPRAZolam  (XANAX ) 0.25 MG tablet   Hx of venous thromboembolic disease - Primary   Other Visit Diagnoses       Hot flashes due to menopause       Relevant Medications   apixaban  (ELIQUIS ) 5 MG TABS tablet   cloNIDine  (CATAPRES ) 0.1 MG tablet   atorvastatin  (LIPITOR) 40 MG tablet       Return in about 1 month (around 06/06/2024) for Pain Management.   Total time spent: 20 minutes  Arzella Bitters, MD  05/07/2024   This document may have been prepared by [facility] Voice Recognition software and as such may include unintentional dictation errors.

## 2024-05-22 ENCOUNTER — Other Ambulatory Visit: Payer: Self-pay | Admitting: Internal Medicine

## 2024-05-22 DIAGNOSIS — I639 Cerebral infarction, unspecified: Secondary | ICD-10-CM

## 2024-06-03 DIAGNOSIS — R0902 Hypoxemia: Secondary | ICD-10-CM | POA: Diagnosis not present

## 2024-06-03 DIAGNOSIS — J45998 Other asthma: Secondary | ICD-10-CM | POA: Diagnosis not present

## 2024-06-06 ENCOUNTER — Ambulatory Visit (INDEPENDENT_AMBULATORY_CARE_PROVIDER_SITE_OTHER): Admitting: Internal Medicine

## 2024-06-06 ENCOUNTER — Encounter: Payer: Self-pay | Admitting: Internal Medicine

## 2024-06-06 DIAGNOSIS — Z0130 Encounter for examination of blood pressure without abnormal findings: Secondary | ICD-10-CM

## 2024-06-06 DIAGNOSIS — G8929 Other chronic pain: Secondary | ICD-10-CM | POA: Diagnosis not present

## 2024-06-06 MED ORDER — OXYCODONE HCL 20 MG PO TABS: 20.0000 mg | ORAL_TABLET | Freq: Four times a day (QID) | ORAL | 0 refills | Status: DC

## 2024-06-06 NOTE — Progress Notes (Signed)
 Established Patient Office Visit  Subjective:  Patient ID: Lindsey [name], female    DOB: 08-Apr-1961  Age: 63 y.o. MRN: [id number]  Chief Complaint  Patient presents with   Pain Management    Here for pain management follow up. Chronic pain well controlled on current analgesia. Last UDS satisfactory and pill counts have also been satisfactory. Complains that her spinal stimulator is nonfunctional.    No other concerns at this time.   Past Medical History:  Diagnosis Date   Allergy    Anemia    Arthritis    knees   CHF (congestive heart failure) (HCC)    COPD (chronic obstructive pulmonary disease) (HCC)    Diabetes mellitus without complication (HCC)    diet controlled   GERD (gastroesophageal reflux disease)    Heart attack (HCC) 2002   Hyperlipidemia    Hypertension    PUD (peptic ulcer disease)    Sleep apnea    can't tolerate CPAP.  Uses O2 only   Spinal cord stimulator status    for lower back pain   Stroke [facility]) 2004   mini-stroke   Vertigo    Wears dentures    full upper and lower    Past Surgical History:  Procedure Laterality Date   ABDOMINAL HYSTERECTOMY     back surgery     broken wrist     COLONOSCOPY WITH PROPOFOL  N/A 01/30/2017   Procedure: COLONOSCOPY WITH PROPOFOL ;  Surgeon: Ruel Kung, MD;  Location: [facility] ENDOSCOPY;  Service: Endoscopy;  Laterality: N/A;   COLONOSCOPY WITH PROPOFOL  N/A 02/20/2017   Procedure: Colonoscopy with propofol  ;  Surgeon: Ruel Kung, MD;  Location: [facility] ENDOSCOPY;  Service: Endoscopy;  Laterality: N/A;   COLONOSCOPY WITH PROPOFOL  N/A 03/13/2017   Procedure: COLONOSCOPY WITH PROPOFOL ;  Surgeon: Ruel Kung, MD;  Location: [facility] ENDOSCOPY;  Service: Endoscopy;  Laterality: N/A;   COLONOSCOPY WITH PROPOFOL  N/A 04/17/2017   Procedure: COLONOSCOPY WITH PROPOFOL ;  Surgeon: Kung Ruel, MD;  Location: [facility] ENDOSCOPY;  Service: Endoscopy;  Laterality: N/A;   ESOPHAGOGASTRODUODENOSCOPY N/A 05/05/2020   Procedure:  ESOPHAGOGASTRODUODENOSCOPY (EGD);  Surgeon: Toledo, Ladell POUR, MD;  Location: [facility] ENDOSCOPY;  Service: Gastroenterology;  Laterality: N/A;   ESOPHAGOGASTRODUODENOSCOPY (EGD) WITH PROPOFOL  N/A 09/28/2016   Procedure: ESOPHAGOGASTRODUODENOSCOPY (EGD) WITH PROPOFOL ;  Surgeon: Lamar ONEIDA Holmes, MD;  Location: [facility] ENDOSCOPY;  Service: Endoscopy;  Laterality: N/A;   ESOPHAGOGASTRODUODENOSCOPY (EGD) WITH PROPOFOL  N/A 03/13/2017   Procedure: ESOPHAGOGASTRODUODENOSCOPY (EGD) WITH PROPOFOL ;  Surgeon: Ruel Kung, MD;  Location: [facility] ENDOSCOPY;  Service: Endoscopy;  Laterality: N/A;   ESOPHAGOGASTRODUODENOSCOPY (EGD) WITH PROPOFOL  N/A 01/07/2024   Procedure: ESOPHAGOGASTRODUODENOSCOPY (EGD) WITH PROPOFOL ;  Surgeon: Jinny Carmine, MD;  Location: [facility] ENDOSCOPY;  Service: Endoscopy;  Laterality: N/A;   GALLBLADDER SURGERY     GASTRIC BYPASS     GIVENS CAPSULE STUDY N/A 03/13/2017   Procedure: GIVENS CAPSULE STUDY;  Surgeon: Ruel Kung, MD;  Location: [facility] ENDOSCOPY;  Service: Endoscopy;  Laterality: N/A;   HERNIA REPAIR     JOINT REPLACEMENT  1995   REPLACEMENT TOTAL KNEE     ROTATOR CUFF REPAIR      Social History   Socioeconomic History   Marital status: Widowed    Spouse name: Not on file   Number of children: Not on file   Years of education: Not on file   Highest education level: Not on file  Occupational History   Occupation: disabled  Tobacco Use   Smoking status: Never   Smokeless tobacco: Never  Vaping  Use   Vaping status: Never Used  Substance and Sexual Activity   Alcohol use: No    Alcohol/week: 0.0 standard drinks of alcohol   Drug use: No   Sexual activity: Yes  Other Topics Concern   Not on file  Social History Narrative   Not on file   Social Drivers of Health   Financial Resource Strain: Low Risk  (07/20/2022)   Received from [facility]   Overall Financial Resource Strain (CARDIA)    Difficulty of Paying Living Expenses: Not hard at all  Food Insecurity: No Food  Insecurity (07/20/2022)   Received from [facility]   Hunger Vital Sign    Within the past 12 months, you worried that your food would run out before you got the money to buy more.: Never true    Within the past 12 months, the food you bought just didn't last and you didn't have money to get more.: Never true  Transportation Needs: No Transportation Needs (07/20/2022)   Received from [facility]   PRAPARE - Transportation    Lack of Transportation (Medical): No    Lack of Transportation (Non-Medical): No  Physical Activity: Not on file  Stress: Not on file  Social Connections: Not on file  Intimate Partner Violence: Not on file    Family History  Problem Relation Age of Onset   Hypertension Father    COPD Father    Heart disease Father    Breast cancer Neg Hx     Allergies  Allergen Reactions   Morphine  Hives   Iodinated Contrast Media Hives   Aspirin Hives    Noted on MD progress notes and discussed with MD 09/02/15   Etodolac Hives   Ibuprofen Hives   Shellfish Allergy Hives   Tylenol  [Acetaminophen ] Hives    Upset stomach     Outpatient Medications Prior to Visit  Medication Sig   albuterol  (VENTOLIN  HFA) 108 (90 Base) MCG/ACT inhaler Inhale 2 puffs into the lungs every 4 (four) hours as needed for wheezing or shortness of breath.   ALPRAZolam  (XANAX ) 0.25 MG tablet Take 1 tablet (0.25 mg total) by mouth 3 (three) times daily as needed for anxiety.   apixaban  (ELIQUIS ) 5 MG TABS tablet Take 1 tablet (5 mg total) by mouth 2 (two) times daily.   atorvastatin  (LIPITOR) 40 MG tablet TAKE 1 TABLET BY MOUTH AT BEDTIME   baclofen  (LIORESAL ) 10 MG tablet TAKE 1 TABLET BY MOUTH 3 TIMES A DAY   Calcium  Carbonate-Vitamin D  (OYSTER SHELL CALCIUM  500 + D PO) Take 1 tablet by mouth daily.   cloNIDine  (CATAPRES ) 0.1 MG tablet TAKE 1 TABLET BY MOUTH AT BEDTIME   Daridorexant  HCl (QUVIVIQ ) 25 MG TABS Take 25 mg by mouth at bedtime. Take one tablet by mouth once daily at bedtime    diclofenac  Sodium (VOLTAREN ) 1 % GEL APPLY 4 GRAMS TOPICALLY TO NECK TWICE DAILY   FEROSUL 325 (65 Fe) MG tablet TAKE 1 TABLET BY MOUTH TWICE DAILY   fexofenadine  (ALLERGY RELIEF) 180 MG tablet Take 1 tablet (180 mg total) by mouth daily.   fluticasone  (FLONASE ) 50 MCG/ACT nasal spray Place 1 spray into both nostrils daily.   folic acid  (FOLVITE ) 1 MG tablet TAKE 1 TABLET BY MOUTH EVERY DAY   hydrOXYzine  (ATARAX ) 25 MG tablet Take 1 tablet (25 mg total) by mouth 3 (three) times daily.   Insulin  Pen Needle (B-D UF III MINI PEN NEEDLES) 31G X 5 MM MISC USE  WITH DEVICE TO INJECT LEVEMIR  NIGHTLY   JARDIANCE  25 MG TABS tablet TAKE 1 TABLET BY MOUTH DAILY   meclizine  (ANTIVERT ) 25 MG tablet TAKE 1 TABLET BY MOUTH 3 TIMES A DAY   mirtazapine  (REMERON ) 45 MG tablet Take 1 tablet (45 mg total) by mouth at bedtime.   MOUNJARO  10 MG/0.5ML Pen INJECT 10 MG UNDER THE SKIN ONCE WEEKLY   naloxone  (NARCAN ) nasal spray 4 mg/0.1 mL FOR SUSPECTED OPIOID OVERDOSE, ADMINISTER 1 SPRAY INTO ONE NOSTRIL. REPEAT IN OTHER NOSTRIL USING A NEW DEVICE AFTER 2-3 MINUTES IF NO OR MINIMAL RESPONSE. CALL 911 IF USED   NEXIUM  40 MG capsule Take 1 capsule (40 mg total) by mouth 2 (two) times daily.   oxybutynin  (DITROPAN ) 5 MG tablet Take 1 tablet (5 mg total) by mouth 2 (two) times daily.   Oxycodone  HCl 20 MG TABS Take 1 tablet (20 mg total) by mouth in the morning, at noon, in the evening, and at bedtime. As needed for pain   polyethylene glycol (MIRALAX  / GLYCOLAX ) 17 g packet Take 17 g by mouth daily as needed for mild constipation.   potassium chloride  (KLOR-CON ) 10 MEQ tablet Take 1 tablet (10 mEq total) by mouth daily.   TRESIBA  FLEXTOUCH 200 UNIT/ML FlexTouch Pen Inject 28 Units into the skin every morning.   UBRELVY  100 MG TABS Take 1 tablet (100 mg total) by mouth as needed.   [DISCONTINUED] Oxycodone  HCl 20 MG TABS Take 1 tablet (20 mg total) by mouth in the morning, at noon, in the evening, and at bedtime. As needed  for pain   glucose blood (ONETOUCH VERIO) test strip TEST TWICE A DAY   No facility-administered medications prior to visit.    Review of Systems  HENT:  Negative for congestion.   Eyes: Negative.   Respiratory:  Negative for cough.   Cardiovascular: Negative.   Gastrointestinal: Negative.   Genitourinary: Negative.   Skin: Negative.   Neurological: Negative.   Endo/Heme/Allergies: Negative.   Psychiatric/Behavioral:  The patient has insomnia.        Objective:   BP 120/82   Pulse (!) 117   Temp 97.6 F (36.4 C)   Ht 5' 7 (1.702 m)   Wt 255 lb 6.4 oz (115.8 kg)   SpO2 97%   BMI 40.00 kg/m   Vitals:   06/06/24 1037  BP: 120/82  Pulse: (!) 117  Temp: 97.6 F (36.4 C)  Height: 5' 7 (1.702 m)  Weight: 255 lb 6.4 oz (115.8 kg)  SpO2: 97%  BMI (Calculated): 39.99    Physical Exam Vitals reviewed.  Constitutional:      General: She is not in acute distress.    Appearance: She is obese.  HENT:     Head: Normocephalic.     Nose: Nose normal.     Mouth/Throat:     Mouth: Mucous membranes are moist.  Eyes:     Extraocular Movements: Extraocular movements intact.     Pupils: Pupils are equal, round, and reactive to light.  Cardiovascular:     Rate and Rhythm: Normal rate and regular rhythm.     Heart sounds: No murmur heard. Pulmonary:     Effort: Pulmonary effort is normal.     Breath sounds: No rhonchi or rales.  Abdominal:     General: Abdomen is flat.     Palpations: There is no hepatomegaly, splenomegaly or mass.  Musculoskeletal:        General: Normal range of motion.  Cervical back: Normal range of motion. No tenderness.  Skin:    General: Skin is warm and dry.  Neurological:     General: No focal deficit present.     Mental Status: She is alert and oriented to person, place, and time.     Cranial Nerves: No cranial nerve deficit.     Motor: No weakness.  Psychiatric:        Mood and Affect: Mood normal.        Behavior: Behavior normal.       No results found for any visits on 06/06/24.      Assessment & Plan:  As per problem list.  Problem List Items Addressed This Visit       Other   Other chronic pain   Relevant Medications   Oxycodone  HCl 20 MG TABS    Return in about 1 month (around 07/07/2024) for Pain Management.   Total time spent: 20 minutes  Sherrill Cinderella Perry, MD  06/06/2024   This document may have been prepared by [facility] Voice Recognition software and as such may include unintentional dictation errors.

## 2024-06-30 ENCOUNTER — Other Ambulatory Visit: Payer: Self-pay | Admitting: Internal Medicine

## 2024-07-04 DIAGNOSIS — J45998 Other asthma: Secondary | ICD-10-CM | POA: Diagnosis not present

## 2024-07-04 DIAGNOSIS — R0902 Hypoxemia: Secondary | ICD-10-CM | POA: Diagnosis not present

## 2024-07-07 ENCOUNTER — Ambulatory Visit (INDEPENDENT_AMBULATORY_CARE_PROVIDER_SITE_OTHER): Admitting: Internal Medicine

## 2024-07-07 VITALS — BP 130/86 | HR 123 | Temp 97.6°F | Ht 67.0 in | Wt 252.4 lb

## 2024-07-07 DIAGNOSIS — J209 Acute bronchitis, unspecified: Secondary | ICD-10-CM | POA: Diagnosis not present

## 2024-07-07 DIAGNOSIS — F418 Other specified anxiety disorders: Secondary | ICD-10-CM | POA: Diagnosis not present

## 2024-07-07 DIAGNOSIS — J301 Allergic rhinitis due to pollen: Secondary | ICD-10-CM | POA: Diagnosis not present

## 2024-07-07 DIAGNOSIS — Z0130 Encounter for examination of blood pressure without abnormal findings: Secondary | ICD-10-CM

## 2024-07-07 DIAGNOSIS — G8929 Other chronic pain: Secondary | ICD-10-CM

## 2024-07-07 MED ORDER — OXYCODONE HCL 20 MG PO TABS: 20.0000 mg | ORAL_TABLET | Freq: Four times a day (QID) | ORAL | 0 refills | Status: DC

## 2024-07-07 MED ORDER — CETIRIZINE HCL 10 MG PO TABS: 10.0000 mg | ORAL_TABLET | Freq: Every day | ORAL | 2 refills | Status: DC

## 2024-07-07 MED ORDER — AZELASTINE HCL 137 MCG/SPRAY NA SOLN: 1.0000 | Freq: Every day | NASAL | 2 refills | Status: DC

## 2024-07-07 MED ORDER — ALBUTEROL SULFATE HFA 108 (90 BASE) MCG/ACT IN AERS: 2.0000 | INHALATION_SPRAY | RESPIRATORY_TRACT | 3 refills | Status: AC | PRN

## 2024-07-07 MED ORDER — ALPRAZOLAM 0.25 MG PO TABS: 0.2500 mg | ORAL_TABLET | Freq: Three times a day (TID) | ORAL | 1 refills | Status: DC | PRN

## 2024-07-07 NOTE — Progress Notes (Signed)
 Established Patient Office Visit  Subjective:  Patient ID: Lindsey [name], female    DOB: [birth year]  Age: 63 y.o. MRN: [id number]  Chief Complaint  Patient presents with   Pain Management    PM    Here for pain management follow up. Chronic pain well controlled on current analgesia. Last UDS satisfactory and pill counts have also been satisfactory. C/o unproductive cough x 2 wks with associated nasal congestion but no post nasal drip . Also c/o wheezing and SOB but no fevers or malaise.    No other concerns at this time.   Past Medical History:  Diagnosis Date   Allergy    Anemia    Arthritis    knees   CHF (congestive heart failure) (HCC)    COPD (chronic obstructive pulmonary disease) (HCC)    Diabetes mellitus without complication (HCC)    diet controlled   GERD (gastroesophageal reflux disease)    Heart attack (HCC) 2002   Hyperlipidemia    Hypertension    PUD (peptic ulcer disease)    Sleep apnea    can't tolerate CPAP.  Uses O2 only   Spinal cord stimulator status    for lower back pain   Stroke [facility]) 2004   mini-stroke   Vertigo    Wears dentures    full upper and lower    Past Surgical History:  Procedure Laterality Date   ABDOMINAL HYSTERECTOMY     back surgery     broken wrist     COLONOSCOPY WITH PROPOFOL  N/A 01/30/2017   Procedure: COLONOSCOPY WITH PROPOFOL ;  Surgeon: Ruel Kung, MD;  Location: [facility] ENDOSCOPY;  Service: Endoscopy;  Laterality: N/A;   COLONOSCOPY WITH PROPOFOL  N/A 02/20/2017   Procedure: Colonoscopy with propofol  ;  Surgeon: Ruel Kung, MD;  Location: [facility] ENDOSCOPY;  Service: Endoscopy;  Laterality: N/A;   COLONOSCOPY WITH PROPOFOL  N/A 03/13/2017   Procedure: COLONOSCOPY WITH PROPOFOL ;  Surgeon: Ruel Kung, MD;  Location: [facility] ENDOSCOPY;  Service: Endoscopy;  Laterality: N/A;   COLONOSCOPY WITH PROPOFOL  N/A 04/17/2017   Procedure: COLONOSCOPY WITH PROPOFOL ;  Surgeon: Kung Ruel, MD;  Location: [facility] ENDOSCOPY;  Service: Endoscopy;   Laterality: N/A;   ESOPHAGOGASTRODUODENOSCOPY N/A 05/05/2020   Procedure: ESOPHAGOGASTRODUODENOSCOPY (EGD);  Surgeon: Toledo, Ladell POUR, MD;  Location: [facility] ENDOSCOPY;  Service: Gastroenterology;  Laterality: N/A;   ESOPHAGOGASTRODUODENOSCOPY (EGD) WITH PROPOFOL  N/A 09/28/2016   Procedure: ESOPHAGOGASTRODUODENOSCOPY (EGD) WITH PROPOFOL ;  Surgeon: Lamar ONEIDA Holmes, MD;  Location: [facility] ENDOSCOPY;  Service: Endoscopy;  Laterality: N/A;   ESOPHAGOGASTRODUODENOSCOPY (EGD) WITH PROPOFOL  N/A 03/13/2017   Procedure: ESOPHAGOGASTRODUODENOSCOPY (EGD) WITH PROPOFOL ;  Surgeon: Ruel Kung, MD;  Location: [facility] ENDOSCOPY;  Service: Endoscopy;  Laterality: N/A;   ESOPHAGOGASTRODUODENOSCOPY (EGD) WITH PROPOFOL  N/A 01/07/2024   Procedure: ESOPHAGOGASTRODUODENOSCOPY (EGD) WITH PROPOFOL ;  Surgeon: Jinny Carmine, MD;  Location: [facility] ENDOSCOPY;  Service: Endoscopy;  Laterality: N/A;   GALLBLADDER SURGERY     GASTRIC BYPASS     GIVENS CAPSULE STUDY N/A 03/13/2017   Procedure: GIVENS CAPSULE STUDY;  Surgeon: Ruel Kung, MD;  Location: [facility] ENDOSCOPY;  Service: Endoscopy;  Laterality: N/A;   HERNIA REPAIR     JOINT REPLACEMENT  1995   REPLACEMENT TOTAL KNEE     ROTATOR CUFF REPAIR      Social History   Socioeconomic History   Marital status: Widowed    Spouse name: Not on file   Number of children: Not on file   Years of education: Not on file   Highest education level: Not on  file  Occupational History   Occupation: disabled  Tobacco Use   Smoking status: Never   Smokeless tobacco: Never  Vaping Use   Vaping status: Never Used  Substance and Sexual Activity   Alcohol use: No    Alcohol/week: 0.0 standard drinks of alcohol   Drug use: No   Sexual activity: Yes  Other Topics Concern   Not on file  Social History Narrative   Not on file   Social Drivers of Health   Financial Resource Strain: Low Risk  (07/20/2022)   Received from [facility]   Overall Financial Resource Strain (CARDIA)     Difficulty of Paying Living Expenses: Not hard at all  Food Insecurity: No Food Insecurity (07/20/2022)   Received from [facility]   Hunger Vital Sign    Within the past 12 months, you worried that your food would run out before you got the money to buy more.: Never true    Within the past 12 months, the food you bought just didn't last and you didn't have money to get more.: Never true  Transportation Needs: No Transportation Needs (07/20/2022)   Received from [facility]   PRAPARE - Transportation    Lack of Transportation (Medical): No    Lack of Transportation (Non-Medical): No  Physical Activity: Not on file  Stress: Not on file  Social Connections: Not on file  Intimate Partner Violence: Not on file    Family History  Problem Relation Age of Onset   Hypertension Father    COPD Father    Heart disease Father    Breast cancer Neg Hx     Allergies  Allergen Reactions   Morphine  Hives   Iodinated Contrast Media Hives   Aspirin Hives    Noted on MD progress notes and discussed with MD 09/02/15   Etodolac Hives   Ibuprofen Hives   Shellfish Allergy Hives   Tylenol  [Acetaminophen ] Hives    Upset stomach     Outpatient Medications Prior to Visit  Medication Sig   apixaban  (ELIQUIS ) 5 MG TABS tablet Take 1 tablet (5 mg total) by mouth 2 (two) times daily.   atorvastatin  (LIPITOR) 40 MG tablet TAKE 1 TABLET BY MOUTH AT BEDTIME   baclofen  (LIORESAL ) 10 MG tablet TAKE 1 TABLET BY MOUTH 3 TIMES A DAY   Calcium  Carbonate-Vitamin D  (OYSTER SHELL CALCIUM  500 + D PO) Take 1 tablet by mouth daily.   cloNIDine  (CATAPRES ) 0.1 MG tablet TAKE 1 TABLET BY MOUTH AT BEDTIME   Daridorexant  HCl (QUVIVIQ ) 25 MG TABS Take 25 mg by mouth at bedtime. Take one tablet by mouth once daily at bedtime (Patient taking differently: Take 50 mg by mouth at bedtime. Take one tablet by mouth once daily at bedtime)   diclofenac  Sodium (VOLTAREN ) 1 % GEL APPLY 4 GRAMS TOPICALLY TO NECK TWICE DAILY    ferrous sulfate  (FEROSUL) 325 (65 FE) MG tablet TAKE 1 TABLET BY MOUTH 2 TIMES A DAY   fluticasone  (FLONASE ) 50 MCG/ACT nasal spray Place 1 spray into both nostrils daily.   folic acid  (FOLVITE ) 1 MG tablet TAKE 1 TABLET BY MOUTH EVERY DAY   hydrOXYzine  (ATARAX ) 25 MG tablet Take 1 tablet (25 mg total) by mouth 3 (three) times daily.   Insulin  Pen Needle (B-D UF III MINI PEN NEEDLES) 31G X 5 MM MISC USE WITH DEVICE TO INJECT LEVEMIR  NIGHTLY   JARDIANCE  25 MG TABS tablet TAKE 1 TABLET BY MOUTH DAILY   meclizine  (  ANTIVERT ) 25 MG tablet TAKE 1 TABLET BY MOUTH 3 TIMES A DAY   mirtazapine  (REMERON ) 45 MG tablet Take 1 tablet (45 mg total) by mouth at bedtime.   MOUNJARO  10 MG/0.5ML Pen INJECT 10 MG UNDER THE SKIN ONCE WEEKLY   naloxone  (NARCAN ) nasal spray 4 mg/0.1 mL FOR SUSPECTED OPIOID OVERDOSE, ADMINISTER 1 SPRAY INTO ONE NOSTRIL. REPEAT IN OTHER NOSTRIL USING A NEW DEVICE AFTER 2-3 MINUTES IF NO OR MINIMAL RESPONSE. CALL 911 IF USED   NEXIUM  40 MG capsule Take 1 capsule (40 mg total) by mouth 2 (two) times daily.   oxybutynin  (DITROPAN ) 5 MG tablet Take 1 tablet (5 mg total) by mouth 2 (two) times daily.   polyethylene glycol (MIRALAX  / GLYCOLAX ) 17 g packet Take 17 g by mouth daily as needed for mild constipation.   potassium chloride  (KLOR-CON ) 10 MEQ tablet Take 1 tablet (10 mEq total) by mouth daily.   TRESIBA  FLEXTOUCH 200 UNIT/ML FlexTouch Pen Inject 28 Units into the skin every morning.   UBRELVY  100 MG TABS Take 1 tablet (100 mg total) by mouth as needed.   [DISCONTINUED] albuterol  (VENTOLIN  HFA) 108 (90 Base) MCG/ACT inhaler Inhale 2 puffs into the lungs every 4 (four) hours as needed for wheezing or shortness of breath.   [DISCONTINUED] ALPRAZolam  (XANAX ) 0.25 MG tablet Take 1 tablet (0.25 mg total) by mouth 3 (three) times daily as needed for anxiety.   [DISCONTINUED] fexofenadine  (ALLERGY RELIEF) 180 MG tablet Take 1 tablet (180 mg total) by mouth daily.   [DISCONTINUED] Oxycodone  HCl  20 MG TABS Take 1 tablet (20 mg total) by mouth in the morning, at noon, in the evening, and at bedtime. As needed for pain   glucose blood (ONETOUCH VERIO) test strip TEST TWICE A DAY   [DISCONTINUED] Oxycodone  HCl 20 MG TABS Take 1 tablet (20 mg total) by mouth in the morning, at noon, in the evening, and at bedtime. As needed for pain   No facility-administered medications prior to visit.    Review of Systems  HENT:  Negative for congestion.   Eyes: Negative.   Respiratory:  Negative for cough.   Cardiovascular: Negative.   Gastrointestinal: Negative.   Genitourinary: Negative.   Skin: Negative.   Neurological: Negative.   Endo/Heme/Allergies: Negative.   Psychiatric/Behavioral:  The patient has insomnia.        Objective:   BP 130/86   Pulse (!) 123   Temp 97.6 F (36.4 C)   Ht 5' 7 (1.702 m)   Wt 252 lb 6.4 oz (114.5 kg)   SpO2 97%   BMI 39.53 kg/m   Vitals:   07/07/24 1048  BP: 130/86  Pulse: (!) 123  Temp: 97.6 F (36.4 C)  Height: 5' 7 (1.702 m)  Weight: 252 lb 6.4 oz (114.5 kg)  SpO2: 97%  BMI (Calculated): 39.52    Physical Exam Vitals reviewed.  Constitutional:      General: She is not in acute distress.    Appearance: She is obese.  HENT:     Head: Normocephalic.     Nose: Nose normal.     Mouth/Throat:     Mouth: Mucous membranes are moist.  Eyes:     Extraocular Movements: Extraocular movements intact.     Pupils: Pupils are equal, round, and reactive to light.  Cardiovascular:     Rate and Rhythm: Normal rate and regular rhythm.     Heart sounds: No murmur heard. Pulmonary:     Effort: Pulmonary  effort is normal.     Breath sounds: No rhonchi or rales.  Abdominal:     General: Abdomen is flat.     Palpations: There is no hepatomegaly, splenomegaly or mass.  Musculoskeletal:        General: Normal range of motion.     Cervical back: Normal range of motion. No tenderness.  Skin:    General: Skin is warm and dry.  Neurological:      General: No focal deficit present.     Mental Status: She is alert and oriented to person, place, and time.     Cranial Nerves: No cranial nerve deficit.     Motor: No weakness.  Psychiatric:        Mood and Affect: Mood normal.        Behavior: Behavior normal.      No results found for any visits on 07/07/24.  No results found for this or any previous visit (from the past 2160 hours).    Assessment & Plan:  Kaelin was seen today for pain management.  Seasonal allergic rhinitis due to pollen -     Azelastine  HCl; Place 1 spray into the nose daily.  Dispense: 30 mL; Refill: 2 -     Cetirizine  HCl; Take 1 tablet (10 mg total) by mouth daily.  Dispense: 30 tablet; Refill: 2  Acute bronchitis, unspecified organism -     Albuterol  Sulfate HFA; Inhale 2 puffs into the lungs every 4 (four) hours as needed for wheezing or shortness of breath.  Dispense: 18 g; Refill: 3  Other chronic pain -     oxyCODONE  HCl; Take 1 tablet (20 mg total) by mouth in the morning, at noon, in the evening, and at bedtime. As needed for pain  Dispense: 120 tablet; Refill: 0  Depression with anxiety -     ALPRAZolam ; Take 1 tablet (0.25 mg total) by mouth 3 (three) times daily as needed for anxiety.  Dispense: 90 tablet; Refill: 1    Problem List Items Addressed This Visit       Other   Other chronic pain   Relevant Medications   Oxycodone  HCl 20 MG TABS   Depression with anxiety   Relevant Medications   ALPRAZolam  (XANAX ) 0.25 MG tablet   Other Visit Diagnoses       Seasonal allergic rhinitis due to pollen    -  Primary   Relevant Medications   Azelastine  HCl 137 MCG/SPRAY SOLN   cetirizine  (ZYRTEC  ALLERGY) 10 MG tablet     Acute bronchitis, unspecified organism       Relevant Medications   albuterol  (VENTOLIN  HFA) 108 (90 Base) MCG/ACT inhaler       No follow-ups on file.   Total time spent: 20 minutes  Sherrill Cinderella Perry, MD  07/07/2024   This document may have been  prepared by [facility] Voice Recognition software and as such may include unintentional dictation errors.

## 2024-07-19 ENCOUNTER — Other Ambulatory Visit: Payer: Self-pay | Admitting: Internal Medicine

## 2024-07-19 DIAGNOSIS — E1122 Type 2 diabetes mellitus with diabetic chronic kidney disease: Secondary | ICD-10-CM

## 2024-07-19 DIAGNOSIS — E669 Obesity, unspecified: Secondary | ICD-10-CM

## 2024-07-31 ENCOUNTER — Encounter: Payer: Self-pay | Admitting: *Deleted

## 2024-07-31 ENCOUNTER — Other Ambulatory Visit: Payer: Self-pay

## 2024-07-31 ENCOUNTER — Emergency Department
Admission: EM | Admit: 2024-07-31 | Discharge: 2024-08-01 | Disposition: A | Discharge status: Home / Self Care | Attending: Emergency Medicine | Admitting: Emergency Medicine

## 2024-07-31 ENCOUNTER — Emergency Department

## 2024-07-31 DIAGNOSIS — B9789 Other viral agents as the cause of diseases classified elsewhere: Secondary | ICD-10-CM | POA: Diagnosis not present

## 2024-07-31 DIAGNOSIS — Z743 Need for continuous supervision: Secondary | ICD-10-CM | POA: Diagnosis not present

## 2024-07-31 DIAGNOSIS — J449 Chronic obstructive pulmonary disease, unspecified: Secondary | ICD-10-CM | POA: Insufficient documentation

## 2024-07-31 DIAGNOSIS — I2510 Atherosclerotic heart disease of native coronary artery without angina pectoris: Secondary | ICD-10-CM | POA: Diagnosis not present

## 2024-07-31 DIAGNOSIS — R6889 Other general symptoms and signs: Secondary | ICD-10-CM | POA: Diagnosis not present

## 2024-07-31 DIAGNOSIS — E119 Type 2 diabetes mellitus without complications: Secondary | ICD-10-CM | POA: Diagnosis not present

## 2024-07-31 DIAGNOSIS — I517 Cardiomegaly: Secondary | ICD-10-CM | POA: Diagnosis not present

## 2024-07-31 DIAGNOSIS — Z9682 Presence of neurostimulator: Secondary | ICD-10-CM | POA: Diagnosis not present

## 2024-07-31 DIAGNOSIS — I509 Heart failure, unspecified: Secondary | ICD-10-CM | POA: Insufficient documentation

## 2024-07-31 DIAGNOSIS — R509 Fever, unspecified: Secondary | ICD-10-CM | POA: Diagnosis not present

## 2024-07-31 DIAGNOSIS — J069 Acute upper respiratory infection, unspecified: Secondary | ICD-10-CM | POA: Diagnosis not present

## 2024-07-31 DIAGNOSIS — I110 Hypertensive heart disease with heart failure: Secondary | ICD-10-CM | POA: Diagnosis not present

## 2024-07-31 DIAGNOSIS — I10 Essential (primary) hypertension: Secondary | ICD-10-CM | POA: Diagnosis not present

## 2024-07-31 DIAGNOSIS — R059 Cough, unspecified: Secondary | ICD-10-CM | POA: Diagnosis not present

## 2024-07-31 LAB — RESP PANEL BY RT-PCR (RSV, FLU A&B, COVID)  RVPGX2
Influenza A by PCR: NEGATIVE
Influenza B by PCR: NEGATIVE
Resp Syncytial Virus by PCR: NEGATIVE
SARS Coronavirus 2 by RT PCR: NEGATIVE

## 2024-07-31 NOTE — ED Triage Notes (Signed)
 Pt arrives via EMS from home c/o bodyaches and cough

## 2024-07-31 NOTE — ED Triage Notes (Addendum)
 Pt brought in via ems from home.  Pt has fever, cough, bodyaches for 2 days.  Nonsmoker.  No chest pain or sob.  Pt alert  speech clear.

## 2024-07-31 NOTE — ED Provider Notes (Incomplete)
 [facility] Provider Note    Event Date/Time   First MD Initiated Contact with Patient 07/31/24 2353     (approximate)   History   Fever and Cough   HPI  Lindsey [name] is a 63 y.o. female   Past medical history of ***    Independent Historian contributed to assessment above: ***  External Medical Documents Reviewed: ***      Physical Exam   Triage Vital Signs: ED Triage Vitals  Encounter Vitals Group     BP 07/31/24 2148 (!) 144/85     Girls Systolic BP Percentile --      Girls Diastolic BP Percentile --      Boys Systolic BP Percentile --      Boys Diastolic BP Percentile --      Pulse Rate 07/31/24 2148 94     Resp 07/31/24 2148 18     Temp 07/31/24 2148 98.7 F (37.1 C)     Temp Source 07/31/24 2148 Oral     SpO2 07/31/24 2136 97 %     Weight 07/31/24 2145 245 lb (111.1 kg)     Height 07/31/24 2145 5' 7 (1.702 m)     Head Circumference --      Peak Flow --      Pain Score 07/31/24 2144 10     Pain Loc --      Pain Education --      Exclude from Growth Chart --     Most recent vital signs: Vitals:   07/31/24 2136 07/31/24 2148  BP:  (!) 144/85  Pulse:  94  Resp:  18  Temp:  98.7 F (37.1 C)  SpO2: 97% 95%    General: Awake, no distress. *** CV:  Good peripheral perfusion. *** Resp:  Normal effort. *** Abd:  No distention. *** Other:  ***   ED Results / Procedures / Treatments   Labs (all labs ordered are listed, but only abnormal results are displayed) Labs Reviewed  RESP PANEL BY RT-PCR (RSV, FLU A&B, COVID)  [id number]     I ordered and reviewed the above labs they are notable for ***  EKG  ED ECG REPORT I, Ginnie Shams, the attending physician, personally viewed and interpreted this ECG.   Date: 07/31/2024  EKG Time: ***  Rate: ***  Rhythm: {ekg findings:315101}  Axis: ***  Intervals:{conduction defects:17367}  ST&T Change: ***    RADIOLOGY I independently reviewed and interpreted *** I also  reviewed radiologist's formal read.   PROCEDURES:  Critical Care performed: {CriticalCareYesNo:19197::Yes, see critical care procedure note(s),No}  Procedures   MEDICATIONS ORDERED IN ED: Medications - No data to display  External physician / consultants:  I spoke with *** regarding care plan for this patient.   IMPRESSION / MDM / ASSESSMENT AND PLAN / ED COURSE  I reviewed the triage vital signs and the nursing notes.                                Patient's presentation is most consistent with {EM COPA:27473}  Differential diagnosis includes, but is not limited to, ***   ***The patient is on the cardiac monitor to evaluate for evidence of arrhythmia and/or significant heart rate changes.  MDM:  ***  I considered hospitalization for admission or observation ***        FINAL CLINICAL IMPRESSION(S) / ED DIAGNOSES   Final diagnoses:  None  Rx / DC Orders   ED Discharge Orders     None        Note:  This document was prepared using Dragon voice recognition software and may include unintentional dictation errors.

## 2024-07-31 NOTE — ED Provider Notes (Addendum)
 [facility] Provider Note    Event Date/Time   First MD Initiated Contact with Patient 07/31/24 2353     (approximate)   History   Fever and Cough   HPI  Lindsey [name] is a 63 y.o. female   Past medical history of insulin -dependent type 2 diabetic, stroke, CHF, CAD, COPD, hypertension hyperlipidemia presents to the emergency department with 2 days of dry cough, congestion, sore throat, myalgias, fevers/chills, with no known sick contact.  She has had some loose stools but otherwise denies GI or GU complaints.  On Eliquis  for PE and is fully compliant  External Medical Documents Reviewed: Internal medicine outpatient notes from August 2025      Physical Exam   Triage Vital Signs: ED Triage Vitals  Encounter Vitals Group     BP 07/31/24 2148 (!) 144/85     Girls Systolic BP Percentile --      Girls Diastolic BP Percentile --      Boys Systolic BP Percentile --      Boys Diastolic BP Percentile --      Pulse Rate 07/31/24 2148 94     Resp 07/31/24 2148 18     Temp 07/31/24 2148 98.7 F (37.1 C)     Temp Source 07/31/24 2148 Oral     SpO2 07/31/24 2136 97 %     Weight 07/31/24 2145 245 lb (111.1 kg)     Height 07/31/24 2145 5' 7 (1.702 m)     Head Circumference --      Peak Flow --      Pain Score 07/31/24 2144 10     Pain Loc --      Pain Education --      Exclude from Growth Chart --     Most recent vital signs: Vitals:   08/01/24 0030 08/01/24 0050  BP: (!) 134/93   Pulse: 70   Resp: 16   Temp: 98.1 F (36.7 C)   SpO2: 100% 100%    General: Awake, no distress.  CV:  Good peripheral perfusion.  Resp:  Normal effort.  Abd:  No distention.  Other:  She is bundled up in a bunch of blankets.  Vital signs are normal without fever.  Overall nontoxic-appearing neck supple full range of motion no focality or wheezing on lung exam.  Soft benign abdominal exam.  Heart sounds normal rate and rhythm without murmur.  Posterior  oropharynx without obvious exudate erythema or masses.  Airway intact.   ED Results / Procedures / Treatments   Labs (all labs ordered are listed, but only abnormal results are displayed) Labs Reviewed  BASIC METABOLIC PANEL WITH GFR - Abnormal; Notable for the following components:      Result Value   CO2 19 (*)    Glucose, Bld 124 (*)    Calcium  8.4 (*)    All other components within normal limits  RESP PANEL BY RT-PCR (RSV, FLU A&B, COVID)  [id number]  CBC WITH DIFFERENTIAL/PLATELET     I ordered and reviewed the above labs they are notable for negative for COVID flu and RSV  EKG  ED ECG REPORT I, Ginnie Shams, the attending physician, personally viewed and interpreted this ECG.   Date: 07/31/2024  EKG Time: 0034  Rate: 76  Rhythm: Sinus rhythm  Axis: Normal  Intervals:nl  ST&T Change: No STEMI    RADIOLOGY I independently reviewed and interpreted chest x-ray and I see no obvious focality or pneumothorax I also reviewed  radiologist's formal read.   PROCEDURES:  Critical Care performed: No  Procedures   MEDICATIONS ORDERED IN ED: Medications  sodium chloride  0.9 % bolus 1,000 mL (1,000 mLs Intravenous New Bag/Given 08/01/24 0126)     IMPRESSION / MDM / ASSESSMENT AND PLAN / ED COURSE  I reviewed the triage vital signs and the nursing notes.                                Patient's presentation is most consistent with acute presentation with potential threat to life or bodily function.  Differential diagnosis includes, but is not limited to, viral URI, viral syndrome, bacterial pneumonia, bacterial infection, sepsis/meningitis considered but less likely, consider DKA hyperglycemia   The patient is on the cardiac monitor to evaluate for evidence of arrhythmia and/or significant heart rate changes.  MDM:    Symptoms do match with viral URI, no evidence of bacterial pneumonia on my auscultation of the lungs nor chest x-ray to suggest bacterial source  requiring antibiotic therapy.  Overall nontoxic-appearing doubt sepsis or meningitis.  No wheezing to suggest COPD or asthma exacerbation and normal oxygenation breathing comfortably on room air.   \ Given her insulin -dependent diabetic check blood sugars, basic labs, metabolic panel, poor p.o. intake will give fluid bolus.  Ultimately patient looks well enough with normal vital signs no respiratory distress low concern for sepsis, normal oxygenation, and no marked lab abnormalities I think she can go home and be treated as outpatient with PMD follow-up       FINAL CLINICAL IMPRESSION(S) / ED DIAGNOSES   Final diagnoses:  Viral URI with cough     Rx / DC Orders   ED Discharge Orders     None        Note:  This document was prepared using Dragon voice recognition software and may include unintentional dictation errors.    Cyrena Mylar, MD 08/01/24 FERDINAND    Cyrena Mylar, MD 08/01/24 0200

## 2024-08-01 ENCOUNTER — Other Ambulatory Visit: Payer: Self-pay

## 2024-08-01 LAB — CBC WITH DIFFERENTIAL/PLATELET
Abs Immature Granulocytes: 0.01 K/uL (ref 0.00–0.07)
Basophils Absolute: 0 K/uL (ref 0.0–0.1)
Basophils Relative: 1 %
Eosinophils Absolute: 0.1 K/uL (ref 0.0–0.5)
Eosinophils Relative: 1 %
HCT: 42.7 % (ref 36.0–46.0)
Hemoglobin: 13.6 g/dL (ref 12.0–15.0)
Immature Granulocytes: 0 %
Lymphocytes Relative: 40 %
Lymphs Abs: 3 K/uL (ref 0.7–4.0)
MCH: 29.2 pg (ref 26.0–34.0)
MCHC: 31.9 g/dL (ref 30.0–36.0)
MCV: 91.8 fL (ref 80.0–100.0)
Monocytes Absolute: 0.6 K/uL (ref 0.1–1.0)
Monocytes Relative: 8 %
Neutro Abs: 3.9 K/uL (ref 1.7–7.7)
Neutrophils Relative %: 50 %
Platelets: 286 K/uL (ref 150–400)
RBC: 4.65 MIL/uL (ref 3.87–5.11)
RDW: 14.4 % (ref 11.5–15.5)
WBC: 7.7 K/uL (ref 4.0–10.5)
nRBC: 0 % (ref 0.0–0.2)

## 2024-08-01 LAB — BASIC METABOLIC PANEL WITH GFR
Anion gap: 10 (ref 5–15)
BUN: 23 mg/dL (ref 8–23)
CO2: 19 mmol/L — ABNORMAL LOW (ref 22–32)
Calcium: 8.4 mg/dL — ABNORMAL LOW (ref 8.9–10.3)
Chloride: 110 mmol/L (ref 98–111)
Creatinine, Ser: 0.98 mg/dL (ref 0.44–1.00)
GFR, Estimated: >60 mL/min (ref >60)
Glucose, Bld: 124 mg/dL — ABNORMAL HIGH (ref 70–99)
Potassium: 4.4 mmol/L (ref 3.5–5.1)
Sodium: 139 mmol/L (ref 135–145)

## 2024-08-01 MED ORDER — SODIUM CHLORIDE 0.9 % IV BOLUS
1000.0000 mL | Freq: Once | INTRAVENOUS | Status: AC
  Administered 2024-08-01: 1000 mL via INTRAVENOUS

## 2024-08-01 NOTE — Discharge Instructions (Signed)
 Thank you for choosing [place] for your health care today!  Please see your primary doctor this week for a follow up appointment.   If you have any new, worsening, or unexpected symptoms call your doctor right away or come back to the emergency department for reevaluation.  It was my pleasure to care for you today.   Daneil Dan Modesto Charon, MD

## 2024-08-04 DIAGNOSIS — R0902 Hypoxemia: Secondary | ICD-10-CM | POA: Diagnosis not present

## 2024-08-04 DIAGNOSIS — J45998 Other asthma: Secondary | ICD-10-CM | POA: Diagnosis not present

## 2024-08-05 ENCOUNTER — Telehealth: Payer: Self-pay

## 2024-08-05 ENCOUNTER — Encounter: Admitting: Internal Medicine

## 2024-08-05 ENCOUNTER — Other Ambulatory Visit: Payer: Self-pay

## 2024-08-05 DIAGNOSIS — G8929 Other chronic pain: Secondary | ICD-10-CM

## 2024-08-05 MED ORDER — OXYCODONE HCL 20 MG PO TABS: 20.0000 mg | ORAL_TABLET | Freq: Four times a day (QID) | ORAL | 0 refills | Status: DC

## 2024-08-05 NOTE — Telephone Encounter (Signed)
 Patient informed and rx pended to [facility]

## 2024-08-05 NOTE — Telephone Encounter (Signed)
 Patient called today and said she needed to r/s her pain management appt, she said she's sick, she's been sick since the beginning of week, she's got chills and can't get her temp about 95 patient informed her we would call her back with what your decision was on sending her rx or not

## 2024-09-03 ENCOUNTER — Encounter: Payer: Self-pay | Admitting: Internal Medicine

## 2024-09-03 ENCOUNTER — Ambulatory Visit (INDEPENDENT_AMBULATORY_CARE_PROVIDER_SITE_OTHER): Admitting: Internal Medicine

## 2024-09-03 VITALS — BP 128/84 | HR 108 | Temp 97.5°F | Ht 67.0 in | Wt 252.0 lb

## 2024-09-03 DIAGNOSIS — Z794 Long term (current) use of insulin: Secondary | ICD-10-CM

## 2024-09-03 DIAGNOSIS — E1122 Type 2 diabetes mellitus with diabetic chronic kidney disease: Secondary | ICD-10-CM

## 2024-09-03 DIAGNOSIS — Z0130 Encounter for examination of blood pressure without abnormal findings: Secondary | ICD-10-CM

## 2024-09-03 DIAGNOSIS — B379 Candidiasis, unspecified: Secondary | ICD-10-CM

## 2024-09-03 DIAGNOSIS — E782 Mixed hyperlipidemia: Secondary | ICD-10-CM | POA: Diagnosis not present

## 2024-09-03 DIAGNOSIS — N1831 Chronic kidney disease, stage 3a: Secondary | ICD-10-CM | POA: Diagnosis not present

## 2024-09-03 DIAGNOSIS — J45998 Other asthma: Secondary | ICD-10-CM | POA: Diagnosis not present

## 2024-09-03 DIAGNOSIS — G8929 Other chronic pain: Secondary | ICD-10-CM | POA: Diagnosis not present

## 2024-09-03 DIAGNOSIS — T85192A Other mechanical complication of implanted electronic neurostimulator (electrode) of spinal cord, initial encounter: Secondary | ICD-10-CM | POA: Diagnosis not present

## 2024-09-03 DIAGNOSIS — F418 Other specified anxiety disorders: Secondary | ICD-10-CM

## 2024-09-03 DIAGNOSIS — R0902 Hypoxemia: Secondary | ICD-10-CM | POA: Diagnosis not present

## 2024-09-03 MED ORDER — ALPRAZOLAM 0.25 MG PO TABS: 0.2500 mg | ORAL_TABLET | Freq: Three times a day (TID) | ORAL | 1 refills | Status: DC | PRN

## 2024-09-03 MED ORDER — EMPAGLIFLOZIN 25 MG PO TABS: 25.0000 mg | ORAL_TABLET | Freq: Every day | ORAL | 1 refills | Status: AC

## 2024-09-03 MED ORDER — NALOXONE HCL 4 MG/0.1ML NA LIQD: NASAL | 0 refills | Status: AC

## 2024-09-03 MED ORDER — NYSTATIN 100000 UNIT/GM EX CREA: 1.0000 | TOPICAL_CREAM | Freq: Two times a day (BID) | CUTANEOUS | 0 refills | Status: AC

## 2024-09-03 MED ORDER — OXYCODONE HCL 20 MG PO TABS
20.0000 mg | ORAL_TABLET | Freq: Four times a day (QID) | ORAL | 0 refills | Status: DC
Start: 2024-09-03 — End: 2024-10-03

## 2024-09-03 NOTE — Progress Notes (Signed)
 Established Patient Office Visit  Subjective:  Patient ID: Lindsey [name], female    DOB: 10-Feb-1961  Age: 63 y.o. MRN: [id number]  Chief Complaint  Patient presents with   Pain Management    Here for pain management follow up. Chronic pain well controlled on current analgesia. Last drug screen satisfactory and pill counts have also been satisfactory. C/o inframammary yeast infection.     No other concerns at this time.   Past Medical History:  Diagnosis Date   Allergy    Anemia    Arthritis    knees   CHF (congestive heart failure) (HCC)    COPD (chronic obstructive pulmonary disease) (HCC)    Diabetes mellitus without complication (HCC)    diet controlled   GERD (gastroesophageal reflux disease)    Heart attack (HCC) 2002   Hyperlipidemia    Hypertension    PUD (peptic ulcer disease)    Sleep apnea    can't tolerate CPAP.  Uses O2 only   Spinal cord stimulator status    for lower back pain   Stroke [facility]) 2004   mini-stroke   Vertigo    Wears dentures    full upper and lower    Past Surgical History:  Procedure Laterality Date   ABDOMINAL HYSTERECTOMY     back surgery     broken wrist     COLONOSCOPY WITH PROPOFOL  N/A 01/30/2017   Procedure: COLONOSCOPY WITH PROPOFOL ;  Surgeon: Ruel Kung, MD;  Location: [facility] ENDOSCOPY;  Service: Endoscopy;  Laterality: N/A;   COLONOSCOPY WITH PROPOFOL  N/A 02/20/2017   Procedure: Colonoscopy with propofol  ;  Surgeon: Ruel Kung, MD;  Location: [facility] ENDOSCOPY;  Service: Endoscopy;  Laterality: N/A;   COLONOSCOPY WITH PROPOFOL  N/A 03/13/2017   Procedure: COLONOSCOPY WITH PROPOFOL ;  Surgeon: Ruel Kung, MD;  Location: [facility] ENDOSCOPY;  Service: Endoscopy;  Laterality: N/A;   COLONOSCOPY WITH PROPOFOL  N/A 04/17/2017   Procedure: COLONOSCOPY WITH PROPOFOL ;  Surgeon: Kung Ruel, MD;  Location: [facility] ENDOSCOPY;  Service: Endoscopy;  Laterality: N/A;   ESOPHAGOGASTRODUODENOSCOPY N/A 05/05/2020   Procedure: ESOPHAGOGASTRODUODENOSCOPY  (EGD);  Surgeon: Toledo, Ladell POUR, MD;  Location: [facility] ENDOSCOPY;  Service: Gastroenterology;  Laterality: N/A;   ESOPHAGOGASTRODUODENOSCOPY (EGD) WITH PROPOFOL  N/A 09/28/2016   Procedure: ESOPHAGOGASTRODUODENOSCOPY (EGD) WITH PROPOFOL ;  Surgeon: Lamar ONEIDA Holmes, MD;  Location: [facility] ENDOSCOPY;  Service: Endoscopy;  Laterality: N/A;   ESOPHAGOGASTRODUODENOSCOPY (EGD) WITH PROPOFOL  N/A 03/13/2017   Procedure: ESOPHAGOGASTRODUODENOSCOPY (EGD) WITH PROPOFOL ;  Surgeon: Ruel Kung, MD;  Location: [facility] ENDOSCOPY;  Service: Endoscopy;  Laterality: N/A;   ESOPHAGOGASTRODUODENOSCOPY (EGD) WITH PROPOFOL  N/A 01/07/2024   Procedure: ESOPHAGOGASTRODUODENOSCOPY (EGD) WITH PROPOFOL ;  Surgeon: Jinny Carmine, MD;  Location: [facility] ENDOSCOPY;  Service: Endoscopy;  Laterality: N/A;   GALLBLADDER SURGERY     GASTRIC BYPASS     GIVENS CAPSULE STUDY N/A 03/13/2017   Procedure: GIVENS CAPSULE STUDY;  Surgeon: Ruel Kung, MD;  Location: [facility] ENDOSCOPY;  Service: Endoscopy;  Laterality: N/A;   HERNIA REPAIR     JOINT REPLACEMENT  1995   REPLACEMENT TOTAL KNEE     ROTATOR CUFF REPAIR      Social History   Socioeconomic History   Marital status: Widowed    Spouse name: Not on file   Number of children: Not on file   Years of education: Not on file   Highest education level: Not on file  Occupational History   Occupation: disabled  Tobacco Use   Smoking status: Never   Smokeless tobacco: Never  Vaping Use  Vaping status: Never Used  Substance and Sexual Activity   Alcohol use: No    Alcohol/week: 0.0 standard drinks of alcohol   Drug use: No   Sexual activity: Yes  Other Topics Concern   Not on file  Social History Narrative   Not on file   Social Drivers of Health   Financial Resource Strain: Low Risk  (07/20/2022)   Received from [facility]   Overall Financial Resource Strain (CARDIA)    Difficulty of Paying Living Expenses: Not hard at all  Food Insecurity: No Food Insecurity (07/20/2022)    Received from [facility]   Hunger Vital Sign    Within the past 12 months, you worried that your food would run out before you got the money to buy more.: Never true    Within the past 12 months, the food you bought just didn't last and you didn't have money to get more.: Never true  Transportation Needs: No Transportation Needs (07/20/2022)   Received from [facility]   PRAPARE - Transportation    Lack of Transportation (Medical): No    Lack of Transportation (Non-Medical): No  Physical Activity: Not on file  Stress: Not on file  Social Connections: Not on file  Intimate Partner Violence: Not on file    Family History  Problem Relation Age of Onset   Hypertension Father    COPD Father    Heart disease Father    Breast cancer Neg Hx     Allergies  Allergen Reactions   Morphine  Hives   Iodinated Contrast Media Hives   Aspirin Hives    Noted on MD progress notes and discussed with MD 09/02/15   Etodolac Hives   Ibuprofen Hives   Shellfish Allergy Hives   Tylenol  [Acetaminophen ] Hives    Upset stomach     Outpatient Medications Prior to Visit  Medication Sig   albuterol  (VENTOLIN  HFA) 108 (90 Base) MCG/ACT inhaler Inhale 2 puffs into the lungs every 4 (four) hours as needed for wheezing or shortness of breath.   apixaban  (ELIQUIS ) 5 MG TABS tablet Take 1 tablet (5 mg total) by mouth 2 (two) times daily.   atorvastatin  (LIPITOR) 40 MG tablet TAKE 1 TABLET BY MOUTH AT BEDTIME   Azelastine  HCl 137 MCG/SPRAY SOLN Place 1 spray into the nose daily.   baclofen  (LIORESAL ) 10 MG tablet TAKE 1 TABLET BY MOUTH 3 TIMES A DAY   Calcium  Carbonate-Vitamin D  (OYSTER SHELL CALCIUM  500 + D PO) Take 1 tablet by mouth daily.   cetirizine  (ZYRTEC  ALLERGY) 10 MG tablet Take 1 tablet (10 mg total) by mouth daily.   cloNIDine  (CATAPRES ) 0.1 MG tablet TAKE 1 TABLET BY MOUTH AT BEDTIME   Daridorexant  HCl (QUVIVIQ ) 25 MG TABS Take 25 mg by mouth at bedtime. Take one tablet by mouth once daily  at bedtime (Patient taking differently: Take 50 mg by mouth at bedtime. Take one tablet by mouth once daily at bedtime)   diclofenac  Sodium (VOLTAREN ) 1 % GEL APPLY 4 GRAMS TOPICALLY TO NECK TWICE DAILY   ferrous sulfate  (FEROSUL) 325 (65 FE) MG tablet TAKE 1 TABLET BY MOUTH 2 TIMES A DAY   fluticasone  (FLONASE ) 50 MCG/ACT nasal spray Place 1 spray into both nostrils daily.   folic acid  (FOLVITE ) 1 MG tablet TAKE 1 TABLET BY MOUTH EVERY DAY   hydrOXYzine  (ATARAX ) 25 MG tablet Take 1 tablet (25 mg total) by mouth 3 (three) times daily.   Insulin  Pen Needle (B-D  UF III MINI PEN NEEDLES) 31G X 5 MM MISC USE WITH DEVICE TO INJECT LEVEMIR  NIGHTLY   meclizine  (ANTIVERT ) 25 MG tablet TAKE 1 TABLET BY MOUTH 3 TIMES A DAY   mirtazapine  (REMERON ) 45 MG tablet Take 1 tablet (45 mg total) by mouth at bedtime.   MOUNJARO  10 MG/0.5ML Pen INJECT 10 MG UNDER THE SKIN ONCE WEEKLY   naloxone  (NARCAN ) nasal spray 4 mg/0.1 mL FOR SUSPECTED OPIOID OVERDOSE, ADMINISTER 1 SPRAY INTO ONE NOSTRIL. REPEAT IN OTHER NOSTRIL USING A NEW DEVICE AFTER 2-3 MINUTES IF NO OR MINIMAL RESPONSE. CALL 911 IF USED   NEXIUM  40 MG capsule Take 1 capsule (40 mg total) by mouth 2 (two) times daily.   oxybutynin  (DITROPAN ) 5 MG tablet Take 1 tablet (5 mg total) by mouth 2 (two) times daily.   polyethylene glycol (MIRALAX  / GLYCOLAX ) 17 g packet Take 17 g by mouth daily as needed for mild constipation.   potassium chloride  (KLOR-CON ) 10 MEQ tablet Take 1 tablet (10 mEq total) by mouth daily.   TRESIBA  FLEXTOUCH 200 UNIT/ML FlexTouch Pen Inject 28 Units into the skin every morning.   UBRELVY  100 MG TABS Take 1 tablet (100 mg total) by mouth as needed.   [DISCONTINUED] ALPRAZolam  (XANAX ) 0.25 MG tablet Take 1 tablet (0.25 mg total) by mouth 3 (three) times daily as needed for anxiety.   [DISCONTINUED] JARDIANCE  25 MG TABS tablet TAKE 1 TABLET BY MOUTH DAILY   [DISCONTINUED] Oxycodone  HCl 20 MG TABS Take 1 tablet (20 mg total) by mouth in the  morning, at noon, in the evening, and at bedtime. As needed for pain   glucose blood (ONETOUCH VERIO) test strip TEST TWICE A DAY   No facility-administered medications prior to visit.    Review of Systems  HENT:  Negative for congestion.   Eyes: Negative.   Respiratory:  Negative for cough.   Cardiovascular: Negative.   Gastrointestinal: Negative.   Genitourinary: Negative.   Musculoskeletal:  Positive for back pain.  Skin: Negative.   Neurological: Negative.   Endo/Heme/Allergies: Negative.   Psychiatric/Behavioral:  The patient has insomnia.        Objective:   BP 128/84   Pulse (!) 108   Temp (!) 97.5 F (36.4 C) (Tympanic)   Ht 5' 7 (1.702 m)   Wt 252 lb (114.3 kg) Comment: CGM - 188  SpO2 98%   BMI 39.47 kg/m   Vitals:   09/03/24 0907  BP: 128/84  Pulse: (!) 108  Temp: (!) 97.5 F (36.4 C)  Height: 5' 7 (1.702 m)  Weight: 252 lb (114.3 kg) Comment: CGM - 188  SpO2: 98%  TempSrc: Tympanic  BMI (Calculated): 39.46    Physical Exam Vitals reviewed.  Constitutional:      General: She is not in acute distress.    Appearance: She is obese.  HENT:     Head: Normocephalic.     Nose: Nose normal.     Mouth/Throat:     Mouth: Mucous membranes are moist.  Eyes:     Extraocular Movements: Extraocular movements intact.     Pupils: Pupils are equal, round, and reactive to light.  Cardiovascular:     Rate and Rhythm: Normal rate and regular rhythm.     Heart sounds: No murmur heard. Pulmonary:     Effort: Pulmonary effort is normal.     Breath sounds: No rhonchi or rales.  Abdominal:     General: Abdomen is flat.     Palpations:  There is no hepatomegaly, splenomegaly or mass.  Musculoskeletal:        General: Normal range of motion.     Cervical back: Normal range of motion. No tenderness.  Skin:    General: Skin is warm and dry.  Neurological:     General: No focal deficit present.     Mental Status: She is alert and oriented to person, place,  and time.     Cranial Nerves: No cranial nerve deficit.     Motor: No weakness.  Psychiatric:        Mood and Affect: Mood normal.        Behavior: Behavior normal.      No results found for any visits on 09/03/24.      Assessment & Plan:  Lutricia was seen today for pain management.  Malfunction of spinal cord stimulator, initial encounter -     Ambulatory referral to Pain Clinic  Other chronic pain -     oxyCODONE  HCl; Take 1 tablet (20 mg total) by mouth in the morning, at noon, in the evening, and at bedtime. As needed for pain  Dispense: 120 tablet; Refill: 0  Depression with anxiety -     ALPRAZolam ; Take 1 tablet (0.25 mg total) by mouth 3 (three) times daily as needed for anxiety.  Dispense: 90 tablet; Refill: 1  Type 2 diabetes mellitus with stage 3a chronic kidney disease, with long-term current use of insulin  (HCC) -     Empagliflozin ; Take 1 tablet (25 mg total) by mouth daily.  Dispense: 90 tablet; Refill: 1 -     Hemoglobin A1c  Mixed hyperlipidemia -     Lipid panel -     Comprehensive metabolic panel with GFR    Problem List Items Addressed This Visit       Endocrine   Type II diabetes mellitus with renal manifestations (HCC)   Relevant Medications   empagliflozin  (JARDIANCE ) 25 MG TABS tablet   Other Relevant Orders   Hemoglobin A1c     Other   Other chronic pain   Relevant Medications   Oxycodone  HCl 20 MG TABS   HLD (hyperlipidemia)   Relevant Orders   Lipid panel   Comprehensive metabolic panel with GFR   Depression with anxiety   Relevant Medications   ALPRAZolam  (XANAX ) 0.25 MG tablet   Other Visit Diagnoses       Malfunction of spinal cord stimulator, initial encounter    -  Primary   Relevant Orders   Ambulatory referral to Pain Clinic       Return in about 1 week (around 09/10/2024) for lab results.   Total time spent: 20 minutes  Sherrill Cinderella Perry, MD  09/03/2024   This document may have been prepared by [facility]  Voice Recognition software and as such may include unintentional dictation errors.

## 2024-09-04 LAB — COMPREHENSIVE METABOLIC PANEL WITH GFR
ALT: 71 IU/L — ABNORMAL HIGH (ref 0–32)
AST: 36 IU/L (ref 0–40)
Albumin: 3.9 g/dL (ref 3.9–4.9)
Alkaline Phosphatase: 198 IU/L — ABNORMAL HIGH (ref 49–135)
BUN/Creatinine Ratio: 16 (ref 12–28)
BUN: 18 mg/dL (ref 8–27)
Bilirubin Total: 0.5 mg/dL (ref 0.0–1.2)
CO2: 19 mmol/L — ABNORMAL LOW (ref 20–29)
Calcium: 8.9 mg/dL (ref 8.7–10.3)
Chloride: 110 mmol/L — ABNORMAL HIGH (ref 96–106)
Creatinine, Ser: 1.13 mg/dL — ABNORMAL HIGH (ref 0.57–1.00)
Globulin, Total: 2.6 g/dL (ref 1.5–4.5)
Glucose: 162 mg/dL — ABNORMAL HIGH (ref 70–99)
Potassium: 4.9 mmol/L (ref 3.5–5.2)
Sodium: 143 mmol/L (ref 134–144)
Total Protein: 6.5 g/dL (ref 6.0–8.5)
eGFR: 54 mL/min/1.73 — ABNORMAL LOW (ref >59)

## 2024-09-04 LAB — LIPID PANEL
Chol/HDL Ratio: 1.8 ratio (ref 0.0–4.4)
Cholesterol, Total: 154 mg/dL (ref 100–199)
HDL: 84 mg/dL (ref >39)
LDL Chol Calc (NIH): 59 mg/dL (ref 0–99)
Triglycerides: 51 mg/dL (ref 0–149)
VLDL Cholesterol Cal: 11 mg/dL (ref 5–40)

## 2024-09-04 LAB — HEMOGLOBIN A1C
Est. average glucose Bld gHb Est-mCnc: 146 mg/dL
Hgb A1c MFr Bld: 6.7 % — ABNORMAL HIGH (ref 4.8–5.6)

## 2024-09-08 NOTE — Progress Notes (Addendum)
 Mercy Leppla                                          MRN: [id number]   09/08/2024   The [facility] Quality Team Specialist reviewed this patient medical record for the purposes of chart review for care gap closure. The following were reviewed: chart review for care gap closure-kidney health evaluation for diabetes:eGFR  and uACR. Checked ked measure again. ABSTRACTED REVIEW FOR GSD MEASURES    [facility] Quality Team

## 2024-09-17 ENCOUNTER — Encounter: Payer: Self-pay | Admitting: Internal Medicine

## 2024-09-17 ENCOUNTER — Ambulatory Visit: Admitting: Internal Medicine

## 2024-09-17 ENCOUNTER — Ambulatory Visit: Payer: Self-pay | Admitting: Internal Medicine

## 2024-09-17 VITALS — BP 120/82 | HR 114 | Temp 96.5°F | Ht 67.0 in | Wt 252.6 lb

## 2024-09-17 DIAGNOSIS — N1831 Chronic kidney disease, stage 3a: Secondary | ICD-10-CM

## 2024-09-17 DIAGNOSIS — J301 Allergic rhinitis due to pollen: Secondary | ICD-10-CM

## 2024-09-17 DIAGNOSIS — Z794 Long term (current) use of insulin: Secondary | ICD-10-CM | POA: Diagnosis not present

## 2024-09-17 DIAGNOSIS — E1122 Type 2 diabetes mellitus with diabetic chronic kidney disease: Secondary | ICD-10-CM

## 2024-09-17 DIAGNOSIS — I10 Essential (primary) hypertension: Secondary | ICD-10-CM

## 2024-09-17 DIAGNOSIS — K760 Fatty (change of) liver, not elsewhere classified: Secondary | ICD-10-CM | POA: Diagnosis not present

## 2024-09-17 LAB — POCT CBG (FASTING - GLUCOSE)-MANUAL ENTRY: Glucose Fasting, POC: 226 mg/dL — AB (ref 70–99)

## 2024-09-17 MED ORDER — POTASSIUM CHLORIDE ER 10 MEQ PO TBCR: 10.0000 meq | EXTENDED_RELEASE_TABLET | Freq: Every day | ORAL | 1 refills | Status: DC

## 2024-09-17 MED ORDER — CETIRIZINE HCL 10 MG PO TABS: 10.0000 mg | ORAL_TABLET | Freq: Every day | ORAL | 2 refills | Status: DC

## 2024-09-17 MED ORDER — AZELASTINE HCL 137 MCG/SPRAY NA SOLN: 1.0000 | Freq: Every day | NASAL | 2 refills | Status: DC

## 2024-09-17 NOTE — Progress Notes (Signed)
 Established Patient Office Visit  Subjective:  Patient ID: Lindsey [name], female    DOB: 26-Nov-1961  Age: 63 y.o. MRN: [id number]  Chief Complaint  Patient presents with  . Follow-up    Lab follow up    No new complaints, here for lab review and medication refills. Labs reviewed and notable for well controlled diabetes, A1c higher but still at target, lipids at target with cmp notable for stable ckd but elevated ALT. Denies any hypoglycemic episodes and home bg readings have been at target.     No other concerns at this time.   Past Medical History:  Diagnosis Date  . Allergy   . Anemia   . Arthritis    knees  . CHF (congestive heart failure) (HCC)   . COPD (chronic obstructive pulmonary disease) (HCC)   . Diabetes mellitus without complication (HCC)    diet controlled  . GERD (gastroesophageal reflux disease)   . Heart attack (HCC) 2002  . Hyperlipidemia   . Hypertension   . PUD (peptic ulcer disease)   . Sleep apnea    can't tolerate CPAP.  Uses O2 only  . Spinal cord stimulator status    for lower back pain  . Stroke [facility]) 2004   mini-stroke  . Vertigo   . Wears dentures    full upper and lower    Past Surgical History:  Procedure Laterality Date  . ABDOMINAL HYSTERECTOMY    . back surgery    . broken wrist    . COLONOSCOPY WITH PROPOFOL  N/A 01/30/2017   Procedure: COLONOSCOPY WITH PROPOFOL ;  Surgeon: Ruel Kung, MD;  Location: [facility] ENDOSCOPY;  Service: Endoscopy;  Laterality: N/A;  . COLONOSCOPY WITH PROPOFOL  N/A 02/20/2017   Procedure: Colonoscopy with propofol  ;  Surgeon: Ruel Kung, MD;  Location: [facility] ENDOSCOPY;  Service: Endoscopy;  Laterality: N/A;  . COLONOSCOPY WITH PROPOFOL  N/A 03/13/2017   Procedure: COLONOSCOPY WITH PROPOFOL ;  Surgeon: Ruel Kung, MD;  Location: [facility] ENDOSCOPY;  Service: Endoscopy;  Laterality: N/A;  . COLONOSCOPY WITH PROPOFOL  N/A 04/17/2017   Procedure: COLONOSCOPY WITH PROPOFOL ;  Surgeon: Kung Ruel, MD;  Location: [facility]  ENDOSCOPY;  Service: Endoscopy;  Laterality: N/A;  . ESOPHAGOGASTRODUODENOSCOPY N/A 05/05/2020   Procedure: ESOPHAGOGASTRODUODENOSCOPY (EGD);  Surgeon: Toledo, Ladell POUR, MD;  Location: [facility] ENDOSCOPY;  Service: Gastroenterology;  Laterality: N/A;  . ESOPHAGOGASTRODUODENOSCOPY (EGD) WITH PROPOFOL  N/A 09/28/2016   Procedure: ESOPHAGOGASTRODUODENOSCOPY (EGD) WITH PROPOFOL ;  Surgeon: Lamar ONEIDA Holmes, MD;  Location: [facility] ENDOSCOPY;  Service: Endoscopy;  Laterality: N/A;  . ESOPHAGOGASTRODUODENOSCOPY (EGD) WITH PROPOFOL  N/A 03/13/2017   Procedure: ESOPHAGOGASTRODUODENOSCOPY (EGD) WITH PROPOFOL ;  Surgeon: Ruel Kung, MD;  Location: [facility] ENDOSCOPY;  Service: Endoscopy;  Laterality: N/A;  . ESOPHAGOGASTRODUODENOSCOPY (EGD) WITH PROPOFOL  N/A 01/07/2024   Procedure: ESOPHAGOGASTRODUODENOSCOPY (EGD) WITH PROPOFOL ;  Surgeon: Jinny Carmine, MD;  Location: [facility] ENDOSCOPY;  Service: Endoscopy;  Laterality: N/A;  . GALLBLADDER SURGERY    . GASTRIC BYPASS    . GIVENS CAPSULE STUDY N/A 03/13/2017   Procedure: GIVENS CAPSULE STUDY;  Surgeon: Ruel Kung, MD;  Location: [facility] ENDOSCOPY;  Service: Endoscopy;  Laterality: N/A;  . HERNIA REPAIR    . JOINT REPLACEMENT  1995  . REPLACEMENT TOTAL KNEE    . ROTATOR CUFF REPAIR      Social History   Socioeconomic History  . Marital status: Widowed    Spouse name: Not on file  . Number of children: Not on file  . Years of education: Not on file  . Highest education level: Not  on file  Occupational History  . Occupation: disabled  Tobacco Use  . Smoking status: Never  . Smokeless tobacco: Never  Vaping Use  . Vaping status: Never Used  Substance and Sexual Activity  . Alcohol use: No    Alcohol/week: 0.0 standard drinks of alcohol  . Drug use: No  . Sexual activity: Yes  Other Topics Concern  . Not on file  Social History Narrative  . Not on file   Social Drivers of Health   Financial Resource Strain: Low Risk  (07/20/2022)   Received from [facility]    Overall Financial Resource Strain (CARDIA)   . Difficulty of Paying Living Expenses: Not hard at all  Food Insecurity: No Food Insecurity (07/20/2022)   Received from [facility]   Hunger Vital Sign   . Within the past 12 months, you worried that your food would run out before you got the money to buy more.: Never true   . Within the past 12 months, the food you bought just didn't last and you didn't have money to get more.: Never true  Transportation Needs: No Transportation Needs (07/20/2022)   Received from [facility]   [facility] - Transportation   . Lack of Transportation (Medical): No   . Lack of Transportation (Non-Medical): No  Physical Activity: Not on file  Stress: Not on file  Social Connections: Not on file  Intimate Partner Violence: Not on file    Family History  Problem Relation Age of Onset  . Hypertension Father   . COPD Father   . Heart disease Father   . Breast cancer Neg Hx     Allergies  Allergen Reactions  . Morphine  Hives  . Iodinated Contrast Media Hives  . Aspirin Hives    Noted on MD progress notes and discussed with MD 09/02/15  . Etodolac Hives  . Ibuprofen Hives  . Shellfish Allergy Hives  . Tylenol  [Acetaminophen ] Hives    Upset stomach     Outpatient Medications Prior to Visit  Medication Sig  . albuterol  (VENTOLIN  HFA) 108 (90 Base) MCG/ACT inhaler Inhale 2 puffs into the lungs every 4 (four) hours as needed for wheezing or shortness of breath.  . ALPRAZolam  (XANAX ) 0.25 MG tablet Take 1 tablet (0.25 mg total) by mouth 3 (three) times daily as needed for anxiety.  . apixaban  (ELIQUIS ) 5 MG TABS tablet Take 1 tablet (5 mg total) by mouth 2 (two) times daily.  . atorvastatin  (LIPITOR) 40 MG tablet TAKE 1 TABLET BY MOUTH AT BEDTIME  . Azelastine  HCl 137 MCG/SPRAY SOLN Place 1 spray into the nose daily.  . baclofen  (LIORESAL ) 10 MG tablet TAKE 1 TABLET BY MOUTH 3 TIMES A DAY  . Calcium  Carbonate-Vitamin D  (OYSTER SHELL CALCIUM  500 + D PO)  Take 1 tablet by mouth daily.  . cetirizine  (ZYRTEC  ALLERGY) 10 MG tablet Take 1 tablet (10 mg total) by mouth daily.  . cloNIDine  (CATAPRES ) 0.1 MG tablet TAKE 1 TABLET BY MOUTH AT BEDTIME  . Daridorexant  HCl (QUVIVIQ ) 25 MG TABS Take 25 mg by mouth at bedtime. Take one tablet by mouth once daily at bedtime (Patient taking differently: Take 50 mg by mouth at bedtime. Take one tablet by mouth once daily at bedtime)  . diclofenac  Sodium (VOLTAREN ) 1 % GEL APPLY 4 GRAMS TOPICALLY TO NECK TWICE DAILY  . empagliflozin  (JARDIANCE ) 25 MG TABS tablet Take 1 tablet (25 mg total) by mouth daily.  . ferrous sulfate  (FEROSUL) 325 (65  FE) MG tablet TAKE 1 TABLET BY MOUTH 2 TIMES A DAY  . fluticasone  (FLONASE ) 50 MCG/ACT nasal spray Place 1 spray into both nostrils daily.  . folic acid  (FOLVITE ) 1 MG tablet TAKE 1 TABLET BY MOUTH EVERY DAY  . glucose blood (ONETOUCH VERIO) test strip TEST TWICE A DAY  . hydrOXYzine  (ATARAX ) 25 MG tablet Take 1 tablet (25 mg total) by mouth 3 (three) times daily.  . Insulin  Pen Needle (B-D UF III MINI PEN NEEDLES) 31G X 5 MM MISC USE WITH DEVICE TO INJECT LEVEMIR  NIGHTLY  . meclizine  (ANTIVERT ) 25 MG tablet TAKE 1 TABLET BY MOUTH 3 TIMES A DAY  . mirtazapine  (REMERON ) 45 MG tablet Take 1 tablet (45 mg total) by mouth at bedtime.  . MOUNJARO  10 MG/0.5ML Pen INJECT 10 MG UNDER THE SKIN ONCE WEEKLY  . naloxone  (NARCAN ) nasal spray 4 mg/0.1 mL FOR SUSPECTED OPIOID OVERDOSE, ADMINISTER 1 SPRAY INTO ONE NOSTRIL. REPEAT IN OTHER NOSTRIL USING A NEW DEVICE AFTER 2-3 MINUTES IF NO OR MINIMAL RESPONSE. CALL 911 IF USED  . NEXIUM  40 MG capsule Take 1 capsule (40 mg total) by mouth 2 (two) times daily.  . nystatin  cream (MYCOSTATIN ) Apply 1 Application topically 2 (two) times daily.  . oxybutynin  (DITROPAN ) 5 MG tablet Take 1 tablet (5 mg total) by mouth 2 (two) times daily.  . Oxycodone  HCl 20 MG TABS Take 1 tablet (20 mg total) by mouth in the morning, at noon, in the evening, and at  bedtime. As needed for pain  . polyethylene glycol (MIRALAX  / GLYCOLAX ) 17 g packet Take 17 g by mouth daily as needed for mild constipation.  . potassium chloride  (KLOR-CON ) 10 MEQ tablet Take 1 tablet (10 mEq total) by mouth daily.  . TRESIBA  FLEXTOUCH 200 UNIT/ML FlexTouch Pen Inject 28 Units into the skin every morning.  . UBRELVY  100 MG TABS Take 1 tablet (100 mg total) by mouth as needed.   No facility-administered medications prior to visit.    Review of Systems  HENT:  Negative for congestion.   Eyes: Negative.   Respiratory:  Negative for cough.   Cardiovascular: Negative.   Gastrointestinal: Negative.   Genitourinary: Negative.   Musculoskeletal:  Positive for back pain.  Skin: Negative.   Neurological: Negative.   Endo/Heme/Allergies: Negative.   Psychiatric/Behavioral:  The patient has insomnia.        Objective:   BP 120/82   Pulse (!) 114   Temp (!) 96.5 F (35.8 C)   Ht 5' 7 (1.702 m)   Wt 252 lb 9.6 oz (114.6 kg)   SpO2 97%   BMI 39.56 kg/m   Vitals:   09/17/24 0916  BP: 120/82  Pulse: (!) 114  Temp: (!) 96.5 F (35.8 C)  Height: 5' 7 (1.702 m)  Weight: 252 lb 9.6 oz (114.6 kg)  SpO2: 97%  BMI (Calculated): 39.55    Physical Exam Vitals reviewed.  Constitutional:      General: She is not in acute distress.    Appearance: She is obese.  HENT:     Head: Normocephalic.     Nose: Nose normal.     Mouth/Throat:     Mouth: Mucous membranes are moist.  Eyes:     Extraocular Movements: Extraocular movements intact.     Pupils: Pupils are equal, round, and reactive to light.  Cardiovascular:     Rate and Rhythm: Normal rate and regular rhythm.     Heart sounds: No murmur heard. Pulmonary:  Effort: Pulmonary effort is normal.     Breath sounds: No rhonchi or rales.  Abdominal:     General: Abdomen is flat.     Palpations: There is no hepatomegaly, splenomegaly or mass.  Musculoskeletal:        General: Normal range of motion.      Cervical back: Normal range of motion. No tenderness.  Skin:    General: Skin is warm and dry.  Neurological:     General: No focal deficit present.     Mental Status: She is alert and oriented to person, place, and time.     Cranial Nerves: No cranial nerve deficit.     Motor: No weakness.  Psychiatric:        Mood and Affect: Mood normal.        Behavior: Behavior normal.      Results for orders placed or performed in visit on 09/17/24  POCT CBG (Fasting - Glucose)  Result Value Ref Range   Glucose Fasting, POC 226 (A) 70 - 99 mg/dL    CMP     Component Value Date/Time   NA 143 09/03/2024 0931   NA 145 07/01/2014 0513   K 4.9 09/03/2024 0931   K 5.1 07/01/2014 0513   CL 110 (H) 09/03/2024 0931   CL 119 (H) 07/01/2014 0513   CO2 19 (L) 09/03/2024 0931   CO2 16 (L) 07/01/2014 0513   GLUCOSE 162 (H) 09/03/2024 0931   GLUCOSE 124 (H) 08/01/2024 0108   GLUCOSE 126 (H) 07/01/2014 0513   BUN 18 09/03/2024 0931   BUN 18 07/01/2014 0513   CREATININE 1.13 (H) 09/03/2024 0931   CREATININE 1.22 07/01/2014 0513   CALCIUM  8.9 09/03/2024 0931   CALCIUM  8.7 07/01/2014 0513   PROT 6.5 09/03/2024 0931   PROT 7.0 07/01/2014 0513   ALBUMIN  3.9 09/03/2024 0931   ALBUMIN  2.5 (L) 07/01/2014 0513   AST 36 09/03/2024 0931   AST 48 (H) 07/01/2014 0513   ALT 71 (H) 09/03/2024 0931   ALT 22 07/01/2014 0513   ALKPHOS 198 (H) 09/03/2024 0931   ALKPHOS 107 07/01/2014 0513   BILITOT 0.5 09/03/2024 0931   BILITOT 0.7 07/01/2014 0513   EGFR 54 (L) 09/03/2024 0931   GFRNONAA >60 08/01/2024 0108   GFRNONAA 50 (L) 07/01/2014 0513   Lipid Panel     Component Value Date/Time   CHOL 154 09/03/2024 0931   TRIG 51 09/03/2024 0931   HDL 84 09/03/2024 0931   CHOLHDL 1.8 09/03/2024 0931   CHOLHDL 1.9 09/01/2015 0932   VLDL 9 09/01/2015 0932   LDLCALC 59 09/03/2024 0931   LABVLDL 11 09/03/2024 0931   Lab Results  Component Value Date   HGBA1C 6.7 (H) 09/03/2024   HGBA1C 6.1 (H) 02/20/2024    HGBA1C 5.7 (H) 02/04/2024       Assessment & Plan:  Penelopi was seen today for follow-up.  Type 2 diabetes mellitus with stage 3a chronic kidney disease, with long-term current use of insulin  (HCC) -     POCT CBG (Fasting - Glucose)  Primary hypertension  Fatty liver    Problem List Items Addressed This Visit       Cardiovascular and Mediastinum   HTN (hypertension)     Endocrine   Type II diabetes mellitus with renal manifestations (HCC) - Primary   Relevant Orders   POCT CBG (Fasting - Glucose) (Completed)   Other Visit Diagnoses       Fatty liver  No follow-ups on file.   Total time spent: 30 minutes. This time includes review of previous notes and results and patient face to face interaction during today'Courtney Fenlon visit.    Sherrill Cinderella Perry, MD  09/17/2024   This document may have been prepared by [facility] Voice Recognition software and as such may include unintentional dictation errors.

## 2024-09-18 LAB — FIB-4 W/RX NASH FIBROSURE PLUS
ALT: 21 IU/L (ref 0–32)
AST: 23 IU/L (ref 0–40)
FIB-4 Index: 0.94 (ref 0.00–2.67)
Platelets: 347 x10E3/uL (ref 150–450)

## 2024-09-19 ENCOUNTER — Other Ambulatory Visit: Payer: Self-pay | Admitting: Internal Medicine

## 2024-09-19 DIAGNOSIS — D500 Iron deficiency anemia secondary to blood loss (chronic): Secondary | ICD-10-CM

## 2024-09-30 ENCOUNTER — Ambulatory Visit: Attending: Otolaryngology

## 2024-09-30 DIAGNOSIS — G4733 Obstructive sleep apnea (adult) (pediatric): Secondary | ICD-10-CM | POA: Insufficient documentation

## 2024-10-01 ENCOUNTER — Other Ambulatory Visit

## 2024-10-03 ENCOUNTER — Ambulatory Visit: Admitting: Internal Medicine

## 2024-10-03 ENCOUNTER — Ambulatory Visit: Payer: Self-pay | Admitting: Internal Medicine

## 2024-10-03 VITALS — BP 154/84 | HR 108 | Ht 67.0 in | Wt 251.0 lb

## 2024-10-03 DIAGNOSIS — G8929 Other chronic pain: Secondary | ICD-10-CM

## 2024-10-03 DIAGNOSIS — Z131 Encounter for screening for diabetes mellitus: Secondary | ICD-10-CM

## 2024-10-03 DIAGNOSIS — J301 Allergic rhinitis due to pollen: Secondary | ICD-10-CM

## 2024-10-03 DIAGNOSIS — T85192S Other mechanical complication of implanted electronic neurostimulator (electrode) of spinal cord, sequela: Secondary | ICD-10-CM

## 2024-10-03 DIAGNOSIS — I10 Essential (primary) hypertension: Secondary | ICD-10-CM

## 2024-10-03 MED ORDER — FERROUS SULFATE 325 (65 FE) MG PO TABS: 325.0000 mg | ORAL_TABLET | Freq: Two times a day (BID) | ORAL | 2 refills | Status: AC

## 2024-10-03 MED ORDER — BENZONATATE 100 MG PO CAPS: 100.0000 mg | ORAL_CAPSULE | Freq: Three times a day (TID) | ORAL | 0 refills | Status: AC | PRN

## 2024-10-03 MED ORDER — OXYCODONE HCL 20 MG PO TABS: 20.0000 mg | ORAL_TABLET | Freq: Four times a day (QID) | ORAL | 0 refills | Status: DC

## 2024-10-03 NOTE — Progress Notes (Signed)
 Established Patient Office Visit  Subjective:  Patient ID: Lindsey [name], female    DOB: 04/30/1961  Age: 63 y.o. MRN: [id number]  Chief Complaint  Patient presents with  . Pain Management    PM     Here for pain management follow up. Chronic pain well controlled on current analgesia. Last drug screen satisfactory and pill counts have also been satisfactory. Pain management declined to remove stimulator and recommended neurosurgery consult.    No other concerns at this time.   Past Medical History:  Diagnosis Date  . Allergy   . Anemia   . Arthritis    knees  . CHF (congestive heart failure) (HCC)   . COPD (chronic obstructive pulmonary disease) (HCC)   . Diabetes mellitus without complication (HCC)    diet controlled  . GERD (gastroesophageal reflux disease)   . Heart attack (HCC) 2002  . Hyperlipidemia   . Hypertension   . PUD (peptic ulcer disease)   . Sleep apnea    can't tolerate CPAP.  Uses O2 only  . Spinal cord stimulator status    for lower back pain  . Stroke [facility]) 2004   mini-stroke  . Vertigo   . Wears dentures    full upper and lower    Past Surgical History:  Procedure Laterality Date  . ABDOMINAL HYSTERECTOMY    . back surgery    . broken wrist    . COLONOSCOPY WITH PROPOFOL  N/A 01/30/2017   Procedure: COLONOSCOPY WITH PROPOFOL ;  Surgeon: Ruel Kung, MD;  Location: [facility] ENDOSCOPY;  Service: Endoscopy;  Laterality: N/A;  . COLONOSCOPY WITH PROPOFOL  N/A 02/20/2017   Procedure: Colonoscopy with propofol  ;  Surgeon: Ruel Kung, MD;  Location: [facility] ENDOSCOPY;  Service: Endoscopy;  Laterality: N/A;  . COLONOSCOPY WITH PROPOFOL  N/A 03/13/2017   Procedure: COLONOSCOPY WITH PROPOFOL ;  Surgeon: Ruel Kung, MD;  Location: [facility] ENDOSCOPY;  Service: Endoscopy;  Laterality: N/A;  . COLONOSCOPY WITH PROPOFOL  N/A 04/17/2017   Procedure: COLONOSCOPY WITH PROPOFOL ;  Surgeon: Kung Ruel, MD;  Location: [facility] ENDOSCOPY;  Service: Endoscopy;  Laterality: N/A;  .  ESOPHAGOGASTRODUODENOSCOPY N/A 05/05/2020   Procedure: ESOPHAGOGASTRODUODENOSCOPY (EGD);  Surgeon: Toledo, Ladell POUR, MD;  Location: [facility] ENDOSCOPY;  Service: Gastroenterology;  Laterality: N/A;  . ESOPHAGOGASTRODUODENOSCOPY (EGD) WITH PROPOFOL  N/A 09/28/2016   Procedure: ESOPHAGOGASTRODUODENOSCOPY (EGD) WITH PROPOFOL ;  Surgeon: Lamar ONEIDA Holmes, MD;  Location: [facility] ENDOSCOPY;  Service: Endoscopy;  Laterality: N/A;  . ESOPHAGOGASTRODUODENOSCOPY (EGD) WITH PROPOFOL  N/A 03/13/2017   Procedure: ESOPHAGOGASTRODUODENOSCOPY (EGD) WITH PROPOFOL ;  Surgeon: Ruel Kung, MD;  Location: [facility] ENDOSCOPY;  Service: Endoscopy;  Laterality: N/A;  . ESOPHAGOGASTRODUODENOSCOPY (EGD) WITH PROPOFOL  N/A 01/07/2024   Procedure: ESOPHAGOGASTRODUODENOSCOPY (EGD) WITH PROPOFOL ;  Surgeon: Jinny Carmine, MD;  Location: [facility] ENDOSCOPY;  Service: Endoscopy;  Laterality: N/A;  . GALLBLADDER SURGERY    . GASTRIC BYPASS    . GIVENS CAPSULE STUDY N/A 03/13/2017   Procedure: GIVENS CAPSULE STUDY;  Surgeon: Ruel Kung, MD;  Location: [facility] ENDOSCOPY;  Service: Endoscopy;  Laterality: N/A;  . HERNIA REPAIR    . JOINT REPLACEMENT  1995  . REPLACEMENT TOTAL KNEE    . ROTATOR CUFF REPAIR      Social History   Socioeconomic History  . Marital status: Widowed    Spouse name: Not on file  . Number of children: Not on file  . Years of education: Not on file  . Highest education level: Not on file  Occupational History  . Occupation: disabled  Tobacco Use  . Smoking  status: Never  . Smokeless tobacco: Never  Vaping Use  . Vaping status: Never Used  Substance and Sexual Activity  . Alcohol use: No    Alcohol/week: 0.0 standard drinks of alcohol  . Drug use: No  . Sexual activity: Yes  Other Topics Concern  . Not on file  Social History Narrative  . Not on file   Social Drivers of Health   Financial Resource Strain: Low Risk  (07/20/2022)   Received from [facility]   Overall Financial Resource Strain (CARDIA)   .  Difficulty of Paying Living Expenses: Not hard at all  Food Insecurity: No Food Insecurity (07/20/2022)   Received from [facility]   Hunger Vital Sign   . Within the past 12 months, you worried that your food would run out before you got the money to buy more.: Never true   . Within the past 12 months, the food you bought just didn't last and you didn't have money to get more.: Never true  Transportation Needs: No Transportation Needs (07/20/2022)   Received from [facility]   [facility] - Transportation   . Lack of Transportation (Medical): No   . Lack of Transportation (Non-Medical): No  Physical Activity: Not on file  Stress: Not on file  Social Connections: Not on file  Intimate Partner Violence: Not on file    Family History  Problem Relation Age of Onset  . Hypertension Father   . COPD Father   . Heart disease Father   . Breast cancer Neg Hx     Allergies  Allergen Reactions  . Morphine  Hives  . Iodinated Contrast Media Hives  . Aspirin Hives    Noted on MD progress notes and discussed with MD 09/02/15  . Etodolac Hives  . Ibuprofen Hives  . Shellfish Allergy Hives  . Tylenol  [Acetaminophen ] Hives    Upset stomach     Outpatient Medications Prior to Visit  Medication Sig  . albuterol  (VENTOLIN  HFA) 108 (90 Base) MCG/ACT inhaler Inhale 2 puffs into the lungs every 4 (four) hours as needed for wheezing or shortness of breath.  . ALPRAZolam  (XANAX ) 0.25 MG tablet Take 1 tablet (0.25 mg total) by mouth 3 (three) times daily as needed for anxiety.  . apixaban  (ELIQUIS ) 5 MG TABS tablet Take 1 tablet (5 mg total) by mouth 2 (two) times daily.  . atorvastatin  (LIPITOR) 40 MG tablet TAKE 1 TABLET BY MOUTH AT BEDTIME  . Azelastine  HCl 137 MCG/SPRAY SOLN Place 1 spray into the nose daily.  . baclofen  (LIORESAL ) 10 MG tablet TAKE 1 TABLET BY MOUTH 3 TIMES A DAY  . Calcium  Carbonate-Vitamin D  (OYSTER SHELL CALCIUM  500 + D PO) Take 1 tablet by mouth daily.  . cetirizine   (ZYRTEC  ALLERGY) 10 MG tablet Take 1 tablet (10 mg total) by mouth daily.  . cloNIDine  (CATAPRES ) 0.1 MG tablet TAKE 1 TABLET BY MOUTH AT BEDTIME  . Daridorexant  HCl (QUVIVIQ ) 25 MG TABS Take 25 mg by mouth at bedtime. Take one tablet by mouth once daily at bedtime (Patient taking differently: Take 50 mg by mouth at bedtime. Take one tablet by mouth once daily at bedtime)  . diclofenac  Sodium (VOLTAREN ) 1 % GEL APPLY 4 GRAMS TOPICALLY TO NECK TWICE DAILY  . empagliflozin  (JARDIANCE ) 25 MG TABS tablet Take 1 tablet (25 mg total) by mouth daily.  . ferrous sulfate  (FEROSUL) 325 (65 FE) MG tablet TAKE 1 TABLET BY MOUTH 2 TIMES A DAY  . fluticasone  (  FLONASE ) 50 MCG/ACT nasal spray Place 1 spray into both nostrils daily.  . folic acid  (FOLVITE ) 1 MG tablet TAKE 1 TABLET BY MOUTH DAILY  . glucose blood (ONETOUCH VERIO) test strip TEST TWICE A DAY  . hydrOXYzine  (ATARAX ) 25 MG tablet Take 1 tablet (25 mg total) by mouth 3 (three) times daily.  . Insulin  Pen Needle (B-D UF III MINI PEN NEEDLES) 31G X 5 MM MISC USE WITH DEVICE TO INJECT LEVEMIR  NIGHTLY  . meclizine  (ANTIVERT ) 25 MG tablet TAKE 1 TABLET BY MOUTH 3 TIMES A DAY  . mirtazapine  (REMERON ) 45 MG tablet Take 1 tablet (45 mg total) by mouth at bedtime.  . MOUNJARO  10 MG/0.5ML Pen INJECT 10 MG UNDER THE SKIN ONCE WEEKLY  . naloxone  (NARCAN ) nasal spray 4 mg/0.1 mL FOR SUSPECTED OPIOID OVERDOSE, ADMINISTER 1 SPRAY INTO ONE NOSTRIL. REPEAT IN OTHER NOSTRIL USING A NEW DEVICE AFTER 2-3 MINUTES IF NO OR MINIMAL RESPONSE. CALL 911 IF USED  . NEXIUM  40 MG capsule Take 1 capsule (40 mg total) by mouth 2 (two) times daily.  . nystatin  cream (MYCOSTATIN ) Apply 1 Application topically 2 (two) times daily.  . oxybutynin  (DITROPAN ) 5 MG tablet Take 1 tablet (5 mg total) by mouth 2 (two) times daily.  . Oxycodone  HCl 20 MG TABS Take 1 tablet (20 mg total) by mouth in the morning, at noon, in the evening, and at bedtime. As needed for pain  . polyethylene glycol  (MIRALAX  / GLYCOLAX ) 17 g packet Take 17 g by mouth daily as needed for mild constipation.  . potassium chloride  (KLOR-CON ) 10 MEQ tablet Take 1 tablet (10 mEq total) by mouth daily.  . TRESIBA  FLEXTOUCH 200 UNIT/ML FlexTouch Pen Inject 28 Units into the skin every morning.  . UBRELVY  100 MG TABS Take 1 tablet (100 mg total) by mouth as needed.   No facility-administered medications prior to visit.    Review of Systems  HENT:  Negative for congestion.   Eyes: Negative.   Respiratory:  Negative for cough.   Cardiovascular: Negative.   Gastrointestinal: Negative.   Genitourinary: Negative.   Musculoskeletal:  Positive for back pain.  Skin: Negative.   Neurological: Negative.   Endo/Heme/Allergies: Negative.   Psychiatric/Behavioral:  The patient has insomnia.        Objective:   BP (!) 154/84   Pulse (!) 108   Ht 5' 7 (1.702 m)   Wt 251 lb (113.9 kg)   SpO2 98%   BMI 39.31 kg/m   Vitals:   10/03/24 1008  BP: (!) 154/84  Pulse: (!) 108  Height: 5' 7 (1.702 m)  Weight: 251 lb (113.9 kg)  SpO2: 98%  BMI (Calculated): 39.3    Physical Exam Vitals reviewed.  Constitutional:      General: She is not in acute distress.    Appearance: She is obese.  HENT:     Head: Normocephalic.     Nose: Nose normal.     Mouth/Throat:     Mouth: Mucous membranes are moist.  Eyes:     Extraocular Movements: Extraocular movements intact.     Pupils: Pupils are equal, round, and reactive to light.  Cardiovascular:     Rate and Rhythm: Normal rate and regular rhythm.     Heart sounds: No murmur heard. Pulmonary:     Effort: Pulmonary effort is normal.     Breath sounds: No rhonchi or rales.  Abdominal:     General: Abdomen is flat.  Palpations: There is no hepatomegaly, splenomegaly or mass.  Musculoskeletal:        General: Normal range of motion.     Cervical back: Normal range of motion. No tenderness.  Skin:    General: Skin is warm and dry.  Neurological:      General: No focal deficit present.     Mental Status: She is alert and oriented to person, place, and time.     Cranial Nerves: No cranial nerve deficit.     Motor: No weakness.  Psychiatric:        Mood and Affect: Mood normal.        Behavior: Behavior normal.      No results found for any visits on 10/03/24.      Assessment & Plan:   Problem List Items Addressed This Visit   None   No follow-ups on file.   Total time spent: 20 minutes. This time includes review of previous notes and results and patient face to face interaction during today'Calie Buttrey visit.    Sherrill Cinderella Perry, MD  10/03/2024   This document may have been prepared by [facility] Voice Recognition software and as such may include unintentional dictation errors.

## 2024-10-07 ENCOUNTER — Ambulatory Visit: Admitting: Student in an Organized Health Care Education/Training Program

## 2024-10-08 ENCOUNTER — Other Ambulatory Visit

## 2024-10-09 NOTE — H&P (View-Only) (Signed)
 Referring Physician:  Albina GORMAN Dine, MD [address] [place],  [place] [address]  Primary Physician:  Albina GORMAN Dine, MD  History of Present Illness: 10/10/2024 [name] is here today with a chief complaint of chronic back and lower extremity pain status post lumbar fusion.  Has a history of spinal cord stimulator with initial placement in 2014 with follow-up battery replacement in 2018.  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  She is here to discuss possible repair of her battery versus removal of the stimulator.  Discuss Spinal Cord Stimulator removal  Past Surgery: Lumbar fusion L5, S1 06/26/2013 SCS placement 11/01/2017 SCS replacement   The symptoms are causing a significant impact on the patient's life.   I have utilized the care everywhere function in epic to review the outside records available from external health systems.  Review of Systems:  A 10 point review of systems is negative, except for the pertinent positives and negatives detailed in the HPI.  Past Medical History: Past Medical History:  Diagnosis Date   Allergy    Anemia    Arthritis    knees   CHF (congestive heart failure) (HCC)    COPD (chronic obstructive pulmonary disease) (HCC)    Diabetes mellitus without complication (HCC)    diet controlled   GERD (gastroesophageal reflux disease)    Heart attack (HCC) 2002   Hyperlipidemia    Hypertension    PUD (peptic ulcer disease)    Sleep apnea    can't tolerate CPAP.  Uses O2 only   Spinal cord stimulator status    for lower back pain   Stroke [facility]) 2004   mini-stroke   Vertigo    Wears dentures    full upper and lower    Past Surgical History: Past Surgical History:  Procedure Laterality Date   ABDOMINAL HYSTERECTOMY     back surgery     broken wrist      COLONOSCOPY WITH PROPOFOL  N/A 01/30/2017   Procedure: COLONOSCOPY WITH PROPOFOL ;  Surgeon: Ruel Kung, MD;  Location: [facility] ENDOSCOPY;  Service: Endoscopy;  Laterality: N/A;   COLONOSCOPY WITH PROPOFOL  N/A 02/20/2017   Procedure: Colonoscopy with propofol  ;  Surgeon: Ruel Kung, MD;  Location: [facility] ENDOSCOPY;  Service: Endoscopy;  Laterality: N/A;   COLONOSCOPY WITH PROPOFOL  N/A 03/13/2017   Procedure: COLONOSCOPY WITH PROPOFOL ;  Surgeon: Ruel Kung, MD;  Location: [facility] ENDOSCOPY;  Service: Endoscopy;  Laterality: N/A;   COLONOSCOPY WITH PROPOFOL  N/A 04/17/2017   Procedure: COLONOSCOPY WITH PROPOFOL ;  Surgeon: Kung Ruel, MD;  Location: [facility] ENDOSCOPY;  Service: Endoscopy;  Laterality: N/A;   ESOPHAGOGASTRODUODENOSCOPY N/A 05/05/2020   Procedure: ESOPHAGOGASTRODUODENOSCOPY (EGD);  Surgeon: Toledo, Ladell POUR, MD;  Location: [facility] ENDOSCOPY;  Service: Gastroenterology;  Laterality: N/A;   ESOPHAGOGASTRODUODENOSCOPY (EGD) WITH PROPOFOL  N/A 09/28/2016   Procedure: ESOPHAGOGASTRODUODENOSCOPY (EGD) WITH PROPOFOL ;  Surgeon: Lamar ONEIDA Holmes, MD;  Location: [facility] ENDOSCOPY;  Service: Endoscopy;  Laterality: N/A;   ESOPHAGOGASTRODUODENOSCOPY (EGD) WITH PROPOFOL  N/A 03/13/2017   Procedure: ESOPHAGOGASTRODUODENOSCOPY (EGD) WITH PROPOFOL ;  Surgeon: Ruel Kung, MD;  Location: [facility] ENDOSCOPY;  Service: Endoscopy;  Laterality: N/A;   ESOPHAGOGASTRODUODENOSCOPY (EGD) WITH PROPOFOL  N/A 01/07/2024   Procedure: ESOPHAGOGASTRODUODENOSCOPY (EGD) WITH PROPOFOL ;  Surgeon: Jinny Carmine, MD;  Location: [facility] ENDOSCOPY;  Service: Endoscopy;  Laterality: N/A;   GALLBLADDER SURGERY     GASTRIC BYPASS  GIVENS CAPSULE STUDY N/A 03/13/2017   Procedure: GIVENS CAPSULE STUDY;  Surgeon: Ruel Kung, MD;  Location: [facility] ENDOSCOPY;  Service: Endoscopy;  Laterality: N/A;   HERNIA REPAIR     JOINT REPLACEMENT  1995   REPLACEMENT TOTAL KNEE     ROTATOR CUFF REPAIR      Allergies: Allergies as of 10/10/2024 - Review Complete 10/06/2024   Allergen Reaction Noted   Morphine  Hives 04/12/2015   Iodinated contrast media Hives 04/12/2015   Aspirin Hives 09/02/2015   Etodolac Hives 04/12/2015   Ibuprofen Hives 04/12/2015   Shellfish allergy Hives 04/12/2015   Tylenol  [acetaminophen ] Hives 09/27/2016    Medications:  Current Outpatient Medications:    albuterol  (VENTOLIN  HFA) 108 (90 Base) MCG/ACT inhaler, Inhale 2 puffs into the lungs every 4 (four) hours as needed for wheezing or shortness of breath., Disp: 18 g, Rfl: 3   ALPRAZolam  (XANAX ) 0.25 MG tablet, Take 1 tablet (0.25 mg total) by mouth 3 (three) times daily as needed for anxiety., Disp: 90 tablet, Rfl: 1   apixaban  (ELIQUIS ) 5 MG TABS tablet, Take 1 tablet (5 mg total) by mouth 2 (two) times daily., Disp: 180 tablet, Rfl: 3   atorvastatin  (LIPITOR) 40 MG tablet, TAKE 1 TABLET BY MOUTH AT BEDTIME, Disp: 90 tablet, Rfl: 1   Azelastine  HCl 137 MCG/SPRAY SOLN, Place 1 spray into the nose daily., Disp: 30 mL, Rfl: 2   baclofen  (LIORESAL ) 10 MG tablet, TAKE 1 TABLET BY MOUTH 3 TIMES A DAY, Disp: 90 tablet, Rfl: 5   benzonatate  (TESSALON ) 100 MG capsule, Take 1 capsule (100 mg total) by mouth 3 (three) times daily as needed for up to 10 days for cough., Disp: 30 capsule, Rfl: 0   Calcium  Carbonate-Vitamin D  (OYSTER SHELL CALCIUM  500 + D PO), Take 1 tablet by mouth daily., Disp: , Rfl: 0   cetirizine  (ZYRTEC  ALLERGY) 10 MG tablet, Take 1 tablet (10 mg total) by mouth daily., Disp: 30 tablet, Rfl: 2   cloNIDine  (CATAPRES ) 0.1 MG tablet, TAKE 1 TABLET BY MOUTH AT BEDTIME, Disp: 90 tablet, Rfl: 3   Daridorexant  HCl (QUVIVIQ ) 25 MG TABS, Take 25 mg by mouth at bedtime. Take one tablet by mouth once daily at bedtime (Patient taking differently: Take 50 mg by mouth at bedtime. Take one tablet by mouth once daily at bedtime), Disp: , Rfl:    diclofenac  Sodium (VOLTAREN ) 1 % GEL, APPLY 4 GRAMS TOPICALLY TO NECK TWICE DAILY, Disp: 300 g, Rfl: 1   empagliflozin  (JARDIANCE ) 25 MG TABS  tablet, Take 1 tablet (25 mg total) by mouth daily., Disp: 90 tablet, Rfl: 1   ferrous sulfate  (FEROSUL) 325 (65 FE) MG tablet, Take 1 tablet (325 mg total) by mouth 2 (two) times daily., Disp: 60 tablet, Rfl: 2   fluticasone  (FLONASE ) 50 MCG/ACT nasal spray, Place 1 spray into both nostrils daily., Disp: 16 mL, Rfl: 2   folic acid  (FOLVITE ) 1 MG tablet, TAKE 1 TABLET BY MOUTH DAILY, Disp: 90 tablet, Rfl: 3   glucose blood (ONETOUCH VERIO) test strip, TEST TWICE A DAY, Disp: 100 strip, Rfl: 3   hydrOXYzine  (ATARAX ) 25 MG tablet, Take 1 tablet (25 mg total) by mouth 3 (three) times daily., Disp: 90 tablet, Rfl: 3   Insulin  Pen Needle (B-D UF III MINI PEN NEEDLES) 31G X 5 MM MISC, USE WITH DEVICE TO INJECT LEVEMIR  NIGHTLY, Disp: 100 each, Rfl: 3   meclizine  (ANTIVERT ) 25 MG tablet, TAKE 1 TABLET BY MOUTH 3 TIMES A DAY,  Disp: 270 tablet, Rfl: 1   mirtazapine  (REMERON ) 45 MG tablet, Take 1 tablet (45 mg total) by mouth at bedtime., Disp: 90 tablet, Rfl: 3   MOUNJARO  10 MG/0.5ML Pen, INJECT 10 MG UNDER THE SKIN ONCE WEEKLY, Disp: 2 mL, Rfl: 3   naloxone  (NARCAN ) nasal spray 4 mg/0.1 mL, FOR SUSPECTED OPIOID OVERDOSE, ADMINISTER 1 SPRAY INTO ONE NOSTRIL. REPEAT IN OTHER NOSTRIL USING A NEW DEVICE AFTER 2-3 MINUTES IF NO OR MINIMAL RESPONSE. CALL 911 IF USED, Disp: 2 each, Rfl: 0   NEXIUM  40 MG capsule, Take 1 capsule (40 mg total) by mouth 2 (two) times daily., Disp: 180 capsule, Rfl: 3   nystatin  cream (MYCOSTATIN ), Apply 1 Application topically 2 (two) times daily., Disp: 30 g, Rfl: 0   oxybutynin  (DITROPAN ) 5 MG tablet, Take 1 tablet (5 mg total) by mouth 2 (two) times daily., Disp: 180 tablet, Rfl: 3   Oxycodone  HCl 20 MG TABS, Take 1 tablet (20 mg total) by mouth in the morning, at noon, in the evening, and at bedtime. As needed for pain, Disp: 120 tablet, Rfl: 0   polyethylene glycol (MIRALAX  / GLYCOLAX ) 17 g packet, Take 17 g by mouth daily as needed for mild constipation., Disp: , Rfl:    potassium  chloride (KLOR-CON ) 10 MEQ tablet, Take 1 tablet (10 mEq total) by mouth daily., Disp: 90 tablet, Rfl: 1   TRESIBA  FLEXTOUCH 200 UNIT/ML FlexTouch Pen, Inject 28 Units into the skin every morning., Disp: 9 mL, Rfl: 3   UBRELVY  100 MG TABS, Take 1 tablet (100 mg total) by mouth as needed., Disp: 30 tablet, Rfl: 3  Social History: Social History   Tobacco Use   Smoking status: Never   Smokeless tobacco: Never  Vaping Use   Vaping status: Never Used  Substance Use Topics   Alcohol use: No    Alcohol/week: 0.0 standard drinks of alcohol   Drug use: No    Family Medical History: Family History  Problem Relation Age of Onset   Hypertension Father    COPD Father    Heart disease Father    Breast cancer Neg Hx     Physical Examination: There were no vitals filed for this visit.  General: Patient is in no apparent distress. Attention to examination is appropriate.  Neck:   Supple.  Full range of motion.  Respiratory: Patient is breathing without any difficulty.   NEUROLOGICAL:     Awake, alert, oriented to person, place, and time.  Speech is clear and fluent.   Cranial Nerves: Pupils equal round and reactive to light.  Facial tone is symmetric.  Facial sensation is symmetric. Shoulder shrug is symmetric. Tongue protrusion is midline.    Strength: No major deficits noted on motor examination.  Diffusely hyporeflexic in the bilateral lower extremities.  Antalgic gait, previous incision sites clean and dry.  Imaging: No new imaging to review today.  I have personally reviewed the images and agree with the above interpretation.  Medical Decision Making/Assessment and Plan: Ms. Strahm is a pleasant 63 y.o. female with history of chronic back and lower extremity pain.  She has had previous surgery including a lumbar decompression and a lumbar fusion.  She continued to have significant back pain postoperatively and had a spinal cord stimulator placed in 2014 with revision in  2018.  She has not been utilizing her spinal cord stimulator recently and is continuing to have worsening back pain.  She has not been able to meet with one  of the representatives recently.  She is here today to discuss possible removal versus interrogation.  I did discuss with her that removal of a spinal cord stimulator can often be challenging and sometimes dangerous given the scar tissue around the spinal cord.  It is unlikely to be a pain generator for her.  We did discuss that since she has not recently worked with her spinal cord stimulator rep and she is unsure whether or not this is actively working that I would like to have her follow-up with her spinal cord stimulator rep team to see whether or not they can troubleshoot and see whether or not she is getting adequate therapy from her current device or more likely given the time from her last placement whether or not this is at the end of its life and could utilize replacement.  Plan to follow-up with her after her spinal cord stimulator rep evaluation.  Thank you for involving me in the care of this patient.    Penne MICAEL Sharps MD/MSCR Neurosurgery

## 2024-10-09 NOTE — Progress Notes (Signed)
 Referring Physician:  Albina GORMAN Dine, MD [address] [place],  [place] [address]  Primary Physician:  Albina GORMAN Dine, MD  History of Present Illness: 10/10/2024 [name] is here today with a chief complaint of chronic back and lower extremity pain status post lumbar fusion.  Has a history of spinal cord stimulator with initial placement in 2014 with follow-up battery replacement in 2018.  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  She is here to discuss possible repair of her battery versus removal of the stimulator.  Discuss Spinal Cord Stimulator removal  Past Surgery: Lumbar fusion L5, S1 06/26/2013 SCS placement 11/01/2017 SCS replacement   The symptoms are causing a significant impact on the patient's life.   I have utilized the care everywhere function in epic to review the outside records available from external health systems.  Review of Systems:  A 10 point review of systems is negative, except for the pertinent positives and negatives detailed in the HPI.  Past Medical History: Past Medical History:  Diagnosis Date   Allergy    Anemia    Arthritis    knees   CHF (congestive heart failure) (HCC)    COPD (chronic obstructive pulmonary disease) (HCC)    Diabetes mellitus without complication (HCC)    diet controlled   GERD (gastroesophageal reflux disease)    Heart attack (HCC) 2002   Hyperlipidemia    Hypertension    PUD (peptic ulcer disease)    Sleep apnea    can't tolerate CPAP.  Uses O2 only   Spinal cord stimulator status    for lower back pain   Stroke [facility]) 2004   mini-stroke   Vertigo    Wears dentures    full upper and lower    Past Surgical History: Past Surgical History:  Procedure Laterality Date   ABDOMINAL HYSTERECTOMY     back surgery     broken wrist      COLONOSCOPY WITH PROPOFOL  N/A 01/30/2017   Procedure: COLONOSCOPY WITH PROPOFOL ;  Surgeon: Ruel Kung, MD;  Location: [facility] ENDOSCOPY;  Service: Endoscopy;  Laterality: N/A;   COLONOSCOPY WITH PROPOFOL  N/A 02/20/2017   Procedure: Colonoscopy with propofol  ;  Surgeon: Ruel Kung, MD;  Location: [facility] ENDOSCOPY;  Service: Endoscopy;  Laterality: N/A;   COLONOSCOPY WITH PROPOFOL  N/A 03/13/2017   Procedure: COLONOSCOPY WITH PROPOFOL ;  Surgeon: Ruel Kung, MD;  Location: [facility] ENDOSCOPY;  Service: Endoscopy;  Laterality: N/A;   COLONOSCOPY WITH PROPOFOL  N/A 04/17/2017   Procedure: COLONOSCOPY WITH PROPOFOL ;  Surgeon: Kung Ruel, MD;  Location: [facility] ENDOSCOPY;  Service: Endoscopy;  Laterality: N/A;   ESOPHAGOGASTRODUODENOSCOPY N/A 05/05/2020   Procedure: ESOPHAGOGASTRODUODENOSCOPY (EGD);  Surgeon: Toledo, Ladell POUR, MD;  Location: [facility] ENDOSCOPY;  Service: Gastroenterology;  Laterality: N/A;   ESOPHAGOGASTRODUODENOSCOPY (EGD) WITH PROPOFOL  N/A 09/28/2016   Procedure: ESOPHAGOGASTRODUODENOSCOPY (EGD) WITH PROPOFOL ;  Surgeon: Lamar ONEIDA Holmes, MD;  Location: [facility] ENDOSCOPY;  Service: Endoscopy;  Laterality: N/A;   ESOPHAGOGASTRODUODENOSCOPY (EGD) WITH PROPOFOL  N/A 03/13/2017   Procedure: ESOPHAGOGASTRODUODENOSCOPY (EGD) WITH PROPOFOL ;  Surgeon: Ruel Kung, MD;  Location: [facility] ENDOSCOPY;  Service: Endoscopy;  Laterality: N/A;   ESOPHAGOGASTRODUODENOSCOPY (EGD) WITH PROPOFOL  N/A 01/07/2024   Procedure: ESOPHAGOGASTRODUODENOSCOPY (EGD) WITH PROPOFOL ;  Surgeon: Jinny Carmine, MD;  Location: [facility] ENDOSCOPY;  Service: Endoscopy;  Laterality: N/A;   GALLBLADDER SURGERY     GASTRIC BYPASS  GIVENS CAPSULE STUDY N/A 03/13/2017   Procedure: GIVENS CAPSULE STUDY;  Surgeon: Ruel Kung, MD;  Location: [facility] ENDOSCOPY;  Service: Endoscopy;  Laterality: N/A;   HERNIA REPAIR     JOINT REPLACEMENT  1995   REPLACEMENT TOTAL KNEE     ROTATOR CUFF REPAIR      Allergies: Allergies as of 10/10/2024 - Review Complete 10/06/2024   Allergen Reaction Noted   Morphine  Hives 04/12/2015   Iodinated contrast media Hives 04/12/2015   Aspirin Hives 09/02/2015   Etodolac Hives 04/12/2015   Ibuprofen Hives 04/12/2015   Shellfish allergy Hives 04/12/2015   Tylenol  [acetaminophen ] Hives 09/27/2016    Medications:  Current Outpatient Medications:    albuterol  (VENTOLIN  HFA) 108 (90 Base) MCG/ACT inhaler, Inhale 2 puffs into the lungs every 4 (four) hours as needed for wheezing or shortness of breath., Disp: 18 g, Rfl: 3   ALPRAZolam  (XANAX ) 0.25 MG tablet, Take 1 tablet (0.25 mg total) by mouth 3 (three) times daily as needed for anxiety., Disp: 90 tablet, Rfl: 1   apixaban  (ELIQUIS ) 5 MG TABS tablet, Take 1 tablet (5 mg total) by mouth 2 (two) times daily., Disp: 180 tablet, Rfl: 3   atorvastatin  (LIPITOR) 40 MG tablet, TAKE 1 TABLET BY MOUTH AT BEDTIME, Disp: 90 tablet, Rfl: 1   Azelastine  HCl 137 MCG/SPRAY SOLN, Place 1 spray into the nose daily., Disp: 30 mL, Rfl: 2   baclofen  (LIORESAL ) 10 MG tablet, TAKE 1 TABLET BY MOUTH 3 TIMES A DAY, Disp: 90 tablet, Rfl: 5   benzonatate  (TESSALON ) 100 MG capsule, Take 1 capsule (100 mg total) by mouth 3 (three) times daily as needed for up to 10 days for cough., Disp: 30 capsule, Rfl: 0   Calcium  Carbonate-Vitamin D  (OYSTER SHELL CALCIUM  500 + D PO), Take 1 tablet by mouth daily., Disp: , Rfl: 0   cetirizine  (ZYRTEC  ALLERGY) 10 MG tablet, Take 1 tablet (10 mg total) by mouth daily., Disp: 30 tablet, Rfl: 2   cloNIDine  (CATAPRES ) 0.1 MG tablet, TAKE 1 TABLET BY MOUTH AT BEDTIME, Disp: 90 tablet, Rfl: 3   Daridorexant  HCl (QUVIVIQ ) 25 MG TABS, Take 25 mg by mouth at bedtime. Take one tablet by mouth once daily at bedtime (Patient taking differently: Take 50 mg by mouth at bedtime. Take one tablet by mouth once daily at bedtime), Disp: , Rfl:    diclofenac  Sodium (VOLTAREN ) 1 % GEL, APPLY 4 GRAMS TOPICALLY TO NECK TWICE DAILY, Disp: 300 g, Rfl: 1   empagliflozin  (JARDIANCE ) 25 MG TABS  tablet, Take 1 tablet (25 mg total) by mouth daily., Disp: 90 tablet, Rfl: 1   ferrous sulfate  (FEROSUL) 325 (65 FE) MG tablet, Take 1 tablet (325 mg total) by mouth 2 (two) times daily., Disp: 60 tablet, Rfl: 2   fluticasone  (FLONASE ) 50 MCG/ACT nasal spray, Place 1 spray into both nostrils daily., Disp: 16 mL, Rfl: 2   folic acid  (FOLVITE ) 1 MG tablet, TAKE 1 TABLET BY MOUTH DAILY, Disp: 90 tablet, Rfl: 3   glucose blood (ONETOUCH VERIO) test strip, TEST TWICE A DAY, Disp: 100 strip, Rfl: 3   hydrOXYzine  (ATARAX ) 25 MG tablet, Take 1 tablet (25 mg total) by mouth 3 (three) times daily., Disp: 90 tablet, Rfl: 3   Insulin  Pen Needle (B-D UF III MINI PEN NEEDLES) 31G X 5 MM MISC, USE WITH DEVICE TO INJECT LEVEMIR  NIGHTLY, Disp: 100 each, Rfl: 3   meclizine  (ANTIVERT ) 25 MG tablet, TAKE 1 TABLET BY MOUTH 3 TIMES A DAY,  Disp: 270 tablet, Rfl: 1   mirtazapine  (REMERON ) 45 MG tablet, Take 1 tablet (45 mg total) by mouth at bedtime., Disp: 90 tablet, Rfl: 3   MOUNJARO  10 MG/0.5ML Pen, INJECT 10 MG UNDER THE SKIN ONCE WEEKLY, Disp: 2 mL, Rfl: 3   naloxone  (NARCAN ) nasal spray 4 mg/0.1 mL, FOR SUSPECTED OPIOID OVERDOSE, ADMINISTER 1 SPRAY INTO ONE NOSTRIL. REPEAT IN OTHER NOSTRIL USING A NEW DEVICE AFTER 2-3 MINUTES IF NO OR MINIMAL RESPONSE. CALL 911 IF USED, Disp: 2 each, Rfl: 0   NEXIUM  40 MG capsule, Take 1 capsule (40 mg total) by mouth 2 (two) times daily., Disp: 180 capsule, Rfl: 3   nystatin  cream (MYCOSTATIN ), Apply 1 Application topically 2 (two) times daily., Disp: 30 g, Rfl: 0   oxybutynin  (DITROPAN ) 5 MG tablet, Take 1 tablet (5 mg total) by mouth 2 (two) times daily., Disp: 180 tablet, Rfl: 3   Oxycodone  HCl 20 MG TABS, Take 1 tablet (20 mg total) by mouth in the morning, at noon, in the evening, and at bedtime. As needed for pain, Disp: 120 tablet, Rfl: 0   polyethylene glycol (MIRALAX  / GLYCOLAX ) 17 g packet, Take 17 g by mouth daily as needed for mild constipation., Disp: , Rfl:    potassium  chloride (KLOR-CON ) 10 MEQ tablet, Take 1 tablet (10 mEq total) by mouth daily., Disp: 90 tablet, Rfl: 1   TRESIBA  FLEXTOUCH 200 UNIT/ML FlexTouch Pen, Inject 28 Units into the skin every morning., Disp: 9 mL, Rfl: 3   UBRELVY  100 MG TABS, Take 1 tablet (100 mg total) by mouth as needed., Disp: 30 tablet, Rfl: 3  Social History: Social History   Tobacco Use   Smoking status: Never   Smokeless tobacco: Never  Vaping Use   Vaping status: Never Used  Substance Use Topics   Alcohol use: No    Alcohol/week: 0.0 standard drinks of alcohol   Drug use: No    Family Medical History: Family History  Problem Relation Age of Onset   Hypertension Father    COPD Father    Heart disease Father    Breast cancer Neg Hx     Physical Examination: There were no vitals filed for this visit.  General: Patient is in no apparent distress. Attention to examination is appropriate.  Neck:   Supple.  Full range of motion.  Respiratory: Patient is breathing without any difficulty.   NEUROLOGICAL:     Awake, alert, oriented to person, place, and time.  Speech is clear and fluent.   Cranial Nerves: Pupils equal round and reactive to light.  Facial tone is symmetric.  Facial sensation is symmetric. Shoulder shrug is symmetric. Tongue protrusion is midline.    Strength: No major deficits noted on motor examination.  Diffusely hyporeflexic in the bilateral lower extremities.  Antalgic gait, previous incision sites clean and dry.  Imaging: No new imaging to review today.  I have personally reviewed the images and agree with the above interpretation.  Medical Decision Making/Assessment and Plan: Ms. Strahm is a pleasant 63 y.o. female with history of chronic back and lower extremity pain.  She has had previous surgery including a lumbar decompression and a lumbar fusion.  She continued to have significant back pain postoperatively and had a spinal cord stimulator placed in 2014 with revision in  2018.  She has not been utilizing her spinal cord stimulator recently and is continuing to have worsening back pain.  She has not been able to meet with one  of the representatives recently.  She is here today to discuss possible removal versus interrogation.  I did discuss with her that removal of a spinal cord stimulator can often be challenging and sometimes dangerous given the scar tissue around the spinal cord.  It is unlikely to be a pain generator for her.  We did discuss that since she has not recently worked with her spinal cord stimulator rep and she is unsure whether or not this is actively working that I would like to have her follow-up with her spinal cord stimulator rep team to see whether or not they can troubleshoot and see whether or not she is getting adequate therapy from her current device or more likely given the time from her last placement whether or not this is at the end of its life and could utilize replacement.  Plan to follow-up with her after her spinal cord stimulator rep evaluation.  Thank you for involving me in the care of this patient.    Penne MICAEL Sharps MD/MSCR Neurosurgery

## 2024-10-10 ENCOUNTER — Ambulatory Visit (INDEPENDENT_AMBULATORY_CARE_PROVIDER_SITE_OTHER)

## 2024-10-10 ENCOUNTER — Ambulatory Visit: Admitting: Neurosurgery

## 2024-10-10 VITALS — BP 132/84 | Wt 262.8 lb

## 2024-10-10 DIAGNOSIS — T85192A Other mechanical complication of implanted electronic neurostimulator (electrode) of spinal cord, initial encounter: Secondary | ICD-10-CM

## 2024-10-10 DIAGNOSIS — G894 Chronic pain syndrome: Secondary | ICD-10-CM | POA: Diagnosis not present

## 2024-10-10 DIAGNOSIS — M961 Postlaminectomy syndrome, not elsewhere classified: Secondary | ICD-10-CM

## 2024-10-10 NOTE — Patient Instructions (Signed)
 [employer] Rep for spinal cord stimulator Elspeth Fischer [phone]

## 2024-10-15 ENCOUNTER — Other Ambulatory Visit: Payer: Self-pay | Admitting: Internal Medicine

## 2024-10-15 DIAGNOSIS — N1831 Chronic kidney disease, stage 3a: Secondary | ICD-10-CM

## 2024-10-21 ENCOUNTER — Ambulatory Visit (INDEPENDENT_AMBULATORY_CARE_PROVIDER_SITE_OTHER): Admitting: Neurosurgery

## 2024-10-21 ENCOUNTER — Telehealth: Payer: Self-pay

## 2024-10-21 ENCOUNTER — Other Ambulatory Visit: Payer: Self-pay

## 2024-10-21 ENCOUNTER — Ambulatory Visit: Payer: Self-pay | Admitting: Neurosurgery

## 2024-10-21 DIAGNOSIS — T85192A Other mechanical complication of implanted electronic neurostimulator (electrode) of spinal cord, initial encounter: Secondary | ICD-10-CM

## 2024-10-21 DIAGNOSIS — G894 Chronic pain syndrome: Secondary | ICD-10-CM

## 2024-10-21 DIAGNOSIS — M961 Postlaminectomy syndrome, not elsewhere classified: Secondary | ICD-10-CM

## 2024-10-21 DIAGNOSIS — Z01818 Encounter for other preprocedural examination: Secondary | ICD-10-CM

## 2024-10-21 NOTE — Telephone Encounter (Signed)
 Called patient to schedule surgery and discuss surgery instructions as listed below:   Please see below for information in regards to your upcoming surgery:   Planned surgery: Spinal Cord Stimulator Battery Exchange, Right side   Surgery date: 11/06/24 at [facility] (Medical Mall: [address] [place], [place] [address]) - you will find out your arrival time the business day before your surgery.   Pre-op appointment at [facility] Pre-admit Testing: you will receive a call with a date/time for this appointment. If you are scheduled for an in person appointment, Pre-admit Testing is located on the first floor of the [facility] building, [address] [facility], [address]. During this appointment, they will advise you which medications you can take the morning of surgery, and which medications you will need to hold for surgery. Labs (such as blood work, EKG) may be done at your pre-op appointment. You are not required to fast for these labs. Should you need to change your pre-op appointment, please call Pre-admit testing at [phone].     Blood thinners:   Eliquis  (apixaban ):  stop Eliquis  3 days prior, resume Eliquis  14 days after   Diabetes/heart failure/kidney disease/weight loss medications that require an extended hold: Per anesthesia guidelines (due to the increased risk of aspiration caused by delayed gastric emptying):  Empagliflozin  (Jardiance ) - hold for 3 days prior to surgery  Tirzepatide  (Mounjaro ) Injectible - hold for 7 days prior to surgery    Surgical clearance: we will send a clearance form to Cinderella Perry, MD. They may wish to see you in their office prior to signing the clearance form. If so, they may call you to schedule an appointment.    Common restrictions after spine surgery: No bending, lifting, or twisting ("BLT"). Avoid lifting objects heavier than 10 pounds for the first 6 weeks after surgery. Where possible, avoid  household activities that involve lifting, bending, reaching, pushing, or pulling such as laundry, vacuuming, grocery shopping, and childcare. Try to arrange for help from friends and family for these activities while you heal. Do not drive while taking prescription pain medication. Weeks 6 through 12 after surgery: avoid lifting more than 25 pounds.      How to contact us :  If you have any questions/concerns before or after surgery, you can reach us  at [phone], or you can send a mychart message. We can be reached by phone or mychart 8am-4pm, Monday-Friday.  *Please note: Calls after 4pm are forwarded to a third party answering service. Mychart messages are not routinely monitored during evenings, weekends, and holidays. Please call our office to contact the answering service for urgent concerns during non-business hours.    If you have FMLA/disability paperwork, please drop it off or fax it to [phone]   Appointments/FMLA & disability paperwork: Reche Hait, & Nichole Registered Nurse/Surgery scheduler: Kendelyn, RN & Katie, RN Certified Medical Assistants: Don, CMA, Elenor, CMA, Damien, CMA, & Auston, [place] Physician Assistants: Lyle Decamp, PA-C, Edsel Goods, PA-C & Glade Boys, PA-C Surgeons: Penne Sharps, MD & Reeves Daisy, MD    [facility] PREADMIT TESTING VISIT and SURGERY INFORMATION SHEET   Now that surgery has been scheduled you can anticipate several phone calls from [facility] services. A pharmacy technician will call you to verify your current list of medications taken at home.               The Pre-Service Center will call to verify your insurance information and to give you  billing estimates and information.             The Preadmit Testing Office will be calling to schedule a visit to obtain information for the anesthesia team and provide instructions on preparation for surgery.  What can you expect for the Preadmit Testing  Visit: Appointments may be scheduled in-person or by telephone.  If a telephone visit is scheduled, you may be asked to come into the office to have lab tests or other studies performed.   This visit will not be completed any greater than 14 days prior to your surgery.  If your surgery has been scheduled for a future date, please do not be alarmed if we have not contacted you to schedule an appointment more than a month prior to the surgery date.    Please be prepared to provide the following information during this appointment:            -Personal medical history                                               -Medication and allergy list            -Any history of problems with anesthesia              -Recent lab work or diagnostic studies            -Please notify us  of any needs we should be aware of to provide the best care possible           -You will be provided with instructions on how to prepare for your surgery.    On The Day of Surgery:  You must have a driver to take you home after surgery, you will be asked not to drive for 24 hours following surgery.  Taxi, Gisele and non-medical transport will not be acceptable means of transportation unless you have a responsible individual who will be traveling with you.  Visitors in the surgical area:   2 people will be able to visit you in your room once your preparation for surgery has been completed. During surgery, your visitors will be asked to wait in the Surgery Waiting Area.  It is not a requirement for them to stay, if they prefer to leave and come back.  Your visitor(s) will be given an update once the surgery has been completed.  No visitors are allowed in the initial recovery room to respect patient privacy and safety.  Once you are more awake and transfer to the secondary recovery area, or are transferred to an inpatient room, visitors will again be able to see you.  To respect and protect your privacy: We will ask on the day of  surgery who your driver will be and what the contact number for that individual will be. We will ask if it is okay to share information with this individual, or if there is an alternative individual that we, or the surgeon, should contact to provide updates and information. If family or friends come to the surgical information desk requesting information about you, who you have not listed with us , no information will be given.   It may be helpful to designate someone as the main contact who will be responsible for updating your other friends and family.    PREADMIT TESTING OFFICE: [phone] SAME DAY SURGERY: [phone] We  look forward to caring for you before and throughout the process of your surgery.

## 2024-10-21 NOTE — Progress Notes (Signed)
 I had a follow-up phone call with the patient today.  She was at home and I was in the office.  We had a follow-up after she spoke with Garnette from her device rep team.  It was found that her battery is at end-of-life and not currently functioning.  She is unclear how long she has not been able to use it but has noticed continued worsening of her back pain.  We did discuss that this would likely not be a complete fix for her in regards to her back pain as it was not giving her 100% relief previously, but it was giving her enough relief that it was helping with some of her activities of daily living.  We discussed 2 options either removing the spinal cord stimulator completely which I did not recommend as it is not likely causing her back pain and I can be dangerous to remove once it is scarred into place.  We also discussed replacing the battery which is at the end of its life so that she could restart her therapy which was previously helping although it did not completely resolve her pain.  I did discuss with her that the plan would be to replace the battery/IPG and to expect that it would not give her complete relief but if it was able to help with her activities of daily living and give her a significant amount of pain relief it would be worth the risk.  We discussed risk benefits of surgery.  She like to go forward.  Will plan to reach out to help her schedule.  It will be a  right sided IPG replacement  [name] MD,   Spent a total of 10 minutes on the call today.

## 2024-10-21 NOTE — Addendum Note (Signed)
 Addended by: Raiyan Dalesandro E on: 10/21/2024 12:19 PM   Modules accepted: Orders

## 2024-10-22 ENCOUNTER — Other Ambulatory Visit

## 2024-10-26 ENCOUNTER — Other Ambulatory Visit: Payer: Self-pay | Admitting: Internal Medicine

## 2024-10-29 NOTE — Patient Instructions (Addendum)
 Your procedure is scheduled on:11-06-24 Thursday Report to the Registration Desk on the 1st floor of the Medical Mall.Then proceed to the 2nd floor Surgery Desk To find out your arrival time, please call [phone] between 1PM - 3PM on:11-05-24 Wednesday If your arrival time is 6:00 am, do not arrive before that time as the Medical Mall entrance doors do not open until 6:00 am.  REMEMBER: Instructions that are not followed completely may result in serious medical risk, up to and including death; or upon the discretion of your surgeon and anesthesiologist your surgery may need to be rescheduled.  Do not eat food after midnight the night before surgery.  No gum chewing or hard candies.  You may however, drink Water up to 2 hours before you are scheduled to arrive for your surgery. Do not drink anything within 2 hours of your scheduled arrival time.  One week prior to surgery: Stop ANY OVER THE COUNTER supplements until after surgery (Calcium -Vitamin D , Ferrous Sulfate , Folic Acid )  Stop apixaban  (ELIQUIS ) 3 days prior to surgery-Last dose will be on 11-02-24 Sunday  Stop empagliflozin  (JARDIANCE ) 3 days prior to surgery-Last dose will be on 11-02-24 Sunday  Stop MOUNJARO  7 days prior to surgery-Do NOT take again until AFTER surgery  Continue taking all of your other prescription medications up until the day of surgery.  ON THE DAY OF SURGERY ONLY TAKE THESE MEDICATIONS WITH SIPS OF WATER: -ALPRAZolam  (XANAX )  -baclofen  (LIORESAL )  -hydrOXYzine  (ATARAX )  -meclizine  (ANTIVERT )  -NEXIUM   -oxybutynin  (DITROPAN )  -Oxycodone   Do NOT take any Insulin  the morning of surgery  Bring your Albuterol  Inhaler to the hospital   No Alcohol for 24 hours before or after surgery.  No Smoking including e-cigarettes for 24 hours before surgery.  No chewable tobacco products for at least 6 hours before surgery.  No nicotine patches on the day of surgery.  Do not use any recreational drugs for  at least a week (preferably 2 weeks) before your surgery.  Please be advised that the combination of cocaine and anesthesia may have negative outcomes, up to and including death. If you test positive for cocaine, your surgery will be cancelled.  On the morning of surgery brush your teeth with toothpaste and water, you may rinse your mouth with mouthwash if you wish. Do not swallow any toothpaste or mouthwash.  Use CHG Soap as directed on instruction sheet.  Do not wear jewelry, make-up, hairpins, clips or nail polish.  For welded (permanent) jewelry: bracelets, anklets, waist bands, etc.  Please have this removed prior to surgery.  If it is not removed, there is a chance that hospital personnel will need to cut it off on the day of surgery.  Do not wear lotions, powders, or perfumes.   Do not shave body hair from the neck down 48 hours before surgery.  Contact lenses, hearing aids and dentures may not be worn into surgery.  Do not bring valuables to the hospital. [facility] is not responsible for any missing/lost belongings or valuables.   Notify your doctor if there is any change in your medical condition (cold, fever, infection).  Wear comfortable clothing (specific to your surgery type) to the hospital.  After surgery, you can help prevent lung complications by doing breathing exercises.  Take deep breaths and cough every 1-2 hours. Your doctor may order a device called an Incentive Spirometer to help you take deep breaths. When coughing or sneezing, hold a pillow firmly against your incision with both  hands. This is called "splinting." Doing this helps protect your incision. It also decreases belly discomfort.  If you are being admitted to the hospital overnight, leave your suitcase in the car. After surgery it may be brought to your room.  In case of increased patient census, it may be necessary for you, the patient, to continue your postoperative care in the Same Day Surgery  department.  If you are being discharged the day of surgery, you will not be allowed to drive home. You will need a responsible individual to drive you home and stay with you for 24 hours after surgery.   If you are taking public transportation, you will need to have a responsible individual with you.  Please call the Pre-admissions Testing Dept. at [phone] if you have any questions about these instructions.  Surgery Visitation Policy:  Patients having surgery or a procedure may have two visitors.  Children under the age of 110 must have an adult with them who is not the patient.                                                                                                             Preparing for Surgery with CHLORHEXIDINE  GLUCONATE (CHG) Soap  Chlorhexidine  Gluconate (CHG) Soap  o An antiseptic cleaner that kills germs and bonds with the skin to continue killing germs even after washing  o Used for showering the night before surgery and morning of surgery  Before surgery, you can play an important role by reducing the number of germs on your skin.  CHG (Chlorhexidine  gluconate) soap is an antiseptic cleanser which kills germs and bonds with the skin to continue killing germs even after washing.  Please do not use if you have an allergy to CHG or antibacterial soaps. If your skin becomes reddened/irritated stop using the CHG.  1. Shower the NIGHT BEFORE SURGERY with CHG soap.  2. If you choose to wash your hair, wash your hair first as usual with your normal shampoo.  3. After shampooing, rinse your hair and body thoroughly to remove the shampoo.  4. Use CHG as you would any other liquid soap. You can apply CHG directly to the skin and wash gently with a clean washcloth.  5. Apply the CHG soap to your body only from the neck down. Do not use on open wounds or open sores. Avoid contact with your eyes, ears, mouth, and genitals (private parts). Wash face and genitals  (private parts) with your normal soap.  6. Wash thoroughly, paying special attention to the area where your surgery will be performed.  7. Thoroughly rinse your body with warm water.  8. Do not shower/wash with your normal soap after using and rinsing off the CHG soap.  9. Do not use lotions, oils, etc., after showering with CHG.  10. Pat yourself dry with a clean towel.  11. Wear clean pajamas to bed the night before surgery.  12. Place clean sheets on your bed the night of your shower and do not sleep with  pets.  13. Do not apply any deodorants/lotions/powders.  14. Please wear clean clothes to the hospital.  15. Remember to brush your teeth with your regular toothpaste.   Merch[employer], Retail to address health-related social needs:  [email or web address]

## 2024-10-30 ENCOUNTER — Inpatient Hospital Stay: Admission: RE | Admit: 2024-10-30 | Discharge: 2024-10-30 | Attending: Neurosurgery | Admitting: Neurosurgery

## 2024-10-30 ENCOUNTER — Other Ambulatory Visit: Payer: Self-pay

## 2024-10-30 VITALS — BP 132/84 | HR 71 | Resp 16 | Ht 67.0 in | Wt 248.9 lb

## 2024-10-30 DIAGNOSIS — Z01818 Encounter for other preprocedural examination: Secondary | ICD-10-CM

## 2024-10-30 HISTORY — DX: Encounter for adjustment and management of neurostimulator: Z45.42

## 2024-10-30 HISTORY — DX: Insomnia, unspecified: G47.00

## 2024-10-30 HISTORY — DX: Anxiety disorder, unspecified: F41.9

## 2024-10-30 HISTORY — DX: Type 2 diabetes mellitus without complications: E11.9

## 2024-10-30 HISTORY — DX: Other pulmonary embolism without acute cor pulmonale: I26.99

## 2024-10-30 HISTORY — DX: Depression, unspecified: F32.A

## 2024-10-30 LAB — SURGICAL PCR SCREEN
MRSA, PCR: NEGATIVE
Staphylococcus aureus: NEGATIVE

## 2024-11-03 ENCOUNTER — Ambulatory Visit (INDEPENDENT_AMBULATORY_CARE_PROVIDER_SITE_OTHER): Admitting: Internal Medicine

## 2024-11-03 VITALS — BP 158/86 | HR 80 | Ht 67.0 in | Wt 252.0 lb

## 2024-11-03 DIAGNOSIS — N1831 Chronic kidney disease, stage 3a: Secondary | ICD-10-CM

## 2024-11-03 DIAGNOSIS — F418 Other specified anxiety disorders: Secondary | ICD-10-CM | POA: Diagnosis not present

## 2024-11-03 DIAGNOSIS — E782 Mixed hyperlipidemia: Secondary | ICD-10-CM

## 2024-11-03 DIAGNOSIS — I10 Essential (primary) hypertension: Secondary | ICD-10-CM

## 2024-11-03 DIAGNOSIS — E1122 Type 2 diabetes mellitus with diabetic chronic kidney disease: Secondary | ICD-10-CM

## 2024-11-03 DIAGNOSIS — G8929 Other chronic pain: Secondary | ICD-10-CM | POA: Diagnosis not present

## 2024-11-03 DIAGNOSIS — Z794 Long term (current) use of insulin: Secondary | ICD-10-CM

## 2024-11-03 MED ORDER — OXYCODONE HCL 20 MG PO TABS: 20.0000 mg | ORAL_TABLET | Freq: Four times a day (QID) | ORAL | 0 refills | Status: DC

## 2024-11-03 MED ORDER — ALPRAZOLAM 0.25 MG PO TABS: 0.2500 mg | ORAL_TABLET | Freq: Three times a day (TID) | ORAL | 1 refills | Status: AC | PRN

## 2024-11-03 NOTE — Progress Notes (Signed)
 Established Patient Office Visit  Subjective:  Patient ID: Esterlene Atiyeh, female    DOB: 1961-01-11  Age: 63 y.o. MRN: [id number]  Chief Complaint  Patient presents with   Pain Management    PM     Here for pain management follow up. Chronic pain well controlled on current analgesia. Last drug screen satisfactory and pill counts have also been satisfactory. Awaits implantation of new battery in her stimulator.     No other concerns at this time.   Past Medical History:  Diagnosis Date   Allergy    Anemia    Anxiety    Arthritis    knees   Battery end of life of spinal cord stimulator    CHF (congestive heart failure) (HCC)    COPD (chronic obstructive pulmonary disease) (HCC)    Depression    DM (diabetes mellitus), type 2 (HCC)    GERD (gastroesophageal reflux disease)    Heart attack (HCC) 2002   Hyperlipidemia    Hypertension    Insomnia    PUD (peptic ulcer disease)    Pulmonary embolism (HCC)    Sleep apnea    can't tolerate CPAP.  Uses O2 only   Spinal cord stimulator status    for lower back pain   Stroke [facility]) 2004   mini-stroke-left sided weakness   Vertigo    Wears dentures    full upper and lower    Past Surgical History:  Procedure Laterality Date   ABDOMINAL HYSTERECTOMY     back surgery     broken wrist     COLONOSCOPY WITH PROPOFOL  N/A 01/30/2017   Procedure: COLONOSCOPY WITH PROPOFOL ;  Surgeon: Ruel Kung, MD;  Location: [facility] ENDOSCOPY;  Service: Endoscopy;  Laterality: N/A;   COLONOSCOPY WITH PROPOFOL  N/A 02/20/2017   Procedure: Colonoscopy with propofol  ;  Surgeon: Ruel Kung, MD;  Location: [facility] ENDOSCOPY;  Service: Endoscopy;  Laterality: N/A;   COLONOSCOPY WITH PROPOFOL  N/A 03/13/2017   Procedure: COLONOSCOPY WITH PROPOFOL ;  Surgeon: Ruel Kung, MD;  Location: [facility] ENDOSCOPY;  Service: Endoscopy;  Laterality: N/A;   COLONOSCOPY WITH PROPOFOL  N/A 04/17/2017   Procedure: COLONOSCOPY WITH PROPOFOL ;  Surgeon: Kung Ruel, MD;   Location: [facility] ENDOSCOPY;  Service: Endoscopy;  Laterality: N/A;   ESOPHAGOGASTRODUODENOSCOPY N/A 05/05/2020   Procedure: ESOPHAGOGASTRODUODENOSCOPY (EGD);  Surgeon: Toledo, Ladell POUR, MD;  Location: [facility] ENDOSCOPY;  Service: Gastroenterology;  Laterality: N/A;   ESOPHAGOGASTRODUODENOSCOPY (EGD) WITH PROPOFOL  N/A 09/28/2016   Procedure: ESOPHAGOGASTRODUODENOSCOPY (EGD) WITH PROPOFOL ;  Surgeon: Lamar ONEIDA Holmes, MD;  Location: [facility] ENDOSCOPY;  Service: Endoscopy;  Laterality: N/A;   ESOPHAGOGASTRODUODENOSCOPY (EGD) WITH PROPOFOL  N/A 03/13/2017   Procedure: ESOPHAGOGASTRODUODENOSCOPY (EGD) WITH PROPOFOL ;  Surgeon: Ruel Kung, MD;  Location: [facility] ENDOSCOPY;  Service: Endoscopy;  Laterality: N/A;   ESOPHAGOGASTRODUODENOSCOPY (EGD) WITH PROPOFOL  N/A 01/07/2024   Procedure: ESOPHAGOGASTRODUODENOSCOPY (EGD) WITH PROPOFOL ;  Surgeon: Jinny Carmine, MD;  Location: [facility] ENDOSCOPY;  Service: Endoscopy;  Laterality: N/A;   GALLBLADDER SURGERY     GASTRIC BYPASS     GIVENS CAPSULE STUDY N/A 03/13/2017   Procedure: GIVENS CAPSULE STUDY;  Surgeon: Ruel Kung, MD;  Location: [facility] ENDOSCOPY;  Service: Endoscopy;  Laterality: N/A;   HERNIA REPAIR     JOINT REPLACEMENT  1995   REPLACEMENT TOTAL KNEE     ROTATOR CUFF REPAIR     SPINAL CORD STIMULATOR INSERTION      Social History   Socioeconomic History   Marital status: Widowed    Spouse name: Not on file  Number of children: Not on file   Years of education: Not on file   Highest education level: Not on file  Occupational History   Occupation: disabled  Tobacco Use   Smoking status: Never   Smokeless tobacco: Never  Vaping Use   Vaping status: Never Used  Substance and Sexual Activity   Alcohol use: No    Alcohol/week: 0.0 standard drinks of alcohol   Drug use: No   Sexual activity: Yes  Other Topics Concern   Not on file  Social History Narrative   Not on file   Social Drivers of Health   Financial Resource Strain: Low Risk (07/20/2022)    Received from [facility]   Overall Financial Resource Strain (CARDIA)    Difficulty of Paying Living Expenses: Not hard at all  Food Insecurity: No Food Insecurity (07/20/2022)   Received from [facility]   Hunger Vital Sign    Within the past 12 months, you worried that your food would run out before you got the money to buy more.: Never true    Within the past 12 months, the food you bought just didn't last and you didn't have money to get more.: Never true  Transportation Needs: No Transportation Needs (07/20/2022)   Received from [facility]   PRAPARE - Transportation    Lack of Transportation (Medical): No    Lack of Transportation (Non-Medical): No  Physical Activity: Not on file  Stress: Not on file  Social Connections: Not on file  Intimate Partner Violence: Not on file    Family History  Problem Relation Age of Onset   Hypertension Father    COPD Father    Heart disease Father    Breast cancer Neg Hx     Allergies  Allergen Reactions   Morphine  Hives   Iodinated Contrast Media Hives   Aspirin Hives    Noted on MD progress notes and discussed with MD 09/02/15   Etodolac Hives   Ibuprofen Hives   Shellfish Allergy Hives   Tylenol  [Acetaminophen ] Hives    Upset stomach     Outpatient Medications Prior to Visit  Medication Sig   albuterol  (VENTOLIN  HFA) 108 (90 Base) MCG/ACT inhaler Inhale 2 puffs into the lungs every 4 (four) hours as needed for wheezing or shortness of breath.   apixaban  (ELIQUIS ) 5 MG TABS tablet Take 1 tablet (5 mg total) by mouth 2 (two) times daily.   atorvastatin  (LIPITOR) 40 MG tablet TAKE 1 TABLET BY MOUTH AT BEDTIME   Azelastine  HCl 137 MCG/SPRAY SOLN Place 1 spray into the nose daily. (Patient taking differently: Place 1 spray into both nostrils at bedtime.)   baclofen  (LIORESAL ) 10 MG tablet TAKE 1 TABLET BY MOUTH 3 TIMES A DAY   Calcium  Carbonate-Vitamin D  (OYSTER SHELL CALCIUM  500 + D PO) Take 1 tablet by mouth daily.    cetirizine  (ZYRTEC  ALLERGY) 10 MG tablet Take 1 tablet (10 mg total) by mouth daily.   cloNIDine  (CATAPRES ) 0.1 MG tablet TAKE 1 TABLET BY MOUTH AT BEDTIME   Daridorexant  HCl (QUVIVIQ ) 25 MG TABS Take 25 mg by mouth at bedtime. Take one tablet by mouth once daily at bedtime (Patient taking differently: Take 50 mg by mouth at bedtime.)   diclofenac  Sodium (VOLTAREN ) 1 % GEL APPLY 4 GRAMS TOPICALLY TO NECK TWICE DAILY (Patient taking differently: Apply 2 g topically daily as needed (Neck and back pain).)   empagliflozin  (JARDIANCE ) 25 MG TABS tablet Take 1 tablet (25  mg total) by mouth daily.   ferrous sulfate  (FEROSUL) 325 (65 FE) MG tablet Take 1 tablet (325 mg total) by mouth 2 (two) times daily.   fluticasone  (FLONASE ) 50 MCG/ACT nasal spray Place 1 spray into both nostrils daily. (Patient taking differently: Place 1 spray into both nostrils at bedtime.)   folic acid  (FOLVITE ) 1 MG tablet TAKE 1 TABLET BY MOUTH DAILY   glucose blood (ONETOUCH VERIO) test strip TEST TWICE A DAY   hydrOXYzine  (ATARAX ) 25 MG tablet Take 1 tablet (25 mg total) by mouth 3 (three) times daily.   Insulin  Pen Needle (B-D UF III MINI PEN NEEDLES) 31G X 5 MM MISC USE WITH DEVICE TO INJECT LEVEMIR  NIGHTLY   meclizine  (ANTIVERT ) 25 MG tablet TAKE 1 TABLET BY MOUTH 3 TIMES A DAY   mirtazapine  (REMERON ) 45 MG tablet Take 1 tablet (45 mg total) by mouth at bedtime.   MOUNJARO  10 MG/0.5ML Pen INJECT 10 MG UNDER THE SKIN ONCE WEEKLY   naloxone  (NARCAN ) nasal spray 4 mg/0.1 mL FOR SUSPECTED OPIOID OVERDOSE, ADMINISTER 1 SPRAY INTO ONE NOSTRIL. REPEAT IN OTHER NOSTRIL USING A NEW DEVICE AFTER 2-3 MINUTES IF NO OR MINIMAL RESPONSE. CALL 911 IF USED   NEXIUM  40 MG capsule Take 1 capsule (40 mg total) by mouth 2 (two) times daily.   nystatin  cream (MYCOSTATIN ) Apply 1 Application topically 2 (two) times daily. (Patient taking differently: Apply 1 Application topically 2 (two) times daily as needed (Rash).)   oxybutynin  (DITROPAN ) 5 MG  tablet Take 1 tablet (5 mg total) by mouth 2 (two) times daily.   OXYGEN  Inhale 2 L into the lungs at bedtime.   polyethylene glycol (MIRALAX  / GLYCOLAX ) 17 g packet Take 17 g by mouth daily as needed for mild constipation.   potassium chloride  (KLOR-CON ) 10 MEQ tablet Take 1 tablet (10 mEq total) by mouth daily. (Patient taking differently: Take 10 mEq by mouth every morning.)   TRESIBA  FLEXTOUCH 200 UNIT/ML FlexTouch Pen INJECT 28 UNITS UNDER THE SKIN EVERY MORNING (Patient taking differently: 28 Units every morning.)   UBRELVY  100 MG TABS Take 1 tablet (100 mg total) by mouth as needed.   [DISCONTINUED] ALPRAZolam  (XANAX ) 0.25 MG tablet Take 1 tablet (0.25 mg total) by mouth 3 (three) times daily as needed for anxiety. (Patient taking differently: Take 0.25 mg by mouth 3 (three) times daily.)   [DISCONTINUED] Oxycodone  HCl 20 MG TABS Take 1 tablet (20 mg total) by mouth in the morning, at noon, in the evening, and at bedtime. As needed for pain (Patient taking differently: Take 20 mg by mouth in the morning, at noon, in the evening, and at bedtime.)   No facility-administered medications prior to visit.    Review of Systems  HENT:  Negative for congestion.   Eyes: Negative.   Respiratory:  Negative for cough.   Cardiovascular: Negative.   Gastrointestinal: Negative.   Genitourinary: Negative.   Musculoskeletal:  Positive for back pain.  Skin: Negative.   Neurological: Negative.   Endo/Heme/Allergies: Negative.   Psychiatric/Behavioral:  The patient has insomnia.        Objective:   BP (!) 158/86   Pulse 80   Ht 5' 7 (1.702 m)   Wt 252 lb (114.3 kg)   SpO2 98%   BMI 39.47 kg/m   Vitals:   11/03/24 1035  BP: (!) 158/86  Pulse: 80  Height: 5' 7 (1.702 m)  Weight: 252 lb (114.3 kg)  SpO2: 98%  BMI (Calculated): 39.46  Physical Exam Vitals reviewed.  Constitutional:      General: She is not in acute distress.    Appearance: She is obese.  HENT:     Head:  Normocephalic.     Nose: Nose normal.     Mouth/Throat:     Mouth: Mucous membranes are moist.  Eyes:     Extraocular Movements: Extraocular movements intact.     Pupils: Pupils are equal, round, and reactive to light.  Cardiovascular:     Rate and Rhythm: Normal rate and regular rhythm.     Heart sounds: No murmur heard. Pulmonary:     Effort: Pulmonary effort is normal.     Breath sounds: No rhonchi or rales.  Abdominal:     General: Abdomen is flat.     Palpations: There is no hepatomegaly, splenomegaly or mass.  Musculoskeletal:        General: Normal range of motion.     Cervical back: Normal range of motion. No tenderness.  Skin:    General: Skin is warm and dry.  Neurological:     General: No focal deficit present.     Mental Status: She is alert and oriented to person, place, and time.     Cranial Nerves: No cranial nerve deficit.     Motor: No weakness.  Psychiatric:        Mood and Affect: Mood normal.        Behavior: Behavior normal.      No results found for any visits on 11/03/24.      Assessment & Plan:  Casey was seen today for pain management.  Other chronic pain -     oxyCODONE  HCl; Take 1 tablet (20 mg total) by mouth in the morning, at noon, in the evening, and at bedtime. As needed for pain  Dispense: 120 tablet; Refill: 0  Type 2 diabetes mellitus with stage 3a chronic kidney disease, with long-term current use of insulin  (HCC) -     Hemoglobin A1c  Depression with anxiety -     ALPRAZolam ; Take 1 tablet (0.25 mg total) by mouth 3 (three) times daily as needed for anxiety.  Dispense: 90 tablet; Refill: 1  Mixed hyperlipidemia -     CK -     Hepatic function panel -     Lipid panel    Problem List Items Addressed This Visit       Endocrine   Type II diabetes mellitus with renal manifestations (HCC)   Relevant Orders   Hemoglobin A1c     Other   Other chronic pain - Primary   Relevant Medications   Oxycodone  HCl 20 MG TABS    HLD (hyperlipidemia)   Relevant Orders   Hepatic function panel   Lipid panel   Depression with anxiety   Relevant Medications   ALPRAZolam  (XANAX ) 0.25 MG tablet    Return in about 1 month (around 12/04/2024) for fu with labs prior.   Total time spent: 20 minutes. This time includes review of previous notes and results and patient face to face interaction during today'Keali Mccraw visit.    Sherrill Cinderella Perry, MD  11/03/2024   This document may have been prepared by [facility] Voice Recognition software and as such may include unintentional dictation errors.

## 2024-11-04 NOTE — Telephone Encounter (Signed)
 Called patient to check in prior to surgery as patient had not yet read mychart message regarding surgery instructions. Resent surgery instructions via mychart per patient request. Patient to reach out if she has any additional questions or concerns.

## 2024-11-05 ENCOUNTER — Telehealth: Payer: Self-pay | Admitting: Neurosurgery

## 2024-11-05 MED ORDER — CHLORHEXIDINE GLUCONATE 0.12 % MT SOLN
15.0000 mL | Freq: Once | OROMUCOSAL | Status: AC
  Administered 2024-11-06: 15 mL via OROMUCOSAL

## 2024-11-05 MED ORDER — CEFAZOLIN IN SODIUM CHLORIDE 2-0.9 GM/100ML-% IV SOLN
2.0000 g | Freq: Once | INTRAVENOUS | Status: AC
  Administered 2024-11-06: 2 g via INTRAVENOUS
  Filled 2024-11-05: qty 100

## 2024-11-05 MED ORDER — VANCOMYCIN HCL IN DEXTROSE 1-5 GM/200ML-% IV SOLN
1000.0000 mg | Freq: Once | INTRAVENOUS | Status: AC
  Administered 2024-11-06: 1000 mg via INTRAVENOUS

## 2024-11-05 MED ORDER — ORAL CARE MOUTH RINSE: 15.0000 mL | Freq: Once | OROMUCOSAL | Status: AC

## 2024-11-05 MED ORDER — SODIUM CHLORIDE 0.9 % IV SOLN: INTRAVENOUS | Status: DC

## 2024-11-05 NOTE — Telephone Encounter (Signed)
 Called patient back regarding mychart message. She states that she is unable to log in to [employer]. Sent a link via email and text to reset password.

## 2024-11-05 NOTE — Telephone Encounter (Signed)
 Pt is calling in about her sx in the am, she said she received a call that she got a mychart message with instructions and it has not been received yet. Please advise.

## 2024-11-06 ENCOUNTER — Ambulatory Visit
Admission: RE | Admit: 2024-11-06 | Discharge: 2024-11-06 | Disposition: A | Attending: Neurosurgery | Admitting: Neurosurgery

## 2024-11-06 ENCOUNTER — Other Ambulatory Visit: Payer: Self-pay

## 2024-11-06 ENCOUNTER — Encounter: Payer: Self-pay | Admitting: Urgent Care

## 2024-11-06 ENCOUNTER — Encounter: Admission: RE | Disposition: A | Payer: Self-pay | Source: Home / Self Care | Attending: Neurosurgery

## 2024-11-06 ENCOUNTER — Ambulatory Visit: Admitting: Certified Registered"

## 2024-11-06 ENCOUNTER — Encounter: Payer: Self-pay | Admitting: Neurosurgery

## 2024-11-06 DIAGNOSIS — J449 Chronic obstructive pulmonary disease, unspecified: Secondary | ICD-10-CM | POA: Insufficient documentation

## 2024-11-06 DIAGNOSIS — Z452 Encounter for adjustment and management of vascular access device: Secondary | ICD-10-CM | POA: Diagnosis present

## 2024-11-06 DIAGNOSIS — I252 Old myocardial infarction: Secondary | ICD-10-CM | POA: Diagnosis not present

## 2024-11-06 DIAGNOSIS — M1990 Unspecified osteoarthritis, unspecified site: Secondary | ICD-10-CM | POA: Diagnosis not present

## 2024-11-06 DIAGNOSIS — T85192A Other mechanical complication of implanted electronic neurostimulator (electrode) of spinal cord, initial encounter: Secondary | ICD-10-CM | POA: Diagnosis not present

## 2024-11-06 DIAGNOSIS — Z01812 Encounter for preprocedural laboratory examination: Secondary | ICD-10-CM

## 2024-11-06 DIAGNOSIS — E119 Type 2 diabetes mellitus without complications: Secondary | ICD-10-CM | POA: Diagnosis not present

## 2024-11-06 DIAGNOSIS — Z9682 Presence of neurostimulator: Secondary | ICD-10-CM | POA: Insufficient documentation

## 2024-11-06 DIAGNOSIS — Z4542 Encounter for adjustment and management of neuropacemaker (brain) (peripheral nerve) (spinal cord): Secondary | ICD-10-CM | POA: Diagnosis not present

## 2024-11-06 DIAGNOSIS — Z01818 Encounter for other preprocedural examination: Secondary | ICD-10-CM

## 2024-11-06 DIAGNOSIS — I110 Hypertensive heart disease with heart failure: Secondary | ICD-10-CM | POA: Insufficient documentation

## 2024-11-06 DIAGNOSIS — F32A Depression, unspecified: Secondary | ICD-10-CM | POA: Diagnosis not present

## 2024-11-06 DIAGNOSIS — Z7985 Long-term (current) use of injectable non-insulin antidiabetic drugs: Secondary | ICD-10-CM | POA: Diagnosis not present

## 2024-11-06 DIAGNOSIS — I2510 Atherosclerotic heart disease of native coronary artery without angina pectoris: Secondary | ICD-10-CM | POA: Insufficient documentation

## 2024-11-06 DIAGNOSIS — Z7984 Long term (current) use of oral hypoglycemic drugs: Secondary | ICD-10-CM | POA: Diagnosis not present

## 2024-11-06 DIAGNOSIS — G4730 Sleep apnea, unspecified: Secondary | ICD-10-CM | POA: Insufficient documentation

## 2024-11-06 DIAGNOSIS — F419 Anxiety disorder, unspecified: Secondary | ICD-10-CM | POA: Diagnosis not present

## 2024-11-06 DIAGNOSIS — Z9981 Dependence on supplemental oxygen: Secondary | ICD-10-CM | POA: Diagnosis not present

## 2024-11-06 DIAGNOSIS — Z794 Long term (current) use of insulin: Secondary | ICD-10-CM | POA: Insufficient documentation

## 2024-11-06 DIAGNOSIS — Z8711 Personal history of peptic ulcer disease: Secondary | ICD-10-CM | POA: Diagnosis not present

## 2024-11-06 DIAGNOSIS — Z8673 Personal history of transient ischemic attack (TIA), and cerebral infarction without residual deficits: Secondary | ICD-10-CM | POA: Diagnosis not present

## 2024-11-06 DIAGNOSIS — I509 Heart failure, unspecified: Secondary | ICD-10-CM | POA: Insufficient documentation

## 2024-11-06 DIAGNOSIS — M961 Postlaminectomy syndrome, not elsewhere classified: Secondary | ICD-10-CM | POA: Diagnosis present

## 2024-11-06 DIAGNOSIS — G894 Chronic pain syndrome: Secondary | ICD-10-CM | POA: Diagnosis present

## 2024-11-06 DIAGNOSIS — K219 Gastro-esophageal reflux disease without esophagitis: Secondary | ICD-10-CM | POA: Insufficient documentation

## 2024-11-06 DIAGNOSIS — N289 Disorder of kidney and ureter, unspecified: Secondary | ICD-10-CM | POA: Insufficient documentation

## 2024-11-06 HISTORY — PX: SPINAL CORD STIMULATOR BATTERY EXCHANGE: SHX6202

## 2024-11-06 LAB — POCT I-STAT, CHEM 8
BUN: 31 mg/dL — ABNORMAL HIGH (ref 8–23)
Calcium, Ion: 1.05 mmol/L — ABNORMAL LOW (ref 1.15–1.40)
Chloride: 116 mmol/L — ABNORMAL HIGH (ref 98–111)
Creatinine, Ser: 1.1 mg/dL — ABNORMAL HIGH (ref 0.44–1.00)
Glucose, Bld: 176 mg/dL — ABNORMAL HIGH (ref 70–99)
HCT: 41 % (ref 36.0–46.0)
Hemoglobin: 13.9 g/dL (ref 12.0–15.0)
Potassium: 4.9 mmol/L (ref 3.5–5.1)
Sodium: 143 mmol/L (ref 135–145)
TCO2: 20 mmol/L — ABNORMAL LOW (ref 22–32)

## 2024-11-06 LAB — GLUCOSE, CAPILLARY: Glucose-Capillary: 208 mg/dL — ABNORMAL HIGH (ref 70–99)

## 2024-11-06 SURGERY — SPINAL CORD STIMULATOR BATTERY EXCHANGE
Anesthesia: General | Laterality: Right

## 2024-11-06 MED ORDER — 0.9 % SODIUM CHLORIDE (POUR BTL) OPTIME
TOPICAL | Status: DC | PRN
  Administered 2024-11-06: 150 mL

## 2024-11-06 MED ORDER — BUPIVACAINE LIPOSOME 1.3 % IJ SUSP
INTRAMUSCULAR | Status: AC
  Filled 2024-11-06: qty 20

## 2024-11-06 MED ORDER — LIDOCAINE HCL (CARDIAC) PF 100 MG/5ML IV SOSY
PREFILLED_SYRINGE | INTRAVENOUS | Status: DC | PRN
  Administered 2024-11-06: 80 mg via INTRAVENOUS

## 2024-11-06 MED ORDER — DEXMEDETOMIDINE HCL IN NACL 80 MCG/20ML IV SOLN
INTRAVENOUS | Status: AC
  Filled 2024-11-06: qty 20

## 2024-11-06 MED ORDER — SUGAMMADEX SODIUM 200 MG/2ML IV SOLN
INTRAVENOUS | Status: DC | PRN
  Administered 2024-11-06: 300 mg via INTRAVENOUS

## 2024-11-06 MED ORDER — CHLORHEXIDINE GLUCONATE 0.12 % MT SOLN
OROMUCOSAL | Status: AC
  Filled 2024-11-06: qty 15

## 2024-11-06 MED ORDER — ONDANSETRON HCL 4 MG/2ML IJ SOLN
INTRAMUSCULAR | Status: AC
  Filled 2024-11-06: qty 2

## 2024-11-06 MED ORDER — FENTANYL CITRATE (PF) 100 MCG/2ML IJ SOLN
INTRAMUSCULAR | Status: AC
  Filled 2024-11-06: qty 2

## 2024-11-06 MED ORDER — LIDOCAINE HCL (PF) 1 % IJ SOLN
INTRAMUSCULAR | Status: AC
  Filled 2024-11-06: qty 30

## 2024-11-06 MED ORDER — ONDANSETRON HCL 4 MG/2ML IJ SOLN
INTRAMUSCULAR | Status: DC | PRN
  Administered 2024-11-06: 4 mg via INTRAVENOUS

## 2024-11-06 MED ORDER — OXYCODONE HCL 5 MG PO TABS
ORAL_TABLET | ORAL | Status: AC
  Filled 2024-11-06: qty 1

## 2024-11-06 MED ORDER — PROPOFOL 10 MG/ML IV BOLUS
INTRAVENOUS | Status: AC
  Filled 2024-11-06: qty 20

## 2024-11-06 MED ORDER — LIDOCAINE HCL (PF) 2 % IJ SOLN
INTRAMUSCULAR | Status: AC
  Filled 2024-11-06: qty 5

## 2024-11-06 MED ORDER — FENTANYL CITRATE (PF) 100 MCG/2ML IJ SOLN
INTRAMUSCULAR | Status: DC | PRN
  Administered 2024-11-06 (×2): 50 ug via INTRAVENOUS

## 2024-11-06 MED ORDER — BUPIVACAINE-EPINEPHRINE (PF) 0.5% -1:200000 IJ SOLN
INTRAMUSCULAR | Status: DC | PRN
  Administered 2024-11-06: 10 mL

## 2024-11-06 MED ORDER — VANCOMYCIN HCL IN DEXTROSE 1-5 GM/200ML-% IV SOLN
INTRAVENOUS | Status: AC
  Filled 2024-11-06: qty 200

## 2024-11-06 MED ORDER — MIDAZOLAM HCL (PF) 2 MG/2ML IJ SOLN
INTRAMUSCULAR | Status: DC | PRN
  Administered 2024-11-06: 2 mg via INTRAVENOUS

## 2024-11-06 MED ORDER — BUPIVACAINE HCL (PF) 0.5 % IJ SOLN
INTRAMUSCULAR | Status: AC
  Filled 2024-11-06: qty 60

## 2024-11-06 MED ORDER — PROPOFOL 1000 MG/100ML IV EMUL
INTRAVENOUS | Status: AC
  Filled 2024-11-06: qty 100

## 2024-11-06 MED ORDER — OXYCODONE HCL 5 MG PO TABS
5.0000 mg | ORAL_TABLET | Freq: Once | ORAL | Status: AC
  Administered 2024-11-06: 5 mg via ORAL

## 2024-11-06 MED ORDER — OXYCODONE HCL 5 MG PO TABS
10.0000 mg | ORAL_TABLET | Freq: Three times a day (TID) | ORAL | 0 refills | Status: DC | PRN
  Filled 2024-11-06: qty 42, 7d supply, fill #0

## 2024-11-06 MED ORDER — CEFAZOLIN SODIUM-DEXTROSE 2-4 GM/100ML-% IV SOLN
INTRAVENOUS | Status: AC
  Filled 2024-11-06: qty 100

## 2024-11-06 MED ORDER — ROCURONIUM BROMIDE 100 MG/10ML IV SOLN
INTRAVENOUS | Status: DC | PRN
  Administered 2024-11-06: 50 mg via INTRAVENOUS

## 2024-11-06 MED ORDER — METHYLPREDNISOLONE ACETATE 40 MG/ML IJ SUSP
INTRAMUSCULAR | Status: AC
  Filled 2024-11-06: qty 1

## 2024-11-06 MED ORDER — DEXAMETHASONE SOD PHOSPHATE PF 10 MG/ML IJ SOLN
INTRAMUSCULAR | Status: DC | PRN
  Administered 2024-11-06: 5 mg via INTRAVENOUS

## 2024-11-06 MED ORDER — SODIUM CHLORIDE (PF) 0.9 % IJ SOLN
INTRAMUSCULAR | Status: AC
  Filled 2024-11-06: qty 20

## 2024-11-06 MED ORDER — BUPIVACAINE-EPINEPHRINE (PF) 0.5% -1:200000 IJ SOLN
INTRAMUSCULAR | Status: AC
  Filled 2024-11-06: qty 30

## 2024-11-06 MED ORDER — PROPOFOL 10 MG/ML IV BOLUS
INTRAVENOUS | Status: DC | PRN
  Administered 2024-11-06: 160 mg via INTRAVENOUS

## 2024-11-06 MED ORDER — MIDAZOLAM HCL 2 MG/2ML IJ SOLN
INTRAMUSCULAR | Status: AC
  Filled 2024-11-06: qty 2

## 2024-11-06 MED ORDER — ROCURONIUM BROMIDE 10 MG/ML (PF) SYRINGE
PREFILLED_SYRINGE | INTRAVENOUS | Status: AC
  Filled 2024-11-06: qty 10

## 2024-11-06 SURGICAL SUPPLY — 30 items
BRUSH SCRUB EZ 4% CHG (MISCELLANEOUS) ×1 IMPLANT
CONTROLLER MAGNET PT W/MANUAL (MISCELLANEOUS) IMPLANT
CONTROLLER NEUROSTIM PATIENT (NEUROSURGERY SUPPLIES) IMPLANT
DERMABOND ADVANCED .7 DNX12 (GAUZE/BANDAGES/DRESSINGS) ×2 IMPLANT
DRAPE LAPAROTOMY 100X77 ABD (DRAPES) ×1 IMPLANT
DRSG OPSITE POSTOP 4X8 (GAUZE/BANDAGES/DRESSINGS) IMPLANT
ELECTRODE REM PT RTRN 9FT ADLT (ELECTROSURGICAL) ×1 IMPLANT
GENERATOR PROCLAIM PLUS 5 (Neuro Prosthesis/Implant) IMPLANT
GLOVE BIOGEL PI IND STRL 6.5 (GLOVE) IMPLANT
GLOVE BIOGEL PI IND STRL 7.0 (GLOVE) ×1 IMPLANT
GLOVE BIOGEL PI IND STRL 8 (GLOVE) ×1 IMPLANT
GLOVE SRG 8 PF TXTR STRL LF DI (GLOVE) ×1 IMPLANT
GLOVE SURG SYN 6.5 PF PI (GLOVE) IMPLANT
GLOVE SURG SYN 7.0 PF PI (GLOVE) ×1 IMPLANT
GLOVE SURG SYN 7.5 PF PI (GLOVE) ×1 IMPLANT
GOWN SRG LRG LVL 4 IMPRV REINF (GOWNS) ×1 IMPLANT
GOWN SRG XL LVL 3 NONREINFORCE (GOWNS) ×1 IMPLANT
KIT SPINAL PRONEVIEW (KITS) ×1 IMPLANT
MANIFOLD NEPTUNE II (INSTRUMENTS) ×1 IMPLANT
NDL SAFETY ECLIP 18X1.5 (MISCELLANEOUS) ×1 IMPLANT
NS IRRIG 500ML POUR BTL (IV SOLUTION) ×1 IMPLANT
PACK LAMINECTOMY ARMC (PACKS) ×1 IMPLANT
PAD ARMBOARD POSITIONER FOAM (MISCELLANEOUS) ×1 IMPLANT
STAPLER SKIN PROX 35W (STAPLE) IMPLANT
SURGIFLO W/THROMBIN 8M KIT (HEMOSTASIS) ×1 IMPLANT
SUT STRATA 3-0 15 PS-2 (SUTURE) IMPLANT
SUT VIC AB 0 CT1 18XCR BRD 8 (SUTURE) ×2 IMPLANT
SUT VIC AB 2-0 CT1 18 (SUTURE) ×2 IMPLANT
SYR 30ML LL (SYRINGE) ×2 IMPLANT
TRAP FLUID SMOKE EVACUATOR (MISCELLANEOUS) ×1 IMPLANT

## 2024-11-06 NOTE — Anesthesia Postprocedure Evaluation (Signed)
 Anesthesia Post Note  Patient: Afomia Blackley  Procedure(s) Performed: Spinal cord stimulator battery exchange, right sided. (Right)  Patient location during evaluation: PACU Anesthesia Type: General Level of consciousness: awake and alert Pain management: pain level controlled Vital Signs Assessment: post-procedure vital signs reviewed and stable Respiratory status: spontaneous breathing, nonlabored ventilation, respiratory function stable and patient connected to nasal cannula oxygen  Cardiovascular status: blood pressure returned to baseline and stable Postop Assessment: no apparent nausea or vomiting Anesthetic complications: no   No notable events documented.   Last Vitals:  Vitals:   11/06/24 0855 11/06/24 0858  BP: (!) 171/91 (!) 178/92  Pulse: 69 81  Resp:  17  Temp: (!) 36.1 C 36.5 C  SpO2: 97% 94%    Last Pain:  Vitals:   11/06/24 0858  TempSrc: Temporal  PainSc: 0-No pain                 Prentice Murphy

## 2024-11-06 NOTE — Op Note (Signed)
 Indications: the patient is a 63 yo female who was diagnosed with failed back syndrome, spinal cord stimulator malfunction due to battery at the end of the last. The patient had a successful trial for spinal cord stimulation, so was consented for placement of a permanent device   Findings: successful placement of a replacement spinal cord stimulator IPG.    Preoperative Diagnosis: Spinal cord stimulator battery end-of-life  Postoperative Diagnosis: Spinal cord stimulator battery end-of-life     EBL: Minimal IVF: see anesthesia record Drains: none Disposition: Extubated and Stable to PACU Complications: none   No foley catheter was placed.     Preoperative Note:    Risks of surgery discussed in clinic.   Operative Note:      The patient was then brought from the preoperative center with intravenous access established.  The patient underwent general anesthesia and endotracheal tube intubation, then was rotated on the [facility] table where all pressure points were appropriately padded. Perioperative antibiotic prophylaxis was administered.  Sterile prep and drapes were then applied and a timeout was then observed.       The incision on the flank was then opened and a pocket formed until it was large enough for the removal of the pulse generator.  Skin and subcutaneous tissues were carefully dissected until we came down to the pocket.  The pocket was opened sharply with care taken not to violate the leads.  We are able to find the IPG which was intact with no evidence of lead breakdown.  This was removed.    The pocket was much too large for the stimulator itself and caused her to have significant amount of translation within the wound so this was marsupialized and a new pocket was created above this.    The new IPG was connected and interrogated with good signaling.  Plan was made to go forward with implantation into the new pocket.  The IPG was placed into the pocket and sutured into  place through the suture anchoring holes.  We then closed the new pocket over top of the IPG.  We then closed in multiple layers after significant amount of irrigation and hemostasis.    Patient was then rotated back to the preoperative bed awakened from anesthesia and taken to recovery. All counts are correct in this case.   I performed the procedure without an assistant surgeon   Penne MICAEL Sharps, MD

## 2024-11-06 NOTE — Interval H&P Note (Signed)
 History and Physical Interval Note:  11/06/2024 6:58 AM  Lindsey [name]  has presented today for surgery, with the diagnosis of Spinal cord stimulator battery end-of-life.  The various methods of treatment have been discussed with the patient and family. After consideration of risks, benefits and other options for treatment, the patient has consented to  Procedures: Spinal cord stimulator battery exchange, right sided. (Right) as a surgical intervention.  The patient's history has been reviewed, patient examined, no change in status, stable for surgery.  I have reviewed the patient's chart and labs.  Questions were answered to the patient's satisfaction.    Heart and Lungs Clear  [name]

## 2024-11-06 NOTE — Discharge Instructions (Addendum)
 Your surgeon has performed an procedure implanting a Battery for Spinal Cord Stimulator Alone. This involved making an incision in the Flank.   The following are instructions to help in your recovery once you have been discharged from the hospital. Even if you feel well, it is important that you follow these activity guidelines. If you do not let your body heal properly from the surgery, you can increase the chance of return of your symptoms and other complications.  You will be working with your spinal cord stimulator team for programming and titrations.  Most patients require multiple titrations before we get the settings just right.  We appreciate your patience and working with [place] to optimize your care.  *NSAIDs are okay to take after surgery  Activity    No bending, lifting, or twisting ("BLT"). Avoid lifting objects heavier than 10 pounds (gallon milk jug).  Where possible, avoid household activities that involve lifting, bending, reaching, pushing, or pulling such as laundry, vacuuming, grocery shopping, and childcare. Try to arrange for help from friends and family for these activities while your back heals.  Increase physical activity slowly as tolerated.  Taking short walks is encouraged, but avoid strenuous exercise. Do not jog, run, bicycle, lift weights, or participate in any other exercises unless specifically allowed by your doctor.  Talk to your doctor before resuming sexual activity.  You should not drive until cleared by your doctor.  Until released by your doctor, you should not return to work or school.  You should rest at home and let your body heal.   You may shower three days after your surgery.  After showering, lightly dab your incision dry. Do not take a tub bath or go swimming until approved by your doctor at your follow-up appointment.  If you smoke, we strongly recommend that you quit.  Smoking has been proven to interfere with normal bone healing and will dramatically  reduce the success rate of your surgery. Please contact [facility] (800-QUIT-NOW) and use the resources at www.[facility].com for assistance in stopping smoking.  Surgical Incision   If you have a dressing on your incision, you may remove it 3 days after your surgery. Keep your incision area clean and dry.  Your stitches are under your skin and dissolvable.  They are covered by surgical glue.  The glue should begin to peel away within about a week.  Diet           You may return to your usual diet. Be sure to stay hydrated.  When to Contact us  You may experience pain in your neck and/or pain between your shoulder blades, or at your back depending on your incision sites.. This is normal and should improve in the next few weeks with the help of pain medication, muscle relaxers, and rest. Some patients report that a warm compress on the back of the neck or between the shoulder blades helps.  However, should you experience any of the following, contact us immediately: New numbness or weakness Pain that is progressively getting worse, and is not relieved by your pain medication, muscle relaxers, rest, and warm compresses Bleeding, redness, swelling, pain, or drainage from surgical incision Chills or flu-like symptoms Fever greater than 101.0 F (38.3 C) Inability to eat, drink fluids, or take medications Problems with bowel or bladder functions Difficulty breathing or shortness of breath Warmth, tenderness, or swelling in your calf Contact Information How to contact us:  If you have any questions/concerns before or after surgery, you  can reach [place] at [phone], or you can send a mychart message. We can be reached by phone or mychart 8am-4pm, Monday-Friday.  *Please note: Calls after 4pm are forwarded to a third party answering service. Mychart messages are not routinely monitored during evenings, weekends, and holidays. Please call our office to contact the answering service for urgent  concerns during non-business hours.

## 2024-11-06 NOTE — Anesthesia Procedure Notes (Signed)
 Procedure Name: Intubation Date/Time: 11/06/2024 7:26 AM  Performed by: Lennie Lamarr HERO, CRNAPre-anesthesia Checklist: Patient identified, Emergency Drugs available, Suction available and Patient being monitored Patient Re-evaluated:Patient Re-evaluated prior to induction Oxygen  Delivery Method: Circle System Utilized Preoxygenation: Pre-oxygenation with 100% oxygen  Induction Type: IV induction Ventilation: Mask ventilation without difficulty Laryngoscope Size: McGrath and 4 Grade View: Grade I Tube type: Oral Tube size: 7.0 mm Number of attempts: 1 Airway Equipment and Method: Stylet and Oral airway Placement Confirmation: ETT inserted through vocal cords under direct vision, positive ETCO2 and breath sounds checked- equal and bilateral Secured at: 21 cm Tube secured with: Tape Dental Injury: Teeth and Oropharynx as per pre-operative assessment

## 2024-11-06 NOTE — Transfer of Care (Signed)
 Immediate Anesthesia Transfer of Care Note  Patient: Lindsey [name]  Procedure(s) Performed: Spinal cord stimulator battery exchange, right sided. (Right)  Patient Location: PACU  Anesthesia Type:General  Level of Consciousness: drowsy and patient cooperative  Airway & Oxygen  Therapy: Patient Spontanous Breathing and Patient connected to face mask oxygen   Post-op Assessment: Report given to RN, Post -op Vital signs reviewed and stable, and Patient moving all extremities X 4  Post vital signs: Reviewed and stable  Last Vitals:  Vitals Value Taken Time  BP 172/108 11/06/24 08:21  Temp    Pulse 103 11/06/24 08:23  Resp 18 11/06/24 08:23  SpO2 97 % 11/06/24 08:23  Vitals shown include unfiled device data.  Last Pain:  Vitals:   11/06/24 0644  TempSrc: Oral  PainSc: 10-Worst pain ever         Complications: No notable events documented.

## 2024-11-06 NOTE — Anesthesia Preprocedure Evaluation (Signed)
 Anesthesia Evaluation  Patient identified by MRN, date of birth, ID band Patient awake    Reviewed: Allergy & Precautions, H&P , NPO status , Patient's Chart, lab work & pertinent test results  Airway Mallampati: II  TM Distance: >3 FB Neck ROM: full    Dental  (+) Edentulous Lower, Edentulous Upper, Dental Advidsory Given   Pulmonary shortness of breath, sleep apnea , COPD (2L [place]),  oxygen  dependent, neg recent URI   Pulmonary exam normal        Cardiovascular hypertension, (-) angina + CAD, + Past MI (2002) and +CHF  (-) Cardiac Stents Normal cardiovascular exam(-) dysrhythmias (-) Valvular Problems/Murmurs  2022 echo: EF normal and diastolic function indeterminate   Neuro/Psych  Headaches, Seizures -, Well Controlled,  PSYCHIATRIC DISORDERS Anxiety Depression    CVA (Left arm weakness), Residual Symptoms    GI/Hepatic Neg liver ROS, PUD,GERD  ,,  Endo/Other  diabetes, Type 2    Renal/GU Renal InsufficiencyRenal disease     Musculoskeletal  (+) Arthritis ,    Abdominal  (+) + obese  Peds  Hematology negative hematology ROS (+)   Anesthesia Other Findings Past Medical History: No date: Allergy No date: Anemia No date: Arthritis     Comment:  knees No date: CHF (congestive heart failure) (HCC) No date: COPD (chronic obstructive pulmonary disease) (HCC) No date: Diabetes mellitus without complication (HCC)     Comment:  diet controlled No date: GERD (gastroesophageal reflux disease) 2002: Heart attack (HCC) No date: Hyperlipidemia No date: Hypertension No date: PUD (peptic ulcer disease) No date: Sleep apnea     Comment:  can't tolerate CPAP.  Uses O2 only No date: Spinal cord stimulator status     Comment:  for lower back pain 2004: Stroke (HCC)     Comment:  mini-stroke No date: Vertigo No date: Wears dentures     Comment:  full upper and lower  Past Surgical History: No date: ABDOMINAL  HYSTERECTOMY No date: back surgery No date: broken wrist 01/30/2017: COLONOSCOPY WITH PROPOFOL ; N/A     Comment:  Procedure: COLONOSCOPY WITH PROPOFOL ;  Surgeon: Ruel Kung, MD;  Location: [facility] ENDOSCOPY;  Service: Endoscopy;              Laterality: N/A; 02/20/2017: COLONOSCOPY WITH PROPOFOL ; N/A     Comment:  Procedure: Colonoscopy with propofol  ;  Surgeon: Ruel Kung, MD;  Location: [facility] ENDOSCOPY;  Service: Endoscopy;              Laterality: N/A; 03/13/2017: COLONOSCOPY WITH PROPOFOL ; N/A     Comment:  Procedure: COLONOSCOPY WITH PROPOFOL ;  Surgeon: Ruel Kung, MD;  Location: [facility] ENDOSCOPY;  Service: Endoscopy;              Laterality: N/A; 04/17/2017: COLONOSCOPY WITH PROPOFOL ; N/A     Comment:  Procedure: COLONOSCOPY WITH PROPOFOL ;  Surgeon: Kung Ruel, MD;  Location: [facility] ENDOSCOPY;  Service:               Endoscopy;  Laterality: N/A; 05/05/2020: ESOPHAGOGASTRODUODENOSCOPY; N/A     Comment:  Procedure: ESOPHAGOGASTRODUODENOSCOPY (EGD);  Surgeon:  Toledo, Ladell POUR, MD;  Location: [facility] ENDOSCOPY;                Service: Gastroenterology;  Laterality: N/A; 09/28/2016: ESOPHAGOGASTRODUODENOSCOPY (EGD) WITH PROPOFOL ; N/A     Comment:  Procedure: ESOPHAGOGASTRODUODENOSCOPY (EGD) WITH               PROPOFOL ;  Surgeon: Lamar ONEIDA Holmes, MD;  Location: [facility]              ENDOSCOPY;  Service: Endoscopy;  Laterality: N/A; 03/13/2017: ESOPHAGOGASTRODUODENOSCOPY (EGD) WITH PROPOFOL ; N/A     Comment:  Procedure: ESOPHAGOGASTRODUODENOSCOPY (EGD) WITH               PROPOFOL ;  Surgeon: Ruel Kung, MD;  Location: [facility]               ENDOSCOPY;  Service: Endoscopy;  Laterality: N/A; No date: GALLBLADDER SURGERY No date: GASTRIC BYPASS 03/13/2017: GIVENS CAPSULE STUDY; N/A     Comment:  Procedure: GIVENS CAPSULE STUDY;  Surgeon: Ruel Kung,               MD;  Location: [facility] ENDOSCOPY;  Service: Endoscopy;                Laterality:  N/A; No date: HERNIA REPAIR 1995: JOINT REPLACEMENT No date: REPLACEMENT TOTAL KNEE No date: ROTATOR CUFF REPAIR  BMI    Body Mass Index: 39.16 kg/m      Reproductive/Obstetrics negative OB ROS                              Anesthesia Physical Anesthesia Plan  ASA: 3  Anesthesia Plan: General   Post-op Pain Management: Minimal or no pain anticipated   Induction: Intravenous  PONV Risk Score and Plan: 2 and Ondansetron   Airway Management Planned: Oral ETT  Additional Equipment:   Intra-op Plan:   Post-operative Plan: Extubation in OR  Informed Consent: I have reviewed the patients History and Physical, chart, labs and discussed the procedure including the risks, benefits and alternatives for the proposed anesthesia with the patient or authorized representative who has indicated his/her understanding and acceptance.     Dental Advisory Given  Plan Discussed with: CRNA and Surgeon  Anesthesia Plan Comments:          Anesthesia Quick Evaluation

## 2024-11-07 ENCOUNTER — Telehealth: Payer: Self-pay | Admitting: Neurosurgery

## 2024-11-07 ENCOUNTER — Encounter: Payer: Self-pay | Admitting: Neurosurgery

## 2024-11-07 NOTE — Telephone Encounter (Signed)
 error

## 2024-11-10 ENCOUNTER — Other Ambulatory Visit: Payer: Self-pay | Admitting: Internal Medicine

## 2024-11-10 DIAGNOSIS — E669 Obesity, unspecified: Secondary | ICD-10-CM

## 2024-11-10 DIAGNOSIS — N1831 Chronic kidney disease, stage 3a: Secondary | ICD-10-CM

## 2024-11-17 ENCOUNTER — Other Ambulatory Visit: Payer: Self-pay

## 2024-11-17 DIAGNOSIS — G8929 Other chronic pain: Secondary | ICD-10-CM

## 2024-11-24 ENCOUNTER — Encounter: Payer: Self-pay | Admitting: Neurosurgery

## 2024-11-24 ENCOUNTER — Ambulatory Visit: Admitting: Neurosurgery

## 2024-11-24 VITALS — BP 130/82 | Temp 99.6°F | Ht 67.0 in | Wt 241.0 lb

## 2024-11-24 DIAGNOSIS — G894 Chronic pain syndrome: Secondary | ICD-10-CM | POA: Diagnosis not present

## 2024-11-24 DIAGNOSIS — M961 Postlaminectomy syndrome, not elsewhere classified: Secondary | ICD-10-CM

## 2024-11-24 DIAGNOSIS — T85192A Other mechanical complication of implanted electronic neurostimulator (electrode) of spinal cord, initial encounter: Secondary | ICD-10-CM

## 2024-11-24 DIAGNOSIS — G8929 Other chronic pain: Secondary | ICD-10-CM

## 2024-11-24 MED ORDER — OXYCODONE HCL 5 MG PO TABS: 10.0000 mg | ORAL_TABLET | Freq: Three times a day (TID) | ORAL | 0 refills | Status: DC | PRN

## 2024-11-24 NOTE — Progress Notes (Signed)
" ° °  HISTORY OF PRESENT ILLNESS: Discussed the use of AI scribe software for clinical note transcription with the patient, who gave verbal consent to proceed.  History of Present Illness Lindsey [name] is a 63 year old female with chronic back pain and recent spinal cord stimulator battery replacement who presents for postoperative device management and ongoing pain.  She is in the early postoperative period after spinal cord stimulator battery exchange. She is ambulatory and avoiding bending and lifting but still has significant back pain.  She had good pain relief with the short course of additional postoperative pain medication, which she has finished. She continues oxycodone  through her pain provider and notes better control when the extra medication was available.      PHYSICAL EXAMINATION:   Vitals:   11/24/24 0859  BP: 130/82  Temp: 99.6 F (37.6 C)   General: Patient is well developed, well nourished, calm, collected, and in no apparent distress.  NEUROLOGICAL:  General: In no acute distress.  Awake, alert, oriented to person, place, and time. Pupils equal round and reactive to light.   Incision c/d/I, some glue remaining  ROS (Neurologic): Negative except as noted above  IMAGING: No interval imaging to review   ASSESSMENT/PLAN:  Assessment and Plan Assessment & Plan Spinal cord stimulator management/Chronic Pain Syndrome Early postoperative period following spinal cord stimulator battery replacement. Incision is healing without complication. Reprogramming is required to optimize analgesia. - Reprogrammed spinal cord stimulator with adjustments to amplitude, laterality, and addition of new programs to optimize analgesia. - Provided education on device adjustment and instructions for contacting the clinic for further programming needs. - Scheduled follow-up in one month to reassess pain control and stimulator function.   Penne MICAEL Sharps, MD/MS Department of  Neurosurgery  "

## 2024-12-01 ENCOUNTER — Other Ambulatory Visit

## 2024-12-01 DIAGNOSIS — G8929 Other chronic pain: Secondary | ICD-10-CM

## 2024-12-02 ENCOUNTER — Ambulatory Visit: Admitting: Internal Medicine

## 2024-12-02 VITALS — BP 126/84 | HR 81 | Temp 97.9°F | Ht 67.0 in | Wt 263.4 lb

## 2024-12-02 DIAGNOSIS — E782 Mixed hyperlipidemia: Secondary | ICD-10-CM | POA: Diagnosis not present

## 2024-12-02 DIAGNOSIS — Z794 Long term (current) use of insulin: Secondary | ICD-10-CM

## 2024-12-02 DIAGNOSIS — J301 Allergic rhinitis due to pollen: Secondary | ICD-10-CM

## 2024-12-02 DIAGNOSIS — N1831 Chronic kidney disease, stage 3a: Secondary | ICD-10-CM

## 2024-12-02 DIAGNOSIS — E1122 Type 2 diabetes mellitus with diabetic chronic kidney disease: Secondary | ICD-10-CM

## 2024-12-02 DIAGNOSIS — I10 Essential (primary) hypertension: Secondary | ICD-10-CM

## 2024-12-02 DIAGNOSIS — E669 Obesity, unspecified: Secondary | ICD-10-CM | POA: Diagnosis not present

## 2024-12-02 DIAGNOSIS — G8929 Other chronic pain: Secondary | ICD-10-CM | POA: Diagnosis not present

## 2024-12-02 MED ORDER — OXYCODONE HCL 20 MG PO TABS: 20.0000 mg | ORAL_TABLET | Freq: Four times a day (QID) | ORAL | 0 refills | Status: DC

## 2024-12-02 MED ORDER — AZELASTINE HCL 137 MCG/SPRAY NA SOLN: 1.0000 | Freq: Every day | NASAL | 2 refills | Status: AC

## 2024-12-02 MED ORDER — POTASSIUM CHLORIDE ER 10 MEQ PO TBCR: 10.0000 meq | EXTENDED_RELEASE_TABLET | ORAL | 1 refills | Status: AC

## 2024-12-02 NOTE — Progress Notes (Signed)
 "  Established Patient Office Visit  Subjective:  Patient ID: Lindsey [name], female    DOB: 1961-01-09  Age: 64 y.o. MRN: [id number]  Chief Complaint  Patient presents with   Pain Management    PM    Here for pain management follow up. Chronic pain well controlled on current analgesia. Last drug screen satisfactory and pill counts have also been satisfactory. Pain contract signed.     No other concerns at this time.   Past Medical History:  Diagnosis Date   Allergy    Anemia    Anxiety    Arthritis    knees   Battery end of life of spinal cord stimulator    CHF (congestive heart failure) (HCC)    COPD (chronic obstructive pulmonary disease) (HCC)    Depression    DM (diabetes mellitus), type 2 (HCC)    GERD (gastroesophageal reflux disease)    Heart attack (HCC) 2002   Hyperlipidemia    Hypertension    Insomnia    PUD (peptic ulcer disease)    Pulmonary embolism (HCC)    Sleep apnea    can't tolerate CPAP.  Uses O2 only   Spinal cord stimulator status    for lower back pain   Stroke [facility]) 2004   mini-stroke-left sided weakness   Vertigo    Wears dentures    full upper and lower    Past Surgical History:  Procedure Laterality Date   ABDOMINAL HYSTERECTOMY     back surgery     broken wrist     COLONOSCOPY WITH PROPOFOL  N/A 01/30/2017   Procedure: COLONOSCOPY WITH PROPOFOL ;  Surgeon: Ruel Kung, MD;  Location: [facility] ENDOSCOPY;  Service: Endoscopy;  Laterality: N/A;   COLONOSCOPY WITH PROPOFOL  N/A 02/20/2017   Procedure: Colonoscopy with propofol  ;  Surgeon: Ruel Kung, MD;  Location: [facility] ENDOSCOPY;  Service: Endoscopy;  Laterality: N/A;   COLONOSCOPY WITH PROPOFOL  N/A 03/13/2017   Procedure: COLONOSCOPY WITH PROPOFOL ;  Surgeon: Ruel Kung, MD;  Location: [facility] ENDOSCOPY;  Service: Endoscopy;  Laterality: N/A;   COLONOSCOPY WITH PROPOFOL  N/A 04/17/2017   Procedure: COLONOSCOPY WITH PROPOFOL ;  Surgeon: Kung Ruel, MD;  Location: [facility] ENDOSCOPY;  Service:  Endoscopy;  Laterality: N/A;   ESOPHAGOGASTRODUODENOSCOPY N/A 05/05/2020   Procedure: ESOPHAGOGASTRODUODENOSCOPY (EGD);  Surgeon: Toledo, Ladell POUR, MD;  Location: [facility] ENDOSCOPY;  Service: Gastroenterology;  Laterality: N/A;   ESOPHAGOGASTRODUODENOSCOPY (EGD) WITH PROPOFOL  N/A 09/28/2016   Procedure: ESOPHAGOGASTRODUODENOSCOPY (EGD) WITH PROPOFOL ;  Surgeon: Lamar ONEIDA Holmes, MD;  Location: [facility] ENDOSCOPY;  Service: Endoscopy;  Laterality: N/A;   ESOPHAGOGASTRODUODENOSCOPY (EGD) WITH PROPOFOL  N/A 03/13/2017   Procedure: ESOPHAGOGASTRODUODENOSCOPY (EGD) WITH PROPOFOL ;  Surgeon: Ruel Kung, MD;  Location: [facility] ENDOSCOPY;  Service: Endoscopy;  Laterality: N/A;   ESOPHAGOGASTRODUODENOSCOPY (EGD) WITH PROPOFOL  N/A 01/07/2024   Procedure: ESOPHAGOGASTRODUODENOSCOPY (EGD) WITH PROPOFOL ;  Surgeon: Jinny Carmine, MD;  Location: [facility] ENDOSCOPY;  Service: Endoscopy;  Laterality: N/A;   GALLBLADDER SURGERY     GASTRIC BYPASS     GIVENS CAPSULE STUDY N/A 03/13/2017   Procedure: GIVENS CAPSULE STUDY;  Surgeon: Ruel Kung, MD;  Location: [facility] ENDOSCOPY;  Service: Endoscopy;  Laterality: N/A;   HERNIA REPAIR     JOINT REPLACEMENT  1995   REPLACEMENT TOTAL KNEE     ROTATOR CUFF REPAIR     SPINAL CORD STIMULATOR BATTERY EXCHANGE Right 11/06/2024   Procedure: Spinal cord stimulator battery exchange, right sided.;  Surgeon: Claudene Penne ORN, MD;  Location: [facility] ORS;  Service: Neurosurgery;  Laterality: Right;  SPINAL CORD STIMULATOR INSERTION      Social History   Socioeconomic History   Marital status: Widowed    Spouse name: Not on file   Number of children: Not on file   Years of education: Not on file   Highest education level: Not on file  Occupational History   Occupation: disabled  Tobacco Use   Smoking status: Never   Smokeless tobacco: Never  Vaping Use   Vaping status: Never Used  Substance and Sexual Activity   Alcohol use: No    Alcohol/week: 0.0 standard drinks of alcohol   Drug use:  No   Sexual activity: Yes  Other Topics Concern   Not on file  Social History Narrative   Not on file   Social Drivers of Health   Tobacco Use: Low Risk (11/24/2024)   Patient History    Smoking Tobacco Use: Never    Smokeless Tobacco Use: Never    Passive Exposure: Not on file  Financial Resource Strain: Low Risk (07/20/2022)   Received from [facility]   Overall Financial Resource Strain (CARDIA)    Difficulty of Paying Living Expenses: Not hard at all  Food Insecurity: No Food Insecurity (07/20/2022)   [facility]   Epic    Within the past 12 months, you worried that your food would run out before you got the money to buy more.: Never true    Within the past 12 months, the food you bought just didn't last and you didn't have money to get more.: Never true  Transportation Needs: No Transportation Needs (07/20/2022)   Received from [facility]   PRAPARE - Transportation    Lack of Transportation (Medical): No    Lack of Transportation (Non-Medical): No  Physical Activity: Not on file  Stress: Not on file  Social Connections: Not on file  Intimate Partner Violence: Not on file  Depression (PHQ2-9): Medium Risk (11/01/2023)   Depression (PHQ2-9)    PHQ-2 Score: 6  Alcohol Screen: Not on file  Housing: Not on file  Utilities: Not At Risk (10/29/2023)   [facility] Utilities    Threatened with loss of utilities: No  Health Literacy: Not on file    Family History  Problem Relation Age of Onset   Hypertension Father    COPD Father    Heart disease Father    Breast cancer Neg Hx     Allergies[1]  Show/hide medication list[2]  Review of Systems  Constitutional:  Negative for weight loss.  HENT:  Negative for congestion.   Eyes: Negative.   Respiratory:  Negative for cough.   Cardiovascular: Negative.   Gastrointestinal: Negative.   Genitourinary: Negative.   Musculoskeletal:  Positive for back pain.  Skin: Negative.   Neurological: Negative.    Endo/Heme/Allergies: Negative.   Psychiatric/Behavioral:  The patient has insomnia.        Objective:   BP 126/84   Pulse 81   Temp 97.9 F (36.6 C)   Ht 5' 7 (1.702 m)   Wt 263 lb 6.4 oz (119.5 kg)   SpO2 97%   BMI 41.25 kg/m   Vitals:   12/02/24 0956  BP: 126/84  Pulse: 81  Temp: 97.9 F (36.6 C)  Height: 5' 7 (1.702 m)  Weight: 263 lb 6.4 oz (119.5 kg)  SpO2: 97%  BMI (Calculated): 41.24    Physical Exam Vitals reviewed.  Constitutional:      General: She is not in acute distress.  Appearance: She is obese.  HENT:     Head: Normocephalic.     Nose: Nose normal.     Mouth/Throat:     Mouth: Mucous membranes are moist.  Eyes:     Extraocular Movements: Extraocular movements intact.     Pupils: Pupils are equal, round, and reactive to light.  Cardiovascular:     Rate and Rhythm: Normal rate and regular rhythm.     Heart sounds: No murmur heard. Pulmonary:     Effort: Pulmonary effort is normal.     Breath sounds: No rhonchi or rales.  Abdominal:     General: Abdomen is flat.     Palpations: There is no hepatomegaly, splenomegaly or mass.  Musculoskeletal:        General: Normal range of motion.     Cervical back: Normal range of motion. No tenderness.  Skin:    General: Skin is warm and dry.  Neurological:     General: No focal deficit present.     Mental Status: She is alert and oriented to person, place, and time.     Cranial Nerves: No cranial nerve deficit.     Motor: No weakness.  Psychiatric:        Mood and Affect: Mood normal.        Behavior: Behavior normal.      No results found for any visits on 12/02/24.  Recent Results (from the past 2160 hours)  POCT CBG (Fasting - Glucose)     Status: Abnormal   Collection Time: 09/17/24  9:18 AM  Result Value Ref Range   Glucose Fasting, POC 226 (A) 70 - 99 mg/dL  FIB-4 w/Rx NASH FibroSure Plus     Status: None   Collection Time: 09/17/24  9:51 AM  Result Value Ref Range   AST 23 0  - 40 IU/L   ALT 21 0 - 32 IU/L   Platelets 347 150 - 450 x10E3/uL   FIB-4 Index 0.94 0.00 - 2.67    Comment:       0.00 - 1.29 Low risk for advanced liver fibrosis       1.30 - 2.67 Indeterminate risk for advanced liver                   fibrosis             >2.67 High risk for advanced fibrosis and                   for the development of other liver                   related events   Surgical pcr screen     Status: None   Collection Time: 10/30/24  9:06 AM   Specimen: Nasal Mucosa; Nasal Swab  Result Value Ref Range   MRSA, PCR NEGATIVE NEGATIVE   Staphylococcus aureus NEGATIVE NEGATIVE    Comment: (NOTE) The Xpert SA Assay (FDA approved for NASAL specimens in patients 57 years of age and older), is one component of a comprehensive surveillance program. It is not intended to diagnose infection nor to guide or monitor treatment. Performed at [facility], [address] [place], [place] [address]   I-STAT, [place] [address]     Status: Abnormal   Collection Time: 11/06/24  7:09 AM  Result Value Ref Range   Sodium 143 135 - 145 mmol/L   Potassium 4.9 3.5 - 5.1 mmol/L   Chloride 116 (H)  98 - 111 mmol/L   BUN 31 (H) 8 - 23 mg/dL   Creatinine, Ser 8.89 (H) 0.44 - 1.00 mg/dL   Glucose, Bld 823 (H) 70 - 99 mg/dL    Comment: Glucose reference range applies only to samples taken after fasting for at least 8 hours.   Calcium , Ion 1.05 (L) 1.15 - 1.40 mmol/L   TCO2 20 (L) 22 - 32 mmol/L   Hemoglobin 13.9 12.0 - 15.0 g/dL   HCT 58.9 63.9 - 53.9 %  Glucose, capillary     Status: Abnormal   Collection Time: 11/06/24  8:22 AM  Result Value Ref Range   Glucose-Capillary 208 (H) 70 - 99 mg/dL    Comment: Glucose reference range applies only to samples taken after fasting for at least 8 hours.      Assessment & Plan:  Janyla was seen today for pain management.  Other chronic pain -     oxyCODONE  HCl; Take 1 tablet (20 mg total) by mouth in the morning, at noon, in the evening, and  at bedtime. As needed for pain  Dispense: 120 tablet; Refill: 0  Type 2 diabetes mellitus with stage 3a chronic kidney disease, with long-term current use of insulin  (HCC) -     POC CREATINE & ALBUMIN ,URINE; Future  Obesity (BMI 30-39.9)  Mixed hyperlipidemia  Seasonal allergic rhinitis due to pollen -     Azelastine  HCl; Place 1 spray into the nose daily.  Dispense: 30 mL; Refill: 2  Primary hypertension -     Potassium Chloride  ER; Take 1 tablet (10 mEq total) by mouth every morning.  Dispense: 90 tablet; Refill: 1    Problem List Items Addressed This Visit       Cardiovascular and Mediastinum   HTN (hypertension)   Relevant Medications   potassium chloride  (KLOR-CON ) 10 MEQ tablet     Endocrine   Type II diabetes mellitus with renal manifestations (HCC)   Relevant Orders   POC CREATINE & ALBUMIN ,URINE     Other   Other chronic pain - Primary   Relevant Medications   Oxycodone  HCl 20 MG TABS   HLD (hyperlipidemia)   Obesity (BMI 30-39.9)   Other Visit Diagnoses       Seasonal allergic rhinitis due to pollen       Relevant Medications   Azelastine  HCl 137 MCG/SPRAY SOLN       Return in about 1 month (around 01/02/2025) for Pain Management.   Total time spent: 20 minutes. This time includes review of previous notes and results and patient face to face interaction during today'Yalena Colon visit.    Sherrill Cinderella Perry, MD  12/02/2024   This document may have been prepared by [facility] Voice Recognition software and as such may include unintentional dictation errors.     [1]  Allergies Allergen Reactions   Morphine  Hives   Iodinated Contrast Media Hives   Aspirin Hives    Noted on MD progress notes and discussed with MD 09/02/15   Etodolac Hives   Ibuprofen Hives   Shellfish Allergy Hives   Tylenol  [Acetaminophen ] Hives    Upset stomach   [2]  Outpatient Medications Prior to Visit  Medication Sig   albuterol  (VENTOLIN  HFA) 108 (90 Base) MCG/ACT inhaler  Inhale 2 puffs into the lungs every 4 (four) hours as needed for wheezing or shortness of breath.   ALPRAZolam  (XANAX ) 0.25 MG tablet Take 1 tablet (0.25 mg total) by mouth 3 (three) times daily as needed for anxiety.  apixaban  (ELIQUIS ) 5 MG TABS tablet Take 1 tablet (5 mg total) by mouth 2 (two) times daily.   atorvastatin  (LIPITOR) 40 MG tablet TAKE 1 TABLET BY MOUTH AT BEDTIME   baclofen  (LIORESAL ) 10 MG tablet TAKE 1 TABLET BY MOUTH 3 TIMES A DAY   Calcium  Carbonate-Vitamin D  (OYSTER SHELL CALCIUM  500 + D PO) Take 1 tablet by mouth daily.   cetirizine  (ZYRTEC  ALLERGY) 10 MG tablet Take 1 tablet (10 mg total) by mouth daily.   cloNIDine  (CATAPRES ) 0.1 MG tablet TAKE 1 TABLET BY MOUTH AT BEDTIME   Daridorexant  HCl (QUVIVIQ ) 25 MG TABS Take 25 mg by mouth at bedtime. Take one tablet by mouth once daily at bedtime (Patient taking differently: Take 50 mg by mouth at bedtime.)   diclofenac  Sodium (VOLTAREN ) 1 % GEL APPLY 4 GRAMS TOPICALLY TO NECK TWICE DAILY (Patient taking differently: Apply 2 g topically daily as needed (Neck and back pain).)   empagliflozin  (JARDIANCE ) 25 MG TABS tablet Take 1 tablet (25 mg total) by mouth daily.   ferrous sulfate  (FEROSUL) 325 (65 FE) MG tablet Take 1 tablet (325 mg total) by mouth 2 (two) times daily.   fluticasone  (FLONASE ) 50 MCG/ACT nasal spray Place 1 spray into both nostrils daily. (Patient taking differently: Place 1 spray into both nostrils at bedtime.)   folic acid  (FOLVITE ) 1 MG tablet TAKE 1 TABLET BY MOUTH DAILY   glucose blood (ONETOUCH VERIO) test strip TEST TWICE A DAY   hydrOXYzine  (ATARAX ) 25 MG tablet Take 1 tablet (25 mg total) by mouth 3 (three) times daily.   Insulin  Pen Needle (B-D UF III MINI PEN NEEDLES) 31G X 5 MM MISC USE WITH DEVICE TO INJECT LEVEMIR  NIGHTLY   meclizine  (ANTIVERT ) 25 MG tablet TAKE 1 TABLET BY MOUTH 3 TIMES A DAY   mirtazapine  (REMERON ) 45 MG tablet Take 1 tablet (45 mg total) by mouth at bedtime.   MOUNJARO  10  MG/0.5ML Pen INJECT 10 MG UNDER THE SKIN ONCE WEEKLY   naloxone  (NARCAN ) nasal spray 4 mg/0.1 mL FOR SUSPECTED OPIOID OVERDOSE, ADMINISTER 1 SPRAY INTO ONE NOSTRIL. REPEAT IN OTHER NOSTRIL USING A NEW DEVICE AFTER 2-3 MINUTES IF NO OR MINIMAL RESPONSE. CALL 911 IF USED   NEXIUM  40 MG capsule Take 1 capsule (40 mg total) by mouth 2 (two) times daily.   nystatin  cream (MYCOSTATIN ) Apply 1 Application topically 2 (two) times daily. (Patient taking differently: Apply 1 Application topically 2 (two) times daily as needed (Rash).)   oxybutynin  (DITROPAN ) 5 MG tablet Take 1 tablet (5 mg total) by mouth 2 (two) times daily.   oxyCODONE  (ROXICODONE ) 5 MG immediate release tablet Take 2 tablets (10 mg total) by mouth every 8 (eight) hours as needed for breakthrough pain.   OXYGEN  Inhale 2 L into the lungs at bedtime.   polyethylene glycol (MIRALAX  / GLYCOLAX ) 17 g packet Take 17 g by mouth daily as needed for mild constipation.   TRESIBA  FLEXTOUCH 200 UNIT/ML FlexTouch Pen INJECT 28 UNITS UNDER THE SKIN EVERY MORNING (Patient taking differently: 28 Units every morning.)   UBRELVY  100 MG TABS Take 1 tablet (100 mg total) by mouth as needed.   [DISCONTINUED] Azelastine  HCl 137 MCG/SPRAY SOLN Place 1 spray into the nose daily. (Patient taking differently: Place 1 spray into both nostrils at bedtime.)   [DISCONTINUED] Oxycodone  HCl 20 MG TABS Take 1 tablet (20 mg total) by mouth in the morning, at noon, in the evening, and at bedtime. As needed for pain   [  DISCONTINUED] potassium chloride  (KLOR-CON ) 10 MEQ tablet Take 1 tablet (10 mEq total) by mouth daily. (Patient taking differently: Take 10 mEq by mouth every morning.)   No facility-administered medications prior to visit.   "

## 2024-12-04 LAB — OXYCODONES,MS,WB/SP RFX

## 2024-12-04 LAB — DRUG SCREEN 13 W/CONF , SERUM
Amphetamines, IA: NEGATIVE ng/mL
Barbiturates, IA: NEGATIVE ug/mL
Benzodiazepines, IA: NEGATIVE ng/mL
Cocaine & Metabolite, IA: NEGATIVE ng/mL
FENTANYL, IA: NEGATIVE ng/mL
MEPERIDINE, IA: NEGATIVE ng/mL
Methadone, IA: NEGATIVE ng/mL
Opiates, IA: NEGATIVE ng/mL
Phencyclidine, IA: NEGATIVE ng/mL
Propoxyphene, IA: NEGATIVE ng/mL
THC(Marijuana) Metabolite, IA: NEGATIVE ng/mL
TRAMADOL, IA: NEGATIVE ng/mL

## 2024-12-08 ENCOUNTER — Ambulatory Visit: Payer: Self-pay | Admitting: Internal Medicine

## 2024-12-09 ENCOUNTER — Telehealth: Payer: Self-pay

## 2024-12-09 NOTE — Telephone Encounter (Signed)
 Patient needs to be called and she must come Thursday for a Drug Screen Redraw when I am in office. No exceptions, if she does not this will count as a contract violation and they will be dismissed from the pain clinic.

## 2024-12-09 NOTE — Telephone Encounter (Signed)
 Patient informed and will come thursday

## 2024-12-09 NOTE — Telephone Encounter (Signed)
 Pt needs to drink lots of water

## 2024-12-11 ENCOUNTER — Other Ambulatory Visit

## 2024-12-11 DIAGNOSIS — G8929 Other chronic pain: Secondary | ICD-10-CM

## 2024-12-12 ENCOUNTER — Telehealth: Payer: Self-pay | Admitting: Neurosurgery

## 2024-12-12 DIAGNOSIS — G8929 Other chronic pain: Secondary | ICD-10-CM

## 2024-12-12 MED ORDER — OXYCODONE HCL 5 MG PO TABS: 5.0000 mg | ORAL_TABLET | Freq: Two times a day (BID) | ORAL | 0 refills | Status: AC | PRN

## 2024-12-12 NOTE — Telephone Encounter (Signed)
 Patient notified and will pick up medication

## 2024-12-12 NOTE — Telephone Encounter (Signed)
 I spoke with the patient she stated she is in a lot of pain in her low back and not being able to sleep at night due to the pain.  She is currently taking the following:  Oxycodone  20mg , 4 times a day Baclofen  3 times a day   She did have a prescription of the Oxycodone  5mg , take 2 pills every 8 hours for breakthrough pain, she stated that this was helping with her pain and is now out of this. Can a refill of the oxycodone  5mg  be sent in to [facility]?

## 2024-12-12 NOTE — Telephone Encounter (Signed)
 Patient reports no improvement in their back pain. They state it feels as though their legs may give out and that they could fall at any time. They report that no treatments have provided relief and deny starting any new medications. The patient is asking if there is anything they can do to help manage symptoms until their appointment next week.

## 2024-12-12 NOTE — Telephone Encounter (Signed)
 DOS:  11/06/24 successful placement of a replacement spinal cord stimulator IPG.   Can do limited refill of oxycodone  5mg . Will change directions to 1-2 every 12 hours prn severe pain refractory to chronic oxycodone . Can discuss with Dr. Claudene at her follow up, but this will likely be last refill   PMP reviewed and is appropriate. Please let her know.

## 2024-12-17 ENCOUNTER — Encounter: Payer: Self-pay | Admitting: Neurosurgery

## 2024-12-17 ENCOUNTER — Ambulatory Visit: Admitting: Neurosurgery

## 2024-12-17 VITALS — BP 138/76 | Temp 98.7°F | Ht 67.0 in | Wt 238.0 lb

## 2024-12-17 DIAGNOSIS — M961 Postlaminectomy syndrome, not elsewhere classified: Secondary | ICD-10-CM

## 2024-12-17 DIAGNOSIS — G894 Chronic pain syndrome: Secondary | ICD-10-CM

## 2024-12-17 NOTE — Progress Notes (Signed)
" ° °  HISTORY OF PRESENT ILLNESS: Discussed the use of AI scribe software for clinical note transcription with the patient, who gave verbal consent to proceed.  History of Present Illness Lindsey [name] is a 64 year old female with prior lumbar spinal fusion and spinal cord stimulator placement who presents for evaluation of persistent, severe axial back pain and neurostimulator management.  She has severe, persistent axial low back pain in the midline and right paraspinal region. Pain worsens with walking, standing, and forward flexion and causes a catching sensation with difficulty straightening. Severity limits her to brief indoor walking and she prefers to remain seated due to pain.  Pain radiates into the legs. She has had some difficulty with working with her programming.   She uses opioid analgesics every eight hours as directed for breakthrough pain. Relief lasts about 1.5 to 2 hours before sharp pain recurs. She has not had recent spinal injections or blocks and is willing to pursue additional interventional pain options.  Spinal cord stimulator trial was performed in December 2011 with permanent placement in 2012. Battery was replaced recently. She is unsure if the current system is MRI compatible due to possible extension leads and does not recall recent spine imaging. Her lumbar fusion was done many years ago.   PHYSICAL EXAMINATION:   Vitals:   12/17/24 1036  BP: 138/76  Temp: 98.7 F (37.1 C)   General: Patient is well developed, well nourished, calm, collected, and in no apparent distress.  NEUROLOGICAL:  General: In no acute distress.  Awake, alert, oriented to person, place, and time. Pupils equal round and reactive to light.   Incision c/d/I, some glue remaining  ROS (Neurologic): Negative except as noted above  IMAGING: No interval imaging to review   Assessment & Plan Chronic pain syndrome She experiences severe, chronic axial back pain, predominantly midline  and right-sided, refractory to spinal cord stimulator therapy and opioid analgesics. Pain remains intractable with significant functional impairment and minimal relief from current interventions. - Referred to pain management for evaluation and consideration of medial branch blocks or joint injections for axial back pain. - Reviewed pain medication regimen; confirmed ongoing oxycodone  use for breakthrough pain.  Failed back surgical syndrome Persistent severe back pain following prior spinal fusion and spinal cord stimulator placement, consistent with failed back surgical syndrome. Concern for progression of spinal pathology, including adjacent segment disease or further spondylolisthesis, potentially contributing to symptoms. - Will order MRI of the spine, if feasible, to assess for progression of degenerative changes and evaluate spinal hardware and adjacent segments. Awaiting follow up from rep tech support for MRI compatabilty  Spinal cord stimulator management Status post battery replacement for spinal cord stimulator. Reports minimal to no pain relief with the device and cannot distinguish pain difference with device on or off. Device programming and patient interface reviewed. Device explantation not recommended due to lack of evidence for device-related pain and risks including potential neurological injury. - Advised against device explantation due to risk of neurological injury and lack of evidence for device-related pain. Working with rep currently for titration.    Penne MICAEL Sharps, MD/MS Department of Neurosurgery  "

## 2024-12-18 LAB — DRUG SCREEN 13 W/CONF , SERUM
Amphetamines, IA: NEGATIVE ng/mL
Barbiturates, IA: NEGATIVE ug/mL
Benzodiazepines, IA: NEGATIVE ng/mL
Cocaine & Metabolite, IA: NEGATIVE ng/mL
FENTANYL, IA: NEGATIVE ng/mL
MEPERIDINE, IA: NEGATIVE ng/mL
Methadone, IA: NEGATIVE ng/mL
Opiates, IA: NEGATIVE ng/mL
Oxycodones, IA: POSITIVE ng/mL — AB
Phencyclidine, IA: NEGATIVE ng/mL
Propoxyphene, IA: NEGATIVE ng/mL
THC(Marijuana) Metabolite, IA: NEGATIVE ng/mL
TRAMADOL, IA: NEGATIVE ng/mL

## 2024-12-18 LAB — OXYCODONES,MS,WB/SP RFX
Oxycocone: 11.1 ng/mL
Oxycodones Confirmation: POSITIVE
Oxymorphone: NEGATIVE ng/mL

## 2024-12-19 ENCOUNTER — Ambulatory Visit: Payer: Self-pay | Admitting: Internal Medicine

## 2024-12-28 ENCOUNTER — Other Ambulatory Visit: Payer: Self-pay | Admitting: Internal Medicine

## 2024-12-28 DIAGNOSIS — J301 Allergic rhinitis due to pollen: Secondary | ICD-10-CM

## 2024-12-30 ENCOUNTER — Other Ambulatory Visit: Payer: Self-pay

## 2025-01-02 ENCOUNTER — Ambulatory Visit: Admitting: Internal Medicine

## 2025-01-02 VITALS — BP 148/90 | HR 68 | Ht 67.0 in | Wt 236.0 lb

## 2025-01-02 DIAGNOSIS — G8929 Other chronic pain: Secondary | ICD-10-CM

## 2025-01-02 DIAGNOSIS — Z794 Long term (current) use of insulin: Secondary | ICD-10-CM

## 2025-01-02 DIAGNOSIS — I10 Essential (primary) hypertension: Secondary | ICD-10-CM

## 2025-01-02 DIAGNOSIS — E782 Mixed hyperlipidemia: Secondary | ICD-10-CM

## 2025-01-02 MED ORDER — TRESIBA FLEXTOUCH 200 UNIT/ML ~~LOC~~ SOPN: 24.0000 [IU] | PEN_INJECTOR | SUBCUTANEOUS | 2 refills | Status: AC

## 2025-01-02 MED ORDER — OXYCODONE HCL 20 MG PO TABS: 20.0000 mg | ORAL_TABLET | Freq: Four times a day (QID) | ORAL | 0 refills | Status: AC

## 2025-01-02 NOTE — Progress Notes (Signed)
 "  Established Patient Office Visit  Subjective:  Patient ID: Lindsey [name], female    DOB: [birth year]  Age: 64 y.o. MRN: [id number]  Chief Complaint  Patient presents with   Pain Management    Pain management     Here for pain management follow up. Chronic pain not fully controlled on current analgesia. Last drug screen satisfactory and pill counts have also been satisfactory. Neurosurgery declined to extract her spinal stimulator and has been referred to pain management for epidural injections.     No other concerns at this time.   Past Medical History:  Diagnosis Date   Allergy    Anemia    Anxiety    Arthritis    knees   Battery end of life of spinal cord stimulator    CHF (congestive heart failure) (HCC)    COPD (chronic obstructive pulmonary disease) (HCC)    Depression    DM (diabetes mellitus), type 2 (HCC)    GERD (gastroesophageal reflux disease)    Heart attack (HCC) 2002   Hyperlipidemia    Hypertension    Insomnia    PUD (peptic ulcer disease)    Pulmonary embolism (HCC)    Sleep apnea    can't tolerate CPAP.  Uses O2 only   Spinal cord stimulator status    for lower back pain   Stroke [facility]) 2004   mini-stroke-left sided weakness   Vertigo    Wears dentures    full upper and lower    Past Surgical History:  Procedure Laterality Date   ABDOMINAL HYSTERECTOMY     back surgery     broken wrist     COLONOSCOPY WITH PROPOFOL  N/A 01/30/2017   Procedure: COLONOSCOPY WITH PROPOFOL ;  Surgeon: Ruel Kung, MD;  Location: [facility] ENDOSCOPY;  Service: Endoscopy;  Laterality: N/A;   COLONOSCOPY WITH PROPOFOL  N/A 02/20/2017   Procedure: Colonoscopy with propofol  ;  Surgeon: Ruel Kung, MD;  Location: [facility] ENDOSCOPY;  Service: Endoscopy;  Laterality: N/A;   COLONOSCOPY WITH PROPOFOL  N/A 03/13/2017   Procedure: COLONOSCOPY WITH PROPOFOL ;  Surgeon: Ruel Kung, MD;  Location: [facility] ENDOSCOPY;  Service: Endoscopy;  Laterality: N/A;   COLONOSCOPY WITH PROPOFOL  N/A  04/17/2017   Procedure: COLONOSCOPY WITH PROPOFOL ;  Surgeon: Kung Ruel, MD;  Location: [facility] ENDOSCOPY;  Service: Endoscopy;  Laterality: N/A;   ESOPHAGOGASTRODUODENOSCOPY N/A 05/05/2020   Procedure: ESOPHAGOGASTRODUODENOSCOPY (EGD);  Surgeon: Toledo, Ladell POUR, MD;  Location: [facility] ENDOSCOPY;  Service: Gastroenterology;  Laterality: N/A;   ESOPHAGOGASTRODUODENOSCOPY (EGD) WITH PROPOFOL  N/A 09/28/2016   Procedure: ESOPHAGOGASTRODUODENOSCOPY (EGD) WITH PROPOFOL ;  Surgeon: Lamar ONEIDA Holmes, MD;  Location: [facility] ENDOSCOPY;  Service: Endoscopy;  Laterality: N/A;   ESOPHAGOGASTRODUODENOSCOPY (EGD) WITH PROPOFOL  N/A 03/13/2017   Procedure: ESOPHAGOGASTRODUODENOSCOPY (EGD) WITH PROPOFOL ;  Surgeon: Ruel Kung, MD;  Location: [facility] ENDOSCOPY;  Service: Endoscopy;  Laterality: N/A;   ESOPHAGOGASTRODUODENOSCOPY (EGD) WITH PROPOFOL  N/A 01/07/2024   Procedure: ESOPHAGOGASTRODUODENOSCOPY (EGD) WITH PROPOFOL ;  Surgeon: Jinny Carmine, MD;  Location: [facility] ENDOSCOPY;  Service: Endoscopy;  Laterality: N/A;   GALLBLADDER SURGERY     GASTRIC BYPASS     GIVENS CAPSULE STUDY N/A 03/13/2017   Procedure: GIVENS CAPSULE STUDY;  Surgeon: Ruel Kung, MD;  Location: [facility] ENDOSCOPY;  Service: Endoscopy;  Laterality: N/A;   HERNIA REPAIR     JOINT REPLACEMENT  1995   REPLACEMENT TOTAL KNEE     ROTATOR CUFF REPAIR     SPINAL CORD STIMULATOR BATTERY EXCHANGE Right 11/06/2024   Procedure: Spinal cord stimulator battery exchange, right sided.;  Surgeon: Claudene Penne ORN, MD;  Location: [facility] ORS;  Service: Neurosurgery;  Laterality: Right;   SPINAL CORD STIMULATOR INSERTION      Social History   Socioeconomic History   Marital status: Widowed    Spouse name: Not on file   Number of children: Not on file   Years of education: Not on file   Highest education level: Not on file  Occupational History   Occupation: disabled  Tobacco Use   Smoking status: Never   Smokeless tobacco: Never  Vaping Use   Vaping status: Never  Used  Substance and Sexual Activity   Alcohol use: No    Alcohol/week: 0.0 standard drinks of alcohol   Drug use: No   Sexual activity: Yes  Other Topics Concern   Not on file  Social History Narrative   Not on file   Social Drivers of Health   Tobacco Use: Low Risk (12/17/2024)   Patient History    Smoking Tobacco Use: Never    Smokeless Tobacco Use: Never    Passive Exposure: Not on file  Financial Resource Strain: Low Risk (07/20/2022)   Received from [facility]   Overall Financial Resource Strain (CARDIA)    Difficulty of Paying Living Expenses: Not hard at all  Food Insecurity: No Food Insecurity (07/20/2022)   Received from [facility]   Epic    Within the past 12 months, you worried that your food would run out before you got the money to buy more.: Never true    Within the past 12 months, the food you bought just didn't last and you didn't have money to get more.: Never true  Transportation Needs: No Transportation Needs (07/20/2022)   Received from [facility]   PRAPARE - Transportation    Lack of Transportation (Medical): No    Lack of Transportation (Non-Medical): No  Physical Activity: Not on file  Stress: Not on file  Social Connections: Not on file  Intimate Partner Violence: Not on file  Depression (PHQ2-9): Medium Risk (11/01/2023)   Depression (PHQ2-9)    PHQ-2 Score: 6  Alcohol Screen: Not on file  Housing: Not on file  Utilities: Not At Risk (10/29/2023)   [facility] Utilities    Threatened with loss of utilities: No  Health Literacy: Not on file    Family History  Problem Relation Age of Onset   Hypertension Father    COPD Father    Heart disease Father    Breast cancer Neg Hx     Allergies[1]  Show/hide medication list[2]  Review of Systems  Constitutional:  Positive for weight loss (2 lbs).       Objective:   BP (!) 148/90   Pulse 68   Ht 5' 7 (1.702 m)   Wt 236 lb (107 kg)   SpO2 98%   BMI 36.96 kg/m   Vitals:    01/02/25 1055  BP: (!) 148/90  Pulse: 68  Height: 5' 7 (1.702 m)  Weight: 236 lb (107 kg)  SpO2: 98%  BMI (Calculated): 36.95    Physical Exam Vitals reviewed.  Constitutional:      General: She is not in acute distress.    Appearance: She is obese.  HENT:     Head: Normocephalic.     Nose: Nose normal.     Mouth/Throat:     Mouth: Mucous membranes are moist.  Eyes:     Extraocular Movements: Extraocular movements intact.     Pupils: Pupils are  equal, round, and reactive to light.  Cardiovascular:     Rate and Rhythm: Normal rate and regular rhythm.     Heart sounds: No murmur heard. Pulmonary:     Effort: Pulmonary effort is normal.     Breath sounds: No rhonchi or rales.  Abdominal:     General: Abdomen is flat.     Palpations: There is no hepatomegaly, splenomegaly or mass.  Musculoskeletal:        General: Normal range of motion.     Cervical back: Normal range of motion. No tenderness.  Skin:    General: Skin is warm and dry.  Neurological:     General: No focal deficit present.     Mental Status: She is alert and oriented to person, place, and time.     Cranial Nerves: No cranial nerve deficit.     Motor: No weakness.  Psychiatric:        Mood and Affect: Mood normal.        Behavior: Behavior normal.      No results found for any visits on 01/02/25.  Recent Results (from the past 2160 hours)  Surgical pcr screen     Status: None   Collection Time: 10/30/24  9:06 AM   Specimen: Nasal Mucosa; Nasal Swab  Result Value Ref Range   MRSA, PCR NEGATIVE NEGATIVE   Staphylococcus aureus NEGATIVE NEGATIVE    Comment: (NOTE) The Xpert SA Assay (FDA approved for NASAL specimens in patients 54 years of age and older), is one component of a comprehensive surveillance program. It is not intended to diagnose infection nor to guide or monitor treatment. Performed at [facility], [address] [place], [place] [address]   [place], [place] [address]     Status:  Abnormal   Collection Time: 11/06/24  7:09 AM  Result Value Ref Range   Sodium 143 135 - 145 mmol/L   Potassium 4.9 3.5 - 5.1 mmol/L   Chloride 116 (H) 98 - 111 mmol/L   BUN 31 (H) 8 - 23 mg/dL   Creatinine, Ser 8.89 (H) 0.44 - 1.00 mg/dL   Glucose, Bld 823 (H) 70 - 99 mg/dL    Comment: Glucose reference range applies only to samples taken after fasting for at least 8 hours.   Calcium , Ion 1.05 (L) 1.15 - 1.40 mmol/L   TCO2 20 (L) 22 - 32 mmol/L   Hemoglobin 13.9 12.0 - 15.0 g/dL   HCT 58.9 63.9 - 53.9 %  Glucose, capillary     Status: Abnormal   Collection Time: 11/06/24  8:22 AM  Result Value Ref Range   Glucose-Capillary 208 (H) 70 - 99 mg/dL    Comment: Glucose reference range applies only to samples taken after fasting for at least 8 hours.  Drug Screen 13 with reflex Confirmation (AMP,BAR,BZO,COC,PCP,THC,OPI,OXY,MD,FEN,MEP,PPX,TRAM), Serum     Status: None   Collection Time: 12/01/24 10:58 AM  Result Value Ref Range   Amphetamines, IA Negative Cutoff:50 ng/mL   Barbiturates, IA Negative Cutoff:0.1 ug/mL   Benzodiazepines, IA Negative Cutoff:20 ng/mL   Cocaine & Metabolite, IA Negative Cutoff:25 ng/mL   Phencyclidine, IA Negative Cutoff:8 ng/mL   THC(Marijuana) Metabolite, IA Negative Cutoff:5 ng/mL   Opiates, IA Negative Cutoff:5 ng/mL   Oxycodones, IA CANCELED ng/mL    Comment: Test not performed Insufficient sample volume to complete testing.  Result canceled by the ancillary.    Methadone, IA Negative Cutoff:25 ng/mL   FENTANYL , IA Negative Cutoff:1.0 ng/mL   Propoxyphene, IA  Negative Cutoff:50 ng/mL   MEPERIDINE, IA Negative Cutoff:100 ng/mL   TRAMADOL , IA Negative Cutoff:50 ng/mL  Oxycodones,MS,WB/Sp Rfx     Status: None   Collection Time: 12/01/24 10:58 AM  Result Value Ref Range   Oxycodones Confirmation CANCELED     Comment: Test not performed Insufficient specimen volume received to perform analysis.  Result canceled by the ancillary.    Oxycocone  CANCELED ng/mL    Comment: Test not performed Insufficient specimen volume received to perform analysis.  Result canceled by the ancillary.    Oxymorphone CANCELED ng/mL    Comment: Test not performed Insufficient specimen volume received to perform analysis.  Result canceled by the ancillary.   Drug Screen 13 with reflex Confirmation (AMP,BAR,BZO,COC,PCP,THC,OPI,OXY,MD,FEN,MEP,PPX,TRAM), Serum     Status: Abnormal   Collection Time: 12/11/24 11:10 AM  Result Value Ref Range   Amphetamines, IA Negative Cutoff:50 ng/mL   Barbiturates, IA Negative Cutoff:0.1 ug/mL   Benzodiazepines, IA Negative Cutoff:20 ng/mL   Cocaine & Metabolite, IA Negative Cutoff:25 ng/mL   Phencyclidine, IA Negative Cutoff:8 ng/mL   THC(Marijuana) Metabolite, IA Negative Cutoff:5 ng/mL   Opiates, IA Negative Cutoff:5 ng/mL   Oxycodones, IA ++POSITIVE++ (A) Cutoff:5 ng/mL   Methadone, IA Negative Cutoff:25 ng/mL   FENTANYL , IA Negative Cutoff:1.0 ng/mL   Propoxyphene, IA Negative Cutoff:50 ng/mL   MEPERIDINE, IA Negative Cutoff:100 ng/mL   TRAMADOL , IA Negative Cutoff:50 ng/mL    Comment: This test was developed and its performance characteristics determined by [facility].  It has not been cleared or approved by the Food and Drug Administration.   Oxycodones,MS,WB/Sp Rfx     Status: None   Collection Time: 12/11/24 11:10 AM  Result Value Ref Range   Oxycodones Confirmation Positive    Oxycocone 11.1 ng/mL   Oxymorphone Negative ng/mL    Comment: Expected metabolism of oxycodone  class drugs:   Parent Drug       Detected Metabolites  -----------       --------------------  Oxycodone :        Oxymorphone  Oxymorphone:      None Confirmation threshold: 1.0 ng/mL       Assessment & Plan:  Anabell was seen today for pain management.  Type 2 diabetes mellitus with stage 3a chronic kidney disease, with long-term current use of insulin  (HCC) -     Hemoglobin A1c -     Tresiba  FlexTouch; Inject 24 Units  into the skin daily.  Dispense: 3.6 mL; Refill: 2  Other chronic pain -     oxyCODONE  HCl; Take 1 tablet (20 mg total) by mouth in the morning, at noon, in the evening, and at bedtime. As needed for pain  Dispense: 120 tablet; Refill: 0  Mixed hyperlipidemia -     Comprehensive metabolic panel with GFR -     Lipid panel  Primary hypertension -     CBC With Diff/Platelet    Problem List Items Addressed This Visit       Cardiovascular and Mediastinum   HTN (hypertension)   Relevant Orders   CBC With Diff/Platelet     Endocrine   Type II diabetes mellitus with renal manifestations (HCC) - Primary   Relevant Medications   TRESIBA  FLEXTOUCH 200 UNIT/ML FlexTouch Pen   Other Relevant Orders   Hemoglobin A1c     Other   Other chronic pain   Relevant Medications   Oxycodone  HCl 20 MG TABS   HLD (hyperlipidemia)   Relevant Orders   Comprehensive metabolic panel with GFR  Lipid panel    Return in about 2 weeks (around 01/16/2025) for awv with labs prior.   Total time spent: 20 minutes. This time includes review of previous notes and results and patient face to face interaction during today'Lyell Clugston visit.    Sherrill Cinderella Perry, MD  01/02/2025   This document may have been prepared by [facility] Voice Recognition software and as such may include unintentional dictation errors.     [1]  Allergies Allergen Reactions   Morphine  Hives   Iodinated Contrast Media Hives   Aspirin Hives    Noted on MD progress notes and discussed with MD 09/02/15   Etodolac Hives   Ibuprofen Hives   Shellfish Allergy Hives   Tylenol  [Acetaminophen ] Hives    Upset stomach   [2]  Outpatient Medications Prior to Visit  Medication Sig   ALPRAZolam  (XANAX ) 0.25 MG tablet Take 1 tablet (0.25 mg total) by mouth 3 (three) times daily as needed for anxiety.   apixaban  (ELIQUIS ) 5 MG TABS tablet Take 1 tablet (5 mg total) by mouth 2 (two) times daily.   atorvastatin  (LIPITOR) 40 MG tablet TAKE 1 TABLET  BY MOUTH AT BEDTIME   Azelastine  HCl 137 MCG/SPRAY SOLN Place 1 spray into the nose daily.   baclofen  (LIORESAL ) 10 MG tablet TAKE 1 TABLET BY MOUTH 3 TIMES A DAY   Calcium  Carbonate-Vitamin D  (OYSTER SHELL CALCIUM  500 + D PO) Take 1 tablet by mouth daily.   cetirizine  (ZYRTEC ) 10 MG tablet TAKE 1 TABLET BY MOUTH DAILY   cloNIDine  (CATAPRES ) 0.1 MG tablet TAKE 1 TABLET BY MOUTH AT BEDTIME   empagliflozin  (JARDIANCE ) 25 MG TABS tablet Take 1 tablet (25 mg total) by mouth daily.   ferrous sulfate  (FEROSUL) 325 (65 FE) MG tablet Take 1 tablet (325 mg total) by mouth 2 (two) times daily.   glucose blood (ONETOUCH VERIO) test strip TEST TWICE A DAY   hydrOXYzine  (ATARAX ) 25 MG tablet Take 1 tablet (25 mg total) by mouth 3 (three) times daily.   Insulin  Pen Needle (B-D UF III MINI PEN NEEDLES) 31G X 5 MM MISC USE WITH DEVICE TO INJECT LEVEMIR  NIGHTLY   mirtazapine  (REMERON ) 45 MG tablet Take 1 tablet (45 mg total) by mouth at bedtime.   MOUNJARO  10 MG/0.5ML Pen INJECT 10 MG UNDER THE SKIN ONCE WEEKLY   naloxone  (NARCAN ) nasal spray 4 mg/0.1 mL FOR SUSPECTED OPIOID OVERDOSE, ADMINISTER 1 SPRAY INTO ONE NOSTRIL. REPEAT IN OTHER NOSTRIL USING A NEW DEVICE AFTER 2-3 MINUTES IF NO OR MINIMAL RESPONSE. CALL 911 IF USED   NEXIUM  40 MG capsule Take 1 capsule (40 mg total) by mouth 2 (two) times daily.   oxybutynin  (DITROPAN ) 5 MG tablet TAKE 1 TABLET BY MOUTH 2 TIMES A DAY   OXYGEN  Inhale 2 L into the lungs at bedtime.   polyethylene glycol (MIRALAX  / GLYCOLAX ) 17 g packet Take 17 g by mouth daily as needed for mild constipation.   potassium chloride  (KLOR-CON ) 10 MEQ tablet Take 1 tablet (10 mEq total) by mouth every morning.   UBRELVY  100 MG TABS Take 1 tablet (100 mg total) by mouth as needed.   albuterol  (VENTOLIN  HFA) 108 (90 Base) MCG/ACT inhaler Inhale 2 puffs into the lungs every 4 (four) hours as needed for wheezing or shortness of breath.   Daridorexant  HCl (QUVIVIQ ) 25 MG TABS Take 25 mg by mouth  at bedtime. Take one tablet by mouth once daily at bedtime (Patient taking differently: Take 50 mg by mouth  at bedtime.)   diclofenac  Sodium (VOLTAREN ) 1 % GEL APPLY 4 GRAMS TOPICALLY TO NECK TWICE DAILY (Patient taking differently: Apply 2 g topically daily as needed (Neck and back pain).)   fluticasone  (FLONASE ) 50 MCG/ACT nasal spray Place 1 spray into both nostrils daily. (Patient taking differently: Place 1 spray into both nostrils at bedtime.)   folic acid  (FOLVITE ) 1 MG tablet TAKE 1 TABLET BY MOUTH DAILY   meclizine  (ANTIVERT ) 25 MG tablet TAKE 1 TABLET BY MOUTH 3 TIMES A DAY   nystatin  cream (MYCOSTATIN ) Apply 1 Application topically 2 (two) times daily. (Patient taking differently: Apply 1 Application topically 2 (two) times daily as needed (Rash).)   oxyCODONE  (ROXICODONE ) 5 MG immediate release tablet Take 1-2 tablets (5-10 mg total) by mouth every 12 (twelve) hours as needed for breakthrough pain (refractory to chronic/home oxycodone  dose).   [DISCONTINUED] Oxycodone  HCl 20 MG TABS Take 1 tablet (20 mg total) by mouth in the morning, at noon, in the evening, and at bedtime. As needed for pain   [DISCONTINUED] TRESIBA  FLEXTOUCH 200 UNIT/ML FlexTouch Pen INJECT 28 UNITS UNDER THE SKIN EVERY MORNING (Patient taking differently: 28 Units every morning.)   No facility-administered medications prior to visit.   "

## 2025-01-20 ENCOUNTER — Ambulatory Visit: Admitting: Student in an Organized Health Care Education/Training Program

## 2025-01-20 ENCOUNTER — Ambulatory Visit: Admitting: Internal Medicine

## 2025-01-30 ENCOUNTER — Encounter: Admitting: Internal Medicine
